# Patient Record
Sex: Female | Born: 1955
Health system: Southern US, Community
[De-identification: ages and names within clinical notes are randomized; demographics above are authoritative.]

## PROBLEM LIST (undated history)

## (undated) DIAGNOSIS — I639 Cerebral infarction, unspecified: Secondary | ICD-10-CM

## (undated) DIAGNOSIS — G8929 Other chronic pain: Secondary | ICD-10-CM

## (undated) DIAGNOSIS — I10 Essential (primary) hypertension: Secondary | ICD-10-CM

## (undated) DIAGNOSIS — J302 Other seasonal allergic rhinitis: Secondary | ICD-10-CM

## (undated) DIAGNOSIS — Z862 Personal history of diseases of the blood and blood-forming organs and certain disorders involving the immune mechanism: Secondary | ICD-10-CM

## (undated) DIAGNOSIS — M549 Dorsalgia, unspecified: Secondary | ICD-10-CM

## (undated) HISTORY — PX: COLONOSCOPY: SHX174

## (undated) HISTORY — DX: Personal history of diseases of the blood and blood-forming organs and certain disorders involving the immune mechanism: Z86.2

## (undated) HISTORY — PX: LYMPH NODE BIOPSY: SHX201

## (undated) HISTORY — PX: FOOT SURGERY: SHX648

---

## 2004-05-22 ENCOUNTER — Emergency Department (HOSPITAL_COMMUNITY): Admission: EM | Admit: 2004-05-22 | Discharge: 2004-05-22 | Payer: Self-pay | Admitting: Emergency Medicine

## 2004-12-16 ENCOUNTER — Ambulatory Visit (HOSPITAL_COMMUNITY): Admission: RE | Admit: 2004-12-16 | Discharge: 2004-12-16 | Payer: Self-pay | Admitting: Family Medicine

## 2005-01-07 ENCOUNTER — Ambulatory Visit: Payer: Self-pay | Admitting: Internal Medicine

## 2005-01-27 ENCOUNTER — Ambulatory Visit: Payer: Self-pay | Admitting: Family Medicine

## 2005-01-29 ENCOUNTER — Ambulatory Visit: Payer: Self-pay | Admitting: Family Medicine

## 2006-12-19 ENCOUNTER — Emergency Department (HOSPITAL_COMMUNITY): Admission: EM | Admit: 2006-12-19 | Discharge: 2006-12-19 | Payer: Self-pay | Admitting: Emergency Medicine

## 2008-01-25 ENCOUNTER — Encounter: Admission: RE | Admit: 2008-01-25 | Discharge: 2008-01-25 | Payer: Self-pay | Admitting: Internal Medicine

## 2008-02-04 ENCOUNTER — Encounter (INDEPENDENT_AMBULATORY_CARE_PROVIDER_SITE_OTHER): Payer: Self-pay | Admitting: Internal Medicine

## 2009-01-15 ENCOUNTER — Ambulatory Visit (HOSPITAL_COMMUNITY): Admission: RE | Admit: 2009-01-15 | Discharge: 2009-01-15 | Payer: Self-pay | Admitting: Internal Medicine

## 2009-12-24 ENCOUNTER — Ambulatory Visit (HOSPITAL_COMMUNITY): Admission: RE | Admit: 2009-12-24 | Discharge: 2009-12-24 | Payer: Self-pay | Admitting: Internal Medicine

## 2010-01-04 ENCOUNTER — Encounter: Admission: RE | Admit: 2010-01-04 | Discharge: 2010-01-04 | Payer: Self-pay | Admitting: Internal Medicine

## 2010-01-22 ENCOUNTER — Emergency Department (HOSPITAL_COMMUNITY): Admission: EM | Admit: 2010-01-22 | Discharge: 2010-01-22 | Payer: Self-pay | Admitting: Family Medicine

## 2010-03-21 ENCOUNTER — Emergency Department (HOSPITAL_COMMUNITY): Admission: EM | Admit: 2010-03-21 | Discharge: 2010-03-21 | Payer: Self-pay | Admitting: Emergency Medicine

## 2010-04-13 ENCOUNTER — Emergency Department (HOSPITAL_COMMUNITY): Admission: EM | Admit: 2010-04-13 | Discharge: 2010-04-13 | Payer: Self-pay | Admitting: Emergency Medicine

## 2010-04-19 ENCOUNTER — Ambulatory Visit (HOSPITAL_COMMUNITY): Admission: RE | Admit: 2010-04-19 | Discharge: 2010-04-19 | Payer: Self-pay | Admitting: Internal Medicine

## 2010-11-03 ENCOUNTER — Encounter: Payer: Self-pay | Admitting: Internal Medicine

## 2011-01-22 ENCOUNTER — Other Ambulatory Visit: Payer: Self-pay | Admitting: Internal Medicine

## 2011-01-22 DIAGNOSIS — Z1231 Encounter for screening mammogram for malignant neoplasm of breast: Secondary | ICD-10-CM

## 2011-02-03 ENCOUNTER — Ambulatory Visit
Admission: RE | Admit: 2011-02-03 | Discharge: 2011-02-03 | Disposition: A | Discharge status: Home / Self Care | Payer: Commercial Managed Care - PPO | Source: Ambulatory Visit | Attending: Internal Medicine | Admitting: Internal Medicine

## 2011-02-03 DIAGNOSIS — Z1231 Encounter for screening mammogram for malignant neoplasm of breast: Secondary | ICD-10-CM

## 2011-05-15 ENCOUNTER — Emergency Department (HOSPITAL_COMMUNITY)
Admission: EM | Admit: 2011-05-15 | Discharge: 2011-05-15 | Disposition: A | Discharge status: Home / Self Care | Payer: 59 | Attending: Emergency Medicine | Admitting: Emergency Medicine

## 2011-05-15 ENCOUNTER — Emergency Department (HOSPITAL_COMMUNITY): Payer: 59

## 2011-05-15 DIAGNOSIS — M545 Low back pain, unspecified: Secondary | ICD-10-CM | POA: Insufficient documentation

## 2011-05-15 DIAGNOSIS — I10 Essential (primary) hypertension: Secondary | ICD-10-CM | POA: Insufficient documentation

## 2011-05-15 LAB — URINALYSIS, ROUTINE W REFLEX MICROSCOPIC
Bilirubin Urine: NEGATIVE
Hgb urine dipstick: NEGATIVE
Nitrite: NEGATIVE
Protein, ur: NEGATIVE mg/dL
Specific Gravity, Urine: 1.017 (ref 1.005–1.030)
Urobilinogen, UA: 0.2 mg/dL (ref 0.0–1.0)

## 2011-05-26 ENCOUNTER — Other Ambulatory Visit (HOSPITAL_COMMUNITY): Payer: Self-pay | Admitting: Internal Medicine

## 2011-05-26 DIAGNOSIS — M545 Low back pain, unspecified: Secondary | ICD-10-CM

## 2011-05-28 ENCOUNTER — Ambulatory Visit (HOSPITAL_COMMUNITY)
Admission: RE | Admit: 2011-05-28 | Discharge: 2011-05-28 | Disposition: A | Discharge status: Home / Self Care | Payer: Commercial Managed Care - PPO | Source: Ambulatory Visit | Attending: Internal Medicine | Admitting: Internal Medicine

## 2011-05-28 DIAGNOSIS — M545 Low back pain, unspecified: Secondary | ICD-10-CM | POA: Insufficient documentation

## 2011-05-28 DIAGNOSIS — M5380 Other specified dorsopathies, site unspecified: Secondary | ICD-10-CM | POA: Insufficient documentation

## 2011-05-28 DIAGNOSIS — M25559 Pain in unspecified hip: Secondary | ICD-10-CM | POA: Insufficient documentation

## 2011-05-30 ENCOUNTER — Other Ambulatory Visit (HOSPITAL_COMMUNITY): Payer: Commercial Managed Care - PPO

## 2011-12-30 ENCOUNTER — Other Ambulatory Visit: Payer: Self-pay | Admitting: Internal Medicine

## 2011-12-30 DIAGNOSIS — Z1231 Encounter for screening mammogram for malignant neoplasm of breast: Secondary | ICD-10-CM

## 2012-02-13 ENCOUNTER — Ambulatory Visit
Admission: RE | Admit: 2012-02-13 | Discharge: 2012-02-13 | Disposition: A | Discharge status: Home / Self Care | Payer: 59 | Source: Ambulatory Visit | Attending: Internal Medicine | Admitting: Internal Medicine

## 2012-02-13 DIAGNOSIS — Z1231 Encounter for screening mammogram for malignant neoplasm of breast: Secondary | ICD-10-CM

## 2012-03-02 ENCOUNTER — Other Ambulatory Visit (HOSPITAL_COMMUNITY): Payer: Self-pay | Admitting: Nurse Practitioner

## 2012-03-02 ENCOUNTER — Ambulatory Visit (HOSPITAL_COMMUNITY)
Admission: RE | Admit: 2012-03-02 | Discharge: 2012-03-02 | Disposition: A | Discharge status: Home / Self Care | Payer: 59 | Source: Ambulatory Visit | Attending: Nurse Practitioner | Admitting: Nurse Practitioner

## 2012-03-02 DIAGNOSIS — I517 Cardiomegaly: Secondary | ICD-10-CM | POA: Insufficient documentation

## 2012-03-02 DIAGNOSIS — R05 Cough: Secondary | ICD-10-CM | POA: Insufficient documentation

## 2012-03-02 DIAGNOSIS — R52 Pain, unspecified: Secondary | ICD-10-CM

## 2012-03-02 DIAGNOSIS — R059 Cough, unspecified: Secondary | ICD-10-CM | POA: Insufficient documentation

## 2012-03-02 DIAGNOSIS — R0989 Other specified symptoms and signs involving the circulatory and respiratory systems: Secondary | ICD-10-CM | POA: Insufficient documentation

## 2012-05-21 ENCOUNTER — Encounter: Payer: Self-pay | Admitting: Gastroenterology

## 2012-05-24 ENCOUNTER — Telehealth: Payer: Self-pay | Admitting: Gastroenterology

## 2012-05-24 NOTE — Telephone Encounter (Signed)
Forward 9 pages from [facility] to Dr. Sheryn Bison for review on 05-24-12 ym

## 2012-06-21 ENCOUNTER — Ambulatory Visit (AMBULATORY_SURGERY_CENTER): Payer: 59 | Admitting: *Deleted

## 2012-06-21 VITALS — Ht 68.0 in | Wt 172.2 lb

## 2012-06-21 DIAGNOSIS — Z1211 Encounter for screening for malignant neoplasm of colon: Secondary | ICD-10-CM

## 2012-06-21 MED ORDER — MOVIPREP 100 G PO SOLR: ORAL | Status: DC

## 2012-07-05 ENCOUNTER — Encounter: Payer: Self-pay | Admitting: Gastroenterology

## 2012-07-05 ENCOUNTER — Ambulatory Visit (AMBULATORY_SURGERY_CENTER): Payer: 59 | Admitting: Gastroenterology

## 2012-07-05 VITALS — BP 175/102 | HR 67 | Temp 97.3°F | Resp 17 | Ht 68.0 in | Wt 172.0 lb

## 2012-07-05 DIAGNOSIS — Z1211 Encounter for screening for malignant neoplasm of colon: Secondary | ICD-10-CM

## 2012-07-05 DIAGNOSIS — D126 Benign neoplasm of colon, unspecified: Secondary | ICD-10-CM

## 2012-07-05 MED ORDER — SODIUM CHLORIDE 0.9 % IV SOLN: 500.0000 mL | INTRAVENOUS | Status: DC

## 2012-07-05 NOTE — Op Note (Signed)
[facility] [address] [place], [address]   COLONOSCOPY PROCEDURE REPORT  PATIENT: Brittany, Crawford  MR#: [id number] BIRTHDATE: 11/12/1955 , 55  yrs. old GENDER: Female ENDOSCOPIST: Mardella Layman, MD, Clementeen Graham REFERRED BY:  Kellie Shropshire, M.D. PROCEDURE DATE:  07/05/2012 PROCEDURE:   Colonoscopy with biopsy ASA CLASS:   Class II INDICATIONS:average risk patient for colon cancer. MEDICATIONS: Propofol (Diprivan) 180 mg IV  DESCRIPTION OF PROCEDURE:   After the risks and benefits and of the procedure were explained, informed consent was obtained.  A digital rectal exam revealed no abnormalities of the rectum.    The LB CF-[id number]  endoscope was introduced through the anus and advanced to the cecum, which was identified by both the appendix and ileocecal valve .  The quality of the prep was excellent, using MoviPrep .  The instrument was then slowly withdrawn as the colon was fully examined.     COLON FINDINGS: A normal appearing cecum, ileocecal valve, and appendiceal orifice were identified.  The ascending, hepatic flexure, transverse, splenic flexure, descending, sigmoid colon and rectum appeared unremarkable.  No polyps or cancers were seen. Multiple diminutive small flat polyps were found in the rectum. Multiple biopsies were performed using a cold snare. Retroflexed views revealed no abnormalities.     The scope was then withdrawn from the patient and the procedure completed.  COMPLICATIONS: There were no complications. ENDOSCOPIC IMPRESSION: 1.   Normal colon 2.   Multiple diminutive small flat polyps were found in the rectum; multiple biopsies were performed using a cold snare .These appear to be hyperplastic polyps,,r/o adenpmas.  RECOMMENDATIONS: 1.  await pathology results 2.  Repeat colonoscopy in 5 years if polyp adenomatous; otherwise 10 years   REPEAT EXAM:  cc:  _______________________________ eSignedMardella Layman,  MD, [facility] 07/05/2012 11:14 AM

## 2012-07-05 NOTE — Progress Notes (Addendum)
PATIENT STATING HER BP IS ALWAYS UP WHEN HER BACK IS HURTING. PATIENT STATING SHE IS GOING TO PCP TOMORROW FOR PAIN MEDICATION AND BP CHECK. PATIENT STATING AT PRESENT NO PAIN. STRIP OF BPS GIVEN TO PATIENT FOR PCP.

## 2012-07-05 NOTE — Patient Instructions (Signed)
YOU HAD AN ENDOSCOPIC PROCEDURE TODAY AT THE [facility]: Refer to the procedure report that was given to you for any specific questions about what was found during the examination.  If the procedure report does not answer your questions, please call your gastroenterologist to clarify.  If you requested that your care partner not be given the details of your procedure findings, then the procedure report has been included in a sealed envelope for you to review at your convenience later.  YOU SHOULD EXPECT: Some feelings of bloating in the abdomen. Passage of more gas than usual.  Walking can help get rid of the air that was put into your GI tract during the procedure and reduce the bloating. If you had a lower endoscopy (such as a colonoscopy or flexible sigmoidoscopy) you may notice spotting of blood in your stool or on the toilet paper. If you underwent a bowel prep for your procedure, then you may not have a normal bowel movement for a few days.  DIET: Your first meal following the procedure should be a light meal and then it is ok to progress to your normal diet.  A half-sandwich or bowl of soup is an example of a good first meal.  Heavy or fried foods are harder to digest and may make you feel nauseous or bloated.  Likewise meals heavy in dairy and vegetables can cause extra gas to form and this can also increase the bloating.  Drink plenty of fluids but you should avoid alcoholic beverages for 24 hours.  ACTIVITY: Your care partner should take you home directly after the procedure.  You should plan to take it easy, moving slowly for the rest of the day.  You can resume normal activity the day after the procedure however you should NOT DRIVE or use heavy machinery for 24 hours (because of the sedation medicines used during the test).    SYMPTOMS TO REPORT IMMEDIATELY: A gastroenterologist can be reached at any hour.  During normal business hours, 8:30 AM to 5:00 PM Monday through Friday,  call [phone].  After hours and on weekends, please call the GI answering service at [phone] who will take a message and have the physician on call contact you.   Following lower endoscopy (colonoscopy or flexible sigmoidoscopy):  Excessive amounts of blood in the stool  Significant tenderness or worsening of abdominal pains  Swelling of the abdomen that is new, acute  Fever of 100F or higher    FOLLOW UP: If any biopsies were taken you will be contacted by phone or by letter within the next 1-3 weeks.  Call your gastroenterologist if you have not heard about the biopsies in 3 weeks.  Our staff will call the home number listed on your records the next business day following your procedure to check on you and address any questions or concerns that you may have at that time regarding the information given to you following your procedure. This is a courtesy call and so if there is no answer at the home number and we have not heard from you through the emergency physician on call, we will assume that you have returned to your regular daily activities without incident.  SIGNATURES/CONFIDENTIALITY: You and/or your care partner have signed paperwork which will be entered into your electronic medical record.  These signatures attest to the fact that that the information above on your After Visit Summary has been reviewed and is understood.  Full responsibility of the confidentiality   of this discharge information lies with you and/or your care-partner.     

## 2012-07-06 ENCOUNTER — Telehealth: Payer: Self-pay | Admitting: *Deleted

## 2012-07-06 NOTE — Telephone Encounter (Signed)
  Follow up Call-  Call back number 07/05/2012  Post procedure Call Back phone  # [phone]  Permission to leave phone message Yes     Patient questions:  Do you have a fever, pain , or abdominal swelling? no Pain Score  0 *  Have you tolerated food without any problems? yes  Have you been able to return to your normal activities? yes  Do you have any questions about your discharge instructions: Diet   no Medications  no Follow up visit  no  Do you have questions or concerns about your Care? no  Actions: * If pain score is 4 or above: No action needed, pain <4.

## 2012-07-13 ENCOUNTER — Other Ambulatory Visit (HOSPITAL_COMMUNITY): Payer: Self-pay | Admitting: Neurosurgery

## 2012-07-13 DIAGNOSIS — M47816 Spondylosis without myelopathy or radiculopathy, lumbar region: Secondary | ICD-10-CM

## 2012-07-13 DIAGNOSIS — M545 Low back pain, unspecified: Secondary | ICD-10-CM

## 2012-07-15 ENCOUNTER — Ambulatory Visit (HOSPITAL_COMMUNITY)
Admission: RE | Admit: 2012-07-15 | Discharge: 2012-07-15 | Disposition: A | Discharge status: Home / Self Care | Payer: 59 | Source: Ambulatory Visit | Attending: Neurosurgery | Admitting: Neurosurgery

## 2012-07-15 DIAGNOSIS — M545 Low back pain, unspecified: Secondary | ICD-10-CM

## 2012-07-15 DIAGNOSIS — M47817 Spondylosis without myelopathy or radiculopathy, lumbosacral region: Secondary | ICD-10-CM | POA: Insufficient documentation

## 2012-07-15 DIAGNOSIS — M47816 Spondylosis without myelopathy or radiculopathy, lumbar region: Secondary | ICD-10-CM

## 2012-07-19 ENCOUNTER — Encounter: Payer: Self-pay | Admitting: Internal Medicine

## 2012-11-01 ENCOUNTER — Emergency Department (HOSPITAL_COMMUNITY)
Admission: EM | Admit: 2012-11-01 | Discharge: 2012-11-01 | Disposition: A | Discharge status: Home / Self Care | Payer: 59 | Source: Home / Self Care | Attending: Emergency Medicine | Admitting: Emergency Medicine

## 2012-11-01 ENCOUNTER — Encounter (HOSPITAL_COMMUNITY): Payer: Self-pay | Admitting: *Deleted

## 2012-11-01 DIAGNOSIS — J029 Acute pharyngitis, unspecified: Secondary | ICD-10-CM

## 2012-11-01 LAB — POCT RAPID STREP A: Streptococcus, Group A Screen (Direct): NEGATIVE

## 2012-11-01 MED ORDER — CETIRIZINE HCL 10 MG PO TABS: 10.0000 mg | ORAL_TABLET | Freq: Every day | ORAL | Status: DC

## 2012-11-01 NOTE — ED Provider Notes (Signed)
History     CSN: [id number]  Arrival date & time 11/01/12  1047   First MD Initiated Contact with Patient 11/01/12 1321      Chief Complaint  Patient presents with  . Sore Throat    (Consider location/radiation/quality/duration/timing/severity/associated sxs/prior treatment) Patient is a 57 y.o. female presenting with pharyngitis. The history is provided by the patient.  Sore Throat This is a new problem. The current episode started more than 1 week ago. The problem occurs constantly. The problem has not changed (waxing and waning) since onset.The symptoms are aggravated by swallowing. Nothing relieves the symptoms.  Pt reports sore throat for greater than one month, intermittently relieved with soups and popiscles.  States she is currently on antibiotics for tooth infection but no relief in symptoms.    Past Medical History  Diagnosis Date  . H/O sarcoidosis     Past Surgical History  Procedure Date  . Foot surgery     left-pins placed  . Lymph node biopsy     Family History  Problem Relation Age of Onset  . Colon cancer Maternal Uncle   . Stroke Mother     History  Substance Use Topics  . Smoking status: Current Every Day Smoker    Types: Cigarettes  . Smokeless tobacco: Never Used     Comment: 1-2 cigarettes daily for years  . Alcohol Use: 1.2 oz/week    2 Cans of beer per week    OB History    Grav Para Term Preterm Abortions TAB SAB Ect Mult Living                  Review of Systems  Constitutional: Positive for fatigue.  HENT: Positive for ear pain, sore throat and neck pain.   Respiratory: Positive for cough.   All other systems reviewed and are negative.    Allergies  Review of patient's allergies indicates no known allergies.  Home Medications   Current Outpatient Rx  Name  Route  Sig  Dispense  Refill  . AMOXICILLIN 500 MG PO CAPS   Oral   Take 500 mg by mouth 3 (three) times daily.         Marland Kitchen HYDROCODONE-ACETAMINOPHEN 10-500 MG PO  TABS   Oral   Take 1 tablet by mouth as needed.         . WOMENS ONE DAILY PO   Oral   Take 1 tablet by mouth daily.         Marland Kitchen VITAMIN D (ERGOCALCIFEROL) 50000 UNITS PO CAPS   Oral   Take 50,000 Units by mouth every 7 (seven) days.         . MELOXICAM 15 MG PO TABS   Oral   Take 15 mg by mouth daily.           BP 195/106  Pulse 60  Temp 98 F (36.7 C) (Oral)  Resp 18  SpO2 98%  LMP 11/01/2011  Physical Exam  Nursing note and vitals reviewed. Constitutional: She is oriented to person, place, and time. Vital signs are normal. She appears well-developed and well-nourished. She is active and cooperative.  HENT:  Head: Normocephalic.  Right Ear: Tympanic membrane and external ear normal.  Left Ear: Tympanic membrane and external ear normal.  Nose: Nose normal. Right sinus exhibits no maxillary sinus tenderness and no frontal sinus tenderness. Left sinus exhibits no maxillary sinus tenderness and no frontal sinus tenderness.  Mouth/Throat: Uvula is midline and mucous membranes are normal. Posterior  oropharyngeal erythema present.  Eyes: Conjunctivae normal are normal. Pupils are equal, round, and reactive to light. No scleral icterus.  Neck: Trachea normal, normal range of motion, full passive range of motion without pain and phonation normal. Neck supple. No spinous process tenderness and no muscular tenderness present. No mass and no thyromegaly present.  Cardiovascular: Normal rate, regular rhythm, normal heart sounds and normal pulses.   Pulmonary/Chest: Effort normal and breath sounds normal.  Lymphadenopathy:       Head (right side): No submental, no submandibular, no tonsillar, no preauricular, no posterior auricular and no occipital adenopathy present.       Head (left side): Tonsillar adenopathy present. No submental, no submandibular, no preauricular, no posterior auricular and no occipital adenopathy present.    She has no cervical adenopathy.       Right: No  supraclavicular adenopathy present.       Left: No supraclavicular adenopathy present.  Neurological: She is alert and oriented to person, place, and time. No cranial nerve deficit or sensory deficit. GCS eye subscore is 4. GCS verbal subscore is 5. GCS motor subscore is 6.  Skin: Skin is warm and dry. No rash noted.  Psychiatric: She has a normal mood and affect. Her speech is normal and behavior is normal. Judgment and thought content normal. Cognition and memory are normal.    ED Course  Procedures (including critical care time)   Labs Reviewed  POCT RAPID STREP A ([facility] CARE ONLY)  POCT INFECTIOUS MONO SCREEN   No results found.   1. Pharyngitis       MDM  Rapid strep and mono screen negative.  Given course of pharyngitis and the fact that the patient is already on antibiotics, I believe discomfort is from allergic response.  Will recommend antihistamine and follow up with PCP for further evaluation.  Pt is hypertensive with no neuro or cardiac complaints, states she was previously on hypertensive medication but she stopped taking medication.  States she has an appointment with her PCP next week.  She plans to restart her blood pressure medication today, discussed reasons to seek immediate evaluation prior to PCP evaluation.          Johnsie Kindred, NP 11/01/12 1410

## 2012-11-01 NOTE — ED Provider Notes (Signed)
Medical screening examination/treatment/procedure(s) were performed by non-physician practitioner and as supervising physician I was immediately available for consultation/collaboration.  Leslee Home, M.D.   Reuben Likes, MD 11/01/12 [phone]

## 2012-11-01 NOTE — ED Notes (Signed)
Pt reports 1 month of sore throat and enlarged cervical nodes without fever that she knows.     She has taken tylenol and ibuprofen without relief.  She started taking amoxicillin 5 days ago for a dental problem.

## 2012-11-29 ENCOUNTER — Other Ambulatory Visit: Payer: Self-pay | Admitting: Obstetrics and Gynecology

## 2012-12-06 ENCOUNTER — Encounter (HOSPITAL_COMMUNITY): Payer: Self-pay | Admitting: Pharmacist

## 2012-12-10 ENCOUNTER — Encounter (HOSPITAL_COMMUNITY): Payer: Self-pay | Admitting: Anesthesiology

## 2012-12-10 ENCOUNTER — Ambulatory Visit (HOSPITAL_COMMUNITY): Payer: 59 | Admitting: Anesthesiology

## 2012-12-10 ENCOUNTER — Encounter (HOSPITAL_COMMUNITY): Payer: Self-pay | Admitting: *Deleted

## 2012-12-10 ENCOUNTER — Encounter (HOSPITAL_COMMUNITY)
Admission: RE | Disposition: A | Discharge status: Home / Self Care | Payer: Self-pay | Source: Ambulatory Visit | Attending: Obstetrics and Gynecology

## 2012-12-10 ENCOUNTER — Ambulatory Visit (HOSPITAL_COMMUNITY)
Admission: RE | Admit: 2012-12-10 | Discharge: 2012-12-10 | Disposition: A | Discharge status: Home / Self Care | Payer: 59 | Source: Ambulatory Visit | Attending: Obstetrics and Gynecology | Admitting: Obstetrics and Gynecology

## 2012-12-10 DIAGNOSIS — N938 Other specified abnormal uterine and vaginal bleeding: Secondary | ICD-10-CM | POA: Insufficient documentation

## 2012-12-10 DIAGNOSIS — N949 Unspecified condition associated with female genital organs and menstrual cycle: Secondary | ICD-10-CM | POA: Insufficient documentation

## 2012-12-10 DIAGNOSIS — N8501 Benign endometrial hyperplasia: Secondary | ICD-10-CM | POA: Insufficient documentation

## 2012-12-10 DIAGNOSIS — N840 Polyp of corpus uteri: Secondary | ICD-10-CM | POA: Insufficient documentation

## 2012-12-10 HISTORY — DX: Essential (primary) hypertension: I10

## 2012-12-10 HISTORY — PX: DILATATION & CURRETTAGE/HYSTEROSCOPY WITH RESECTOCOPE: SHX5572

## 2012-12-10 HISTORY — PX: POLYPECTOMY: SHX5525

## 2012-12-10 LAB — CBC
HCT: 39.7 % (ref 36.0–46.0)
Hemoglobin: 13.2 g/dL (ref 12.0–15.0)
RBC: 4.4 MIL/uL (ref 3.87–5.11)
WBC: 4.4 10*3/uL (ref 4.0–10.5)

## 2012-12-10 LAB — BASIC METABOLIC PANEL
Chloride: 105 mEq/L (ref 96–112)
GFR calc Af Amer: 78 mL/min — ABNORMAL LOW (ref >90)
Potassium: 3.7 mEq/L (ref 3.5–5.1)
Sodium: 139 mEq/L (ref 135–145)

## 2012-12-10 SURGERY — DILATATION & CURETTAGE/HYSTEROSCOPY WITH RESECTOCOPE
Anesthesia: General | Site: Vagina | Wound class: Clean Contaminated

## 2012-12-10 MED ORDER — FENTANYL CITRATE 0.05 MG/ML IJ SOLN: 25.0000 ug | INTRAMUSCULAR | Status: DC | PRN

## 2012-12-10 MED ORDER — CHLOROPROCAINE HCL 1 % IJ SOLN
INTRAMUSCULAR | Status: AC
  Filled 2012-12-10: qty 30

## 2012-12-10 MED ORDER — KETOROLAC TROMETHAMINE 30 MG/ML IJ SOLN
INTRAMUSCULAR | Status: AC
  Filled 2012-12-10: qty 2

## 2012-12-10 MED ORDER — ONDANSETRON HCL 4 MG/2ML IJ SOLN
INTRAMUSCULAR | Status: DC | PRN
  Administered 2012-12-10: 4 mg via INTRAVENOUS

## 2012-12-10 MED ORDER — LIDOCAINE HCL (CARDIAC) 20 MG/ML IV SOLN
INTRAVENOUS | Status: DC | PRN
Start: 2012-12-10 — End: 2012-12-10
  Administered 2012-12-10: 50 mg via INTRAVENOUS

## 2012-12-10 MED ORDER — LACTATED RINGERS IV SOLN
INTRAVENOUS | Status: DC
  Administered 2012-12-10: 125 mL/h via INTRAVENOUS

## 2012-12-10 MED ORDER — ONDANSETRON HCL 4 MG/2ML IJ SOLN: 4.0000 mg | Freq: Once | INTRAMUSCULAR | Status: DC | PRN

## 2012-12-10 MED ORDER — PROPOFOL 10 MG/ML IV EMUL
INTRAVENOUS | Status: AC
  Filled 2012-12-10: qty 20

## 2012-12-10 MED ORDER — MIDAZOLAM HCL 5 MG/5ML IJ SOLN
INTRAMUSCULAR | Status: DC | PRN
  Administered 2012-12-10: 2 mg via INTRAVENOUS

## 2012-12-10 MED ORDER — KETOROLAC TROMETHAMINE 30 MG/ML IJ SOLN: 15.0000 mg | Freq: Once | INTRAMUSCULAR | Status: DC | PRN

## 2012-12-10 MED ORDER — FENTANYL CITRATE 0.05 MG/ML IJ SOLN
INTRAMUSCULAR | Status: DC | PRN
  Administered 2012-12-10: 50 ug via INTRAVENOUS
  Administered 2012-12-10 (×2): 25 ug via INTRAVENOUS

## 2012-12-10 MED ORDER — FENTANYL CITRATE 0.05 MG/ML IJ SOLN
INTRAMUSCULAR | Status: AC
  Filled 2012-12-10: qty 2

## 2012-12-10 MED ORDER — ONDANSETRON HCL 4 MG/2ML IJ SOLN
INTRAMUSCULAR | Status: AC
  Filled 2012-12-10: qty 2

## 2012-12-10 MED ORDER — LIDOCAINE HCL (CARDIAC) 20 MG/ML IV SOLN
INTRAVENOUS | Status: AC
  Filled 2012-12-10: qty 5

## 2012-12-10 MED ORDER — KETOROLAC TROMETHAMINE 30 MG/ML IJ SOLN
INTRAMUSCULAR | Status: DC | PRN
  Administered 2012-12-10: 30 mg via INTRAVENOUS
  Administered 2012-12-10: 30 mg via INTRAMUSCULAR

## 2012-12-10 MED ORDER — DEXAMETHASONE SODIUM PHOSPHATE 10 MG/ML IJ SOLN
INTRAMUSCULAR | Status: DC | PRN
  Administered 2012-12-10: 10 mg via INTRAVENOUS

## 2012-12-10 MED ORDER — MEPERIDINE HCL 25 MG/ML IJ SOLN: 6.2500 mg | INTRAMUSCULAR | Status: DC | PRN

## 2012-12-10 MED ORDER — GLYCINE 1.5 % IR SOLN
Status: DC | PRN
  Administered 2012-12-10: 3000 mL

## 2012-12-10 MED ORDER — CHLOROPROCAINE HCL 1 % IJ SOLN
INTRAMUSCULAR | Status: DC | PRN
  Administered 2012-12-10: 20 mL

## 2012-12-10 MED ORDER — DEXAMETHASONE SODIUM PHOSPHATE 10 MG/ML IJ SOLN
INTRAMUSCULAR | Status: AC
  Filled 2012-12-10: qty 1

## 2012-12-10 MED ORDER — PROPOFOL 10 MG/ML IV BOLUS
INTRAVENOUS | Status: DC | PRN
  Administered 2012-12-10: 200 mg via INTRAVENOUS

## 2012-12-10 MED ORDER — MIDAZOLAM HCL 2 MG/2ML IJ SOLN
INTRAMUSCULAR | Status: AC
  Filled 2012-12-10: qty 2

## 2012-12-10 SURGICAL SUPPLY — 18 items
CANISTER SUCTION 2500CC (MISCELLANEOUS) ×2 IMPLANT
CATH ROBINSON RED A/P 16FR (CATHETERS) ×2 IMPLANT
CLOTH BEACON ORANGE TIMEOUT ST (SAFETY) ×2 IMPLANT
CONTAINER PREFILL 10% NBF 60ML (FORM) ×3 IMPLANT
DRESSING TELFA 8X3 (GAUZE/BANDAGES/DRESSINGS) ×2 IMPLANT
ELECT REM PT RETURN 9FT ADLT (ELECTROSURGICAL) ×2
ELECTRODE REM PT RTRN 9FT ADLT (ELECTROSURGICAL) ×1 IMPLANT
ELECTRODE ROLLER VERSAPOINT (ELECTRODE) IMPLANT
ELECTRODE RT ANGLE VERSAPOINT (CUTTING LOOP) IMPLANT
GLOVE BIO SURGEON STRL SZ 6.5 (GLOVE) ×2 IMPLANT
GLOVE BIOGEL PI IND STRL 7.0 (GLOVE) ×1 IMPLANT
GLOVE BIOGEL PI INDICATOR 7.0 (GLOVE) ×1
GOWN STRL REIN XL XLG (GOWN DISPOSABLE) ×4 IMPLANT
LOOP ANGLED CUTTING 22FR (CUTTING LOOP) ×1 IMPLANT
PACK HYSTEROSCOPY LF (CUSTOM PROCEDURE TRAY) ×2 IMPLANT
PAD OB MATERNITY 4.3X12.25 (PERSONAL CARE ITEMS) ×2 IMPLANT
TOWEL OR 17X24 6PK STRL BLUE (TOWEL DISPOSABLE) ×4 IMPLANT
WATER STERILE IRR 1000ML POUR (IV SOLUTION) ×2 IMPLANT

## 2012-12-10 NOTE — Anesthesia Postprocedure Evaluation (Signed)
  Anesthesia Post Note  Patient: Brittany Crawford  Procedure(s) Performed: Procedure(s) (LRB): DILATATION & CURETTAGE/HYSTEROSCOPY WITH RESECTOCOPE (N/A) POLYPECTOMY (N/A)  Anesthesia type: GA  Patient location: PACU  Post pain: Pain level controlled  Post assessment: Post-op Vital signs reviewed  Last Vitals:  Filed Vitals:   12/10/12 1230  BP: 128/83  Pulse: 63  Temp: 36.9 C  Resp: 16    Post vital signs: Reviewed  Level of consciousness: sedated  Complications: No apparent anesthesia complications

## 2012-12-10 NOTE — Brief Op Note (Signed)
12/10/2012  12:31 PM  PATIENT:  Brittany Crawford  57 y.o. female  PRE-OPERATIVE DIAGNOSIS:  Abnormal perimenopausal bleeding, Complex endometrial hyperplasia without atypia    POST-OPERATIVE DIAGNOSIS:  Abnormal perimenopausal bleeding, Complex endometrial hyperplasia without atypia    PROCEDURE:  DIAGNOSTIC HYSTEROSCOPY, HYSTEROSCOPIC RESECTION OF ENDOMETRIAL POLYP, d&c  SURGEON:  Surgeon(s) and Role:    * Myrtie Leuthold Cathie Beams, MD - Primary  PHYSICIAN ASSISTANT:   ASSISTANTS: none   ANESTHESIA:   general and paracervical block Findings: ENDOM POLYP POST WALL, TUBAL OSTIA SEEN, THIN ENDOMETRIUM EBL:  Total I/O In: 900 [I.V.:900] Out: 300 [Urine:300]  BLOOD ADMINISTERED:none  DRAINS: none   LOCAL MEDICATIONS USED:  OTHER NESICAINE  SPECIMEN:  Source of Specimen:  emc W/ POLYP  DISPOSITION OF SPECIMEN:  PATHOLOGY  COUNTS:  YES  TOURNIQUET:  * No tourniquets in log *  DICTATION: .Other Dictation: Dictation Number [id number]  PLAN OF CARE: Discharge to home after PACU  PATIENT DISPOSITION:  PACU - hemodynamically stable.   Delay start of Pharmacological VTE agent (>24hrs) due to surgical blood loss or risk of bleeding: no

## 2012-12-10 NOTE — Anesthesia Preprocedure Evaluation (Signed)
Anesthesia Evaluation  Patient identified by MRN, date of birth, ID band Patient awake    Reviewed: Allergy & Precautions, H&P , NPO status , Patient's Chart, lab work & pertinent test results  Airway Mallampati: I TM Distance: >3 FB Neck ROM: full    Dental no notable dental hx. (+) Teeth Intact   Pulmonary neg pulmonary ROS,    Pulmonary exam normal       Cardiovascular hypertension, Pt. on medications     Neuro/Psych negative neurological ROS  negative psych ROS   GI/Hepatic negative GI ROS, Neg liver ROS,   Endo/Other  negative endocrine ROS  Renal/GU negative Renal ROS  negative genitourinary   Musculoskeletal negative musculoskeletal ROS (+)   Abdominal Normal abdominal exam  (+)   Peds negative pediatric ROS (+)  Hematology negative hematology ROS (+)   Anesthesia Other Findings   Reproductive/Obstetrics negative OB ROS                           Anesthesia Physical Anesthesia Plan  ASA: II  Anesthesia Plan: General   Post-op Pain Management:    Induction: Intravenous  Airway Management Planned: LMA  Additional Equipment:   Intra-op Plan:   Post-operative Plan:   Informed Consent: I have reviewed the patients History and Physical, chart, labs and discussed the procedure including the risks, benefits and alternatives for the proposed anesthesia with the patient or authorized representative who has indicated his/her understanding and acceptance.     Plan Discussed with: CRNA and Surgeon  Anesthesia Plan Comments:         Anesthesia Quick Evaluation

## 2012-12-10 NOTE — Transfer of Care (Signed)
Immediate Anesthesia Transfer of Care Note  Patient: Brittany Crawford  Procedure(s) Performed: Procedure(s): DILATATION & CURETTAGE/HYSTEROSCOPY WITH RESECTOCOPE (N/A) POLYPECTOMY (N/A)  Patient Location: PACU  Anesthesia Type:General  Level of Consciousness: awake, alert  and oriented  Airway & Oxygen Therapy: Patient Spontanous Breathing and Patient connected to nasal cannula oxygen  Post-op Assessment: Report given to PACU RN  Post vital signs: Reviewed  Complications: No apparent anesthesia complications

## 2012-12-10 NOTE — Preoperative (Signed)
Beta Blockers   Reason not to administer Beta Blockers:Not Applicable 

## 2012-12-11 NOTE — Op Note (Signed)
Brittany Crawford, Brittany Crawford             ACCOUNT NO.:  [id number]  MEDICAL RECORD NO.:  [id number]  LOCATION:  [facility]                          FACILITY:  [facility]  PHYSICIAN:  Maxie Better, M.D.DATE OF BIRTH:  Mar 05, 1956  DATE OF PROCEDURE:  12/10/2012 DATE OF DISCHARGE:  12/10/2012                              OPERATIVE REPORT   PREOPERATIVE DIAGNOSES:  Abnormal perimenopausal bleeding, complex endometrial hyperplasia without atypia.  PROCEDURES:  Diagnostic hysteroscopy, hysteroscopic resection of endometrial polyp, dilation and curettage.  POSTOPERATIVE DIAGNOSES:  Abnormal perimenopausal bleeding, complex endometrial hyperplasia without atypia.  ANESTHESIA:  General, paracervical block.  SURGEON:  Maxie Better, M.D.  ASSISTANT:  None.  PROCEDURE:  Under general anesthesia, the patient was placed in the dorsal lithotomy position.  She was sterilely prepped and draped in usual fashion.  The bladder was catheterized for large amount of urine. Examination under anesthesia revealed a small anteverted uterus.  No adnexal masses could be appreciated.  A bivalve speculum was placed in the vagina.  A 20 mL of 1% Nesacaine was injected paracervically.  The single-tooth tenaculum was placed on the anterior lip of the cervix. The cervix was then serially dilated to #25 [employer] dilator.  A diagnostic hysteroscope was introduced into the uterine cavity.  Both tubal ostia's were seen.  The endometrial wall appeared thinned.  There was a polypoid lesion in the posterior wall.  The hysteroscope was removed.  The cavity was then curetted, but the tissue remained and therefore, the cervix was further dilated up to a #31 [employer] dilator and a resectoscope with a single loop was inserted.  The polyp was removed.  The resectoscope was then removed.  The cavity was then curetted for scant amount of tissue. All aspects of the procedure was felt to be completed at which time, all instruments were then  removed from the vagina.  SPECIMENS:  Endometrial curetting with polyps sent to Pathology.  ESTIMATED BLOOD LOSS:  Less than 15 mL.  COMPLICATIONS:  None.  The patient tolerated the procedure well, was transferred to recovery in stable condition.     Maxie Better, M.D.     /MEDQ  D:  12/10/2012  T:  12/11/2012  Job:  [id number]

## 2012-12-13 ENCOUNTER — Encounter (HOSPITAL_COMMUNITY): Payer: Self-pay | Admitting: Obstetrics and Gynecology

## 2013-05-02 ENCOUNTER — Other Ambulatory Visit: Payer: Self-pay

## 2013-05-02 DIAGNOSIS — Z1231 Encounter for screening mammogram for malignant neoplasm of breast: Secondary | ICD-10-CM

## 2013-05-09 ENCOUNTER — Ambulatory Visit
Admission: RE | Admit: 2013-05-09 | Discharge: 2013-05-09 | Disposition: A | Discharge status: Home / Self Care | Payer: 59 | Source: Ambulatory Visit

## 2013-05-09 DIAGNOSIS — Z1231 Encounter for screening mammogram for malignant neoplasm of breast: Secondary | ICD-10-CM

## 2013-05-21 ENCOUNTER — Emergency Department (HOSPITAL_COMMUNITY)
Admission: EM | Admit: 2013-05-21 | Discharge: 2013-05-21 | Disposition: A | Discharge status: Home / Self Care | Payer: 59 | Attending: Emergency Medicine | Admitting: Emergency Medicine

## 2013-05-21 ENCOUNTER — Encounter (HOSPITAL_COMMUNITY): Payer: Self-pay | Admitting: Emergency Medicine

## 2013-05-21 DIAGNOSIS — G8929 Other chronic pain: Secondary | ICD-10-CM | POA: Insufficient documentation

## 2013-05-21 DIAGNOSIS — I10 Essential (primary) hypertension: Secondary | ICD-10-CM | POA: Insufficient documentation

## 2013-05-21 DIAGNOSIS — Z862 Personal history of diseases of the blood and blood-forming organs and certain disorders involving the immune mechanism: Secondary | ICD-10-CM | POA: Insufficient documentation

## 2013-05-21 DIAGNOSIS — Z79899 Other long term (current) drug therapy: Secondary | ICD-10-CM | POA: Insufficient documentation

## 2013-05-21 DIAGNOSIS — M542 Cervicalgia: Secondary | ICD-10-CM | POA: Insufficient documentation

## 2013-05-21 DIAGNOSIS — F17200 Nicotine dependence, unspecified, uncomplicated: Secondary | ICD-10-CM | POA: Insufficient documentation

## 2013-05-21 DIAGNOSIS — Z791 Long term (current) use of non-steroidal anti-inflammatories (NSAID): Secondary | ICD-10-CM | POA: Insufficient documentation

## 2013-05-21 DIAGNOSIS — M549 Dorsalgia, unspecified: Secondary | ICD-10-CM | POA: Insufficient documentation

## 2013-05-21 DIAGNOSIS — Z8639 Personal history of other endocrine, nutritional and metabolic disease: Secondary | ICD-10-CM | POA: Insufficient documentation

## 2013-05-21 MED ORDER — DIAZEPAM 5 MG PO TABS: 5.0000 mg | ORAL_TABLET | Freq: Once | ORAL | Status: DC

## 2013-05-21 MED ORDER — HYDROMORPHONE HCL PF 1 MG/ML IJ SOLN
1.0000 mg | Freq: Once | INTRAMUSCULAR | Status: AC
  Administered 2013-05-21: 1 mg via INTRAMUSCULAR
  Filled 2013-05-21: qty 1

## 2013-05-21 MED ORDER — DIAZEPAM 2 MG PO TABS
2.0000 mg | ORAL_TABLET | Freq: Once | ORAL | Status: AC
  Administered 2013-05-21: 2 mg via ORAL
  Filled 2013-05-21: qty 1

## 2013-05-21 MED ORDER — DIAZEPAM 5 MG PO TABS: 5.0000 mg | ORAL_TABLET | Freq: Two times a day (BID) | ORAL | Status: DC | PRN

## 2013-05-21 NOTE — ED Provider Notes (Signed)
CSN: [id number]     Arrival date & time 05/21/13  4098 History     First MD Initiated Contact with Patient 05/21/13 0945     Chief Complaint  Patient presents with  . Neck Pain  . Back Pain   (Consider location/radiation/quality/duration/timing/severity/associated sxs/prior Treatment) HPI Comments: Patient is a 57 y/o female with a hx of low back pain x "years" and neck pain x 3 months who presents for worsening of her chronic pain. Patient states that pain has been worsening over the last week. She has tried Lortab and mobic without relief of symptoms. Patient states pain is worse in the AM upon waking and with prolonged periods of rest. Pain improves slightly with movement and stretching. Also states she used to get cortisone shots in her back which relieved her back pain, but she hasn't had them for a while. Patient was seen in orthopedist office yesterday and endorses f/u for further evaluation of symptoms. She denies new/recent falls or trauma to her neck or back as well as fevers, inability to ambulate, extremity weakness, and numbness/tingling.  PCP - Dr. Andi Devon; Orthopedist - Dr. Yevette Edwards  The history is provided by the patient. No language interpreter was used.    Past Medical History  Diagnosis Date  . H/O sarcoidosis   . Hypertension    Past Surgical History  Procedure Laterality Date  . Foot surgery      left-pins placed  . Lymph node biopsy    . Dilatation & currettage/hysteroscopy with resectocope N/A 12/10/2012    Procedure: DILATATION & CURETTAGE/HYSTEROSCOPY WITH RESECTOCOPE;  Surgeon: Serita Kyle, MD;  Location: [facility];  Service: Gynecology;  Laterality: N/A;  . Polypectomy N/A 12/10/2012    Procedure: POLYPECTOMY;  Surgeon: Serita Kyle, MD;  Location: [facility];  Service: Gynecology;  Laterality: N/A;   Family History  Problem Relation Age of Onset  . Colon cancer Maternal Uncle   . Stroke Mother    History  Substance Use Topics  .  Smoking status: Current Every Day Smoker -- 2.00 packs/day for 0 years    Types: Cigarettes  . Smokeless tobacco: Never Used     Comment: 1-2 cigarettes daily for years  . Alcohol Use: 1.2 oz/week    2 Cans of beer per week   OB History   Grav Para Term Preterm Abortions TAB SAB Ect Mult Living                 Review of Systems  Constitutional: Negative for fever.  HENT: Positive for neck pain.   Musculoskeletal: Positive for back pain. Negative for gait problem.  Skin: Negative for pallor and rash.  Neurological: Negative for weakness and numbness.  All other systems reviewed and are negative.   Allergies  Review of patient's allergies indicates no known allergies.  Home Medications   Current Outpatient Rx  Name  Route  Sig  Dispense  Refill  . cetirizine (ZYRTEC) 10 MG tablet   Oral   Take 1 tablet (10 mg total) by mouth daily.   30 tablet   0   . HYDROcodone-acetaminophen (LORTAB) 10-500 MG per tablet   Oral   Take 1 tablet by mouth every 6 (six) hours as needed for pain.          . meloxicam (MOBIC) 15 MG tablet   Oral   Take 15 mg by mouth daily.         . Multiple Vitamins-Minerals (WOMENS ONE DAILY PO)  Oral   Take 1 tablet by mouth daily.         . prednisoLONE acetate (PRED FORTE) 1 % ophthalmic suspension   Left Eye   Place 1 drop into the left eye as needed (flare ups).          . diazepam (VALIUM) 5 MG tablet   Oral   Take 1 tablet (5 mg total) by mouth every 12 (twelve) hours as needed for anxiety.   10 tablet   0    There were no vitals taken for this visit.  Physical Exam  Nursing note and vitals reviewed. Constitutional: She is oriented to person, place, and time. She appears well-developed and well-nourished. No distress.  HENT:  Head: Normocephalic and atraumatic.  Mouth/Throat: Oropharynx is clear and moist. No oropharyngeal exudate.  Eyes: Conjunctivae and EOM are normal. No scleral icterus.  Neck: Normal range of motion.  Neck supple.  Cardiovascular: Normal rate, regular rhythm and intact distal pulses.   Pulmonary/Chest: Effort normal. No respiratory distress.  Musculoskeletal: Normal range of motion.       Cervical back: She exhibits tenderness and spasm. She exhibits normal range of motion, no bony tenderness, no swelling, no edema and no laceration.       Lumbar back: She exhibits tenderness and bony tenderness. She exhibits normal range of motion, no laceration, no pain and no spasm.       Back:  TTP of cervical paraspinal muscles. No TTP of cervical midline. Patient with mild TTP of lumbar spine and paraspinal muscles. No bony deformities or step offs palpated. ROM of back normal.   Lymphadenopathy:    She has no cervical adenopathy.  Neurological: She is alert and oriented to person, place, and time.  No sensory or motor deficits appreciated. DTRs normal and symmetric. Patient ambulatory with normal gait and moves extremities without ataxia.  Skin: Skin is warm and dry. No rash noted. She is not diaphoretic. No erythema. No pallor.  Psychiatric: She has a normal mood and affect. Her behavior is normal.   ED Course   Procedures (including critical care time)  Labs Reviewed - No data to display No results found.  1. Chronic back pain   2. Chronic neck pain    MDM  Patient presents for chronic back and neck pain worsening over the last few weeks. Physical exam findings as above. Patient is ambulatory with normal gait and moves extremities without ataxia. She is neurovascularly intact. No new trauma or falls or injury to back or neck. No red flags or signs concerning for cauda equina. Patient reliable for followup with orthopedics as an outpatient; saw orthopedist yesterday for pain complaints and evaluation of symptoms. Patient treated in ED with IM Dilaudid and Valium with relief. Appropriate for discharge with orthopedic and primary care followup. Prescription for Valium given for neck spasm.  Indication for ED return discussed and patient agreeable to plan.  Antony Madura, PA-C 05/23/13 1459

## 2013-05-21 NOTE — ED Notes (Signed)
Pt c/o left neck pain into upper back that is chronic in nature; pt sts pain meds not helping

## 2013-05-21 NOTE — ED Notes (Signed)
[place] the city bus.

## 2013-05-24 NOTE — ED Provider Notes (Signed)
Medical screening examination/treatment/procedure(s) were performed by non-physician practitioner and as supervising physician I was immediately available for consultation/collaboration.  Flint Melter, MD 05/24/13 [phone]

## 2013-12-12 ENCOUNTER — Emergency Department (INDEPENDENT_AMBULATORY_CARE_PROVIDER_SITE_OTHER)
Admission: EM | Admit: 2013-12-12 | Discharge: 2013-12-12 | Disposition: A | Discharge status: Home / Self Care | Payer: Medicaid Other | Source: Home / Self Care | Attending: Emergency Medicine | Admitting: Emergency Medicine

## 2013-12-12 ENCOUNTER — Encounter (HOSPITAL_COMMUNITY): Payer: Self-pay | Admitting: Emergency Medicine

## 2013-12-12 DIAGNOSIS — I10 Essential (primary) hypertension: Secondary | ICD-10-CM

## 2013-12-12 DIAGNOSIS — J019 Acute sinusitis, unspecified: Secondary | ICD-10-CM

## 2013-12-12 DIAGNOSIS — J209 Acute bronchitis, unspecified: Secondary | ICD-10-CM

## 2013-12-12 MED ORDER — AMOXICILLIN-POT CLAVULANATE 875-125 MG PO TABS: 1.0000 | ORAL_TABLET | Freq: Two times a day (BID) | ORAL | Status: DC

## 2013-12-12 MED ORDER — AMLODIPINE BESYLATE 5 MG PO TABS: 5.0000 mg | ORAL_TABLET | Freq: Every day | ORAL | Status: DC

## 2013-12-12 MED ORDER — FLUTICASONE PROPIONATE 50 MCG/ACT NA SUSP: 2.0000 | Freq: Every day | NASAL | Status: DC

## 2013-12-12 NOTE — ED Provider Notes (Signed)
Chief Complaint   Chief Complaint  Patient presents with  . Sinus Problem    History of Present Illness   Brittany Crawford is a 58 year old female who's had a ten-day history of nasal congestion with brown drainage, sinus pressure, headache, itching ears, itchy, watery eyes, has felt hot and cold, and has had a cough productive of brown sputum. She denies any wheezing, chest pain, or GI symptoms. She has had no sick exposures.  She also mentions today that her blood pressure is very hard to control. She is currently taking telemesartan/HCTZ for her blood pressure. She doesn't think it is working. The last several times she's had a blood pressure check it's been high. She denies any shortness of breath or chest pain.  Review of Systems   Other than as noted above, the patient denies any of the following symptoms: Systemic:  No fevers, chills, sweats, or myalgias. Eye:  No redness or discharge. ENT:  No ear pain, headache, nasal congestion, drainage, sinus pressure, or sore throat. Neck:  No neck pain, stiffness, or swollen glands. Lungs:  No cough, sputum production, hemoptysis, wheezing, chest tightness, shortness of breath or chest pain. GI:  No abdominal pain, nausea, vomiting or diarrhea.  [place]   Past medical history, family history, social history, meds, and allergies were reviewed. She has no medication allergies. Her only other medication is hydrocodone. She has chronic back pain sarcoidosis. She goes to go for pain management and Dr. Heath Gold is her primary care physician.  Physical exam   Vital signs:  BP 181/102  Pulse 90  Temp(Src) 99.8 F (37.7 C) (Oral)  Resp 18  SpO2 98% General:  Alert and oriented.  In no distress.  Skin warm and dry. Eye:  No conjunctival injection or drainage. Lids were normal. ENT:  TMs and canals were normal, without erythema or inflammation.  Nasal mucosa was clear and uncongested, without drainage.  Mucous membranes were moist.  Pharynx  was clear with no exudate or drainage.  There were no oral ulcerations or lesions. Neck:  Supple, no adenopathy, tenderness or mass. Lungs:  No respiratory distress.  Lungs were clear to auscultation, without wheezes, rales or rhonchi.  Breath sounds were clear and equal bilaterally.  Heart:  Regular rhythm, without gallops, murmers or rubs. Skin:  Clear, warm, and dry, without rash or lesions.   Assessment     The primary encounter diagnosis was Acute sinusitis. Diagnoses of Acute bronchitis and Hypertension were also pertinent to this visit.  She will need something it additionally for her blood pressure, and amlodipine 5 mg was added. Suggested she stay away from decongestants.  Plan    1.  Meds:  The following meds were prescribed:   New Prescriptions   AMLODIPINE (NORVASC) 5 MG TABLET    Take 1 tablet (5 mg total) by mouth daily.   AMOXICILLIN-CLAVULANATE (AUGMENTIN) 875-125 MG PER TABLET    Take 1 tablet by mouth 2 (two) times daily.   FLUTICASONE (FLONASE) 50 MCG/ACT NASAL SPRAY    Place 2 sprays into both nostrils daily.    2.  Patient Education/Counseling:  The patient was given appropriate handouts, self care instructions, and instructed in symptomatic relief.  Instructed to get extra fluids, rest, and use a cool mist vaporizer.    3.  Follow up:  The patient was told to follow up here if no better in 3 to 4 days, or sooner if becoming worse in any way, and given some red flag symptoms  such as increasing fever, difficulty breathing, chest pain, or persistent vomiting which would prompt immediate return.  Follow up with Dr. Karlton Lemon for her blood pressure in 2 weeks.      Harden Mo, MD 12/12/13 1011

## 2013-12-12 NOTE — Discharge Instructions (Signed)
Sinusitis Sinusitis is redness, soreness, and swelling (inflammation) of the paranasal sinuses. Paranasal sinuses are air pockets within the bones of your face (beneath the eyes, the middle of the forehead, or above the eyes). In healthy paranasal sinuses, mucus is able to drain out, and air is able to circulate through them by way of your nose. However, when your paranasal sinuses are inflamed, mucus and air can become trapped. This can allow bacteria and other germs to grow and cause infection. Sinusitis can develop quickly and last only a short time (acute) or continue over a long period (chronic). Sinusitis that lasts for more than 12 weeks is considered chronic.  CAUSES  Causes of sinusitis include:  Allergies.  Structural abnormalities, such as displacement of the cartilage that separates your nostrils (deviated septum), which can decrease the air flow through your nose and sinuses and affect sinus drainage.  Functional abnormalities, such as when the small hairs (cilia) that line your sinuses and help remove mucus do not work properly or are not present. SYMPTOMS  Symptoms of acute and chronic sinusitis are the same. The primary symptoms are pain and pressure around the affected sinuses. Other symptoms include:  Upper toothache.  Earache.  Headache.  Bad breath.  Decreased sense of smell and taste.  A cough, which worsens when you are lying flat.  Fatigue.  Fever.  Thick drainage from your nose, which often is green and may contain pus (purulent).  Swelling and warmth over the affected sinuses. DIAGNOSIS  Your caregiver will perform a physical exam. During the exam, your caregiver may:  Look in your nose for signs of abnormal growths in your nostrils (nasal polyps).  Tap over the affected sinus to check for signs of infection.  View the inside of your sinuses (endoscopy) with a special imaging device with a light attached (endoscope), which is inserted into your  sinuses. If your caregiver suspects that you have chronic sinusitis, one or more of the following tests may be recommended:  Allergy tests.  Nasal culture A sample of mucus is taken from your nose and sent to a lab and screened for bacteria.  Nasal cytology A sample of mucus is taken from your nose and examined by your caregiver to determine if your sinusitis is related to an allergy. TREATMENT  Most cases of acute sinusitis are related to a viral infection and will resolve on their own within 10 days. Sometimes medicines are prescribed to help relieve symptoms (pain medicine, decongestants, nasal steroid sprays, or saline sprays).  However, for sinusitis related to a bacterial infection, your caregiver will prescribe antibiotic medicines. These are medicines that will help kill the bacteria causing the infection.  Rarely, sinusitis is caused by a fungal infection. In theses cases, your caregiver will prescribe antifungal medicine. For some cases of chronic sinusitis, surgery is needed. Generally, these are cases in which sinusitis recurs more than 3 times per year, despite other treatments. HOME CARE INSTRUCTIONS   Drink plenty of water. Water helps thin the mucus so your sinuses can drain more easily.  Use a humidifier.  Inhale steam 3 to 4 times a day (for example, sit in the bathroom with the shower running).  Apply a warm, moist washcloth to your face 3 to 4 times a day, or as directed by your caregiver.  Use saline nasal sprays to help moisten and clean your sinuses.  Take over-the-counter or prescription medicines for pain, discomfort, or fever only as directed by your caregiver. [place]  CARE IF:  You have increasing pain or severe headaches.  You have nausea, vomiting, or drowsiness.  You have swelling around your face.  You have vision problems.  You have a stiff neck.  You have difficulty breathing. MAKE SURE YOU:   Understand these  instructions.  Will watch your condition.  Will get help right away if you are not doing well or get worse. Document Released: 09/29/2005 Document Revised: 12/22/2011 Document Reviewed: 10/14/2011 [facility] Patient Information 2014 [place], [place].  Blood pressure over the ideal can put you at higher risk for stroke, heart disease, and kidney failure.  For this reason, it's important to try to get your blood pressure as close as possible to the ideal.  The ideal blood pressure is 120/80.  Blood pressures from 932-355 systolic over 73-22 diastolic are labeled as "prehypertension."  This means you are at higher risk of developing hypertension in the future.  Blood pressures in this range are not treated with medication, but lifestyle changes are recommended to prevent progression to hypertension.  Blood pressures of 025 and above systolic over 90 and above diastolic are classified as hypertension and are treated with medications.  Lifestyle changes which can benefit both prehypertension and hypertension include the following:   Salt and sodium restriction.  Weight loss.  Regular exercise.  Avoidance of tobacco.  Avoidance of excess alcohol.  The "D.A.S.H" diet.   People with hypertension and prehypertension should limit their salt intake to less than 1500 mg daily.  Reading the nutrition information on the label of many prepared foods can give you an idea of how much sodium you're consuming at each meal.  Remember that the most important number on the nutrition information is the serving size.  It may be smaller than you think.  Try to avoid adding extra salt at the table.  You may add small amounts of salt while cooking.  Remember that salt is an acquired taste and you may get used to a using a whole lot less salt than you are using now.  Using less salt lets the food's natural flavors come through.  You might want to consider using salt substitutes, potassium chloride, pepper, or blends of  herbs and spices to enhance the flavor of your food.  Foods that contain the most salt include: processed meats (like ham, bacon, lunch meat, sausage, hot dogs, and breakfast meat), chips, pretzels, salted nuts, soups, salty snacks, canned foods, junk food, fast food, restaurant food, mustard, pickles, pizza, popcorn, soy sauce, and worcestershire sauce--quite a list!  You might ask, "Is there anything I can eat?"  The answer is, "yes."  Fruits and vegetables are usually low in salt.  Fresh is better than frozen which is better than canned.  If you have canned vegetables, you can cut down on the salt content by rinsing them in tap water 3 times before cooking.     Weight loss is the second thing you can do to lower your blood pressure.  Getting to and maintaining ideal weight will often normalize your blood pressure and allow you to avoid medications, entirely, cut way down on your dosage of medications, or allow to wean off your meds.  (Note, this should only be done under the supervision of your primary care doctor.)  Of course, weight loss takes time and you may need to be on medication in the meantime.  You shoot for a body mass index of 20-25.  When you go to the urgent care or to  your primary care doctor, they should calculate your BMI.  If you don't know what it is, ask.  You can calculate your BMI with the following formula:  Weight in pounds x 703/ (height in inches) x (height in inches).  There are many good diets out there: Weight Watchers and the D.A.S.H. Diet are the best, but often, just modifying a few factors can be helpful:  Don't skip meals, don't eat out, and keeping a food diary.  I do not recommend fad diets or diet pills which often raise blood pressure.    Everyone should get regular exercise, but this is particularly important for people with high blood pressure.  Just about any exercise is good.  The only exercise which may be harmful is lifting extreme heavy weights.  I recommend  moderate exercise such as walking for 30 minutes 5 days a week.  Going to the gym for a 50 minute workout 3 times a week is also good.  This amounts to 150 minutes of exercise weekly.   Anyone with high blood pressure should avoid any use of tobacco.  Tobacco use does not elevate blood pressure, but it increases the risk of heart disease and stroke.  If you are interested in quitting, discuss with your doctor how to quit.  If you are not interested in quitting, ask yourself, "What would my life be like in 10 years if I continue to smoke?"  "How will I know when it is time to quit?"  "How would my life be better if I were to quit."   Excess alcohol intake can raise the blood pressure.  The safe alcohol intake is 2 drinks or less per day for men and 1 drink per day or less for women.   There is a very good diet which I recommend that has been designed for people with blood pressure called the D.A.S.H. Diet (dietary approaches to stop hypertension).  It consists of fruits, vegetables, lean meats, low fat dairy, whole grains, nuts and seeds.  It is very low in salt and sodium.  It has also been found to have other beneficial health effects such as lowering cholesterol and helping lose weight.  It has been developed by the [employer] and can be downloaded from the internet without any cost. Just do a Development worker, community on "D.A.S.H. Diet." or go the NIH website (MasterBoxes.it).  There are also cookbooks and diet plans that can be gotten from [place] to help you with this diet.

## 2013-12-12 NOTE — ED Notes (Signed)
C/o  Head congestion.  Sinus pressure and pain.  Cough.   Itchy eyes and ears.  Nasal congestion.   Chills   X 10 days.  No otc meds taken for symptoms.  Denies fever, n/v/d

## 2014-01-25 ENCOUNTER — Ambulatory Visit (HOSPITAL_BASED_OUTPATIENT_CLINIC_OR_DEPARTMENT_OTHER): Payer: Medicaid Other

## 2014-02-28 ENCOUNTER — Ambulatory Visit (HOSPITAL_BASED_OUTPATIENT_CLINIC_OR_DEPARTMENT_OTHER): Payer: Medicaid Other | Attending: Physical Medicine and Rehabilitation

## 2014-03-07 ENCOUNTER — Encounter (HOSPITAL_COMMUNITY): Payer: Self-pay | Admitting: Emergency Medicine

## 2014-03-07 ENCOUNTER — Emergency Department (HOSPITAL_COMMUNITY)
Admission: EM | Admit: 2014-03-07 | Discharge: 2014-03-07 | Disposition: A | Discharge status: Home / Self Care | Payer: Medicaid Other | Attending: Emergency Medicine | Admitting: Emergency Medicine

## 2014-03-07 ENCOUNTER — Emergency Department (HOSPITAL_COMMUNITY): Payer: Medicaid Other

## 2014-03-07 DIAGNOSIS — R05 Cough: Secondary | ICD-10-CM | POA: Insufficient documentation

## 2014-03-07 DIAGNOSIS — Z862 Personal history of diseases of the blood and blood-forming organs and certain disorders involving the immune mechanism: Secondary | ICD-10-CM | POA: Insufficient documentation

## 2014-03-07 DIAGNOSIS — R110 Nausea: Secondary | ICD-10-CM | POA: Insufficient documentation

## 2014-03-07 DIAGNOSIS — I10 Essential (primary) hypertension: Secondary | ICD-10-CM | POA: Insufficient documentation

## 2014-03-07 DIAGNOSIS — Z8639 Personal history of other endocrine, nutritional and metabolic disease: Secondary | ICD-10-CM | POA: Insufficient documentation

## 2014-03-07 DIAGNOSIS — R059 Cough, unspecified: Secondary | ICD-10-CM | POA: Insufficient documentation

## 2014-03-07 DIAGNOSIS — R51 Headache: Secondary | ICD-10-CM | POA: Insufficient documentation

## 2014-03-07 DIAGNOSIS — R52 Pain, unspecified: Secondary | ICD-10-CM

## 2014-03-07 DIAGNOSIS — R350 Frequency of micturition: Secondary | ICD-10-CM | POA: Insufficient documentation

## 2014-03-07 DIAGNOSIS — R0981 Nasal congestion: Secondary | ICD-10-CM

## 2014-03-07 DIAGNOSIS — F17200 Nicotine dependence, unspecified, uncomplicated: Secondary | ICD-10-CM | POA: Insufficient documentation

## 2014-03-07 DIAGNOSIS — R509 Fever, unspecified: Secondary | ICD-10-CM | POA: Insufficient documentation

## 2014-03-07 DIAGNOSIS — J309 Allergic rhinitis, unspecified: Secondary | ICD-10-CM | POA: Insufficient documentation

## 2014-03-07 DIAGNOSIS — R630 Anorexia: Secondary | ICD-10-CM | POA: Insufficient documentation

## 2014-03-07 DIAGNOSIS — R000 Tachycardia, unspecified: Secondary | ICD-10-CM | POA: Insufficient documentation

## 2014-03-07 DIAGNOSIS — J3489 Other specified disorders of nose and nasal sinuses: Secondary | ICD-10-CM | POA: Insufficient documentation

## 2014-03-07 DIAGNOSIS — R42 Dizziness and giddiness: Secondary | ICD-10-CM | POA: Insufficient documentation

## 2014-03-07 HISTORY — DX: Other seasonal allergic rhinitis: J30.2

## 2014-03-07 LAB — RAPID STREP SCREEN (MED CTR MEBANE ONLY): STREPTOCOCCUS, GROUP A SCREEN (DIRECT): NEGATIVE

## 2014-03-07 LAB — COMPREHENSIVE METABOLIC PANEL
ALBUMIN: 3.8 g/dL (ref 3.5–5.2)
ALT: 27 U/L (ref 0–35)
AST: 29 U/L (ref 0–37)
Alkaline Phosphatase: 70 U/L (ref 39–117)
BILIRUBIN TOTAL: 0.5 mg/dL (ref 0.3–1.2)
BUN: 13 mg/dL (ref 6–23)
CHLORIDE: 102 meq/L (ref 96–112)
CO2: 25 mEq/L (ref 19–32)
CREATININE: 1.08 mg/dL (ref 0.50–1.10)
Calcium: 9.9 mg/dL (ref 8.4–10.5)
GFR calc Af Amer: 66 mL/min — ABNORMAL LOW (ref >90)
GFR calc non Af Amer: 57 mL/min — ABNORMAL LOW (ref >90)
Glucose, Bld: 110 mg/dL — ABNORMAL HIGH (ref 70–99)
Potassium: 3.8 mEq/L (ref 3.7–5.3)
SODIUM: 140 meq/L (ref 137–147)
Total Protein: 7.8 g/dL (ref 6.0–8.3)

## 2014-03-07 LAB — CBC WITH DIFFERENTIAL/PLATELET
BASOS ABS: 0 10*3/uL (ref 0.0–0.1)
BASOS PCT: 0 % (ref 0–1)
Eosinophils Absolute: 0.2 10*3/uL (ref 0.0–0.7)
Eosinophils Relative: 2 % (ref 0–5)
HEMATOCRIT: 38.1 % (ref 36.0–46.0)
Hemoglobin: 13.1 g/dL (ref 12.0–15.0)
Lymphocytes Relative: 29 % (ref 12–46)
Lymphs Abs: 2.2 10*3/uL (ref 0.7–4.0)
MCH: 30.6 pg (ref 26.0–34.0)
MCHC: 34.4 g/dL (ref 30.0–36.0)
MCV: 89 fL (ref 78.0–100.0)
MONO ABS: 0.5 10*3/uL (ref 0.1–1.0)
Monocytes Relative: 7 % (ref 3–12)
NEUTROS ABS: 4.6 10*3/uL (ref 1.7–7.7)
NEUTROS PCT: 62 % (ref 43–77)
PLATELETS: 206 10*3/uL (ref 150–400)
RBC: 4.28 MIL/uL (ref 3.87–5.11)
RDW: 14.1 % (ref 11.5–15.5)
WBC: 7.5 10*3/uL (ref 4.0–10.5)

## 2014-03-07 LAB — URINALYSIS, ROUTINE W REFLEX MICROSCOPIC
Bilirubin Urine: NEGATIVE
GLUCOSE, UA: NEGATIVE mg/dL
KETONES UR: NEGATIVE mg/dL
Nitrite: NEGATIVE
PH: 6.5 (ref 5.0–8.0)
Protein, ur: NEGATIVE mg/dL
Specific Gravity, Urine: 1.011 (ref 1.005–1.030)
Urobilinogen, UA: 0.2 mg/dL (ref 0.0–1.0)

## 2014-03-07 LAB — I-STAT CG4 LACTIC ACID, ED: Lactic Acid, Venous: 1.03 mmol/L (ref 0.5–2.2)

## 2014-03-07 LAB — URINE MICROSCOPIC-ADD ON

## 2014-03-07 MED ORDER — SODIUM CHLORIDE 0.9 % IV BOLUS (SEPSIS)
1000.0000 mL | Freq: Once | INTRAVENOUS | Status: AC
  Administered 2014-03-07: 1000 mL via INTRAVENOUS

## 2014-03-07 MED ORDER — DEXTROSE 5 % IV SOLN
1.0000 g | Freq: Once | INTRAVENOUS | Status: AC
  Administered 2014-03-07: 1 g via INTRAVENOUS
  Filled 2014-03-07: qty 10

## 2014-03-07 MED ORDER — ACETAMINOPHEN 325 MG PO TABS
650.0000 mg | ORAL_TABLET | Freq: Once | ORAL | Status: AC
  Administered 2014-03-07: 650 mg via ORAL

## 2014-03-07 NOTE — ED Provider Notes (Signed)
CSN: [id number]     Arrival date & time 03/07/14  0212 History   First MD Initiated Contact with Patient 03/07/14 0244     Chief Complaint  Patient presents with  . Generalized Body Aches     (Consider location/radiation/quality/duration/timing/severity/associated sxs/prior Treatment) HPI Comments: 58 year old female with sarcoidosis not on treatment, high blood pressure, allergies presents with fever and chills for the past 2 days. Patient has had sinus congestion, mild cough and bodyaches gradually worsening. Patient has had mild urinary frequency. No sick contacts current antibiotics or recent travel. No neck stiffness or severe headache. Symptoms intermittent.  The history is provided by the patient.    Past Medical History  Diagnosis Date  . H/O sarcoidosis   . Hypertension   . Seasonal allergies    Past Surgical History  Procedure Laterality Date  . Foot surgery      left-pins placed  . Lymph node biopsy    . Dilatation & currettage/hysteroscopy with resectocope N/A 12/10/2012    Procedure: DILATATION & CURETTAGE/HYSTEROSCOPY WITH RESECTOCOPE;  Surgeon: Marvene Staff, MD;  Location: [facility];  Service: Gynecology;  Laterality: N/A;  . Polypectomy N/A 12/10/2012    Procedure: POLYPECTOMY;  Surgeon: Marvene Staff, MD;  Location: [facility];  Service: Gynecology;  Laterality: N/A;   Family History  Problem Relation Age of Onset  . Colon cancer Maternal Uncle   . Stroke Mother    History  Substance Use Topics  . Smoking status: Current Every Day Smoker -- 2.00 packs/day for 0 years    Types: Cigarettes  . Smokeless tobacco: Never Used     Comment: 1-2 cigarettes daily for years  . Alcohol Use: 1.2 oz/week    2 Cans of beer per week   OB History   Grav Para Term Preterm Abortions TAB SAB Ect Mult Living                 Review of Systems  Constitutional: Positive for fever, chills and appetite change.  HENT: Positive for congestion.   Eyes: Negative for  visual disturbance.  Respiratory: Positive for cough. Negative for shortness of breath.   Cardiovascular: Negative for chest pain.  Gastrointestinal: Positive for nausea. Negative for vomiting and abdominal pain.  Genitourinary: Positive for frequency. Negative for dysuria and flank pain.  Musculoskeletal: Negative for back pain, neck pain and neck stiffness.  Skin: Negative for rash.  Neurological: Positive for light-headedness and headaches.      Allergies  Review of patient's allergies indicates no known allergies.  Home Medications   Prior to Admission medications   Medication Sig Start Date End Date Taking? Authorizing Provider  amLODipine (NORVASC) 5 MG tablet Take 1 tablet (5 mg total) by mouth daily. 12/12/13  Yes Harden Mo, MD  cetirizine (ZYRTEC) 10 MG tablet Take 1 tablet (10 mg total) by mouth daily. 11/01/12  Yes Awilda Metro, NP  fluticasone (FLONASE) 50 MCG/ACT nasal spray Place 2 sprays into both nostrils daily as needed for allergies or rhinitis.   Yes Historical Provider, MD  HYDROcodone-acetaminophen (NORCO) 7.5-325 MG per tablet Take 1 tablet by mouth every 6 (six) hours as needed for moderate pain.   Yes Historical Provider, MD  meloxicam (MOBIC) 15 MG tablet Take 15 mg by mouth daily as needed for pain.    Yes Historical Provider, MD  Multiple Vitamins-Minerals (WOMENS ONE DAILY PO) Take 1 tablet by mouth daily.   Yes Historical Provider, MD  prednisoLONE acetate (PRED FORTE) 1 %  ophthalmic suspension Place 1 drop into the left eye as needed (flare ups).    Yes Historical Provider, MD   BP 163/92  Pulse 94  Temp(Src) 99.3 F (37.4 C) (Oral)  Resp 14  Ht 5\' 8"  (1.727 m)  Wt 185 lb 8 oz (84.142 kg)  BMI 28.21 kg/m2  SpO2 95% Physical Exam  Nursing note and vitals reviewed. Constitutional: She is oriented to person, place, and time. She appears well-developed and well-nourished.  HENT:  Head: Normocephalic and atraumatic.  Congested with mild  maxillary sinus tenderness  Eyes: Conjunctivae are normal. Right eye exhibits no discharge. Left eye exhibits no discharge.  Neck: Normal range of motion. Neck supple. No tracheal deviation present.  Cardiovascular: Regular rhythm.  Tachycardia present.   Pulmonary/Chest: Effort normal and breath sounds normal.  Abdominal: Soft. She exhibits no distension. There is no tenderness. There is no guarding.  Musculoskeletal: She exhibits no edema.  Neurological: She is alert and oriented to person, place, and time.  Skin: Skin is warm. No rash noted.  Psychiatric: She has a normal mood and affect.    ED Course  Procedures (including critical care time) Labs Review Labs Reviewed  COMPREHENSIVE METABOLIC PANEL - Abnormal; Notable for the following:    Glucose, Bld 110 (*)    GFR calc non Af Amer 57 (*)    GFR calc Af Amer 66 (*)    All other components within normal limits  URINALYSIS, ROUTINE W REFLEX MICROSCOPIC - Abnormal; Notable for the following:    Hgb urine dipstick SMALL (*)    Leukocytes, UA TRACE (*)    All other components within normal limits  RAPID STREP SCREEN  CULTURE, GROUP A STREP  CBC WITH DIFFERENTIAL  URINE MICROSCOPIC-ADD ON  I-STAT CG4 LACTIC ACID, ED    Imaging Review Dg Chest 2 View  03/07/2014   CLINICAL DATA:  Body, fevers and chills.  EXAM: CHEST  2 VIEW  COMPARISON:  DG CHEST 2 VIEW dated 03/02/2012  FINDINGS: Cardiac silhouette is upper limits of normal, mediastinal silhouette is unremarkable. Theron Arista densities in the lung bases. The lungs are otherwise clear without pleural effusions or focal consolidations. Trachea projects midline and there is no pneumothorax. Soft tissue planes and included osseous structures are non-suspicious.  IMPRESSION: Minimal bibasilar atelectasis and borderline cardiomegaly.   Electronically Signed   By: Elon Alas   On: 03/07/2014 04:53     EKG Interpretation None      MDM   Final diagnoses:  Fever  Body aches   Sinus congestion   Healthy female presents with fever and multiple symptoms likely viral syndrome. No red flecks in history of present illness or exam. Patient well-appearing smiling in ER. With sepsis criteria Rocephin ordered in edition a chest x-ray on the urinalysis and strep test. Blood work reviewed unremarkable. Urine and strep unremarkable. Chest x-ray reviewed by myself personally in no acute findings.  Rechecking patient feels significantly improved and vitals have improved. Discussed close followup and reasons to return.  Results and differential diagnosis were discussed with the patient/parent/guardian. Close follow up outpatient was discussed, comfortable with the plan.   Filed Vitals:   03/07/14 0222 03/07/14 0339 03/07/14 0431  BP: 181/108 152/92 163/92  Pulse: 129 98 94  Temp: 103.2 F (39.6 C) 100.7 F (38.2 C) 99.3 F (37.4 C)  TempSrc: Oral Oral Oral  Resp: 16 14 14   Height: 5\' 8"  (1.727 m)    Weight: 185 lb 8 oz (84.142 kg)  SpO2: 100% 96% 95%      Mariea Clonts, MD 03/07/14 [phone]

## 2014-03-07 NOTE — ED Notes (Signed)
Pt states achs since Sunday evening and chills as well. Pt states hx of sarcordosis. Pt states wheezing as well.

## 2014-03-07 NOTE — Discharge Instructions (Signed)
If you were given medicines take as directed.  If you are on coumadin or contraceptives realize their levels and effectiveness is altered by many different medicines.  If you have any reaction (rash, tongues swelling, other) to the medicines stop taking and see a physician.   Please follow up as directed and return to the ER or see a physician for new or worsening symptoms.  Thank you. Filed Vitals:   03/07/14 0222 03/07/14 0339 03/07/14 0431  BP: 181/108 152/92 163/92  Pulse: 129 98 94  Temp: 103.2 F (39.6 C) 100.7 F (38.2 C) 99.3 F (37.4 C)  TempSrc: Oral Oral Oral  Resp: 16 14 14   Height: 5\' 8"  (1.727 m)    Weight: 185 lb 8 oz (84.142 kg)    SpO2: 100% 96% 95%

## 2014-03-07 NOTE — ED Notes (Signed)
Patient transported to X-ray 

## 2014-03-08 LAB — CULTURE, GROUP A STREP

## 2014-04-12 ENCOUNTER — Emergency Department (INDEPENDENT_AMBULATORY_CARE_PROVIDER_SITE_OTHER): Payer: Medicaid Other

## 2014-04-12 ENCOUNTER — Emergency Department (HOSPITAL_COMMUNITY)
Admission: EM | Admit: 2014-04-12 | Discharge: 2014-04-12 | Disposition: A | Discharge status: Home / Self Care | Payer: Medicaid Other | Source: Home / Self Care | Attending: Emergency Medicine | Admitting: Emergency Medicine

## 2014-04-12 ENCOUNTER — Encounter (HOSPITAL_COMMUNITY): Payer: Self-pay | Admitting: Emergency Medicine

## 2014-04-12 DIAGNOSIS — R509 Fever, unspecified: Secondary | ICD-10-CM

## 2014-04-12 LAB — CBC
HCT: 38.2 % (ref 36.0–46.0)
Hemoglobin: 12.9 g/dL (ref 12.0–15.0)
MCH: 29.9 pg (ref 26.0–34.0)
MCHC: 33.8 g/dL (ref 30.0–36.0)
MCV: 88.6 fL (ref 78.0–100.0)
PLATELETS: 262 10*3/uL (ref 150–400)
RBC: 4.31 MIL/uL (ref 3.87–5.11)
RDW: 14.1 % (ref 11.5–15.5)
WBC: 9.4 10*3/uL (ref 4.0–10.5)

## 2014-04-12 LAB — POCT URINALYSIS DIP (DEVICE)
Bilirubin Urine: NEGATIVE
Glucose, UA: NEGATIVE mg/dL
Ketones, ur: NEGATIVE mg/dL
NITRITE: NEGATIVE
Protein, ur: NEGATIVE mg/dL
Specific Gravity, Urine: 1.025 (ref 1.005–1.030)
Urobilinogen, UA: 0.2 mg/dL (ref 0.0–1.0)
pH: 6 (ref 5.0–8.0)

## 2014-04-12 MED ORDER — DOXYCYCLINE HYCLATE 100 MG PO CAPS: 100.0000 mg | ORAL_CAPSULE | Freq: Two times a day (BID) | ORAL | Status: DC

## 2014-04-12 NOTE — ED Notes (Signed)
Pt c/o fevers onset 4-5 days Reports it started w/cough x7-8 days Sx also include: HA, ABA, congestion, n/v, urinary freq, abd/back pain Seen at [facility] ER on 03/07/14; given antibiotics Alert w/no signs of acute distress.

## 2014-04-12 NOTE — Discharge Instructions (Signed)
Your chest xray was normal. Your urine studies were only notable for a small amount of blood. I have sent the specimen for culture and you will be notified by our clinic if culture results indicate the need for additional treatment. Your blood counts were normal. I have also sent studies for possible tick borne illnesses and if these results indicate the need for additional treatment, you will be also notified by phone. I would recommend taking doxycycline as prescribed to cover for possible tick borne illness and atypical respiratory infections causing cough and fever and if symptoms do not improve, you will need to follow up with your primary care doctor.  Fever, Adult A fever is a higher than normal body temperature. In an adult, an oral temperature around 98.6 F (37 C) is considered normal. A temperature of 100.4 F (38 C) or higher is generally considered a fever. Mild or moderate fevers generally have no long-term effects and often do not require treatment. Extreme fever (greater than or equal to 106 F or 41.1 C) can cause seizures. The sweating that may occur with repeated or prolonged fever may cause dehydration. Elderly people can develop confusion during a fever. A measured temperature can vary with:  Age.  Time of day.  Method of measurement (mouth, underarm, rectal, or ear). The fever is confirmed by taking a temperature with a thermometer. Temperatures can be taken different ways. Some methods are accurate and some are not.  An oral temperature is used most commonly. Electronic thermometers are fast and accurate.  An ear temperature will only be accurate if the thermometer is positioned as recommended by the manufacturer.  A rectal temperature is accurate and done for those adults who have a condition where an oral temperature cannot be taken.  An underarm (axillary) temperature is not accurate and not recommended. Fever is a symptom, not a disease.  CAUSES   Infections  commonly cause fever.  Some noninfectious causes for fever include:  Some arthritis conditions.  Some thyroid or adrenal gland conditions.  Some immune system conditions.  Some types of cancer.  A medicine reaction.  High doses of certain street drugs such as methamphetamine.  Dehydration.  Exposure to high outside or room temperatures.  Occasionally, the source of a fever cannot be determined. This is sometimes called a "fever of unknown origin" (FUO).  Some situations may lead to a temporary rise in body temperature that may go away on its own. Examples are:  Childbirth.  Surgery.  Intense exercise. HOME CARE INSTRUCTIONS   Take appropriate medicines for fever. Follow dosing instructions carefully. If you use acetaminophen to reduce the fever, be careful to avoid taking other medicines that also contain acetaminophen. Do not take aspirin for a fever if you are younger than age 24. There is an association with Reye's syndrome. Reye's syndrome is a rare but potentially deadly disease.  If an infection is present and antibiotics have been prescribed, take them as directed. Finish them even if you start to feel better.  Rest as needed.  Maintain an adequate fluid intake. To prevent dehydration during an illness with prolonged or recurrent fever, you may need to drink extra fluid.Drink enough fluids to keep your urine clear or pale yellow.  Sponging or bathing with room temperature water may help reduce body temperature. Do not use ice water or alcohol sponge baths.  Dress comfortably, but do not over-bundle. SEEK MEDICAL CARE IF:   You are unable to keep fluids down.  You develop  vomiting or diarrhea.  You are not feeling at least partly better after 3 days.  You develop new symptoms or problems. SEEK IMMEDIATE MEDICAL CARE IF:   You have shortness of breath or trouble breathing.  You develop excessive weakness.  You are dizzy or you faint.  You are extremely  thirsty or you are making little or no urine.  You develop new pain that was not there before (such as in the head, neck, chest, back, or abdomen).  You have persistant vomiting and diarrhea for more than 1 to 2 days.  You develop a stiff neck or your eyes become sensitive to light.  You develop a skin rash.  You have a fever or persistent symptoms for more than 2 to 3 days.  You have a fever and your symptoms suddenly get worse. MAKE SURE YOU:   Understand these instructions.  Will watch your condition.  Will get help right away if you are not doing well or get worse. Document Released: 03/25/2001 Document Revised: 12/22/2011 Document Reviewed: 07/31/2011 [facility] Patient Information 2015 [place], [place]. This information is not intended to replace advice given to you by your health care provider. Make sure you discuss any questions you have with your health care provider.

## 2014-04-12 NOTE — ED Provider Notes (Signed)
CSN: [id number]     Arrival date & time 04/12/14  1410 History   First MD Initiated Contact with Patient 04/12/14 1527     Chief Complaint  Patient presents with  . Fever   (Consider location/radiation/quality/duration/timing/severity/associated sxs/prior Treatment) HPI Comments: Reports having been seen for similar constellation of symptoms in the ER on 03-07-2014. No source of fever identified. Was treated with dose of Rocephin and discharged home with recommended follow up with her PCP. Denies rash, weight loss, night sweats, tick bites, foreign travel.  Reports migratory "sharp pains" throughout her body.   Patient is a 57 y.o. female presenting with fever. The history is provided by the patient.  Fever Severity:  Moderate Onset quality:  Gradual Duration:  1 day Timing:  Constant Progression:  Worsening Chronicity:  New (Mentions similar episode that occurred in May 2015) Associated symptoms: chills, congestion, cough, headaches and nausea   Associated symptoms: no chest pain, no confusion, no diarrhea, no dysuria, no ear pain, no myalgias, no rash, no rhinorrhea, no somnolence, no sore throat and no vomiting   Associated symptoms comment:  +urinary frequency (reports this to be a chronic issue)   Past Medical History  Diagnosis Date  . H/O sarcoidosis   . Hypertension   . Seasonal allergies    Past Surgical History  Procedure Laterality Date  . Foot surgery      left-pins placed  . Lymph node biopsy    . Dilatation & currettage/hysteroscopy with resectocope N/A 12/10/2012    Procedure: DILATATION & CURETTAGE/HYSTEROSCOPY WITH RESECTOCOPE;  Surgeon: Marvene Staff, MD;  Location: [facility];  Service: Gynecology;  Laterality: N/A;  . Polypectomy N/A 12/10/2012    Procedure: POLYPECTOMY;  Surgeon: Marvene Staff, MD;  Location: [facility];  Service: Gynecology;  Laterality: N/A;   Family History  Problem Relation Age of Onset  . Colon cancer Maternal Uncle   . Stroke  Mother    History  Substance Use Topics  . Smoking status: Current Every Day Smoker -- 2.00 packs/day for 0 years    Types: Cigarettes  . Smokeless tobacco: Never Used     Comment: 1-2 cigarettes daily for years  . Alcohol Use: 1.2 oz/week    2 Cans of beer per week   OB History   Grav Para Term Preterm Abortions TAB SAB Ect Mult Living                 Review of Systems  Constitutional: Positive for fever, chills and fatigue.  HENT: Positive for congestion. Negative for ear pain, rhinorrhea and sore throat.   Eyes: Negative.   Respiratory: Positive for cough.   Cardiovascular: Negative for chest pain.  Gastrointestinal: Positive for nausea and abdominal pain. Negative for vomiting, diarrhea and constipation.  Endocrine: Positive for polyuria. Negative for polydipsia and polyphagia.  Genitourinary: Positive for frequency. Negative for dysuria, urgency, hematuria, flank pain, decreased urine volume, vaginal bleeding, vaginal discharge, genital sores, vaginal pain, menstrual problem and pelvic pain.  Musculoskeletal: Negative for myalgias.  Skin: Negative for rash.  Neurological: Positive for headaches. Negative for dizziness, light-headedness and numbness.  Psychiatric/Behavioral: Negative for confusion.    Allergies  Review of patient's allergies indicates no known allergies.  Home Medications   Prior to Admission medications   Medication Sig Start Date End Date Taking? Authorizing Provider  amLODipine (NORVASC) 5 MG tablet Take 1 tablet (5 mg total) by mouth daily. 12/12/13  Yes Harden Mo, MD  cetirizine (ZYRTEC) 10 MG tablet  Take 1 tablet (10 mg total) by mouth daily. 11/01/12   Awilda Metro, NP  doxycycline (VIBRAMYCIN) 100 MG capsule Take 1 capsule (100 mg total) by mouth 2 (two) times daily. 04/12/14   Lahoma Rocker, PA  fluticasone [facility]) 50 MCG/ACT nasal spray Place 2 sprays into both nostrils daily as needed for allergies or rhinitis.    Historical  Provider, MD  HYDROcodone-acetaminophen (NORCO) 7.5-325 MG per tablet Take 1 tablet by mouth every 6 (six) hours as needed for moderate pain.    Historical Provider, MD  meloxicam (MOBIC) 15 MG tablet Take 15 mg by mouth daily as needed for pain.     Historical Provider, MD  Multiple Vitamins-Minerals (WOMENS ONE DAILY PO) Take 1 tablet by mouth daily.    Historical Provider, MD  prednisoLONE acetate (PRED FORTE) 1 % ophthalmic suspension Place 1 drop into the left eye as needed (flare ups).     Historical Provider, MD   BP 154/96  Pulse 110  Temp(Src) 100.9 F (38.3 C) (Oral)  Resp 18  SpO2 97%  LMP 11/13/2012 Physical Exam  Nursing note and vitals reviewed. Constitutional: She is oriented to person, place, and time. She appears well-developed and well-nourished. No distress.  HENT:  Head: Normocephalic and atraumatic.  Right Ear: Hearing, tympanic membrane, external ear and ear canal normal.  Left Ear: Hearing, tympanic membrane, external ear and ear canal normal.  Nose: Nose normal.  Mouth/Throat: Uvula is midline, oropharynx is clear and moist and mucous membranes are normal. No oral lesions.  Eyes: Conjunctivae are normal. Right eye exhibits no discharge. Left eye exhibits no discharge. No scleral icterus.  Neck: Normal range of motion. Neck supple.  Cardiovascular: Normal rate, regular rhythm and normal heart sounds.   Pulmonary/Chest: Effort normal and breath sounds normal. No respiratory distress. She has no wheezes.  Abdominal: Soft. Bowel sounds are normal.  Musculoskeletal: Normal range of motion. She exhibits no edema and no tenderness.  Lymphadenopathy:    She has no cervical adenopathy.  Neurological: She is alert and oriented to person, place, and time. No cranial nerve deficit.  Skin: Skin is warm and dry. No rash noted. No erythema.  Psychiatric: She has a normal mood and affect. Her behavior is normal.    ED Course  Procedures (including critical care time) Labs  Review Labs Reviewed  POCT URINALYSIS DIP (DEVICE) - Abnormal; Notable for the following:    Hgb urine dipstick MODERATE (*)    Leukocytes, UA TRACE (*)    All other components within normal limits  URINE CULTURE  CBC  ROCKY MTN SPOTTED FVR AB, IGM-BLOOD    Imaging Review Dg Chest 2 View  04/12/2014   CLINICAL DATA:  Cough.  Fever.  EXAM: CHEST  2 VIEW  COMPARISON:  03/07/2014  FINDINGS: Cardiac silhouette is normal in size. Aorta is mildly uncoiled and tortuous. No mediastinal or hilar masses or evidence of adenopathy.  Clear lungs.  No pleural effusion.  No pneumothorax.  The bony thorax is intact.  IMPRESSION: No active cardiopulmonary disease.   Electronically Signed   By: Lajean Manes M.D.   On: 04/12/2014 16:31     MDM   1. Fever of unknown origin   CBC normal.  RMSF pending. UA with trace LE and moderate hematuria. Will send for C&S CXR negative for acute illness.  Will treat with doxycycline for atypical respiratory illness and to provide empiric coverage for possible tick borne illness and advise close follow up with  her PCP should symptoms persist.    Annett Gula [place], [place] 04/13/14 1031

## 2014-04-13 LAB — ROCKY MTN SPOTTED FVR AB, IGM-BLOOD: RMSF IgM: 0.41 IV (ref 0.00–0.89)

## 2014-04-14 ENCOUNTER — Telehealth (HOSPITAL_COMMUNITY): Payer: Self-pay | Admitting: Emergency Medicine

## 2014-04-14 LAB — URINE CULTURE: Special Requests: NORMAL

## 2014-04-14 MED ORDER — CEPHALEXIN 500 MG PO CAPS: 500.0000 mg | ORAL_CAPSULE | Freq: Three times a day (TID) | ORAL | Status: DC

## 2014-04-14 NOTE — ED Notes (Signed)
Her urine culture grew out 30,000 Escherichia coli and which was sensitive to all antibiotics. She was given doxycycline for presumed and [facility] spotted fever, however her RMS titer was negative. I will have her stop the doxycycline and switch to cephalexin 500 mg #30, one 3 times a day for 10 days. This will be called into her [facility] pharmacy in [address]  Harden Mo, MD 04/14/14 1520

## 2014-04-14 NOTE — ED Provider Notes (Signed)
Medical screening examination/treatment/procedure(s) were performed by a resident physician or non-physician practitioner and as the supervising physician I was immediately available for consultation/collaboration.  Lynne Leader, MD    Gregor Hams, MD 04/14/14 [phone]

## 2014-04-14 NOTE — ED Notes (Addendum)
I called pt. and left a message to call.  Call 1. Brittany Crawford 04/14/2014 Pt. called back.  Pt. verified x 2 and given results.  Pt. told to stop the Doxycycline and finish all of the Keflex. F/u with PCP if any new or different symptoms or not better after the medication.  Pt. voiced understanding. Brittany Crawford 04/14/2014

## 2014-07-05 ENCOUNTER — Emergency Department (HOSPITAL_COMMUNITY)
Admission: EM | Admit: 2014-07-05 | Discharge: 2014-07-05 | Discharge status: Against Medical Advice | Payer: Medicaid Other | Attending: Emergency Medicine | Admitting: Emergency Medicine

## 2014-07-05 ENCOUNTER — Encounter (HOSPITAL_COMMUNITY): Payer: Self-pay | Admitting: Emergency Medicine

## 2014-07-05 DIAGNOSIS — G8929 Other chronic pain: Secondary | ICD-10-CM | POA: Diagnosis not present

## 2014-07-05 DIAGNOSIS — I10 Essential (primary) hypertension: Secondary | ICD-10-CM | POA: Diagnosis not present

## 2014-07-05 DIAGNOSIS — F17200 Nicotine dependence, unspecified, uncomplicated: Secondary | ICD-10-CM | POA: Diagnosis not present

## 2014-07-05 DIAGNOSIS — R209 Unspecified disturbances of skin sensation: Secondary | ICD-10-CM | POA: Insufficient documentation

## 2014-07-05 NOTE — ED Notes (Signed)
Pt states that she woke up Monday morning left sided facial and extremity  numbness. No neuro deficits. Pt has sensation in face just states that it is tingling. Alert and oriented x4

## 2014-07-05 NOTE — ED Notes (Signed)
Pt stating she can no longer wait.

## 2014-07-06 ENCOUNTER — Inpatient Hospital Stay (HOSPITAL_COMMUNITY): Payer: Medicaid Other

## 2014-07-06 ENCOUNTER — Encounter (HOSPITAL_COMMUNITY): Payer: Self-pay | Admitting: Emergency Medicine

## 2014-07-06 ENCOUNTER — Emergency Department (HOSPITAL_COMMUNITY): Payer: Medicaid Other

## 2014-07-06 ENCOUNTER — Inpatient Hospital Stay (HOSPITAL_COMMUNITY)
Admission: EM | Admit: 2014-07-06 | Discharge: 2014-07-08 | DRG: 066 | Disposition: A | Discharge status: Home / Self Care | Payer: Medicaid Other | Attending: Internal Medicine | Admitting: Internal Medicine

## 2014-07-06 DIAGNOSIS — E785 Hyperlipidemia, unspecified: Secondary | ICD-10-CM | POA: Diagnosis present

## 2014-07-06 DIAGNOSIS — R209 Unspecified disturbances of skin sensation: Secondary | ICD-10-CM | POA: Diagnosis present

## 2014-07-06 DIAGNOSIS — R29898 Other symptoms and signs involving the musculoskeletal system: Secondary | ICD-10-CM | POA: Diagnosis present

## 2014-07-06 DIAGNOSIS — M549 Dorsalgia, unspecified: Secondary | ICD-10-CM | POA: Diagnosis present

## 2014-07-06 DIAGNOSIS — F1210 Cannabis abuse, uncomplicated: Secondary | ICD-10-CM | POA: Diagnosis present

## 2014-07-06 DIAGNOSIS — Z823 Family history of stroke: Secondary | ICD-10-CM | POA: Diagnosis not present

## 2014-07-06 DIAGNOSIS — D869 Sarcoidosis, unspecified: Secondary | ICD-10-CM | POA: Diagnosis present

## 2014-07-06 DIAGNOSIS — G8929 Other chronic pain: Secondary | ICD-10-CM | POA: Diagnosis present

## 2014-07-06 DIAGNOSIS — I10 Essential (primary) hypertension: Secondary | ICD-10-CM | POA: Diagnosis present

## 2014-07-06 DIAGNOSIS — F17200 Nicotine dependence, unspecified, uncomplicated: Secondary | ICD-10-CM | POA: Diagnosis present

## 2014-07-06 DIAGNOSIS — E782 Mixed hyperlipidemia: Secondary | ICD-10-CM | POA: Diagnosis present

## 2014-07-06 DIAGNOSIS — I6350 Cerebral infarction due to unspecified occlusion or stenosis of unspecified cerebral artery: Secondary | ICD-10-CM | POA: Diagnosis present

## 2014-07-06 DIAGNOSIS — I639 Cerebral infarction, unspecified: Secondary | ICD-10-CM | POA: Diagnosis present

## 2014-07-06 DIAGNOSIS — Z7982 Long term (current) use of aspirin: Secondary | ICD-10-CM

## 2014-07-06 DIAGNOSIS — Z720 Tobacco use: Secondary | ICD-10-CM

## 2014-07-06 HISTORY — DX: Other chronic pain: G89.29

## 2014-07-06 HISTORY — DX: Dorsalgia, unspecified: M54.9

## 2014-07-06 LAB — RAPID URINE DRUG SCREEN, HOSP PERFORMED
AMPHETAMINES: NOT DETECTED
Barbiturates: NOT DETECTED
Benzodiazepines: NOT DETECTED
Cocaine: NOT DETECTED
Opiates: POSITIVE — AB
Tetrahydrocannabinol: POSITIVE — AB

## 2014-07-06 LAB — CBC WITH DIFFERENTIAL/PLATELET
Basophils Absolute: 0 10*3/uL (ref 0.0–0.1)
Basophils Relative: 0 % (ref 0–1)
Eosinophils Absolute: 0.1 10*3/uL (ref 0.0–0.7)
Eosinophils Relative: 2 % (ref 0–5)
HCT: 38.6 % (ref 36.0–46.0)
Hemoglobin: 13.2 g/dL (ref 12.0–15.0)
LYMPHS ABS: 2.2 10*3/uL (ref 0.7–4.0)
LYMPHS PCT: 46 % (ref 12–46)
MCH: 29.6 pg (ref 26.0–34.0)
MCHC: 34.2 g/dL (ref 30.0–36.0)
MCV: 86.5 fL (ref 78.0–100.0)
Monocytes Absolute: 0.5 10*3/uL (ref 0.1–1.0)
Monocytes Relative: 11 % (ref 3–12)
NEUTROS PCT: 41 % — AB (ref 43–77)
Neutro Abs: 1.9 10*3/uL (ref 1.7–7.7)
Platelets: 272 10*3/uL (ref 150–400)
RBC: 4.46 MIL/uL (ref 3.87–5.11)
RDW: 14.4 % (ref 11.5–15.5)
WBC: 4.6 10*3/uL (ref 4.0–10.5)

## 2014-07-06 LAB — BASIC METABOLIC PANEL
Anion gap: 14 (ref 5–15)
BUN: 10 mg/dL (ref 6–23)
CO2: 24 meq/L (ref 19–32)
Calcium: 10.1 mg/dL (ref 8.4–10.5)
Chloride: 101 mEq/L (ref 96–112)
Creatinine, Ser: 0.94 mg/dL (ref 0.50–1.10)
GFR calc Af Amer: 78 mL/min — ABNORMAL LOW (ref >90)
GFR calc non Af Amer: 67 mL/min — ABNORMAL LOW (ref >90)
Glucose, Bld: 117 mg/dL — ABNORMAL HIGH (ref 70–99)
POTASSIUM: 3.7 meq/L (ref 3.7–5.3)
SODIUM: 139 meq/L (ref 137–147)

## 2014-07-06 LAB — URINALYSIS, ROUTINE W REFLEX MICROSCOPIC
Bilirubin Urine: NEGATIVE
GLUCOSE, UA: NEGATIVE mg/dL
HGB URINE DIPSTICK: NEGATIVE
Ketones, ur: NEGATIVE mg/dL
Nitrite: NEGATIVE
PH: 5.5 (ref 5.0–8.0)
Protein, ur: NEGATIVE mg/dL
SPECIFIC GRAVITY, URINE: 1.02 (ref 1.005–1.030)
Urobilinogen, UA: 0.2 mg/dL (ref 0.0–1.0)

## 2014-07-06 LAB — URINE MICROSCOPIC-ADD ON

## 2014-07-06 MED ORDER — ASPIRIN 81 MG PO CHEW
324.0000 mg | CHEWABLE_TABLET | Freq: Once | ORAL | Status: AC
  Administered 2014-07-06: 324 mg via ORAL
  Filled 2014-07-06: qty 4

## 2014-07-06 MED ORDER — HYDROCODONE-ACETAMINOPHEN 7.5-325 MG PO TABS
1.0000 | ORAL_TABLET | Freq: Four times a day (QID) | ORAL | Status: DC | PRN
  Administered 2014-07-07 – 2014-07-08 (×3): 1 via ORAL
  Filled 2014-07-06 (×3): qty 1

## 2014-07-06 MED ORDER — ENOXAPARIN SODIUM 40 MG/0.4ML ~~LOC~~ SOLN
40.0000 mg | SUBCUTANEOUS | Status: DC
  Administered 2014-07-06: 40 mg via SUBCUTANEOUS
  Filled 2014-07-06 (×2): qty 0.4

## 2014-07-06 MED ORDER — HYDRALAZINE HCL 20 MG/ML IJ SOLN: 5.0000 mg | Freq: Four times a day (QID) | INTRAMUSCULAR | Status: DC | PRN

## 2014-07-06 MED ORDER — STROKE: EARLY STAGES OF RECOVERY BOOK
Freq: Once | Status: AC
  Administered 2014-07-06: 17:00:00
  Filled 2014-07-06: qty 1

## 2014-07-06 MED ORDER — ASPIRIN 325 MG PO TABS
325.0000 mg | ORAL_TABLET | Freq: Every day | ORAL | Status: DC
  Administered 2014-07-07 – 2014-07-08 (×2): 325 mg via ORAL
  Filled 2014-07-06 (×2): qty 1

## 2014-07-06 MED ORDER — FLUTICASONE PROPIONATE 50 MCG/ACT NA SUSP: 2.0000 | Freq: Every day | NASAL | Status: DC | PRN

## 2014-07-06 MED ORDER — ONDANSETRON HCL 4 MG/2ML IJ SOLN: 4.0000 mg | Freq: Three times a day (TID) | INTRAMUSCULAR | Status: AC | PRN

## 2014-07-06 MED ORDER — LORATADINE 10 MG PO TABS
10.0000 mg | ORAL_TABLET | Freq: Every day | ORAL | Status: DC
  Administered 2014-07-06 – 2014-07-08 (×3): 10 mg via ORAL
  Filled 2014-07-06 (×2): qty 1

## 2014-07-06 NOTE — ED Provider Notes (Signed)
Medical screening examination/treatment/procedure(s) were conducted as a shared visit with non-physician practitioner(s) and myself.  I personally evaluated the patient during the encounter.   EKG Interpretation None      58 year old female presenting with 2 days of left face, left hand, left lower extremity numbness. She has some subjective weakness in her left hand.  On exam, well appearing, nontoxic, not distressed, normal respiratory effort, normal perfusion, sensation grossly intact, strength grossly intact.  CT shows subtle lucencies, age indeterminate. Plan MRI.  MRI showed stroke. Admit.  Clinical Impression: 1. CVA (cerebral vascular accident)   2. Tobacco use   3. Essential hypertension   4. HLD (hyperlipidemia)       Houston Siren III, MD 07/06/14 [phone]

## 2014-07-06 NOTE — Consult Note (Signed)
Referring Physician: Karleen Hampshire    Chief Complaint: Left sided numbness  HPI:                                                                                                                                         Brittany Crawford is an 58 y.o. female with known chronic back pain and right foot weakness which causes some gait instability. Patient also has not been taking her BP medication as directed over the past two months.  Patient states she awoke on Monday morning (4 days ago) and noted she had a funny feeling in her left foot, leg, arm and face.  She felt as it was decreased. She denies any other symptoms such as weakness, blurred or double vision, dysarthria or dysphagia. Currently she continues to have subjective decreased sensation on her left face, tongue, throat, arm and leg.   Date last known well: Date: 07/03/2014 Time last known well: Unable to determine tPA Given: No: out of window  Past Medical History  Diagnosis Date  . H/O sarcoidosis   . Hypertension   . Seasonal allergies   . Chronic back pain     Past Surgical History  Procedure Laterality Date  . Foot surgery      left-pins placed  . Lymph node biopsy    . Dilatation & currettage/hysteroscopy with resectocope N/A 12/10/2012    Procedure: DILATATION & CURETTAGE/HYSTEROSCOPY WITH RESECTOCOPE;  Surgeon: Marvene Staff, MD;  Location: [facility];  Service: Gynecology;  Laterality: N/A;  . Polypectomy N/A 12/10/2012    Procedure: POLYPECTOMY;  Surgeon: Marvene Staff, MD;  Location: [facility];  Service: Gynecology;  Laterality: N/A;    Family History  Problem Relation Age of Onset  . Colon cancer Maternal Uncle   . Stroke Mother    Social History:  reports that she has been smoking Cigarettes.  She has been smoking about 2.00 packs per day for the past 0 years. She has never used smokeless tobacco. She reports that she drinks about 1.2 ounces of alcohol per week. She reports that she does not use illicit  drugs.  Allergies: No Known Allergies  Medications:                                                                                                                           Current Facility-Administered Medications  Medication Dose  Route Frequency Provider Last Rate Last Dose  . ondansetron (ZOFRAN) injection 4 mg  4 mg Intravenous Q8H PRN Monico Blitz, PA-C       Current Outpatient Prescriptions  Medication Sig Dispense Refill  . amLODipine (NORVASC) 5 MG tablet Take 1 tablet (5 mg total) by mouth daily.  30 tablet  2  . cetirizine (ZYRTEC) 10 MG tablet Take 10 mg by mouth daily as needed for allergies.      Marland Kitchen doxycycline (VIBRAMYCIN) 100 MG capsule Take 1 capsule (100 mg total) by mouth 2 (two) times daily.  20 capsule  0  . HYDROcodone-acetaminophen (NORCO) 7.5-325 MG per tablet Take 1 tablet by mouth every 6 (six) hours as needed for moderate pain.      . Hypromellose (ARTIFICIAL TEARS OP) Place 1 drop into the right eye daily as needed (for dry eyes).      . meloxicam (MOBIC) 15 MG tablet Take 15 mg by mouth daily as needed for pain.       . Multiple Vitamins-Minerals (WOMENS ONE DAILY PO) Take 1 tablet by mouth daily.      . prednisoLONE acetate (PRED FORTE) 1 % ophthalmic suspension Place 1 drop into the left eye as needed (flare ups).       Marland Kitchen telmisartan-hydrochlorothiazide (MICARDIS HCT) 40-12.5 MG per tablet Take 1 tablet by mouth daily.      Marland Kitchen triamcinolone cream (KENALOG) 0.1 % Apply 1 application topically at bedtime as needed (for rash).      . fluticasone (FLONASE) 50 MCG/ACT nasal spray Place 2 sprays into both nostrils daily as needed for allergies or rhinitis.         ROS:                                                                                                                                       History obtained from the patient  General ROS: negative for - chills, fatigue, fever, night sweats, weight gain or weight loss Psychological ROS: negative for -  behavioral disorder, hallucinations, memory difficulties, mood swings or suicidal ideation Ophthalmic ROS: negative for - blurry vision, double vision, eye pain or loss of vision ENT ROS: negative for - epistaxis, nasal discharge, oral lesions, sore throat, tinnitus or vertigo Allergy and Immunology ROS: negative for - hives or itchy/watery eyes Hematological and Lymphatic ROS: negative for - bleeding problems, bruising or swollen lymph nodes Endocrine ROS: negative for - galactorrhea, hair pattern changes, polydipsia/polyuria or temperature intolerance Respiratory ROS: negative for - cough, hemoptysis, shortness of breath or wheezing Cardiovascular ROS: negative for - chest pain, dyspnea on exertion, edema or irregular heartbeat Gastrointestinal ROS: negative for - abdominal pain, diarrhea, hematemesis, nausea/vomiting or stool incontinence Genito-Urinary ROS: negative for - dysuria, hematuria, incontinence or urinary frequency/urgency Musculoskeletal ROS: negative for - joint swelling or muscular weakness Neurological ROS: as noted in HPI Dermatological ROS: negative for rash and skin  lesion changes  Neurologic Examination:                                                                                                      Blood pressure 156/112, pulse 65, temperature 98.2 F (36.8 C), temperature source Oral, resp. rate 18, height 5' 7.5" (1.715 m), weight 79.379 kg (175 lb), last menstrual period 11/13/2012, SpO2 98.00%.   General: NAD Mental Status: Alert, oriented, thought content appropriate.  Speech fluent without evidence of aphasia.  Able to follow 3 step commands without difficulty. Cranial Nerves: II: Discs flat bilaterally; Visual fields grossly normal, pupils equal, round, reactive to light and accommodation III,IV, VI: ptosis not present, extra-ocular motions intact bilaterally--at rest has left eye esotropia (baseline) V,VII: smile symmetric, facial light touch sensation  normal bilaterally to formal exam VIII: hearing normal bilaterally IX,X: gag reflex present XI: bilateral shoulder shrug XII: midline tongue extension without atrophy or fasciculations  Motor: Right : Upper extremity   5/5    Left:     Upper extremity   5/5  Lower extremity   5/5     Lower extremity   5/5 Tone and bulk:normal tone throughout; no atrophy noted Sensory: Pinprick and light touch intact throughout, bilaterally--to formal exam Deep Tendon Reflexes:  Right: Upper Extremity   Left: Upper extremity   biceps (C-5 to C-6) 2/4   biceps (C-5 to C-6) 2/4 tricep (C7) 2/4    triceps (C7) 2/4 Brachioradialis (C6) 2/4  Brachioradialis (C6) 2/4  Lower Extremity Lower Extremity  quadriceps (L-2 to L-4) 2/4   quadriceps (L-2 to L-4) 2/4 Achilles (S1) 1/4   Achilles (S1) 1/4  Plantars: Right: downgoing   Left: downgoing Cerebellar: normal finger-to-nose,  normal heel-to-shin test Gait: not tested CV: pulses palpable throughout    Lab Results: Basic Metabolic Panel:  Recent Labs Lab 07/06/14 0849  NA 139  K 3.7  CL 101  CO2 24  GLUCOSE 117*  BUN 10  CREATININE 0.94  CALCIUM 10.1    Liver Function Tests: No results found for this basename: AST, ALT, ALKPHOS, BILITOT, PROT, ALBUMIN,  in the last 168 hours No results found for this basename: LIPASE, AMYLASE,  in the last 168 hours No results found for this basename: AMMONIA,  in the last 168 hours  CBC:  Recent Labs Lab 07/06/14 0849  WBC 4.6  NEUTROABS 1.9  HGB 13.2  HCT 38.6  MCV 86.5  PLT 272    Cardiac Enzymes: No results found for this basename: CKTOTAL, CKMB, CKMBINDEX, TROPONINI,  in the last 168 hours  Lipid Panel: No results found for this basename: CHOL, TRIG, HDL, CHOLHDL, VLDL, LDLCALC,  in the last 168 hours  CBG: No results found for this basename: GLUCAP,  in the last 168 hours  Microbiology: Results for orders placed during the hospital encounter of 04/12/14  URINE CULTURE     Status:  None   Collection Time    04/12/14  4:48 PM      Result Value Ref Range Status   Specimen Description URINE, CLEAN CATCH  Final   Special Requests none Normal   Final   Culture  Setup Time     Final   Value: 04/12/2014 18:43     Performed at [facility]     Final   Value: 30,000 COLONIES/ML     Performed at [facility]   Culture     Final   Value: ESCHERICHIA COLI     Performed at [facility]   Report Status 04/14/2014 FINAL   Final   Organism ID, Bacteria ESCHERICHIA COLI   Final    Coagulation Studies: No results found for this basename: LABPROT, INR,  in the last 72 hours  Imaging: Ct Head Wo Contrast  07/06/2014   CLINICAL DATA:  Left-sided numbness.  EXAM: CT HEAD WITHOUT CONTRAST  TECHNIQUE: Contiguous axial images were obtained from the base of the skull through the vertex without intravenous contrast.  COMPARISON:  05/22/2004.  FINDINGS: No mass. No hydrocephalus. No hemorrhage. Subtle lucency noted of region posteriorly of the right internal capsule. Small recurrent for cannot be excluded, age undetermined. Seven seen in the left frontal periventricular white matter. These changes may be related to chronic white matter ischemia. No acute bony abnormality. Mucosal thickening noted of the sphenoid sinus.  IMPRESSION: 1. Subtle lucencies noted posteriorly in the right internal capsule and anteriorly in the left frontal periventricular white matter. These may represent subtle areas of ischemia, age undetermined.  2.  Mucosal thickening sphenoid sinus.   Electronically Signed   By: Marcello Moores  Register   On: 07/06/2014 09:18   Mr Jodene Nam Head Wo Contrast  07/06/2014   CLINICAL DATA:  58 year old female who awoke with left facial and extremity numbness. Tingling. Initial encounter.  EXAM: MRI HEAD WITHOUT CONTRAST  MRA HEAD WITHOUT CONTRAST  TECHNIQUE: Multiplanar, multiecho pulse sequences of the brain and surrounding structures were obtained without  intravenous contrast. Angiographic images of the head were obtained using MRA technique without contrast.  COMPARISON:  Head CT without contrast 0912 hr the same day, 05/22/2004.  FINDINGS: MRI HEAD FINDINGS  9 mm oval focus of restricted diffusion in the lateral right thalamus bordering the posterior limb of the right internal capsule. Mild associated T2 and FLAIR hyperintensity. No mass effect or associated hemorrhage.  No other areas of restricted diffusion. Major intracranial vascular flow voids are preserved.  Cerebral volume is normal. No midline shift, mass effect, evidence of mass lesion, ventriculomegaly, extra-axial collection or acute intracranial hemorrhage. Cervicomedullary junction and pituitary are within normal limits. Negative visualized cervical spine. Scattered cerebral white matter T2 and FLAIR hyperintense foci, Mild to moderate for age and slightly greater in the left hemisphere, specially long anterior limb of the left external capsule. No cortical encephalomalacia identified. Deep gray matter nuclei other than right thalamus are within normal limits. Brainstem and cerebellum are within normal limits. No chronic hemorrhage identified in the brain.  Visible internal auditory structures appear normal. Mastoids are clear. Left paranasal sinus mucosal thickening, other paranasal sinuses are clear. Visualized scalp soft tissues are within normal limits. Visualized bone marrow signal is within normal limits. 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 Otherwise negative orbits soft tissues.  MRA HEAD FINDINGS  Somewhat diminutive vertebrobasilar system owing 2 bilateral fetal type PCA origins. Codominant distal vertebral arteries are patent. Patent vertebrobasilar junction. No basilar or arteries stenosis. Fetal type PCA origins, both posterior communicating arteries are tortuous slightly greater on the left. SCA origins are patent. Bilateral PCA branches are within  normal limits. No PCA  irregularity.  Antegrade flow in both ICA siphons. No siphon stenosis. Ophthalmic artery origins are not well visualized. The left cavernous ICA is asymmetrically tortuous. Patent carotid termini. Normal MCA and ACA origins. Anterior communicating artery and visualized bilateral ACA branches are within normal limits. Visualized bilateral MCA branches are within normal limits.  IMPRESSION: 1. Small acute lacunar infarct in the lateral right thalamus near the posterior limb of the right internal capsule. 2. Negative intracranial MRA; fetal type PCA origins, left ICA and posterior communicating artery tortuosity.   Electronically Signed   By: Lars Pinks M.D.   On: 07/06/2014 13:34   Mr Brain Wo Contrast  07/06/2014   CLINICAL DATA:  58 year old female who awoke with left facial and extremity numbness. Tingling. Initial encounter.  EXAM: MRI HEAD WITHOUT CONTRAST  MRA HEAD WITHOUT CONTRAST  TECHNIQUE: Multiplanar, multiecho pulse sequences of the brain and surrounding structures were obtained without intravenous contrast. Angiographic images of the head were obtained using MRA technique without contrast.  COMPARISON:  Head CT without contrast 0912 hr the same day, 05/22/2004.  FINDINGS: MRI HEAD FINDINGS  9 mm oval focus of restricted diffusion in the lateral right thalamus bordering the posterior limb of the right internal capsule. Mild associated T2 and FLAIR hyperintensity. No mass effect or associated hemorrhage.  No other areas of restricted diffusion. Major intracranial vascular flow voids are preserved.  Cerebral volume is normal. No midline shift, mass effect, evidence of mass lesion, ventriculomegaly, extra-axial collection or acute intracranial hemorrhage. Cervicomedullary junction and pituitary are within normal limits. Negative visualized cervical spine. Scattered cerebral white matter T2 and FLAIR hyperintense foci, Mild to moderate for age and slightly greater in the left hemisphere, specially long  anterior limb of the left external capsule. No cortical encephalomalacia identified. Deep gray matter nuclei other than right thalamus are within normal limits. Brainstem and cerebellum are within normal limits. No chronic hemorrhage identified in the brain.  Visible internal auditory structures appear normal. Mastoids are clear. Left paranasal sinus mucosal thickening, other paranasal sinuses are clear. Visualized scalp soft tissues are within normal limits. Visualized bone marrow signal is within normal limits. 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 Otherwise negative orbits soft tissues.  MRA HEAD FINDINGS  Somewhat diminutive vertebrobasilar system owing 2 bilateral fetal type PCA origins. Codominant distal vertebral arteries are patent. Patent vertebrobasilar junction. No basilar or arteries stenosis. Fetal type PCA origins, both posterior communicating arteries are tortuous slightly greater on the left. SCA origins are patent. Bilateral PCA branches are within normal limits. No PCA irregularity.  Antegrade flow in both ICA siphons. No siphon stenosis. Ophthalmic artery origins are not well visualized. The left cavernous ICA is asymmetrically tortuous. Patent carotid termini. Normal MCA and ACA origins. Anterior communicating artery and visualized bilateral ACA branches are within normal limits. Visualized bilateral MCA branches are within normal limits.  IMPRESSION: 1. Small acute lacunar infarct in the lateral right thalamus near the posterior limb of the right internal capsule. 2. Negative intracranial MRA; fetal type PCA origins, left ICA and posterior communicating artery tortuosity.   Electronically Signed   By: Lars Pinks M.D.   On: 07/06/2014 13:34    Etta Quill PA-C [facility] [phone]  07/06/2014, 3:10 PM  Patient seen and examined.  Clinical course and management discussed.  Necessary edits performed.  I agree with the above.  Assessment and plan of care  developed and discussed below.     Assessment: 58 y.o. female  with new onset of left sided decreased sensation which started 4 days prior. Symptoms did not resolve which prompted patient to be seen in ED. Neurological examination is non focal but patient continues to have subjective decreased sensation on the left face, arm and leg. Initial CT showed subtle lucencies of the posterior right internal capsule.  MRI of the brain reviewed and shows an acute infarct in the right thalamus.  Patient is outside tPA and intervention window.   Stroke Risk Factors - hypertension  Recommend: 1. HgbA1c, fasting lipid panel 2. PT consult, OT consult, Speech consult 3. Echocardiogram 4. Carotid dopplers 5. Prophylactic therapy-Antiplatelet med: Aspirin - dose 81 mg daily 6. Risk factor modification 7. Telemetry monitoring 8. Frequent neuro checks    Alexis Goodell, MD [facility] [phone]  07/06/2014  4:18 PM

## 2014-07-06 NOTE — ED Provider Notes (Signed)
CSN: [id number]     Arrival date & time 07/06/14  0801 History   First MD Initiated Contact with Patient 07/06/14 0825     Chief Complaint  Patient presents with  . Numbness    L sided     (Consider location/radiation/quality/duration/timing/severity/associated sxs/prior Treatment) HPI  Brittany Crawford is a 58 y.o. female he was advised to come to the ED from pain management (chronic back pain) with past medical history significant for sarcoidosis, hypertension, active daily smoker complaining of left-sided numbness onset 4 days ago. Patient describes the numbness as a sensation of novocaine: States that she feels this on the entire left side of the face upper and lower, left arm and left leg from the foot to the knee. She describes a sensation in the leg as "pinging." Cannot explain what this means. 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 On review of systems patient states she does feel more unsteady on her feet than normal. It also feels that she has a foreign body in the left side of her throat and states that swallowing feels strange.   Past Medical History  Diagnosis Date  . H/O sarcoidosis   . Hypertension   . Seasonal allergies   . Chronic back pain    Past Surgical History  Procedure Laterality Date  . Foot surgery      left-pins placed  . Lymph node biopsy    . Dilatation & currettage/hysteroscopy with resectocope N/A 12/10/2012    Procedure: DILATATION & CURETTAGE/HYSTEROSCOPY WITH RESECTOCOPE;  Surgeon: Marvene Staff, MD;  Location: [facility];  Service: Gynecology;  Laterality: N/A;  . Polypectomy N/A 12/10/2012    Procedure: POLYPECTOMY;  Surgeon: Marvene Staff, MD;  Location: [facility];  Service: Gynecology;  Laterality: N/A;   Family History  Problem Relation Age of Onset  . Colon cancer Maternal Uncle   . Stroke Mother    History   Substance Use Topics  . Smoking status: Current Every Day Smoker -- 2.00 packs/day for 0 years    Types: Cigarettes  . Smokeless tobacco: Never Used     Comment: 1-2 cigarettes daily for years  . Alcohol Use: 1.2 oz/week    2 Cans of beer per week   OB History   Grav Para Term Preterm Abortions TAB SAB Ect Mult Living                 Review of Systems    Allergies  Review of patient's allergies indicates no known allergies.  Home Medications   Prior to Admission medications   Medication Sig Start Date End Date Taking? Authorizing Provider  amLODipine (NORVASC) 5 MG tablet Take 1 tablet (5 mg total) by mouth daily. 12/12/13  Yes Harden Mo, MD  cetirizine (ZYRTEC) 10 MG tablet Take 10 mg by mouth daily as needed for allergies.   Yes Historical Provider, MD  doxycycline (VIBRAMYCIN) 100 MG capsule Take 1 capsule (100 mg total) by mouth 2 (two) times daily. 04/12/14  Yes Audelia Hives Presson, PA  HYDROcodone-acetaminophen (NORCO) 7.5-325 MG per tablet Take 1 tablet by mouth every 6 (six) hours as needed for moderate pain.   Yes Historical Provider, MD  Hypromellose (ARTIFICIAL TEARS OP) Place 1 drop into the right eye daily as needed (for dry eyes).   Yes Historical Provider, MD  meloxicam (MOBIC) 15 MG tablet Take 15  mg by mouth daily as needed for pain.    Yes Historical Provider, MD  Multiple Vitamins-Minerals (WOMENS ONE DAILY PO) Take 1 tablet by mouth daily.   Yes Historical Provider, MD  prednisoLONE acetate (PRED FORTE) 1 % ophthalmic suspension Place 1 drop into the left eye as needed (flare ups).    Yes Historical Provider, MD  telmisartan-hydrochlorothiazide (MICARDIS HCT) 40-12.5 MG per tablet Take 1 tablet by mouth daily.   Yes Historical Provider, MD  triamcinolone cream (KENALOG) 0.1 % Apply 1 application topically at bedtime as needed (for rash).   Yes Historical Provider, MD  fluticasone (FLONASE) 50 MCG/ACT nasal spray Place 2 sprays into both nostrils daily as  needed for allergies or rhinitis.    Historical Provider, MD   BP 156/112  Pulse 65  Temp(Src) 98.2 F (36.8 C) (Oral)  Resp 18  Ht 5' 7.5" (1.715 m)  Wt 175 lb (79.379 kg)  BMI 26.99 kg/m2  SpO2 98%  LMP 11/13/2012 Physical Exam  Nursing note and vitals reviewed. Constitutional: She is oriented to person, place, and time. She appears well-developed and well-nourished. No distress.  HENT:  Head: Normocephalic.  Eyes: Conjunctivae and EOM are normal.  Cardiovascular: Normal rate.   Pulmonary/Chest: Effort normal and breath sounds normal. No stridor.  Musculoskeletal: Normal range of motion. She exhibits no edema and no tenderness.  Neurological: She is alert and oriented to person, place, and time.  II-Visual fields grossly intact. Reports decreased sensation to left 4 head, left maxillary and mandibular areas left upper and lower arm and left lower leg distal to the shin. III/IV/VI-Extraocular movements intact.  Pupils reactive bilaterally. V/VII-Smile symmetric, equal eyebrow raise,  facial sensation intact VIII- Hearing grossly intact IX/X-Normal gag XI-bilateral shoulder shrug XII-midline tongue extension Motor: 5/5 bilaterally with normal tone and bulk Cerebellar: Normal finger-to-nose  and normal heel-to-shin test.   Romberg negative Ambulates with a coordinated gait   Psychiatric: She has a normal mood and affect.    ED Course  Procedures (including critical care time) Labs Review Labs Reviewed  CBC WITH DIFFERENTIAL - Abnormal; Notable for the following:    Neutrophils Relative % 41 (*)    All other components within normal limits  BASIC METABOLIC PANEL - Abnormal; Notable for the following:    Glucose, Bld 117 (*)    GFR calc non Af Amer 67 (*)    GFR calc Af Amer 78 (*)    All other components within normal limits  URINALYSIS, ROUTINE W REFLEX MICROSCOPIC - Abnormal; Notable for the following:    Leukocytes, UA SMALL (*)    All other components within  normal limits  URINE MICROSCOPIC-ADD ON  URINE RAPID DRUG SCREEN (HOSP PERFORMED)    Imaging Review Ct Head Wo Contrast  07/06/2014   CLINICAL DATA:  Left-sided numbness.  EXAM: CT HEAD WITHOUT CONTRAST  TECHNIQUE: Contiguous axial images were obtained from the base of the skull through the vertex without intravenous contrast.  COMPARISON:  05/22/2004.  FINDINGS: No mass. No hydrocephalus. No hemorrhage. Subtle lucency noted of region posteriorly of the right internal capsule. Small recurrent for cannot be excluded, age undetermined. Seven seen in the left frontal periventricular white matter. These changes may be related to chronic white matter ischemia. No acute bony abnormality. Mucosal thickening noted of the sphenoid sinus.  IMPRESSION: 1. Subtle lucencies noted posteriorly in the right internal capsule and anteriorly in the left frontal periventricular white matter. These may represent subtle areas of ischemia, age undetermined.  2.  Mucosal thickening sphenoid sinus.   Electronically Signed   By: Marcello Moores  Register   On: 07/06/2014 09:18   Mr Jodene Nam Head Wo Contrast  07/06/2014   CLINICAL DATA:  58 year old female who awoke with left facial and extremity numbness. Tingling. Initial encounter.  EXAM: MRI HEAD WITHOUT CONTRAST  MRA HEAD WITHOUT CONTRAST  TECHNIQUE: Multiplanar, multiecho pulse sequences of the brain and surrounding structures were obtained without intravenous contrast. Angiographic images of the head were obtained using MRA technique without contrast.  COMPARISON:  Head CT without contrast 0912 hr the same day, 05/22/2004.  FINDINGS: MRI HEAD FINDINGS  9 mm oval focus of restricted diffusion in the lateral right thalamus bordering the posterior limb of the right internal capsule. Mild associated T2 and FLAIR hyperintensity. No mass effect or associated hemorrhage.  No other areas of restricted diffusion. Major intracranial vascular flow voids are preserved.  Cerebral volume is normal. No  midline shift, mass effect, evidence of mass lesion, ventriculomegaly, extra-axial collection or acute intracranial hemorrhage. Cervicomedullary junction and pituitary are within normal limits. Negative visualized cervical spine. Scattered cerebral white matter T2 and FLAIR hyperintense foci, Mild to moderate for age and slightly greater in the left hemisphere, specially long anterior limb of the left external capsule. No cortical encephalomalacia identified. Deep gray matter nuclei other than right thalamus are within normal limits. Brainstem and cerebellum are within normal limits. No chronic hemorrhage identified in the brain.  Visible internal auditory structures appear normal. Mastoids are clear. Left paranasal sinus mucosal thickening, other paranasal sinuses are clear. Visualized scalp soft tissues are within normal limits. Visualized bone marrow signal is within normal limits. 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 Otherwise negative orbits soft tissues.  MRA HEAD FINDINGS  Somewhat diminutive vertebrobasilar system owing 2 bilateral fetal type PCA origins. Codominant distal vertebral arteries are patent. Patent vertebrobasilar junction. No basilar or arteries stenosis. Fetal type PCA origins, both posterior communicating arteries are tortuous slightly greater on the left. SCA origins are patent. Bilateral PCA branches are within normal limits. No PCA irregularity.  Antegrade flow in both ICA siphons. No siphon stenosis. Ophthalmic artery origins are not well visualized. The left cavernous ICA is asymmetrically tortuous. Patent carotid termini. Normal MCA and ACA origins. Anterior communicating artery and visualized bilateral ACA branches are within normal limits. Visualized bilateral MCA branches are within normal limits.  IMPRESSION: 1. Small acute lacunar infarct in the lateral right thalamus near the posterior limb of the right internal capsule. 2. Negative intracranial MRA; fetal type  PCA origins, left ICA and posterior communicating artery tortuosity.   Electronically Signed   By: Lars Pinks M.D.   On: 07/06/2014 13:34   Mr Brain Wo Contrast  07/06/2014   CLINICAL DATA:  58 year old female who awoke with left facial and extremity numbness. Tingling. Initial encounter.  EXAM: MRI HEAD WITHOUT CONTRAST  MRA HEAD WITHOUT CONTRAST  TECHNIQUE: Multiplanar, multiecho pulse sequences of the brain and surrounding structures were obtained without intravenous contrast. Angiographic images of the head were obtained using MRA technique without contrast.  COMPARISON:  Head CT without contrast 0912 hr the same day, 05/22/2004.  FINDINGS: MRI HEAD FINDINGS  9 mm oval focus of restricted diffusion in the lateral right thalamus bordering the posterior limb of the right internal capsule. Mild associated T2 and FLAIR hyperintensity. No mass effect or associated hemorrhage.  No other areas of restricted diffusion. Major intracranial vascular flow voids are preserved.  Cerebral volume is normal. No midline shift,  mass effect, evidence of mass lesion, ventriculomegaly, extra-axial collection or acute intracranial hemorrhage. Cervicomedullary junction and pituitary are within normal limits. Negative visualized cervical spine. Scattered cerebral white matter T2 and FLAIR hyperintense foci, Mild to moderate for age and slightly greater in the left hemisphere, specially long anterior limb of the left external capsule. No cortical encephalomalacia identified. Deep gray matter nuclei other than right thalamus are within normal limits. Brainstem and cerebellum are within normal limits. No chronic hemorrhage identified in the brain.  Visible internal auditory structures appear normal. Mastoids are clear. Left paranasal sinus mucosal thickening, other paranasal sinuses are clear. Visualized scalp soft tissues are within normal limits. Visualized bone marrow signal is within normal limits. Left globe buphthalmos (axial  myopia and/or coloboma). Otherwise negative orbits soft tissues.  MRA HEAD FINDINGS  Somewhat diminutive vertebrobasilar system owing 2 bilateral fetal type PCA origins. Codominant distal vertebral arteries are patent. Patent vertebrobasilar junction. No basilar or arteries stenosis. Fetal type PCA origins, both posterior communicating arteries are tortuous slightly greater on the left. SCA origins are patent. Bilateral PCA branches are within normal limits. No PCA irregularity.  Antegrade flow in both ICA siphons. No siphon stenosis. Ophthalmic artery origins are not well visualized. The left cavernous ICA is asymmetrically tortuous. Patent carotid termini. Normal MCA and ACA origins. Anterior communicating artery and visualized bilateral ACA branches are within normal limits. Visualized bilateral MCA branches are within normal limits.  IMPRESSION: 1. Small acute lacunar infarct in the lateral right thalamus near the posterior limb of the right internal capsule. 2. Negative intracranial MRA; fetal type PCA origins, left ICA and posterior communicating artery tortuosity.   Electronically Signed   By: Lars Pinks M.D.   On: 07/06/2014 13:34     EKG Interpretation None      MDM   Final diagnoses:  CVA (cerebral vascular accident)  Tobacco use  Essential hypertension  HLD (hyperlipidemia)    Filed Vitals:   07/06/14 1330 07/06/14 1400 07/06/14 1430 07/06/14 1435  BP: 147/99 164/95 156/112   Pulse: 63 69 65   Temp:    98.2 F (36.8 C)  TempSrc:      Resp:      Height:      Weight:      SpO2: 99% 98% 98%     Medications  ondansetron (ZOFRAN) injection 4 mg (not administered)  aspirin chewable tablet 324 mg (324 mg Oral Given 07/06/14 1442)    Brittany Crawford is a 58 y.o. female presenting with decreased sensation to left face arm and leg onset 4 days ago. Neuro exam is otherwise nonfocal. CT with subtle lucencies in the right internal capsule. Case discussed with neurologist Dr. Doy Mince  who recommends MRI. MRI confirmed CVA with a small acute lacunar infarcts in the right internal capsule. Discussed findings with Dr. Doy Mince who will consult. Patient will be admitted to try a hospitalist Dr.Akula.    Monico Blitz, PA-C 07/06/14 [phone]

## 2014-07-06 NOTE — ED Notes (Signed)
Pt here for L sided numbness since Monday morning.  She noticed it first when she stepped out of bed - felt as if the whole L side of the body fell asleep (chronic back pain has always increased when pt stands).  Pt states she feels as if she has "rings on and they're too tight". Pt is seen at pain clinic for chronic back pain.  Speech clear.  Equal grips.

## 2014-07-06 NOTE — H&P (Signed)
[facility] History and Physical  Rainelle Sulewski RKY:[id number] DOB: 05/04/1956 DOA: 07/06/2014  Referring physician: EDP PCP: Salena Saner., MD   Chief Complaint: left facial numbness since Monday.   HPI: Brittany Crawford is a 58 y.o. female with h/o hypertension, comes in for left facial numbness, left leg weakness and tingling of the left arm since Monday. She denies any other complaints. Since her symptoms were persistent and she came to ed and was found to have a small acute lacunar infarct in the right thalamus and right IC evident on the MRI brain. She was referred to medical service for admission. Neurology was consulted by EDP.    Review of Systems:  Constitutional:  No weight loss, night sweats, Fevers, chills, fatigue. Left facial numbness.  HEENT:  No headaches, Difficulty swallowing,Tooth/dental problems,Sore throat,  No sneezing, itching, ear ache, nasal congestion, post nasal drip,  Cardio-vascular:  No chest pain, Orthopnea, PND, swelling in lower extremities, anasarca, dizziness, palpitations  GI:  No heartburn, indigestion, abdominal pain, nausea, vomiting, diarrhea, change in bowel habits, loss of appetite  Resp:  No shortness of breath with exertion or at rest. No excess mucus, no productive cough, No non-productive cough, No coughing up of blood.No change in color of mucus.No wheezing.No chest wall deformity  Skin:  no rash or lesions.  GU:  no dysuria, change in color of urine, no urgency or frequency. No flank pain.  Musculoskeletal:  LEFT leg weakness and left arm tingling.  Psych:  No change in mood or affect. No depression or anxiety. No memory loss.   Past Medical History  Diagnosis Date  . H/O sarcoidosis   . Hypertension   . Seasonal allergies   . Chronic back pain    Past Surgical History  Procedure Laterality Date  . Foot surgery      left-pins placed  . Lymph node biopsy    . Dilatation & currettage/hysteroscopy with  resectocope N/A 12/10/2012    Procedure: DILATATION & CURETTAGE/HYSTEROSCOPY WITH RESECTOCOPE;  Surgeon: Marvene Staff, MD;  Location: [facility];  Service: Gynecology;  Laterality: N/A;  . Polypectomy N/A 12/10/2012    Procedure: POLYPECTOMY;  Surgeon: Marvene Staff, MD;  Location: [facility];  Service: Gynecology;  Laterality: N/A;   Social History:  reports that she has been smoking Cigarettes.  She has been smoking about 2.00 packs per day for the past 0 years. She has never used smokeless tobacco. She reports that she drinks about 1.2 ounces of alcohol per week. She reports that she does not use illicit drugs.  No Known Allergies  Family History  Problem Relation Age of Onset  . Colon cancer Maternal Uncle   . Stroke Mother      Prior to Admission medications   Medication Sig Start Date End Date Taking? Authorizing Provider  amLODipine (NORVASC) 5 MG tablet Take 1 tablet (5 mg total) by mouth daily. 12/12/13  Yes Harden Mo, MD  cetirizine (ZYRTEC) 10 MG tablet Take 10 mg by mouth daily as needed for allergies.   Yes Historical Provider, MD  doxycycline (VIBRAMYCIN) 100 MG capsule Take 1 capsule (100 mg total) by mouth 2 (two) times daily. 04/12/14  Yes Audelia Hives Presson, PA  HYDROcodone-acetaminophen (NORCO) 7.5-325 MG per tablet Take 1 tablet by mouth every 6 (six) hours as needed for moderate pain.   Yes Historical Provider, MD  Hypromellose (ARTIFICIAL TEARS OP) Place 1 drop into the right eye daily as needed (for dry eyes).   Yes  Historical Provider, MD  meloxicam (MOBIC) 15 MG tablet Take 15 mg by mouth daily as needed for pain.    Yes Historical Provider, MD  Multiple Vitamins-Minerals (WOMENS ONE DAILY PO) Take 1 tablet by mouth daily.   Yes Historical Provider, MD  prednisoLONE acetate (PRED FORTE) 1 % ophthalmic suspension Place 1 drop into the left eye as needed (flare ups).    Yes Historical Provider, MD  telmisartan-hydrochlorothiazide (MICARDIS HCT) 40-12.5 MG per  tablet Take 1 tablet by mouth daily.   Yes Historical Provider, MD  triamcinolone cream (KENALOG) 0.1 % Apply 1 application topically at bedtime as needed (for rash).   Yes Historical Provider, MD  fluticasone (FLONASE) 50 MCG/ACT nasal spray Place 2 sprays into both nostrils daily as needed for allergies or rhinitis.    Historical Provider, MD   Physical Exam: Filed Vitals:   07/06/14 1400 07/06/14 1430 07/06/14 1435 07/06/14 1630  BP: 164/95 156/112  156/84  Pulse: 69 65  62  Temp:   98.2 F (36.8 C) 99 F (37.2 C)  TempSrc:    Oral  Resp:    20  Height:      Weight:      SpO2: 98% 98%  100%    Wt Readings from Last 3 Encounters:  07/06/14 79.379 kg (175 lb)  07/05/14 79.379 kg (175 lb)  03/07/14 84.142 kg (185 lb 8 oz)    General:  Appears calm and comfortable Eyes: PERRL, normal lids, irises & conjunctiva Neck: no LAD, masses or thyromegaly Cardiovascular: RRR, no m/r/g. No LE edema. Respiratory: CTA bilaterally, no w/r/r. Normal respiratory effort. Abdomen: soft, ntnd Skin: no rash or induration seen on limited exam Musculoskeletal: grossly normal tone BUE/BLE Psychiatric: grossly normal mood and affect, speech fluent and appropriate Neurologic: no facial droop. lazy eye on the left. No motor deficits on exam.           Labs on Admission:  Basic Metabolic Panel:  Recent Labs Lab 07/06/14 0849  NA 139  K 3.7  CL 101  CO2 24  GLUCOSE 117*  BUN 10  CREATININE 0.94  CALCIUM 10.1   Liver Function Tests: No results found for this basename: AST, ALT, ALKPHOS, BILITOT, PROT, ALBUMIN,  in the last 168 hours No results found for this basename: LIPASE, AMYLASE,  in the last 168 hours No results found for this basename: AMMONIA,  in the last 168 hours CBC:  Recent Labs Lab 07/06/14 0849  WBC 4.6  NEUTROABS 1.9  HGB 13.2  HCT 38.6  MCV 86.5  PLT 272   Cardiac Enzymes: No results found for this basename: CKTOTAL, CKMB, CKMBINDEX, TROPONINI,  in the last  168 hours  BNP (last 3 results) No results found for this basename: PROBNP,  in the last 8760 hours CBG: No results found for this basename: GLUCAP,  in the last 168 hours  Radiological Exams on Admission: Ct Head Wo Contrast  07/06/2014   CLINICAL DATA:  Left-sided numbness.  EXAM: CT HEAD WITHOUT CONTRAST  TECHNIQUE: Contiguous axial images were obtained from the base of the skull through the vertex without intravenous contrast.  COMPARISON:  05/22/2004.  FINDINGS: No mass. No hydrocephalus. No hemorrhage. Subtle lucency noted of region posteriorly of the right internal capsule. Small recurrent for cannot be excluded, age undetermined. Seven seen in the left frontal periventricular white matter. These changes may be related to chronic white matter ischemia. No acute bony abnormality. Mucosal thickening noted of the sphenoid sinus.  IMPRESSION: 1.  Subtle lucencies noted posteriorly in the right internal capsule and anteriorly in the left frontal periventricular white matter. These may represent subtle areas of ischemia, age undetermined.  2.  Mucosal thickening sphenoid sinus.   Electronically Signed   By: Marcello Moores  Register   On: 07/06/2014 09:18   Mr Jodene Nam Head Wo Contrast  07/06/2014   CLINICAL DATA:  58 year old female who awoke with left facial and extremity numbness. Tingling. Initial encounter.  EXAM: MRI HEAD WITHOUT CONTRAST  MRA HEAD WITHOUT CONTRAST  TECHNIQUE: Multiplanar, multiecho pulse sequences of the brain and surrounding structures were obtained without intravenous contrast. Angiographic images of the head were obtained using MRA technique without contrast.  COMPARISON:  Head CT without contrast 0912 hr the same day, 05/22/2004.  FINDINGS: MRI HEAD FINDINGS  9 mm oval focus of restricted diffusion in the lateral right thalamus bordering the posterior limb of the right internal capsule. Mild associated T2 and FLAIR hyperintensity. No mass effect or associated hemorrhage.  No other areas of  restricted diffusion. Major intracranial vascular flow voids are preserved.  Cerebral volume is normal. No midline shift, mass effect, evidence of mass lesion, ventriculomegaly, extra-axial collection or acute intracranial hemorrhage. Cervicomedullary junction and pituitary are within normal limits. Negative visualized cervical spine. Scattered cerebral white matter T2 and FLAIR hyperintense foci, Mild to moderate for age and slightly greater in the left hemisphere, specially long anterior limb of the left external capsule. No cortical encephalomalacia identified. Deep gray matter nuclei other than right thalamus are within normal limits. Brainstem and cerebellum are within normal limits. No chronic hemorrhage identified in the brain.  Visible internal auditory structures appear normal. Mastoids are clear. Left paranasal sinus mucosal thickening, other paranasal sinuses are clear. Visualized scalp soft tissues are within normal limits. Visualized bone marrow signal is within normal limits. 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 Otherwise negative orbits soft tissues.  MRA HEAD FINDINGS  Somewhat diminutive vertebrobasilar system owing 2 bilateral fetal type PCA origins. Codominant distal vertebral arteries are patent. Patent vertebrobasilar junction. No basilar or arteries stenosis. Fetal type PCA origins, both posterior communicating arteries are tortuous slightly greater on the left. SCA origins are patent. Bilateral PCA branches are within normal limits. No PCA irregularity.  Antegrade flow in both ICA siphons. No siphon stenosis. Ophthalmic artery origins are not well visualized. The left cavernous ICA is asymmetrically tortuous. Patent carotid termini. Normal MCA and ACA origins. Anterior communicating artery and visualized bilateral ACA branches are within normal limits. Visualized bilateral MCA branches are within normal limits.  IMPRESSION: 1. Small acute lacunar infarct in the lateral right  thalamus near the posterior limb of the right internal capsule. 2. Negative intracranial MRA; fetal type PCA origins, left ICA and posterior communicating artery tortuosity.   Electronically Signed   By: Lars Pinks M.D.   On: 07/06/2014 13:34   Mr Brain Wo Contrast  07/06/2014   CLINICAL DATA:  58 year old female who awoke with left facial and extremity numbness. Tingling. Initial encounter.  EXAM: MRI HEAD WITHOUT CONTRAST  MRA HEAD WITHOUT CONTRAST  TECHNIQUE: Multiplanar, multiecho pulse sequences of the brain and surrounding structures were obtained without intravenous contrast. Angiographic images of the head were obtained using MRA technique without contrast.  COMPARISON:  Head CT without contrast 0912 hr the same day, 05/22/2004.  FINDINGS: MRI HEAD FINDINGS  9 mm oval focus of restricted diffusion in the lateral right thalamus bordering the posterior limb of the right internal capsule. Mild associated T2 and FLAIR hyperintensity.  No mass effect or associated hemorrhage.  No other areas of restricted diffusion. Major intracranial vascular flow voids are preserved.  Cerebral volume is normal. No midline shift, mass effect, evidence of mass lesion, ventriculomegaly, extra-axial collection or acute intracranial hemorrhage. Cervicomedullary junction and pituitary are within normal limits. Negative visualized cervical spine. Scattered cerebral white matter T2 and FLAIR hyperintense foci, Mild to moderate for age and slightly greater in the left hemisphere, specially long anterior limb of the left external capsule. No cortical encephalomalacia identified. Deep gray matter nuclei other than right thalamus are within normal limits. Brainstem and cerebellum are within normal limits. No chronic hemorrhage identified in the brain.  Visible internal auditory structures appear normal. Mastoids are clear. Left paranasal sinus mucosal thickening, other paranasal sinuses are clear. Visualized scalp soft tissues are within  normal limits. Visualized bone marrow signal is within normal limits. 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 Otherwise negative orbits soft tissues.  MRA HEAD FINDINGS  Somewhat diminutive vertebrobasilar system owing 2 bilateral fetal type PCA origins. Codominant distal vertebral arteries are patent. Patent vertebrobasilar junction. No basilar or arteries stenosis. Fetal type PCA origins, both posterior communicating arteries are tortuous slightly greater on the left. SCA origins are patent. Bilateral PCA branches are within normal limits. No PCA irregularity.  Antegrade flow in both ICA siphons. No siphon stenosis. Ophthalmic artery origins are not well visualized. The left cavernous ICA is asymmetrically tortuous. Patent carotid termini. Normal MCA and ACA origins. Anterior communicating artery and visualized bilateral ACA branches are within normal limits. Visualized bilateral MCA branches are within normal limits.  IMPRESSION: 1. Small acute lacunar infarct in the lateral right thalamus near the posterior limb of the right internal capsule. 2. Negative intracranial MRA; fetal type PCA origins, left ICA and posterior communicating artery tortuosity.   Electronically Signed   By: Lars Pinks M.D.   On: 07/06/2014 13:34    EKG: sinus rhythm  Assessment/Plan Active Problems:   CVA (cerebral infarction)   Essential hypertension, benign   Acute CVA; Admitted to telemetry and further stroke workup in progress.  Neurology consulted and recommendations given.  Started on aspirin 325 mg    Hypertension: Permissive hypertension.   DVT prophylaxis.   Code Status: full code.  DVT Prophylaxis:lovenox Family Communication: none at bedside.  Disposition Plan: admit to telemetry.  Time spent: 70min  Alcee Sipos [facility] Pager [phone]

## 2014-07-06 NOTE — ED Notes (Signed)
Attempted to call report

## 2014-07-07 ENCOUNTER — Encounter (HOSPITAL_COMMUNITY): Payer: Self-pay | Admitting: *Deleted

## 2014-07-07 DIAGNOSIS — E785 Hyperlipidemia, unspecified: Secondary | ICD-10-CM

## 2014-07-07 DIAGNOSIS — F1210 Cannabis abuse, uncomplicated: Secondary | ICD-10-CM

## 2014-07-07 DIAGNOSIS — I517 Cardiomegaly: Secondary | ICD-10-CM

## 2014-07-07 DIAGNOSIS — E782 Mixed hyperlipidemia: Secondary | ICD-10-CM | POA: Diagnosis present

## 2014-07-07 LAB — LIPID PANEL
Cholesterol: 192 mg/dL (ref 0–200)
HDL: 62 mg/dL (ref >39)
LDL Cholesterol: 102 mg/dL — ABNORMAL HIGH (ref 0–99)
TRIGLYCERIDES: 142 mg/dL (ref <150)
Total CHOL/HDL Ratio: 3.1 RATIO
VLDL: 28 mg/dL (ref 0–40)

## 2014-07-07 LAB — HEMOGLOBIN A1C
HEMOGLOBIN A1C: 5.9 % — AB (ref <5.7)
Mean Plasma Glucose: 123 mg/dL — ABNORMAL HIGH (ref <117)

## 2014-07-07 MED ORDER — ATORVASTATIN CALCIUM 10 MG PO TABS: 10.0000 mg | ORAL_TABLET | Freq: Every day | ORAL | Status: DC

## 2014-07-07 MED ORDER — PNEUMOCOCCAL VAC POLYVALENT 25 MCG/0.5ML IJ INJ
0.5000 mL | INJECTION | INTRAMUSCULAR | Status: AC
  Administered 2014-07-08: 0.5 mL via INTRAMUSCULAR
  Filled 2014-07-07: qty 0.5

## 2014-07-07 MED ORDER — ATORVASTATIN CALCIUM 10 MG PO TABS
10.0000 mg | ORAL_TABLET | Freq: Every day | ORAL | Status: DC
  Administered 2014-07-07: 10 mg via ORAL
  Filled 2014-07-07: qty 1

## 2014-07-07 MED ORDER — INFLUENZA VAC SPLIT QUAD 0.5 ML IM SUSY
0.5000 mL | PREFILLED_SYRINGE | INTRAMUSCULAR | Status: AC
  Administered 2014-07-08: 0.5 mL via INTRAMUSCULAR
  Filled 2014-07-07: qty 0.5

## 2014-07-07 MED ORDER — ASPIRIN 325 MG PO TABS: 325.0000 mg | ORAL_TABLET | Freq: Every day | ORAL | Status: DC

## 2014-07-07 NOTE — Discharge Instructions (Signed)
Ischemic Stroke °A stroke (cerebrovascular accident) is the sudden death of brain tissue. It is a medical emergency. A stroke can cause permanent loss of brain function. This can cause problems with different parts of your body. A transient ischemic attack (TIA) is different because it does not cause permanent damage. A TIA is a short-lived problem of poor blood flow affecting a part of the brain. A TIA is also a serious problem because having a TIA greatly increases the chances of having a stroke. When symptoms first develop, you cannot know if the problem might be a stroke or a TIA. °CAUSES  °A stroke is caused by a decrease of oxygen supply to an area of your brain. It is usually the result of a small blood clot or collection of cholesterol or fat (plaque) that blocks blood flow in the brain. A stroke can also be caused by blocked or damaged carotid arteries.  °RISK FACTORS °· High blood pressure (hypertension). °· High cholesterol. °· Diabetes mellitus. °· Heart disease. °· The buildup of plaque in the blood vessels (peripheral artery disease or atherosclerosis). °· The buildup of plaque in the blood vessels providing blood and oxygen to the brain (carotid artery stenosis). °· An abnormal heart rhythm (atrial fibrillation). °· Obesity. °· Smoking. °· Taking oral contraceptives (especially in combination with smoking). °· Physical inactivity. °· A diet high in fats, salt (sodium), and calories. °· Alcohol use. °· Use of illegal drugs (especially cocaine and methamphetamine). °· Being African American. °· Being over the age of 55. °· Family history of stroke. °· Previous history of blood clots, stroke, TIA, or heart attack. °· Sickle cell disease. °SYMPTOMS  °These symptoms usually develop suddenly, or may be newly present upon awakening from sleep: °· Sudden weakness or numbness of the face, arm, or leg, especially on one side of the body. °· Sudden trouble walking or difficulty moving arms or legs. °· Sudden  confusion. °· Sudden personality changes. °· Trouble speaking (aphasia) or understanding. °· Difficulty swallowing. °· Sudden trouble seeing in one or both eyes. °· Double vision. °· Dizziness. °· Loss of balance or coordination. °· Sudden severe headache with no known cause. °· Trouble reading or writing. °DIAGNOSIS  °Your health care provider can often determine the presence or absence of a stroke based on your symptoms, history, and physical exam. Computed tomography (CT) of the brain is usually performed to confirm the stroke, determine causes, and determine stroke severity. Other tests may be done to find the cause of the stroke. These tests may include: °· Electrocardiography. °· Continuous heart monitoring. °· Echocardiography. °· Carotid ultrasonography. °· Magnetic resonance imaging (MRI). °· A scan of the brain circulation. °· Blood tests. °PREVENTION  °The risk of a stroke can be decreased by appropriately treating high blood pressure, high cholesterol, diabetes, heart disease, and obesity and by quitting smoking, limiting alcohol, and staying physically active. °TREATMENT  °Time is of the essence. It is important to seek treatment at the first sign of these symptoms because you may receive a medicine to dissolve the clot (thrombolytic) that cannot be given if too much time has passed since your symptoms began. Even if you do not know when your symptoms began, get treatment as soon as possible as there are other treatment options available including oxygen, intravenous (IV) fluids, and medicines to thin the blood (anticoagulants). Treatment of stroke depends on the duration, severity, and cause of your symptoms. Medicines and dietary changes may be used to address diabetes, high blood   pressure, and other risk factors. Physical, speech, and occupational therapists will assess you and work with you to improve any functions impaired by the stroke. Measures will be taken to prevent short-term and long-term  complications, including infection from breathing foreign material into the lungs (aspiration pneumonia), blood clots in the legs, bedsores, and falls. Rarely, surgery may be needed to remove large blood clots or to open up blocked arteries. °HOME CARE INSTRUCTIONS  °· Take medicines only as directed by your health care provider. Follow the directions carefully. Medicines may be used to control risk factors for a stroke. Be sure you understand all your medicine instructions. °· You may be told to take a medicine to thin the blood, such as aspirin or the anticoagulant warfarin. Warfarin needs to be taken exactly as instructed. °¨ Too much and too little warfarin are both dangerous. Too much warfarin increases the risk of bleeding. Too little warfarin continues to allow the risk for blood clots. While taking warfarin, you will need to have regular blood tests to measure your blood clotting time. These blood tests usually include both the PT and INR tests. The PT and INR results allow your health care provider to adjust your dose of warfarin. The dose can change for many reasons. It is critically important that you take warfarin exactly as prescribed, and that you have your PT and INR levels drawn exactly as directed. °¨ Many foods, especially foods high in vitamin K, can interfere with warfarin and affect the PT and INR results. Foods high in vitamin K include spinach, kale, broccoli, cabbage, collard and turnip greens, brussels sprouts, peas, cauliflower, seaweed, and parsley, as well as beef and pork liver, green tea, and soybean oil. You should eat a consistent amount of foods high in vitamin K. Avoid major changes in your diet, or notify your health care provider before changing your diet. Arrange a visit with a dietitian to answer your questions. °¨ Many medicines can interfere with warfarin and affect the PT and INR results. You must tell your health care provider about any and all medicines you take. This  includes all vitamins and supplements. Be especially cautious with aspirin and anti-inflammatory medicines. Do not take or discontinue any prescribed or over-the-counter medicine except on the advice of your health care provider or pharmacist. °¨ Warfarin can have side effects, such as excessive bruising or bleeding. You will need to hold pressure over cuts for longer than usual. Your health care provider or pharmacist will discuss other potential side effects. °¨ Avoid sports or activities that may cause injury or bleeding. °¨ Be mindful when shaving, flossing your teeth, or handling sharp objects. °¨ Alcohol can change the body's ability to handle warfarin. It is best to avoid alcoholic drinks or consume only very small amounts while taking warfarin. Notify your health care provider if you change your alcohol intake. °¨ Notify your dentist or other health care providers before procedures. °· If swallow studies have determined that your swallowing reflex is present, you should eat healthy foods. Including 5 or more servings of fruits and vegetables a day may reduce the risk of stroke. Foods may need to be a certain consistency (soft or pureed), or small bites may need to be taken in order to avoid aspirating or choking. Certain dietary changes may be advised to address high blood pressure, high cholesterol, diabetes, or obesity. °¨ Food choices that are low in sodium, saturated fat, trans fat, and cholesterol are recommended to manage high blood pressure. °¨   Food choies that are high in fiber, and low in saturated fat, trans fat, and cholesterol may control cholesterol levels. °¨ Controlling carbohydrates and sugar intake is recommended to manage diabetes. °¨ Reducing calorie intake and making food choices that are low in sodium, saturated fat, trans fat, and cholesterol are recommended to manage obesity. °· Maintain a healthy weight. °· Stay physically active. It is recommended that you get at least 30 minutes of  activity on all or most days. °· Do not use any tobacco products including cigarettes, chewing tobacco, or electronic cigarettes. °· Limit alcohol use even if you are not taking warfarin. Moderate alcohol use is considered to be: °¨ No more than 2 drinks each day for men. °¨ No more than 1 drink each day for nonpregnant women. °· Home safety. A safe home environment is important to reduce the risk of falls. Your health care provider may arrange for specialists to evaluate your home. Having grab bars in the bedroom and bathroom is often important. Your health care provider may arrange for equipment to be used at home, such as raised toilets and a seat for the shower. °· Physical, occupational, and speech therapy. Ongoing therapy may be needed to maximize your recovery after a stroke. If you have been advised to use a walker or a cane, use it at all times. Be sure to keep your therapy appointments. °· Follow all instructions for follow-up with your health care provider. This is very important. This includes any referrals, physical therapy, rehabilitation, and lab tests. Proper follow-up can prevent another stroke from occurring. °SEEK MEDICAL CARE IF: °· You have personality changes. °· You have difficulty swallowing. °· You are seeing double. °· You have dizziness. °· You have a fever. °· You have skin breakdown. °SEEK IMMEDIATE MEDICAL CARE IF:  °Any of these symptoms may represent a serious problem that is an emergency. Do not wait to see if the symptoms will go away. Get medical help right away. Call your local emergency services (911 in [place] Do not drive yourself to the hospital. °· You have sudden weakness or numbness of the face, arm, or leg, especially on one side of the body. °· You have sudden trouble walking or difficulty moving arms or legs. °· You have sudden confusion. °· You have trouble speaking (aphasia) or understanding. °· You have sudden trouble seeing in one or both eyes. °· You have a loss of  balance or coordination. °· You have a sudden, severe headache with no known cause. °· You have new chest pain or an irregular heartbeat. °· You have a partial or total loss of consciousness. °Document Released: 09/29/2005 Document Revised: 02/13/2014 Document Reviewed: 05/09/2012 °[employer]® Patient Information ©2015 [employer]. This information is not intended to replace advice given to you by your health care provider. Make sure you discuss any questions you have with your health care provider. ° °

## 2014-07-07 NOTE — Progress Notes (Signed)
SLP Cancellation Note  Patient Details Name: Lanitra Battaglini MRN: [id number] DOB: 10/04/1956   Cancelled treatment:       Reason Eval/Treat Not Completed: SLP screened, no needs identified, will sign off   Shamarr Faucett, Katherene Ponto 07/07/2014, 12:58 PM

## 2014-07-07 NOTE — Progress Notes (Signed)
[facility] PROGRESS NOTE  Brittany Crawford OVZ:[id number] DOB: [birth year] DOA: 07/06/2014  PCP: Salena Saner., MD  Brief HPI: 58yo with PMH as below presented with left sided numbness and weakness. She was found to have an acute stroke and was admitted for further work up.  Past medical history:  Past Medical History  Diagnosis Date  . H/O sarcoidosis   . Hypertension   . Seasonal allergies   . Chronic back pain     Consultants: Neurology  Procedures:  2 D ECHO Pending  Carotid Doppler Pending  Antibiotics: None  Subjective: Patient still has some numbness on left side. Denies any other complaints.   Objective: Vital Signs  Filed Vitals:   07/07/14 0200 07/07/14 0400 07/07/14 0620 07/07/14 1055  BP: 157/99 127/82 140/66 134/96  Pulse: 64 61 64 67  Temp:  98.1 F (36.7 C) 98.2 F (36.8 C) 98.3 F (36.8 C)  TempSrc:  Oral Oral Oral  Resp: 18 20 18 20   Height:      Weight:      SpO2: 99% 100% 100% 100%   No intake or output data in the 24 hours ending 07/07/14 1131 Filed Weights   07/06/14 0823  Weight: 79.379 kg (175 lb)    General appearance: alert, cooperative, appears stated age and no distress Resp: clear to auscultation bilaterally Cardio: regular rate and rhythm, S1, S2 normal, no murmur, click, rub or gallop GI: soft, non-tender; bowel sounds normal; no masses,  no organomegaly Neurologic: No obvious weakness on left  Lab Results:  Basic Metabolic Panel:  Recent Labs Lab 07/06/14 0849  NA 139  K 3.7  CL 101  CO2 24  GLUCOSE 117*  BUN 10  CREATININE 0.94  CALCIUM 10.1   CBC:  Recent Labs Lab 07/06/14 0849  WBC 4.6  NEUTROABS 1.9  HGB 13.2  HCT 38.6  MCV 86.5  PLT 272    Studies/Results: Dg Chest 2 View  07/06/2014   CLINICAL DATA:  Stroke.  EXAM: CHEST  2 VIEW  COMPARISON:  Chest radiograph 04/12/2014  FINDINGS: Stable cardiac and mediastinal contours with tortuosity of the thoracic aorta. No consolidative  pulmonary opacities. Pleural effusion pneumothorax. Minimal bibasilar atelectasis and or scarring. Regional skeleton is unremarkable.  IMPRESSION: No acute cardiopulmonary process.   Electronically Signed   By: Lovey Newcomer M.D.   On: 07/06/2014 21:31   Ct Head Wo Contrast  07/06/2014   CLINICAL DATA:  Left-sided numbness.  EXAM: CT HEAD WITHOUT CONTRAST  TECHNIQUE: Contiguous axial images were obtained from the base of the skull through the vertex without intravenous contrast.  COMPARISON:  05/22/2004.  FINDINGS: No mass. No hydrocephalus. No hemorrhage. Subtle lucency noted of region posteriorly of the right internal capsule. Small recurrent for cannot be excluded, age undetermined. Seven seen in the left frontal periventricular white matter. These changes may be related to chronic white matter ischemia. No acute bony abnormality. Mucosal thickening noted of the sphenoid sinus.  IMPRESSION: 1. Subtle lucencies noted posteriorly in the right internal capsule and anteriorly in the left frontal periventricular white matter. These may represent subtle areas of ischemia, age undetermined.  2.  Mucosal thickening sphenoid sinus.   Electronically Signed   By: Marcello Moores  Register   On: 07/06/2014 09:18   Mr Jodene Nam Head Wo Contrast  07/06/2014   CLINICAL DATA:  58 year old female who awoke with left facial and extremity numbness. Tingling. Initial encounter.  EXAM: MRI HEAD WITHOUT CONTRAST  MRA HEAD WITHOUT CONTRAST  TECHNIQUE:  Multiplanar, multiecho pulse sequences of the brain and surrounding structures were obtained without intravenous contrast. Angiographic images of the head were obtained using MRA technique without contrast.  COMPARISON:  Head CT without contrast 0912 hr the same day, 05/22/2004.  FINDINGS: MRI HEAD FINDINGS  9 mm oval focus of restricted diffusion in the lateral right thalamus bordering the posterior limb of the right internal capsule. Mild associated T2 and FLAIR hyperintensity. No mass effect or  associated hemorrhage.  No other areas of restricted diffusion. Major intracranial vascular flow voids are preserved.  Cerebral volume is normal. No midline shift, mass effect, evidence of mass lesion, ventriculomegaly, extra-axial collection or acute intracranial hemorrhage. Cervicomedullary junction and pituitary are within normal limits. Negative visualized cervical spine. Scattered cerebral white matter T2 and FLAIR hyperintense foci, Mild to moderate for age and slightly greater in the left hemisphere, specially long anterior limb of the left external capsule. No cortical encephalomalacia identified. Deep gray matter nuclei other than right thalamus are within normal limits. Brainstem and cerebellum are within normal limits. No chronic hemorrhage identified in the brain.  Visible internal auditory structures appear normal. Mastoids are clear. Left paranasal sinus mucosal thickening, other paranasal sinuses are clear. Visualized scalp soft tissues are within normal limits. Visualized bone marrow signal is within normal limits. 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 Otherwise negative orbits soft tissues.  MRA HEAD FINDINGS  Somewhat diminutive vertebrobasilar system owing 2 bilateral fetal type PCA origins. Codominant distal vertebral arteries are patent. Patent vertebrobasilar junction. No basilar or arteries stenosis. Fetal type PCA origins, both posterior communicating arteries are tortuous slightly greater on the left. SCA origins are patent. Bilateral PCA branches are within normal limits. No PCA irregularity.  Antegrade flow in both ICA siphons. No siphon stenosis. Ophthalmic artery origins are not well visualized. The left cavernous ICA is asymmetrically tortuous. Patent carotid termini. Normal MCA and ACA origins. Anterior communicating artery and visualized bilateral ACA branches are within normal limits. Visualized bilateral MCA branches are within normal limits.  IMPRESSION: 1. Small  acute lacunar infarct in the lateral right thalamus near the posterior limb of the right internal capsule. 2. Negative intracranial MRA; fetal type PCA origins, left ICA and posterior communicating artery tortuosity.   Electronically Signed   By: Lars Pinks M.D.   On: 07/06/2014 13:34   Mr Brain Wo Contrast  07/06/2014   CLINICAL DATA:  58 year old female who awoke with left facial and extremity numbness. Tingling. Initial encounter.  EXAM: MRI HEAD WITHOUT CONTRAST  MRA HEAD WITHOUT CONTRAST  TECHNIQUE: Multiplanar, multiecho pulse sequences of the brain and surrounding structures were obtained without intravenous contrast. Angiographic images of the head were obtained using MRA technique without contrast.  COMPARISON:  Head CT without contrast 0912 hr the same day, 05/22/2004.  FINDINGS: MRI HEAD FINDINGS  9 mm oval focus of restricted diffusion in the lateral right thalamus bordering the posterior limb of the right internal capsule. Mild associated T2 and FLAIR hyperintensity. No mass effect or associated hemorrhage.  No other areas of restricted diffusion. Major intracranial vascular flow voids are preserved.  Cerebral volume is normal. No midline shift, mass effect, evidence of mass lesion, ventriculomegaly, extra-axial collection or acute intracranial hemorrhage. Cervicomedullary junction and pituitary are within normal limits. Negative visualized cervical spine. Scattered cerebral white matter T2 and FLAIR hyperintense foci, Mild to moderate for age and slightly greater in the left hemisphere, specially long anterior limb of the left external capsule. No cortical encephalomalacia identified. Deep gray  matter nuclei other than right thalamus are within normal limits. Brainstem and cerebellum are within normal limits. No chronic hemorrhage identified in the brain.  Visible internal auditory structures appear normal. Mastoids are clear. Left paranasal sinus mucosal thickening, other paranasal sinuses are  clear. Visualized scalp soft tissues are within normal limits. Visualized bone marrow signal is within normal limits. 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 Otherwise negative orbits soft tissues.  MRA HEAD FINDINGS  Somewhat diminutive vertebrobasilar system owing 2 bilateral fetal type PCA origins. Codominant distal vertebral arteries are patent. Patent vertebrobasilar junction. No basilar or arteries stenosis. Fetal type PCA origins, both posterior communicating arteries are tortuous slightly greater on the left. SCA origins are patent. Bilateral PCA branches are within normal limits. No PCA irregularity.  Antegrade flow in both ICA siphons. No siphon stenosis. Ophthalmic artery origins are not well visualized. The left cavernous ICA is asymmetrically tortuous. Patent carotid termini. Normal MCA and ACA origins. Anterior communicating artery and visualized bilateral ACA branches are within normal limits. Visualized bilateral MCA branches are within normal limits.  IMPRESSION: 1. Small acute lacunar infarct in the lateral right thalamus near the posterior limb of the right internal capsule. 2. Negative intracranial MRA; fetal type PCA origins, left ICA and posterior communicating artery tortuosity.   Electronically Signed   By: Lars Pinks M.D.   On: 07/06/2014 13:34    Medications:  Scheduled: . aspirin  325 mg Oral Daily  . atorvastatin  10 mg Oral q1800  . enoxaparin (LOVENOX) injection  40 mg Subcutaneous Q24H  . [START ON 07/08/2014] Influenza vac split quadrivalent PF  0.5 mL Intramuscular Tomorrow-1000  . loratadine  10 mg Oral Daily  . [START ON 07/08/2014] pneumococcal 23 valent vaccine  0.5 mL Intramuscular Tomorrow-1000   Continuous:  FXT:[id number], hydrALAZINE, HYDROcodone-acetaminophen  Assessment/Plan:  Active Problems:   CVA (cerebral infarction)   Essential hypertension, benign   Marijuana abuse   Other and unspecified hyperlipidemia    Acute CVA involving  Right hemisphere Stroke work up in progress. Neurology following and managing. On Aspirin. Statin. PT/OT/SLP. Will need to be counseled to stop illicit drug use.  Essential Hypertension Allow permissive hypertension. Resume home medications in 24 hrs.  Hyperlipidemia LDL not at goal. Started on statin.  Marijuana Use Will need counseling.  DVT Prophylaxis: Enoxaparin    Code Status: Full Code  Family Communication: Discussed with patient and her husband.  Disposition Plan: Await stroke work up to be completed.    LOS: 1 day   [facility] Pager [phone] 07/07/2014, 11:31 AM  If 8PM-8AM, please contact night-coverage at [email or web address], password [facility]

## 2014-07-07 NOTE — ED Provider Notes (Signed)
Medical screening examination/treatment/procedure(s) were conducted as a shared visit with non-physician practitioner(s) and myself.  I personally evaluated the patient during the encounter.   EKG Interpretation None        Houston Siren III, MD 07/07/14 [phone]

## 2014-07-07 NOTE — Progress Notes (Signed)
UR complete.  Manpreet Strey RN, MSN 

## 2014-07-07 NOTE — Progress Notes (Signed)
STROKE TEAM PROGRESS NOTE   HISTORY Brittany Crawford is an 57 y.o. female with known chronic back pain and right foot weakness which causes some gait instability. Patient also has not been taking her BP medication as directed over the past two months. Patient states she awoke on Monday morning (4 days ago, 07/03/2014, time unknown) and noted she had a funny feeling in her left foot, leg, arm and face. She felt as it was decreased. She denies any other symptoms such as weakness, blurred or double vision, dysarthria or dysphagia. Currently she continues to have subjective decreased sensation on her left face, tongue, throat, arm and leg. Patient was not administered TPA secondary to delay in arrival. She was admitted for further evaluation and treatment.   SUBJECTIVE (INTERVAL HISTORY) No family is at the bedside.  Overall she feels her condition is stable. She has no known prior history of strokes or TIAs. She did not seek image and medical help and the symptoms began 4 days ago   OBJECTIVE Temp:  [97.9 F (36.6 C)-99 F (37.2 C)] 98.3 F (36.8 C) (09/25 1055) Pulse Rate:  [60-133] 67 (09/25 1055) Cardiac Rhythm:  [-] Normal sinus rhythm (09/24 1630) Resp:  [18-20] 20 (09/25 1055) BP: (117-164)/(58-112) 134/96 mmHg (09/25 1055) SpO2:  [97 %-100 %] 100 % (09/25 1055)  No results found for this basename: GLUCAP,  in the last 168 hours  Recent Labs Lab 07/06/14 0849  NA 139  K 3.7  CL 101  CO2 24  GLUCOSE 117*  BUN 10  CREATININE 0.94  CALCIUM 10.1   No results found for this basename: AST, ALT, ALKPHOS, BILITOT, PROT, ALBUMIN,  in the last 168 hours  Recent Labs Lab 07/06/14 0849  WBC 4.6  NEUTROABS 1.9  HGB 13.2  HCT 38.6  MCV 86.5  PLT 272   No results found for this basename: CKTOTAL, CKMB, CKMBINDEX, TROPONINI,  in the last 168 hours No results found for this basename: LABPROT, INR,  in the last 72 hours  Recent Labs  07/06/14 0850  COLORURINE YELLOW  LABSPEC  1.020  PHURINE 5.5  GLUCOSEU NEGATIVE  HGBUR NEGATIVE  BILIRUBINUR NEGATIVE  KETONESUR NEGATIVE  PROTEINUR NEGATIVE  UROBILINOGEN 0.2  NITRITE NEGATIVE  LEUKOCYTESUR SMALL*       Component Value Date/Time   CHOL 192 07/07/2014 0800   TRIG 142 07/07/2014 0800   HDL 62 07/07/2014 0800   CHOLHDL 3.1 07/07/2014 0800   VLDL 28 07/07/2014 0800   LDLCALC 102* 07/07/2014 0800   No results found for this basename: HGBA1C      Component Value Date/Time   LABOPIA POSITIVE* 07/06/2014 2112   COCAINSCRNUR NONE DETECTED 07/06/2014 2112   LABBENZ NONE DETECTED 07/06/2014 2112   AMPHETMU NONE DETECTED 07/06/2014 2112   THCU POSITIVE* 07/06/2014 2112   LABBARB NONE DETECTED 07/06/2014 2112    No results found for this basename: ETH,  in the last 168 hours  Dg Chest 2 View  07/06/2014   CLINICAL DATA:  Stroke.  EXAM: CHEST  2 VIEW  COMPARISON:  Chest radiograph 04/12/2014  FINDINGS: Stable cardiac and mediastinal contours with tortuosity of the thoracic aorta. No consolidative pulmonary opacities. Pleural effusion pneumothorax. Minimal bibasilar atelectasis and or scarring. Regional skeleton is unremarkable.  IMPRESSION: No acute cardiopulmonary process.   Electronically Signed   By: Lovey Newcomer M.D.   On: 07/06/2014 21:31   Ct Head Wo Contrast  07/06/2014   CLINICAL DATA:  Left-sided numbness.  EXAM: CT  HEAD WITHOUT CONTRAST  TECHNIQUE: Contiguous axial images were obtained from the base of the skull through the vertex without intravenous contrast.  COMPARISON:  05/22/2004.  FINDINGS: No mass. No hydrocephalus. No hemorrhage. Subtle lucency noted of region posteriorly of the right internal capsule. Small recurrent for cannot be excluded, age undetermined. Seven seen in the left frontal periventricular white matter. These changes may be related to chronic white matter ischemia. No acute bony abnormality. Mucosal thickening noted of the sphenoid sinus.  IMPRESSION: 1. Subtle lucencies noted posteriorly in the  right internal capsule and anteriorly in the left frontal periventricular white matter. These may represent subtle areas of ischemia, age undetermined.  2.  Mucosal thickening sphenoid sinus.   Electronically Signed   By: Marcello Moores  Register   On: 07/06/2014 09:18   Mr Jodene Nam Head Wo Contrast  07/06/2014   CLINICAL DATA:  58 year old female who awoke with left facial and extremity numbness. Tingling. Initial encounter.  EXAM: MRI HEAD WITHOUT CONTRAST  MRA HEAD WITHOUT CONTRAST  TECHNIQUE: Multiplanar, multiecho pulse sequences of the brain and surrounding structures were obtained without intravenous contrast. Angiographic images of the head were obtained using MRA technique without contrast.  COMPARISON:  Head CT without contrast 0912 hr the same day, 05/22/2004.  FINDINGS: MRI HEAD FINDINGS  9 mm oval focus of restricted diffusion in the lateral right thalamus bordering the posterior limb of the right internal capsule. Mild associated T2 and FLAIR hyperintensity. No mass effect or associated hemorrhage.  No other areas of restricted diffusion. Major intracranial vascular flow voids are preserved.  Cerebral volume is normal. No midline shift, mass effect, evidence of mass lesion, ventriculomegaly, extra-axial collection or acute intracranial hemorrhage. Cervicomedullary junction and pituitary are within normal limits. Negative visualized cervical spine. Scattered cerebral white matter T2 and FLAIR hyperintense foci, Mild to moderate for age and slightly greater in the left hemisphere, specially long anterior limb of the left external capsule. No cortical encephalomalacia identified. Deep gray matter nuclei other than right thalamus are within normal limits. Brainstem and cerebellum are within normal limits. No chronic hemorrhage identified in the brain.  Visible internal auditory structures appear normal. Mastoids are clear. Left paranasal sinus mucosal thickening, other paranasal sinuses are clear. Visualized scalp  soft tissues are within normal limits. Visualized bone marrow signal is within normal limits. 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 Otherwise negative orbits soft tissues.  MRA HEAD FINDINGS  Somewhat diminutive vertebrobasilar system owing 2 bilateral fetal type PCA origins. Codominant distal vertebral arteries are patent. Patent vertebrobasilar junction. No basilar or arteries stenosis. Fetal type PCA origins, both posterior communicating arteries are tortuous slightly greater on the left. SCA origins are patent. Bilateral PCA branches are within normal limits. No PCA irregularity.  Antegrade flow in both ICA siphons. No siphon stenosis. Ophthalmic artery origins are not well visualized. The left cavernous ICA is asymmetrically tortuous. Patent carotid termini. Normal MCA and ACA origins. Anterior communicating artery and visualized bilateral ACA branches are within normal limits. Visualized bilateral MCA branches are within normal limits.  IMPRESSION: 1. Small acute lacunar infarct in the lateral right thalamus near the posterior limb of the right internal capsule. 2. Negative intracranial MRA; fetal type PCA origins, left ICA and posterior communicating artery tortuosity.   Electronically Signed   By: Lars Pinks M.D.   On: 07/06/2014 13:34   Mr Brain Wo Contrast  07/06/2014   CLINICAL DATA:  58 year old female who awoke with left facial and extremity numbness. Tingling. Initial encounter.  EXAM: MRI HEAD WITHOUT CONTRAST  MRA HEAD WITHOUT CONTRAST  TECHNIQUE: Multiplanar, multiecho pulse sequences of the brain and surrounding structures were obtained without intravenous contrast. Angiographic images of the head were obtained using MRA technique without contrast.  COMPARISON:  Head CT without contrast 0912 hr the same day, 05/22/2004.  FINDINGS: MRI HEAD FINDINGS  9 mm oval focus of restricted diffusion in the lateral right thalamus bordering the posterior limb of the right internal capsule.  Mild associated T2 and FLAIR hyperintensity. No mass effect or associated hemorrhage.  No other areas of restricted diffusion. Major intracranial vascular flow voids are preserved.  Cerebral volume is normal. No midline shift, mass effect, evidence of mass lesion, ventriculomegaly, extra-axial collection or acute intracranial hemorrhage. Cervicomedullary junction and pituitary are within normal limits. Negative visualized cervical spine. Scattered cerebral white matter T2 and FLAIR hyperintense foci, Mild to moderate for age and slightly greater in the left hemisphere, specially long anterior limb of the left external capsule. No cortical encephalomalacia identified. Deep gray matter nuclei other than right thalamus are within normal limits. Brainstem and cerebellum are within normal limits. No chronic hemorrhage identified in the brain.  Visible internal auditory structures appear normal. Mastoids are clear. Left paranasal sinus mucosal thickening, other paranasal sinuses are clear. Visualized scalp soft tissues are within normal limits. Visualized bone marrow signal is within normal limits. 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 Otherwise negative orbits soft tissues.  MRA HEAD FINDINGS  Somewhat diminutive vertebrobasilar system owing 2 bilateral fetal type PCA origins. Codominant distal vertebral arteries are patent. Patent vertebrobasilar junction. No basilar or arteries stenosis. Fetal type PCA origins, both posterior communicating arteries are tortuous slightly greater on the left. SCA origins are patent. Bilateral PCA branches are within normal limits. No PCA irregularity.  Antegrade flow in both ICA siphons. No siphon stenosis. Ophthalmic artery origins are not well visualized. The left cavernous ICA is asymmetrically tortuous. Patent carotid termini. Normal MCA and ACA origins. Anterior communicating artery and visualized bilateral ACA branches are within normal limits. Visualized bilateral  MCA branches are within normal limits.  IMPRESSION: 1. Small acute lacunar infarct in the lateral right thalamus near the posterior limb of the right internal capsule. 2. Negative intracranial MRA; fetal type PCA origins, left ICA and posterior communicating artery tortuosity.   Electronically Signed   By: Lars Pinks M.D.   On: 07/06/2014 13:34     PHYSICAL EXAM Pleasant middle aged lady not in distress.Awake alert. Afebrile. Head is nontraumatic. Neck is supple without bruit. Hearing is normal. Cardiac exam no murmur or gallop. Lungs are clear to auscultation. Distal pulses are well felt. Neurological Exam :   Awake  Alert oriented x 3. Normal speech and language.eye movements full without nystagmus.fundi were not visualized. Vision acuity and fields appear normal. Hearing is normal. Palatal movements are normal. Face symmetric. Tongue midline. Normal strength, tone, reflexes and coordination. Normal sensation. Gait deferred. ASSESSMENT/PLAN  Brittany Crawford is a 58 y.o. female with history of known chronic back pain and right foot weakness which causes some gait instability presenting with left sided numbness. She did not receive IV t-PA due to delay in arrival. MRI imaging confirms a right lateral thalamic lacunar infarct.   Stroke:   right lateral thalamic lacunar infarct secondary to small vessel disease      aspirin 325 mg orally every day prior to admission, now on aspirin 325 mg orally every day  MRI  Right lateral thalamic infarct  MRA  Negative for acute abnormality  Carotid Doppler  pending   2D Echo  pending   HgbA1c pending    Lovenox 40 mg sq daily for VTE prophylaxis  Cardiac thin liquids.   Bedrest discontinued, ok to be Up with assistance  Resultant subjective left paresthesias  Therapy recommendations:  pending   Ongoing aggressive risk factor management  Risk factor education  Patient counseled to be compliant with her antithrombotic  medications  Disposition:  Anticipate discharge home later today once testing completed  Follow up Dr. Leonie Man, stroke clinic, 2 months  Hypertension   Not taking medications as prescribed prior to admission  Permissive hypertension <220/120 for 24-48 hours and then gradually normalize within 5-7 days  BP goal long term normotensive BP 117-164/95-96 past 24h (07/07/2014 @ 11:18 AM)  Stable  Patient counseled to be compliant with her blood pressure medications  Hyperlipidemia  Home meds:  None   LDL 102, goal < 100 (<70 for diabetics)  Add low dose statin lipitor 10 daily, continue at discharge  Other Stroke Risk Factors Cigarette smoker, advised to stop smoking ETOH use UDS positive for opiate and THC, advised to stop using marijuana   Family hx stroke (mother)  Other Active Problems  sarcoidosis  Hospital day # 1  SHARON BIBY, MSN, RN, ANVP-BC, ANP-BC, [facility] Pager: [phone] 07/07/2014 11:22 AM   I have personally examined this patient, reviewed notes, independently viewed imaging studies, participated in medical decision making and plan of care. I have made any additions or clarifications directly to the above note. Agree with note above.    Antony Contras, MD Medical Director [facility] Pager: [phone] 07/07/2014 4:15 PM   To contact Stroke Continuity provider, please refer to [email or web address] After hours, contact General Neurology

## 2014-07-07 NOTE — Evaluation (Signed)
Physical Therapy Evaluation Patient Details Name: Tanaisha Pittman MRN: [id number] DOB: 1956-08-21 Today's Date: 07/07/2014   History of Present Illness  58 yo female admitted  for HTN and inconsistently taking BP medication. MRI (+) Rt lateral thalamic lunar infarct. PMH: HTN chronic back pain  Clinical Impression  Pt is at an independent level and is completely steady except with unilateral stance activity.  No further PT needs.  Will sign off.    Follow Up Recommendations No PT follow up    Equipment Recommendations  None recommended by PT    Recommendations for Other Services       Precautions / Restrictions Precautions Precautions: None Precaution Comments: Rt visual cut at baseline      Mobility  Bed Mobility Overal bed mobility: Independent                Transfers Overall transfer level: Modified independent                  Ambulation/Gait Ambulation/Gait assistance: Independent Ambulation Distance (Feet): 300 Feet Assistive device: None Gait Pattern/deviations: WFL(Within Functional Limits) Gait velocity: functional, but slower than age appropriate   General Gait Details: Steady and generally fluid of movement  Stairs Stairs: Yes Stairs assistance: Modified independent (Device/Increase time) Stair Management: One rail Right;Alternating pattern;Forwards Number of Stairs: 5 General stair comments: safe with rail  Wheelchair Mobility    Modified Rankin (Stroke Patients Only) Modified Rankin (Stroke Patients Only) Pre-Morbid Rankin Score: No symptoms Modified Rankin: No significant disability     Balance Overall balance assessment: Needs assistance Sitting-balance support: No upper extremity supported Sitting balance-Leahy Scale: Normal     Standing balance support: No upper extremity supported Standing balance-Leahy Scale: Normal                   Standardized Balance Assessment Standardized Balance Assessment : Berg  Balance Test Berg Balance Test Sit to Stand: Able to stand without using hands and stabilize independently Standing Unsupported: Able to stand safely 2 minutes Sitting with Back Unsupported but Feet Supported on Floor or Stool: Able to sit safely and securely 2 minutes Stand to Sit: Sits safely with minimal use of hands Transfers: Able to transfer safely, minor use of hands Standing Unsupported with Eyes Closed: Able to stand 10 seconds safely Standing Ubsupported with Feet Together: Able to place feet together independently and stand 1 minute safely From Standing, Reach Forward with Outstretched Arm: Can reach confidently >25 cm (10") From Standing Position, Pick up Object from Floor: Able to pick up shoe safely and easily From Standing Position, Turn to Look Behind Over each Shoulder: Looks behind from both sides and weight shifts well Turn 360 Degrees: Able to turn 360 degrees safely in 4 seconds or less Standing Unsupported, Alternately Place Feet on Step/Stool: Able to stand independently and safely and complete 8 steps in 20 seconds Standing Unsupported, One Foot in Front: Able to plae foot ahead of the other independently and hold 30 seconds Standing on One Leg: Able to lift leg independently and hold equal to or more than 3 seconds Total Score: 53 Dynamic Gait Index Level Surface: Normal Change in Gait Speed: Normal Gait with Horizontal Head Turns: Normal Gait with Vertical Head Turns: Normal Gait and Pivot Turn: Normal Step Over Obstacle: Mild Impairment Step Around Obstacles: Mild Impairment Steps: Mild Impairment Total Score: 21       Pertinent Vitals/Pain Pain Assessment: No/denies pain    Home Living Family/patient expects to be discharged to::  Private residence Living Arrangements: Spouse/significant other Available Help at Discharge: Available PRN/intermittently Type of Home: House Home Access: Stairs to enter   Technical brewer of Steps: 3 Home Layout:  One level [place]: None      Prior Function Level of Independence: Independent               Hand Dominance   Dominant Hand: Right    Extremity/Trunk Assessment   Upper Extremity Assessment: Defer to OT evaluation       LUE Deficits / Details: decr sensation numbness / tingling   Lower Extremity Assessment: Overall WFL for tasks assessed;LLE deficits/detail   LLE Deficits / Details: mild muscle fatigue in MMT,  Cervical / Trunk Assessment: Other exceptions  Communication   Communication: No difficulties  Cognition Arousal/Alertness: Awake/alert Behavior During Therapy: WFL for tasks assessed/performed Overall Cognitive Status: Within Functional Limits for tasks assessed                      General Comments      Exercises        Assessment/Plan    PT Assessment Patent does not need any further PT services  PT Diagnosis     PT Problem List    PT Treatment Interventions     PT Goals (Current goals can be found in the Care Plan section) Acute Rehab PT Goals PT Goal Formulation: No goals set, d/c therapy    Frequency     Barriers to discharge        Co-evaluation               End of Session   Activity Tolerance: Patient tolerated treatment well Patient left: in chair Nurse Communication: Mobility status         Time: 3716-9678 PT Time Calculation (min): 19 min   Charges:   PT Evaluation $Initial PT Evaluation Tier I: 1 Procedure PT Treatments $Gait Training: 8-22 mins   PT G Codes:          Alecxander Mainwaring, Tessie Fass 07/07/2014, 3:25 PM 07/07/2014  Donnella Sham, PT [phone]  (pager)

## 2014-07-07 NOTE — Evaluation (Signed)
Occupational Therapy Evaluation Patient Details Name: Brittany Crawford MRN: [id number] DOB: 1955/12/20 Today's Date: 07/07/2014    History of Present Illness 58 yo female admitted  for HTN and inconsistently taking BP medication. MRI (+) Rt lateral thalamic lunar infarct. PMH: HTN chronic back pain   Clinical Impression   Patient evaluated by Occupational Therapy with no further acute OT needs identified. All education has been completed and the patient has no further questions. See below for any follow-up Occupational Therapy or equipment needs. OT to sign off. Thank you for referral.   Pt at or near baseline.     Follow Up Recommendations  No OT follow up    Equipment Recommendations  None recommended by OT    Recommendations for Other Services       Precautions / Restrictions Precautions Precautions: None Precaution Comments: Rt visual cut at baseline      Mobility Bed Mobility Overal bed mobility: Independent                Transfers Overall transfer level: Modified independent                    Balance                                 Standardized Balance Assessment Standardized Balance Assessment : Dynamic Gait Index   Dynamic Gait Index Level Surface: Normal Change in Gait Speed: Normal Gait with Horizontal Head Turns: Normal Gait with Vertical Head Turns: Normal Gait and Pivot Turn: Normal Step Over Obstacle: Mild Impairment Step Around Obstacles: Mild Impairment      ADL Overall ADL's : At baseline;Modified independent                                       General ADL Comments: Pt demonstrates bil LE dressing, tub transfer with shower seat and DGI     Vision                     Perception     Praxis      Pertinent Vitals/Pain Pain Assessment: No/denies pain     Hand Dominance Right   Extremity/Trunk Assessment Upper Extremity Assessment Upper Extremity Assessment: LUE  deficits/detail;Overall WFL for tasks assessed LUE Deficits / Details: decr sensation numbness / tingling   Lower Extremity Assessment Lower Extremity Assessment: Defer to PT evaluation   Cervical / Trunk Assessment Cervical / Trunk Assessment: Other exceptions Cervical / Trunk Exceptions: sarcoidosis   Communication Communication Communication: No difficulties   Cognition Arousal/Alertness: Awake/alert Behavior During Therapy: WFL for tasks assessed/performed Overall Cognitive Status: Within Functional Limits for tasks assessed                     General Comments       Exercises       Shoulder Instructions      Home Living Family/patient expects to be discharged to:: Private residence Living Arrangements: Spouse/significant other   Type of Home: House Home Access: Stairs to enter Technical brewer of Steps: 3   Home Layout: One level     Bathroom Shower/Tub: Tub/shower unit Shower/tub characteristics: Architectural technologist: Standard     Home Equipment: None          Prior Functioning/Environment Level of Independence: Independent  OT Diagnosis:     OT Problem List:     OT Treatment/Interventions:      OT Goals(Current goals can be found in the care plan section)    OT Frequency:     Barriers to D/C:            Co-evaluation              End of Session Equipment Utilized During Treatment: Gait belt  Activity Tolerance: Patient tolerated treatment well Patient left: in chair;with call bell/phone within reach   Time: 1145-1158 OT Time Calculation (min): 13 min Charges:  OT General Charges $OT Visit: 1 Procedure OT Evaluation $Initial OT Evaluation Tier I: 1 Procedure OT Treatments $Self Care/Home Management : 8-22 mins G-Codes:    Peri Maris Aug 02, 2014, 12:59 PM Pager: [phone]

## 2014-07-07 NOTE — Progress Notes (Signed)
  Echocardiogram 2D Echocardiogram has been performed.  Brittany Crawford 07/07/2014, 10:09 AM

## 2014-07-08 LAB — CBC
HEMATOCRIT: 38.6 % (ref 36.0–46.0)
Hemoglobin: 13 g/dL (ref 12.0–15.0)
MCH: 30.4 pg (ref 26.0–34.0)
MCHC: 33.7 g/dL (ref 30.0–36.0)
MCV: 90.2 fL (ref 78.0–100.0)
Platelets: 255 10*3/uL (ref 150–400)
RBC: 4.28 MIL/uL (ref 3.87–5.11)
RDW: 14.4 % (ref 11.5–15.5)
WBC: 4.6 10*3/uL (ref 4.0–10.5)

## 2014-07-08 LAB — BASIC METABOLIC PANEL
Anion gap: 15 (ref 5–15)
BUN: 12 mg/dL (ref 6–23)
CHLORIDE: 103 meq/L (ref 96–112)
CO2: 24 meq/L (ref 19–32)
CREATININE: 0.82 mg/dL (ref 0.50–1.10)
Calcium: 9.4 mg/dL (ref 8.4–10.5)
GFR calc non Af Amer: 79 mL/min — ABNORMAL LOW (ref >90)
GLUCOSE: 124 mg/dL — AB (ref 70–99)
Potassium: 3.5 mEq/L — ABNORMAL LOW (ref 3.7–5.3)
Sodium: 142 mEq/L (ref 137–147)

## 2014-07-08 MED ORDER — POTASSIUM CHLORIDE CRYS ER 20 MEQ PO TBCR
40.0000 meq | EXTENDED_RELEASE_TABLET | Freq: Once | ORAL | Status: AC
  Administered 2014-07-08: 40 meq via ORAL
  Filled 2014-07-08: qty 2

## 2014-07-08 MED ORDER — TRIAMCINOLONE ACETONIDE 0.1 % EX CREA: 1.0000 "application " | TOPICAL_CREAM | Freq: Every evening | CUTANEOUS | Status: DC | PRN

## 2014-07-08 NOTE — Progress Notes (Signed)
Patient is discharged from room 4N01 at this time. Alert and in stable condition. IV site d/c'd as well as tele. Instructions read to patient and understanding verbalized. Ambulate out of unit with husband and belongings at side.

## 2014-07-08 NOTE — Progress Notes (Signed)
VASCULAR LAB PRELIMINARY  PRELIMINARY  PRELIMINARY  PRELIMINARY  Carotid Dopplers completed.    Preliminary report:  1-39% ICA stenosis.  Vertebral artery flow is antegrade.   Jadence Kinlaw, RVT 07/08/2014, 11:10 AM

## 2014-07-08 NOTE — Discharge Summary (Signed)
[facility]  Physician Discharge Summary   Patient ID: Brittany Crawford MRN: [id number] DOB/AGE: 1956/02/05 58 y.o.  Admit date: 07/06/2014 Discharge date: 07/08/2014  PCP: Salena Saner., MD  DISCHARGE DIAGNOSES:  Active Problems:   CVA (cerebral infarction)   Essential hypertension, benign   Marijuana abuse   Other and unspecified hyperlipidemia   RECOMMENDATIONS FOR OUTPATIENT FOLLOW UP: 1. Needs close f/u with PCP  DISCHARGE CONDITION: fair  Diet recommendation: Heart Healthy  Filed Weights   07/06/14 0823  Weight: 79.379 kg (175 lb)    INITIAL HISTORY: 57yo with PMH as below presented with left sided numbness and weakness. She was found to have an acute stroke and was admitted for further work up.  Consultations:  Neurology  Procedures: 2 D ECHO  Study Conclusions - Left ventricle: The cavity size was normal. Wall thickness was increased in a pattern of mild LVH. Systolic function was normal. Wall motion was normal; there were no regional wall motion abnormalities. Doppler parameters are consistent with abnormal left ventricular relaxation (grade 1 diastolic dysfunction). - Ascending aorta: The ascending aorta was mildly dilated. - Atrial septum: There was an atrial septal aneurysm. Impressions: - Normal LV function; grade 1 diastolic dysfunction; atrial septal aneurysm.  Carotid Doppler  Preliminary report: 1-39% ICA stenosis. Vertebral artery flow is antegrade.    HOSPITAL COURSE:   Acute CVA involving Right hemisphere  Stroke work up was initiated. Patient was seen by neurology. She underwent MRI as reported below. She underwent 2-D echocardiogram, which showed normal systolic function. Incidental finding of atrial septal aneurysm was noted. This was discussed with Dr. Leonie Man with neurology, and he does not recommend further evaluation. The stroke was not considered to be embolic in any case. At this time the recommendation is for the patient  to continue aspirin. Statin was also initiated for LDL of 102. She was seen by physical therapy and occupational therapy. She did not have any therapy requirements. She was counseled regarding her illicit drug use.   Essential Hypertension  She was allowed permissive hypertension. Home medications can be resumed at this time.   Hyperlipidemia  LDL not at goal. Started on statin.   Marijuana Use  She was counseled here. Will require further counseling as an outpatient.   Overall patient is stable for discharge.   PERTINENT LABS:  The results of significant diagnostics from this hospitalization (including imaging, microbiology, ancillary and laboratory) are listed below for reference.    Labs: Basic Metabolic Panel:  Recent Labs Lab 07/06/14 0849 07/08/14 0540  NA 139 142  K 3.7 3.5*  CL 101 103  CO2 24 24  GLUCOSE 117* 124*  BUN 10 12  CREATININE 0.94 0.82  CALCIUM 10.1 9.4   CBC:  Recent Labs Lab 07/06/14 0849 07/08/14 0540  WBC 4.6 4.6  NEUTROABS 1.9  --   HGB 13.2 13.0  HCT 38.6 38.6  MCV 86.5 90.2  PLT 272 255    IMAGING STUDIES Dg Chest 2 View  07/06/2014   CLINICAL DATA:  Stroke.  EXAM: CHEST  2 VIEW  COMPARISON:  Chest radiograph 04/12/2014  FINDINGS: Stable cardiac and mediastinal contours with tortuosity of the thoracic aorta. No consolidative pulmonary opacities. Pleural effusion pneumothorax. Minimal bibasilar atelectasis and or scarring. Regional skeleton is unremarkable.  IMPRESSION: No acute cardiopulmonary process.   Electronically Signed   By: Lovey Newcomer M.D.   On: 07/06/2014 21:31   Ct Head Wo Contrast  07/06/2014   CLINICAL DATA:  Left-sided numbness.  EXAM: CT HEAD WITHOUT CONTRAST  TECHNIQUE: Contiguous axial images were obtained from the base of the skull through the vertex without intravenous contrast.  COMPARISON:  05/22/2004.  FINDINGS: No mass. No hydrocephalus. No hemorrhage. Subtle lucency noted of region posteriorly of the right  internal capsule. Small recurrent for cannot be excluded, age undetermined. Seven seen in the left frontal periventricular white matter. These changes may be related to chronic white matter ischemia. No acute bony abnormality. Mucosal thickening noted of the sphenoid sinus.  IMPRESSION: 1. Subtle lucencies noted posteriorly in the right internal capsule and anteriorly in the left frontal periventricular white matter. These may represent subtle areas of ischemia, age undetermined.  2.  Mucosal thickening sphenoid sinus.   Electronically Signed   By: Marcello Moores  Register   On: 07/06/2014 09:18   Mr Jodene Nam Head Wo Contrast  07/06/2014   CLINICAL DATA:  58 year old female who awoke with left facial and extremity numbness. Tingling. Initial encounter.  EXAM: MRI HEAD WITHOUT CONTRAST  MRA HEAD WITHOUT CONTRAST  TECHNIQUE: Multiplanar, multiecho pulse sequences of the brain and surrounding structures were obtained without intravenous contrast. Angiographic images of the head were obtained using MRA technique without contrast.  COMPARISON:  Head CT without contrast 0912 hr the same day, 05/22/2004.  FINDINGS: MRI HEAD FINDINGS  9 mm oval focus of restricted diffusion in the lateral right thalamus bordering the posterior limb of the right internal capsule. Mild associated T2 and FLAIR hyperintensity. No mass effect or associated hemorrhage.  No other areas of restricted diffusion. Major intracranial vascular flow voids are preserved.  Cerebral volume is normal. No midline shift, mass effect, evidence of mass lesion, ventriculomegaly, extra-axial collection or acute intracranial hemorrhage. Cervicomedullary junction and pituitary are within normal limits. Negative visualized cervical spine. Scattered cerebral white matter T2 and FLAIR hyperintense foci, Mild to moderate for age and slightly greater in the left hemisphere, specially long anterior limb of the left external capsule. No cortical encephalomalacia identified. Deep  gray matter nuclei other than right thalamus are within normal limits. Brainstem and cerebellum are within normal limits. No chronic hemorrhage identified in the brain.  Visible internal auditory structures appear normal. Mastoids are clear. Left paranasal sinus mucosal thickening, other paranasal sinuses are clear. Visualized scalp soft tissues are within normal limits. Visualized bone marrow signal is within normal limits. 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 Otherwise negative orbits soft tissues.  MRA HEAD FINDINGS  Somewhat diminutive vertebrobasilar system owing 2 bilateral fetal type PCA origins. Codominant distal vertebral arteries are patent. Patent vertebrobasilar junction. No basilar or arteries stenosis. Fetal type PCA origins, both posterior communicating arteries are tortuous slightly greater on the left. SCA origins are patent. Bilateral PCA branches are within normal limits. No PCA irregularity.  Antegrade flow in both ICA siphons. No siphon stenosis. Ophthalmic artery origins are not well visualized. The left cavernous ICA is asymmetrically tortuous. Patent carotid termini. Normal MCA and ACA origins. Anterior communicating artery and visualized bilateral ACA branches are within normal limits. Visualized bilateral MCA branches are within normal limits.  IMPRESSION: 1. Small acute lacunar infarct in the lateral right thalamus near the posterior limb of the right internal capsule. 2. Negative intracranial MRA; fetal type PCA origins, left ICA and posterior communicating artery tortuosity.   Electronically Signed   By: Lars Pinks M.D.   On: 07/06/2014 13:34   Mr Brain Wo Contrast  07/06/2014   CLINICAL DATA:  58 year old female who awoke with left facial and extremity numbness. Tingling.  Initial encounter.  EXAM: MRI HEAD WITHOUT CONTRAST  MRA HEAD WITHOUT CONTRAST  TECHNIQUE: Multiplanar, multiecho pulse sequences of the brain and surrounding structures were obtained without  intravenous contrast. Angiographic images of the head were obtained using MRA technique without contrast.  COMPARISON:  Head CT without contrast 0912 hr the same day, 05/22/2004.  FINDINGS: MRI HEAD FINDINGS  9 mm oval focus of restricted diffusion in the lateral right thalamus bordering the posterior limb of the right internal capsule. Mild associated T2 and FLAIR hyperintensity. No mass effect or associated hemorrhage.  No other areas of restricted diffusion. Major intracranial vascular flow voids are preserved.  Cerebral volume is normal. No midline shift, mass effect, evidence of mass lesion, ventriculomegaly, extra-axial collection or acute intracranial hemorrhage. Cervicomedullary junction and pituitary are within normal limits. Negative visualized cervical spine. Scattered cerebral white matter T2 and FLAIR hyperintense foci, Mild to moderate for age and slightly greater in the left hemisphere, specially long anterior limb of the left external capsule. No cortical encephalomalacia identified. Deep gray matter nuclei other than right thalamus are within normal limits. Brainstem and cerebellum are within normal limits. No chronic hemorrhage identified in the brain.  Visible internal auditory structures appear normal. Mastoids are clear. Left paranasal sinus mucosal thickening, other paranasal sinuses are clear. Visualized scalp soft tissues are within normal limits. Visualized bone marrow signal is within normal limits. 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 Otherwise negative orbits soft tissues.  MRA HEAD FINDINGS  Somewhat diminutive vertebrobasilar system owing 2 bilateral fetal type PCA origins. Codominant distal vertebral arteries are patent. Patent vertebrobasilar junction. No basilar or arteries stenosis. Fetal type PCA origins, both posterior communicating arteries are tortuous slightly greater on the left. SCA origins are patent. Bilateral PCA branches are within normal limits. No PCA  irregularity.  Antegrade flow in both ICA siphons. No siphon stenosis. Ophthalmic artery origins are not well visualized. The left cavernous ICA is asymmetrically tortuous. Patent carotid termini. Normal MCA and ACA origins. Anterior communicating artery and visualized bilateral ACA branches are within normal limits. Visualized bilateral MCA branches are within normal limits.  IMPRESSION: 1. Small acute lacunar infarct in the lateral right thalamus near the posterior limb of the right internal capsule. 2. Negative intracranial MRA; fetal type PCA origins, left ICA and posterior communicating artery tortuosity.   Electronically Signed   By: Lars Pinks M.D.   On: 07/06/2014 13:34    DISCHARGE EXAMINATION: Filed Vitals:   07/07/14 1827 07/07/14 2100 07/08/14 0600 07/08/14 0936  BP: 148/84 146/86 142/80 152/81  Pulse: 74 67 69 68  Temp: 98.1 F (36.7 C) 98.2 F (36.8 C) 98.3 F (36.8 C) 98.4 F (36.9 C)  TempSrc: Oral Oral Oral Oral  Resp: 20 18 18 18   Height:      Weight:      SpO2: 97% 99% 98% 100%   General appearance: alert, cooperative, appears stated age and no distress Head: Normocephalic, without obvious abnormality, atraumatic Resp: clear to auscultation bilaterally Cardio: regular rate and rhythm, S1, S2 normal, no murmur, click, rub or gallop GI: soft, non-tender; bowel sounds normal; no masses,  no organomegaly Extremities: extremities normal, atraumatic, no cyanosis or edema  DISPOSITION: Home  Discharge Instructions   Diet - low sodium heart healthy    Complete by:  As directed      Discharge instructions    Complete by:  As directed   Please follow up with your PCP.     Increase activity slowly  Complete by:  As directed            ALLERGIES: No Known Allergies  Current Discharge Medication List    START taking these medications   Details  aspirin 325 MG tablet Take 1 tablet (325 mg total) by mouth daily. Qty: 30 tablet, Refills: 3    atorvastatin  (LIPITOR) 10 MG tablet Take 1 tablet (10 mg total) by mouth daily at 6 PM. Qty: 30 tablet, Refills: 2      CONTINUE these medications which have CHANGED   Details  triamcinolone cream (KENALOG) 0.1 % Apply 1 application topically at bedtime as needed (for rash). Qty: 30 g, Refills: 0      CONTINUE these medications which have NOT CHANGED   Details  amLODipine (NORVASC) 5 MG tablet Take 1 tablet (5 mg total) by mouth daily. Qty: 30 tablet, Refills: 2    cetirizine (ZYRTEC) 10 MG tablet Take 10 mg by mouth daily as needed for allergies.    HYDROcodone-acetaminophen (NORCO) 7.5-325 MG per tablet Take 1 tablet by mouth every 6 (six) hours as needed for moderate pain.    Hypromellose (ARTIFICIAL TEARS OP) Place 1 drop into the right eye daily as needed (for dry eyes).    meloxicam (MOBIC) 15 MG tablet Take 15 mg by mouth daily as needed for pain.     Multiple Vitamins-Minerals (WOMENS ONE DAILY PO) Take 1 tablet by mouth daily.    prednisoLONE acetate (PRED FORTE) 1 % ophthalmic suspension Place 1 drop into the left eye as needed (flare ups).     telmisartan-hydrochlorothiazide (MICARDIS HCT) 40-12.5 MG per tablet Take 1 tablet by mouth daily.    fluticasone (FLONASE) 50 MCG/ACT nasal spray Place 2 sprays into both nostrils daily as needed for allergies or rhinitis.      STOP taking these medications     doxycycline (VIBRAMYCIN) 100 MG capsule        Follow-up Information   Follow up with SETHI,PRAMOD, MD. Schedule an appointment as soon as possible for a visit in 2 months. (Stroke Clinic)    Specialties:  Neurology, Radiology   Contact information:   [address] [place] [address] [phone]       Follow up with Salena Saner., MD. Schedule an appointment as soon as possible for a visit in 1 week. (post hospitalization follow up)    Specialty:  Internal Medicine   Contact information:   [place]  [id number] [phone]       TOTAL DISCHARGE TIME: 35 mins  [facility] Pager [phone]  07/08/2014, 11:36 AM

## 2014-07-14 ENCOUNTER — Other Ambulatory Visit: Payer: Self-pay

## 2014-07-14 DIAGNOSIS — Z1231 Encounter for screening mammogram for malignant neoplasm of breast: Secondary | ICD-10-CM

## 2014-07-26 ENCOUNTER — Ambulatory Visit
Admission: RE | Admit: 2014-07-26 | Discharge: 2014-07-26 | Disposition: A | Discharge status: Home / Self Care | Payer: Medicaid Other | Source: Ambulatory Visit

## 2014-07-26 DIAGNOSIS — Z1231 Encounter for screening mammogram for malignant neoplasm of breast: Secondary | ICD-10-CM

## 2014-08-03 ENCOUNTER — Other Ambulatory Visit: Payer: Self-pay | Admitting: Family

## 2014-08-03 DIAGNOSIS — R928 Other abnormal and inconclusive findings on diagnostic imaging of breast: Secondary | ICD-10-CM

## 2014-08-22 ENCOUNTER — Other Ambulatory Visit: Payer: Self-pay | Admitting: Family

## 2014-08-22 ENCOUNTER — Ambulatory Visit
Admission: RE | Admit: 2014-08-22 | Discharge: 2014-08-22 | Disposition: A | Discharge status: Home / Self Care | Payer: Medicaid Other | Source: Ambulatory Visit | Attending: Family | Admitting: Family

## 2014-08-22 DIAGNOSIS — R928 Other abnormal and inconclusive findings on diagnostic imaging of breast: Secondary | ICD-10-CM

## 2014-08-24 ENCOUNTER — Encounter: Payer: Self-pay | Admitting: *Deleted

## 2014-08-24 ENCOUNTER — Encounter: Payer: Self-pay | Admitting: Neurology

## 2014-08-24 ENCOUNTER — Ambulatory Visit (INDEPENDENT_AMBULATORY_CARE_PROVIDER_SITE_OTHER): Payer: Medicaid Other | Admitting: Neurology

## 2014-08-24 VITALS — BP 117/87 | HR 80 | Ht 68.5 in | Wt 181.6 lb

## 2014-08-24 DIAGNOSIS — I639 Cerebral infarction, unspecified: Secondary | ICD-10-CM

## 2014-08-24 DIAGNOSIS — I6381 Other cerebral infarction due to occlusion or stenosis of small artery: Secondary | ICD-10-CM

## 2014-08-24 NOTE — Progress Notes (Signed)
[facility] [address] [place]. [place] [address] [phone]       OFFICE FOLLOW-UP NOTE  Brittany Crawford Date of Birth:  10/10/1956 Medical Record Number:  [id number]   HPI: 28 year Caucasian lady who woke on  morning  of, 07/03/2014, time unknown) and noted she had a funny feeling in her left foot, leg, arm and face. She felt as it was decreased. She denies any other symptoms such as weakness, blurred or double vision, dysarthria or dysphagia.  . Patient was not administered TPA secondary to delay in arrival. She was admitted for further evaluation and treatment.MRI scan of the brain which I personally reviewed shows an acute right thalamic/internal capsule infarct and MRA of the brain showed no large vessel stenosis. Transthoracic echo showed normal ejection fraction without cardiac source of embolism. Carotid Dopplers showed no significant extracranial stenosis. Vascular risk factors identified include hypertension, hyperlipidemia, smoking and marijuana use. Patient was started on Plavix for secondary stroke prevention and advise aggressive blood pressure and cholesterol control and consult to quit smoking and marijuana. She states that she has tried quitting smoking but unsuccessfully as her husband still smokes. She has noted improvement in her paresthesias but still has some residual paresthesias involving the left cheek as well as fingertips in the left hand. She is annoyed by this sensation but is learning to live with it. She has noted improvement in her headache and feels her blood pressure is much better controlled. She has no other new complaints    ROS:   14 system review of systems is positive for rash, itching, loss of vision, eye pain, joint pain, headache, weakness, slurred speech, tremor PMH:  Past Medical History  Diagnosis Date  . H/O sarcoidosis   . Hypertension   . Seasonal allergies   . Chronic back pain     Social History:  History    Social History  . Marital Status: Married    Spouse Name: N/A    Number of Children: 2  . Years of Education: 12   Occupational History  . Not on file.   Social History Main Topics  . Smoking status: Current Every Day Smoker -- 2.00 packs/day for 0 years    Types: Cigarettes  . Smokeless tobacco: Never Used     Comment: 1-2 cigarettes daily for years  . Alcohol Use: 1.2 oz/week    2 Cans of beer per week  . Drug Use: No  . Sexual Activity: No   Other Topics Concern  . Not on file   Social History Narrative   Patient is married with 2 children.   Patient is right handed.   Patient has hs education.   Patient drinks 4 cups daily.    Medications:   Current Outpatient Prescriptions on File Prior to Visit  Medication Sig Dispense Refill  . amLODipine (NORVASC) 5 MG tablet Take 1 tablet (5 mg total) by mouth daily. 30 tablet 2  . aspirin 325 MG tablet Take 1 tablet (325 mg total) by mouth daily. 30 tablet 3  . atorvastatin (LIPITOR) 10 MG tablet Take 1 tablet (10 mg total) by mouth daily at 6 PM. 30 tablet 2  . cetirizine (ZYRTEC) 10 MG tablet Take 10 mg by mouth daily as needed for allergies.    . fluticasone (FLONASE) 50 MCG/ACT nasal spray Place 2 sprays into both nostrils daily as needed for allergies or rhinitis.    . Hypromellose (ARTIFICIAL TEARS OP) Place 1 drop into the  right eye daily as needed (for dry eyes).    . meloxicam (MOBIC) 15 MG tablet Take 15 mg by mouth daily as needed for pain.     . Multiple Vitamins-Minerals (WOMENS ONE DAILY PO) Take 1 tablet by mouth daily.    . prednisoLONE acetate (PRED FORTE) 1 % ophthalmic suspension Place 1 drop into the left eye as needed (flare ups).     Marland Kitchen telmisartan-hydrochlorothiazide (MICARDIS HCT) 40-12.5 MG per tablet Take 1 tablet by mouth daily.    Marland Kitchen triamcinolone cream (KENALOG) 0.1 % Apply 1 application topically at bedtime as needed (for rash). 30 g 0   No current facility-administered medications on file prior to  visit.    Allergies:  No Known Allergies  Physical Exam General: frail middle-aged Caucasian lady, seated, in no evident distress Head: head normocephalic and atraumatic.  Neck: supple with no carotid or supraclavicular bruits Cardiovascular: regular rate and rhythm, no murmurs Musculoskeletal: no deformity Skin:  no rash/petichiae Vascular:  Normal pulses all extremities Filed Vitals:   08/24/14 1441  BP: 117/87  Pulse: 80   Neurologic Exam Mental Status: Awake and fully alert. Oriented to place and time. Recent and remote memory intact. Attention span, concentration and fund of knowledge appropriate. Mood and affect appropriate.  Cranial Nerves: Fundoscopic exam reveals sharp disc margins. Pupils equal, briskly reactive to light. Extraocular movements full without nystagmus. Visual fields full to confrontation. Hearing intact. Facial sensation intact. Face, tongue, palate moves normally and symmetrically.  Motor: Normal bulk and tone. Normal strength in all tested extremity muscles. Sensory.: intact to touch ,pinprick .position and vibratory sensation. Mild subjective diminished sensation on the left cheeks and fingertips only Coordination: Rapid alternating movements normal in all extremities. Finger-to-nose and heel-to-shin performed accurately bilaterally. Gait and Station: Arises from chair without difficulty. Stance is normal. Gait demonstrates normal stride length and balance . Able to heel, toe and tandem walk without difficulty.  Reflexes: 1+ and symmetric. Toes downgoing.   NIHSS  1 Modified Rankin  1   ASSESSMENT: 58 year old Caucasian lady with right lateral thalamic/internal capsule lacunar infarct secondary to small vessel disease in September 2015 with vascular risk factors of hypertension, hyperlipidemia, cigarette smoking and marijuana use.She has done well but has mild residual paresthesias    PLAN: I had a long discussion with the patient with regards to her  recent stroke, personally reviewed imaging studies and hospital stroke workup , discussed risk of recurrent stroke and answered questions. I strongly encouraged her to stay on Plavix for stroke prevention and maintenance for control of hypertension with blood pressure goal below 130/90 and lipids with LDL cholesterol goal below 70 mg percent. I also encouraged her to quit smoking cigarettes and marijuana completely. She was advised to return for follow-up in 3 months or call earlier if necessary   Note: This document was prepared with digital dictation and possible smart phrase technology. Any transcriptional errors that result from this process are unintentional

## 2014-08-24 NOTE — Patient Instructions (Addendum)
I had a long discussion with the patient with regards to her recent stroke, personally reviewed imaging studies and hospital stroke workup , discussed risk of recurrent stroke and answered questions. I strongly encouraged her to stay on Plavix for stroke prevention and maintenance for control of hypertension with blood pressure goal below 130/90 and lipids with LDL cholesterol goal below 70 mg percent. I also encouraged her to quit smoking cigarettes and marijuana completely. She was advised to return for follow-up in 3 months or call earlier if necessary Stroke Prevention Some medical conditions and behaviors are associated with an increased chance of having a stroke. You may prevent a stroke by making healthy choices and managing medical conditions. HOW CAN I REDUCE MY RISK OF HAVING A STROKE?   Stay physically active. Get at least 30 minutes of activity on most or all days.  Do not smoke. It may also be helpful to avoid exposure to secondhand smoke.  Limit alcohol use. Moderate alcohol use is considered to be:  No more than 2 drinks per day for men.  No more than 1 drink per day for nonpregnant women.  Eat healthy foods. This involves:  Eating 5 or more servings of fruits and vegetables a day.  Making dietary changes that address high blood pressure (hypertension), high cholesterol, diabetes, or obesity.  Manage your cholesterol levels.  Making food choices that are high in fiber and low in saturated fat, trans fat, and cholesterol may control cholesterol levels.  Take any prescribed medicines to control cholesterol as directed by your health care provider.  Manage your diabetes.  Controlling your carbohydrate and sugar intake is recommended to manage diabetes.  Take any prescribed medicines to control diabetes as directed by your health care provider.  Control your hypertension.  Making food choices that are low in salt (sodium), saturated fat, trans fat, and cholesterol is  recommended to manage hypertension.  Take any prescribed medicines to control hypertension as directed by your health care provider.  Maintain a healthy weight.  Reducing calorie intake and making food choices that are low in sodium, saturated fat, trans fat, and cholesterol are recommended to manage weight.  Stop drug abuse.  Avoid taking birth control pills.  Talk to your health care provider about the risks of taking birth control pills if you are over 36 years old, smoke, get migraines, or have ever had a blood clot.  Get evaluated for sleep disorders (sleep apnea).  Talk to your health care provider about getting a sleep evaluation if you snore a lot or have excessive sleepiness.  Take medicines only as directed by your health care provider.  For some people, aspirin or blood thinners (anticoagulants) are helpful in reducing the risk of forming abnormal blood clots that can lead to stroke. If you have the irregular heart rhythm of atrial fibrillation, you should be on a blood thinner unless there is a good reason you cannot take them.  Understand all your medicine instructions.  Make sure that other conditions (such as anemia or atherosclerosis) are addressed. SEEK IMMEDIATE MEDICAL CARE IF:   You have sudden weakness or numbness of the face, arm, or leg, especially on one side of the body.  Your face or eyelid droops to one side.  You have sudden confusion.  You have trouble speaking (aphasia) or understanding.  You have sudden trouble seeing in one or both eyes.  You have sudden trouble walking.  You have dizziness.  You have a loss of balance or  coordination.  You have a sudden, severe headache with no known cause.  You have new chest pain or an irregular heartbeat. Any of these symptoms may represent a serious problem that is an emergency. Do not wait to see if the symptoms will go away. Get medical help at once. Call your local emergency services (911 in [place]  Do not drive yourself to the hospital. Document Released: 11/06/2004 Document Revised: 02/13/2014 Document Reviewed: 04/01/2013 [facility] Patient Information 2015 [place], [place]. This information is not intended to replace advice given to you by your health care provider. Make sure you discuss any questions you have with your health care provider.

## 2014-10-02 ENCOUNTER — Ambulatory Visit: Payer: Medicaid Other | Admitting: Neurology

## 2014-11-28 ENCOUNTER — Ambulatory Visit: Payer: Medicaid Other | Admitting: Neurology

## 2014-11-28 ENCOUNTER — Encounter: Payer: Self-pay | Admitting: Physical Medicine & Rehabilitation

## 2014-12-04 ENCOUNTER — Encounter: Payer: Self-pay | Admitting: Physical Medicine & Rehabilitation

## 2014-12-04 ENCOUNTER — Encounter: Payer: Medicaid Other | Attending: Physical Medicine & Rehabilitation | Admitting: Physical Medicine & Rehabilitation

## 2014-12-04 ENCOUNTER — Other Ambulatory Visit: Payer: Self-pay | Admitting: Physical Medicine & Rehabilitation

## 2014-12-04 VITALS — BP 163/102 | HR 86 | Resp 14

## 2014-12-04 DIAGNOSIS — M545 Low back pain: Secondary | ICD-10-CM | POA: Diagnosis not present

## 2014-12-04 DIAGNOSIS — I639 Cerebral infarction, unspecified: Secondary | ICD-10-CM

## 2014-12-04 DIAGNOSIS — I69398 Other sequelae of cerebral infarction: Secondary | ICD-10-CM | POA: Insufficient documentation

## 2014-12-04 DIAGNOSIS — M25561 Pain in right knee: Secondary | ICD-10-CM | POA: Insufficient documentation

## 2014-12-04 DIAGNOSIS — F1210 Cannabis abuse, uncomplicated: Secondary | ICD-10-CM

## 2014-12-04 DIAGNOSIS — M5136 Other intervertebral disc degeneration, lumbar region: Secondary | ICD-10-CM | POA: Insufficient documentation

## 2014-12-04 DIAGNOSIS — I6381 Other cerebral infarction due to occlusion or stenosis of small artery: Secondary | ICD-10-CM

## 2014-12-04 DIAGNOSIS — H53462 Homonymous bilateral field defects, left side: Secondary | ICD-10-CM | POA: Diagnosis not present

## 2014-12-04 DIAGNOSIS — M129 Arthropathy, unspecified: Secondary | ICD-10-CM | POA: Insufficient documentation

## 2014-12-04 DIAGNOSIS — M479 Spondylosis, unspecified: Secondary | ICD-10-CM | POA: Insufficient documentation

## 2014-12-04 DIAGNOSIS — M47816 Spondylosis without myelopathy or radiculopathy, lumbar region: Secondary | ICD-10-CM | POA: Insufficient documentation

## 2014-12-04 NOTE — Patient Instructions (Addendum)
PLEASE CALL ME WITH ANY PROBLEMS OR QUESTIONS (#[phone]).     ONCE I HAVE CONFIRMATION THAT YOUR URINE SPECIMEN IS CONSISTENT WITH YOUR HISTORY AND PRESCRIBED MEDICATIONS, I WILL BE WILLING TO PRESCRIBE YOUR PAIN MEDICATION. THE RESULTS OF YOUR URINE TESTING COULD TAKE A WEEK OR MORE TO RETURN, HOWEVER.  IF WE DO NOT CONTACT YOU REGARDING THESE RESULTS WITHIN 10 DAYS, PLEASE CONTACT us.
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
## 2014-12-04 NOTE — Addendum Note (Signed)
Addended by: Caro Hight on: 12/04/2014 12:24 PM   Modules accepted: Orders

## 2014-12-05 LAB — PMP ALCOHOL METABOLITE (ETG): Ethyl Glucuronide (EtG): NEGATIVE ng/mL

## 2014-12-11 ENCOUNTER — Ambulatory Visit (INDEPENDENT_AMBULATORY_CARE_PROVIDER_SITE_OTHER): Payer: Medicaid Other | Admitting: Neurology

## 2014-12-11 ENCOUNTER — Encounter: Payer: Self-pay | Admitting: Neurology

## 2014-12-11 VITALS — BP 137/90 | HR 99 | Ht 68.5 in | Wt 184.4 lb

## 2014-12-11 DIAGNOSIS — R202 Paresthesia of skin: Secondary | ICD-10-CM | POA: Diagnosis not present

## 2014-12-11 LAB — OPIATES/OPIOIDS (LC/MS-MS)
Codeine Urine: NEGATIVE ng/mL (ref <50)
HYDROCODONE: 52 ng/mL (ref <50)
Hydromorphone: NEGATIVE ng/mL — AB (ref <50)
Morphine Urine: NEGATIVE ng/mL (ref <50)
NOROXYCODONE, UR: NEGATIVE ng/mL (ref <50)
Norhydrocodone, Ur: 61 ng/mL (ref <50)
OXYMORPHONE, URINE: NEGATIVE ng/mL (ref <50)
Oxycodone, ur: NEGATIVE ng/mL (ref <50)

## 2014-12-11 NOTE — Progress Notes (Signed)
[facility] [address] [place]. [place] [address] [phone]       OFFICE FOLLOW-UP NOTE  [name] Date of Birth:  04-03-1956 Medical Record Number:  [id number]   HPI: 59 year Caucasian lady who woke on  morning  of, 07/03/2014, time unknown) and noted she had a funny feeling in her left foot, leg, arm and face. She felt as it was decreased. She denies any other symptoms such as weakness, blurred or double vision, dysarthria or dysphagia.  . Patient was not administered TPA secondary to delay in arrival. She was admitted for further evaluation and treatment.MRI scan of the brain which I personally reviewed shows an acute right thalamic/internal capsule infarct and MRA of the brain showed no large vessel stenosis. Transthoracic echo showed normal ejection fraction without cardiac source of embolism. Carotid Dopplers showed no significant extracranial stenosis. Vascular risk factors identified include hypertension, hyperlipidemia, smoking and marijuana use. Patient was started on Plavix for secondary stroke prevention and advise aggressive blood pressure and cholesterol control and consult to quit smoking and marijuana. She states that she has tried quitting smoking but unsuccessfully as her husband still smokes. She has noted improvement in her paresthesias but still has some residual paresthesias involving the left cheek as well as fingertips in the left hand. She is annoyed by this sensation but is learning to live with it. She has noted improvement in her headache and feels her blood pressure is much better controlled. She has no other new complaints Update 12/11/2014 : She returns for follow-up after last visit 3 months ago. She continues to do well without recurrent stroke or TIA symptoms. She still has some intermittent tingling in the left hand as well as some left leg weakness but overall she feels she is walking a lot better. She has difficulty falling asleep.  She also has chronic back pain and she plans to see Dr. Tessa Lerner from rehabilitation soon for this. She is tolerating aspirin well without significant bleeding or bruising. She states her blood pressure and cholesterol are both have been under good control. She has no new neurological symptoms.   ROS:   14 system review of systems is positive for ear pain, eye itching, loss of vision, eye pain, insomnia, sleep talking, weakness and all other systems negative  PMH:  Past Medical History  Diagnosis Date  . H/O sarcoidosis   . Hypertension   . Seasonal allergies   . Chronic back pain     Social History:  History   Social History  . Marital Status: Married    Spouse Name: N/A  . Number of Children: 2  . Years of Education: 12   Occupational History  . Not on file.   Social History Main Topics  . Smoking status: Current Every Day Smoker -- 2.00 packs/day for 0 years    Types: Cigarettes  . Smokeless tobacco: Never Used     Comment: 1-2 cigarettes daily for years  . Alcohol Use: 1.2 oz/week    2 Cans of beer per week  . Drug Use: No  . Sexual Activity: No   Other Topics Concern  . Not on file   Social History Narrative   Patient is married with 2 children.   Patient is right handed.   Patient has hs education.   Patient drinks 4 cups daily.    Medications:   Current Outpatient Prescriptions on File Prior to Visit  Medication Sig Dispense Refill  . amLODipine (NORVASC)  5 MG tablet Take 1 tablet (5 mg total) by mouth daily. 30 tablet 2  . aspirin 325 MG tablet Take 1 tablet (325 mg total) by mouth daily. 30 tablet 3  . atorvastatin (LIPITOR) 10 MG tablet Take 1 tablet (10 mg total) by mouth daily at 6 PM. 30 tablet 2  . cetirizine (ZYRTEC) 10 MG tablet Take 10 mg by mouth daily as needed for allergies.    . cyclobenzaprine (FLEXERIL) 5 MG tablet Take 5 mg by mouth 3 (three) times daily as needed for muscle spasms.    . diazepam (VALIUM) 5 MG tablet Take 5 mg by mouth  every 6 (six) hours as needed for anxiety.    . fluticasone (FLONASE) 50 MCG/ACT nasal spray Place 2 sprays into both nostrils daily as needed for allergies or rhinitis.    Marland Kitchen gabapentin (NEURONTIN) 100 MG capsule Take 100 mg by mouth at bedtime.    Marland Kitchen HYDROcodone-acetaminophen (NORCO/VICODIN) 5-325 MG per tablet   0  . Hypromellose (ARTIFICIAL TEARS OP) Place 1 drop into the right eye daily as needed (for dry eyes).    . meloxicam (MOBIC) 15 MG tablet Take 15 mg by mouth daily as needed for pain.     . Multiple Vitamins-Minerals (WOMENS ONE DAILY PO) Take 1 tablet by mouth daily.    . prednisoLONE acetate (PRED FORTE) 1 % ophthalmic suspension Place 1 drop into the left eye as needed (flare ups).     . traZODone (DESYREL) 50 MG tablet Take 50 mg by mouth at bedtime.    . triamcinolone cream (KENALOG) 0.1 % Apply 1 application topically at bedtime as needed (for rash). 30 g 0   No current facility-administered medications on file prior to visit.    Allergies:  No Known Allergies  Physical Exam General: frail middle-aged Caucasian lady, seated, in no evident distress Head: head normocephalic and atraumatic.  Neck: supple with no carotid or supraclavicular bruits Cardiovascular: regular rate and rhythm, no murmurs Musculoskeletal: no deformity Skin:  no rash/petichiae Vascular:  Normal pulses all extremities Filed Vitals:   12/11/14 1405  BP: 137/90  Pulse: 99   Neurologic Exam Mental Status: Awake and fully alert. Oriented to place and time. Recent and remote memory intact. Attention span, concentration and fund of knowledge appropriate. Mood and affect appropriate.  Cranial Nerves: Fundoscopic exam reveals sharp disc margins. Pupils equal, briskly reactive to light. Extraocular movements full without nystagmus. Visual fields full to confrontation. Hearing intact. Facial sensation intact. Face, tongue, palate moves normally and symmetrically.  Motor: Normal bulk and tone. Normal strength  in all tested extremity muscles. Sensory.: intact to touch ,pinprick .position and vibratory sensation. Mild subjective diminished sensation on the left cheeks and fingertips only Coordination: Rapid alternating movements normal in all extremities. Finger-to-nose and heel-to-shin performed accurately bilaterally. Gait and Station: Arises from chair without difficulty. Stance is normal. Gait demonstrates normal stride length and balance . Able to heel, toe and tandem walk without difficulty.  Reflexes: 1+ and symmetric. Toes downgoing.   NIHSS  1 Modified Rankin  1   ASSESSMENT: 59 year old Caucasian lady with right lateral thalamic/internal capsule lacunar infarct secondary to small vessel disease in September 2015 with vascular risk factors of hypertension, hyperlipidemia, cigarette smoking and marijuana use.She has done well but has mild residual paresthesias    PLAN:  I had a long d/w patient about his recent stroke, risk for recurrent stroke/TIAs, personally independently reviewed imaging studies and stroke evaluation results and answered questions.Continue aspirin 325  mg orally every day  for secondary stroke prevention and maintain strict control of hypertension with blood pressure goal below 130/90, diabetes with hemoglobin A1c goal below 6.5% and lipids with LDL cholesterol goal below 100 mg/dL. I also advised the patient to eat a healthy diet with plenty of whole grains, cereals, fruits and vegetables, exercise regularly and maintain ideal body weight Followup in the future with me in  6 months.   Note: This document was prepared with digital dictation and possible smart phrase technology. Any transcriptional errors that result from this process are unintentional

## 2014-12-11 NOTE — Patient Instructions (Signed)
I had a long d/w patient about his recent stroke, risk for recurrent stroke/TIAs, personally independently reviewed imaging studies and stroke evaluation results and answered questions.Continue aspirin 325 mg orally every day  for secondary stroke prevention and maintain strict control of hypertension with blood pressure goal below 130/90, diabetes with hemoglobin A1c goal below 6.5% and lipids with LDL cholesterol goal below 100 mg/dL. I also advised the patient to eat a healthy diet with plenty of whole grains, cereals, fruits and vegetables, exercise regularly and maintain ideal body weight Followup in the future with me in  6 months.

## 2014-12-12 LAB — PRESCRIPTION MONITORING PROFILE (SOLSTAS)
AMPHETAMINE/METH: NEGATIVE ng/mL
BARBITURATE SCREEN, URINE: NEGATIVE ng/mL
Benzodiazepine Screen, Urine: NEGATIVE ng/mL
Buprenorphine, Urine: NEGATIVE ng/mL
CARISOPRODOL, URINE: NEGATIVE ng/mL
COCAINE METABOLITES: NEGATIVE ng/mL
Cannabinoid Scrn, Ur: NEGATIVE ng/mL
Creatinine, Urine: 28.01 mg/dL (ref >20.0)
Fentanyl, Ur: NEGATIVE ng/mL
MDMA URINE: NEGATIVE ng/mL
Meperidine, Ur: NEGATIVE ng/mL
Methadone Screen, Urine: NEGATIVE ng/mL
Nitrites, Initial: NEGATIVE ug/mL
Oxycodone Screen, Ur: NEGATIVE ng/mL
PH URINE, INITIAL: 5.7 pH (ref 4.5–8.9)
Propoxyphene: NEGATIVE ng/mL
TAPENTADOLUR: NEGATIVE ng/mL
TRAMADOL UR: NEGATIVE ng/mL
Zolpidem, Urine: NEGATIVE ng/mL

## 2014-12-22 ENCOUNTER — Emergency Department (HOSPITAL_COMMUNITY): Payer: Medicaid Other

## 2014-12-22 ENCOUNTER — Encounter (HOSPITAL_COMMUNITY): Payer: Self-pay | Admitting: Emergency Medicine

## 2014-12-22 ENCOUNTER — Emergency Department (HOSPITAL_COMMUNITY)
Admission: EM | Admit: 2014-12-22 | Discharge: 2014-12-22 | Disposition: A | Discharge status: Home / Self Care | Payer: Medicaid Other | Attending: Emergency Medicine | Admitting: Emergency Medicine

## 2014-12-22 DIAGNOSIS — G8929 Other chronic pain: Secondary | ICD-10-CM | POA: Insufficient documentation

## 2014-12-22 DIAGNOSIS — M25569 Pain in unspecified knee: Secondary | ICD-10-CM

## 2014-12-22 DIAGNOSIS — Z8709 Personal history of other diseases of the respiratory system: Secondary | ICD-10-CM | POA: Insufficient documentation

## 2014-12-22 DIAGNOSIS — Z79899 Other long term (current) drug therapy: Secondary | ICD-10-CM | POA: Insufficient documentation

## 2014-12-22 DIAGNOSIS — Z791 Long term (current) use of non-steroidal anti-inflammatories (NSAID): Secondary | ICD-10-CM | POA: Insufficient documentation

## 2014-12-22 DIAGNOSIS — Z7982 Long term (current) use of aspirin: Secondary | ICD-10-CM | POA: Insufficient documentation

## 2014-12-22 DIAGNOSIS — M25561 Pain in right knee: Secondary | ICD-10-CM | POA: Diagnosis present

## 2014-12-22 DIAGNOSIS — Z720 Tobacco use: Secondary | ICD-10-CM | POA: Diagnosis not present

## 2014-12-22 DIAGNOSIS — I10 Essential (primary) hypertension: Secondary | ICD-10-CM | POA: Diagnosis not present

## 2014-12-22 DIAGNOSIS — Z7951 Long term (current) use of inhaled steroids: Secondary | ICD-10-CM | POA: Insufficient documentation

## 2014-12-22 DIAGNOSIS — Z8673 Personal history of transient ischemic attack (TIA), and cerebral infarction without residual deficits: Secondary | ICD-10-CM | POA: Diagnosis not present

## 2014-12-22 HISTORY — DX: Cerebral infarction, unspecified: I63.9

## 2014-12-22 MED ORDER — HYDROCODONE-ACETAMINOPHEN 5-325 MG PO TABS: 1.0000 | ORAL_TABLET | Freq: Four times a day (QID) | ORAL | Status: DC | PRN

## 2014-12-22 MED ORDER — HYDROCODONE-ACETAMINOPHEN 5-325 MG PO TABS
2.0000 | ORAL_TABLET | Freq: Once | ORAL | Status: AC
  Administered 2014-12-22: 2 via ORAL
  Filled 2014-12-22: qty 2

## 2014-12-22 NOTE — ED Provider Notes (Signed)
CSN: [id number]     Arrival date & time 12/22/14  0749 History   First MD Initiated Contact with Patient 12/22/14 0754     Chief Complaint  Patient presents with  . Knee Pain     (Consider location/radiation/quality/duration/timing/severity/associated sxs/prior Treatment) HPI  This a 59 yo with a history of sarcoidosis, hypertension, chronic back pain, CVA who presents with right knee pain. Patient reports a 2 month history of worsening right knee pain. It is worse with ambulation area the patient states that she's having difficulty sleeping at night. She describes the pain as achy and nonradiating. Currently her pain is 7 out of 10. She has tried meloxicam but that does not seem to help. Warmth on the knee does help. She denies any fever or skin changes over the knee. She denies any new injury. Patient also reports recent history of difficulty controlling her blood pressure. She is seeing her primary doctor and is awaiting approval for a new blood pressure medication. She is wondering whether her blood pressure is high because of the pain.  She states that her primary physician recommended getting an x-ray of her knee which she has not done. She denies any headache, shortness of breath, chest pain. She denies any weakness, numbness, or tingling of the lower extremity.  Past Medical History  Diagnosis Date  . H/O sarcoidosis   . Hypertension   . Seasonal allergies   . Chronic back pain   . Stroke    Past Surgical History  Procedure Laterality Date  . Foot surgery      left-pins placed  . Lymph node biopsy    . Dilatation & currettage/hysteroscopy with resectocope N/A 12/10/2012    Procedure: DILATATION & CURETTAGE/HYSTEROSCOPY WITH RESECTOCOPE;  Surgeon: Marvene Staff, MD;  Location: [facility];  Service: Gynecology;  Laterality: N/A;  . Polypectomy N/A 12/10/2012    Procedure: POLYPECTOMY;  Surgeon: Marvene Staff, MD;  Location: [facility];  Service: Gynecology;  Laterality: N/A;    Family History  Problem Relation Age of Onset  . Colon cancer Maternal Uncle   . Stroke Mother    History  Substance Use Topics  . Smoking status: Current Every Day Smoker -- 0.50 packs/day for 0 years    Types: Cigarettes  . Smokeless tobacco: Never Used     Comment: 1-2 cigarettes daily for years  . Alcohol Use: 1.2 oz/week    2 Cans of beer per week   OB History    No data available     Review of Systems  Constitutional: Negative for fever.  Respiratory: Negative for chest tightness and shortness of breath.   Cardiovascular: Negative for chest pain.  Gastrointestinal: Negative for abdominal pain.  Musculoskeletal: Positive for back pain.       Right knee pain  Skin: Negative for color change and wound.  Neurological: Negative for headaches.  Psychiatric/Behavioral: Negative for confusion.  All other systems reviewed and are negative.     Allergies  Review of patient's allergies indicates no known allergies.  Home Medications   Prior to Admission medications   Medication Sig Start Date End Date Taking? Authorizing Provider  amLODipine (NORVASC) 5 MG tablet Take 1 tablet (5 mg total) by mouth daily. 12/12/13   Harden Mo, MD  aspirin 325 MG tablet Take 1 tablet (325 mg total) by mouth daily. 07/07/14   Bonnielee Haff, MD  atorvastatin (LIPITOR) 10 MG tablet Take 1 tablet (10 mg total) by mouth daily at 6 PM.  07/07/14   Bonnielee Haff, MD  cetirizine (ZYRTEC) 10 MG tablet Take 10 mg by mouth daily as needed for allergies.    Historical Provider, MD  cyclobenzaprine (FLEXERIL) 5 MG tablet Take 5 mg by mouth 3 (three) times daily as needed for muscle spasms.    Historical Provider, MD  diazepam (VALIUM) 5 MG tablet Take 5 mg by mouth every 6 (six) hours as needed for anxiety.    Historical Provider, MD  fluticasone (FLONASE) 50 MCG/ACT nasal spray Place 2 sprays into both nostrils daily as needed for allergies or rhinitis.    Historical Provider, MD  gabapentin  (NEURONTIN) 100 MG capsule Take 100 mg by mouth at bedtime.    Historical Provider, MD  HYDROcodone-acetaminophen (NORCO/VICODIN) 5-325 MG per tablet  08/19/14   Historical Provider, MD  HYDROcodone-acetaminophen (NORCO/VICODIN) 5-325 MG per tablet Take 1-2 tablets by mouth every 6 (six) hours as needed for moderate pain. 12/22/14   Merryl Hacker, MD  Hypromellose (ARTIFICIAL TEARS OP) Place 1 drop into the right eye daily as needed (for dry eyes).    Historical Provider, MD  meloxicam (MOBIC) 15 MG tablet Take 15 mg by mouth daily as needed for pain.     Historical Provider, MD  Multiple Vitamins-Minerals (WOMENS ONE DAILY PO) Take 1 tablet by mouth daily.    Historical Provider, MD  prednisoLONE acetate (PRED FORTE) 1 % ophthalmic suspension Place 1 drop into the left eye as needed (flare ups).     Historical Provider, MD  traZODone (DESYREL) 50 MG tablet Take 50 mg by mouth at bedtime.    Historical Provider, MD  triamcinolone cream (KENALOG) 0.1 % Apply 1 application topically at bedtime as needed (for rash). 07/08/14   Bonnielee Haff, MD   BP 156/101 mmHg  Pulse 85  Temp(Src) 98.5 F (36.9 C) (Oral)  Resp 18  SpO2 99%  LMP 11/13/2012 Physical Exam  Constitutional: She is oriented to person, place, and time. She appears well-developed and well-nourished. No distress.  HENT:  Head: Normocephalic and atraumatic.  Cardiovascular: Normal rate, regular rhythm and normal heart sounds.   Pulmonary/Chest: Effort normal. No respiratory distress.  Abdominal: Soft. There is no tenderness.  Musculoskeletal:  Normal range of motion of the right knee, crepitus noted, no significant effusion  Neurological: She is alert and oriented to person, place, and time.  Skin: Skin is warm and dry.  Psychiatric: She has a normal mood and affect.  Nursing note and vitals reviewed.   ED Course  Procedures (including critical care time) Labs Review Labs Reviewed - No data to display  Imaging Review Dg  Knee Complete 4 Views Right  12/22/2014   CLINICAL DATA:  Right knee pain, no known injury, initial encounter  EXAM: RIGHT KNEE - COMPLETE 4+ VIEW  COMPARISON:  None.  FINDINGS: There is no evidence of fracture, dislocation, or joint effusion. There is no evidence of arthropathy or other focal bone abnormality. Soft tissues are unremarkable.  IMPRESSION: No acute abnormality noted.   Electronically Signed   By: Inez Catalina M.D.   On: 12/22/2014 08:33     EKG Interpretation None      MDM   Final diagnoses:  Knee pain  Essential hypertension    Patient presents with knee pain. Reports that it is chronic in nature and worse at night and with movement. No signs or symptoms of infection and low suspicion at this time for septic arthritis. Plain films negative. Suspect chronic ligamentous injury versus arthritis.  Patient reports that she is going back to pain management. Review of the data base shows no recent narcotic prescriptions. Discussed with patient that she would need to see her PCP and/or her pain management doctor for further narcotic prescriptions regarding her pain. Patient stated understanding. Given that she has not had any recent narcotic prescriptions and does not have a appointment with pain management for several weeks, patient will be given a short course of pain medication. She was also encouraged to use ibuprofen and heat when necessary. Regarding patient's hypertension, patient reports that she's followed closely by her primary physician and is working to get a new medication approved by insurance. No further workup warranted at this time; however, patient was given return precautions if she develops chest pain, shortness breath, or headache.  After history, exam, and medical workup I feel the patient has been appropriately medically screened and is safe for discharge home. Pertinent diagnoses were discussed with the patient. Patient was given return precautions.    Merryl Hacker, MD 12/22/14 [phone]

## 2014-12-22 NOTE — ED Notes (Signed)
Patient states she is having knee pain secondary to "calcium deposits in knee".  Patient states she now has "knots all over her R knee for years".   Patient states has residual problems with her balance since stroke 4 months ago, but states her mobility is affected by her R knee pain.   Patient states her blood pressure is also up.

## 2014-12-22 NOTE — Discharge Instructions (Signed)
You were seen today for 2 months of knee pain. X-rays are negative. There are no signs or symptoms of infection or blood clot. You would be given a short course of pain medication; however, you should see her primary doctor or pain management for further pain medications. You also noted to be hypertensive. You should follow-up with her primary doctor regarding further blood pressure medications. See return precautions below.  Knee Pain The knee is the complex joint between your thigh and your lower leg. It is made up of bones, tendons, ligaments, and cartilage. The bones that make up the knee are:  The femur in the thigh.  The tibia and fibula in the lower leg.  The patella or kneecap riding in the groove on the lower femur. CAUSES  Knee pain is a common complaint with many causes. A few of these causes are:  Injury, such as:  A ruptured ligament or tendon injury.  Torn cartilage.  Medical conditions, such as:  Gout  Arthritis  Infections  Overuse, over training, or overdoing a physical activity. Knee pain can be minor or severe. Knee pain can accompany debilitating injury. Minor knee problems often respond well to self-care measures or get well on their own. More serious injuries may need medical intervention or even surgery. SYMPTOMS The knee is complex. Symptoms of knee problems can vary widely. Some of the problems are:  Pain with movement and weight bearing.  Swelling and tenderness.  Buckling of the knee.  Inability to straighten or extend your knee.  Your knee locks and you cannot straighten it.  Warmth and redness with pain and fever.  Deformity or dislocation of the kneecap. DIAGNOSIS  Determining what is wrong may be very straight forward such as when there is an injury. It can also be challenging because of the complexity of the knee. Tests to make a diagnosis may include:  Your caregiver taking a history and doing a physical exam.  Routine X-rays can be  used to rule out other problems. X-rays will not reveal a cartilage tear. Some injuries of the knee can be diagnosed by:  Arthroscopy a surgical technique by which a small video camera is inserted through tiny incisions on the sides of the knee. This procedure is used to examine and repair internal knee joint problems. Tiny instruments can be used during arthroscopy to repair the torn knee cartilage (meniscus).  Arthrography is a radiology technique. A contrast liquid is directly injected into the knee joint. Internal structures of the knee joint then become visible on X-ray film.  An MRI scan is a non X-ray radiology procedure in which magnetic fields and a computer produce two- or three-dimensional images of the inside of the knee. Cartilage tears are often visible using an MRI scanner. MRI scans have largely replaced arthrography in diagnosing cartilage tears of the knee.  Blood work.  Examination of the fluid that helps to lubricate the knee joint (synovial fluid). This is done by taking a sample out using a needle and a syringe. TREATMENT The treatment of knee problems depends on the cause. Some of these treatments are:  Depending on the injury, proper casting, splinting, surgery, or physical therapy care will be needed.  Give yourself adequate recovery time. Do not overuse your joints. If you begin to get sore during workout routines, back off. Slow down or do fewer repetitions.  For repetitive activities such as cycling or running, maintain your strength and nutrition.  Alternate muscle groups. For example, if you  are a weight lifter, work the upper body on one day and the lower body the next.  Either tight or weak muscles do not give the proper support for your knee. Tight or weak muscles do not absorb the stress placed on the knee joint. Keep the muscles surrounding the knee strong.  Take care of mechanical problems.  If you have flat feet, orthotics or special shoes may help. See  your caregiver if you need help.  Arch supports, sometimes with wedges on the inner or outer aspect of the heel, can help. These can shift pressure away from the side of the knee most bothered by osteoarthritis.  A brace called an "unloader" brace also may be used to help ease the pressure on the most arthritic side of the knee.  If your caregiver has prescribed crutches, braces, wraps or ice, use as directed. The acronym for this is PRICE. This means protection, rest, ice, compression, and elevation.  Nonsteroidal anti-inflammatory drugs (NSAIDs), can help relieve pain. But if taken immediately after an injury, they may actually increase swelling. Take NSAIDs with food in your stomach. Stop them if you develop stomach problems. Do not take these if you have a history of ulcers, stomach pain, or bleeding from the bowel. Do not take without your caregiver's approval if you have problems with fluid retention, heart failure, or kidney problems.  For ongoing knee problems, physical therapy may be helpful.  Glucosamine and chondroitin are over-the-counter dietary supplements. Both may help relieve the pain of osteoarthritis in the knee. These medicines are different from the usual anti-inflammatory drugs. Glucosamine may decrease the rate of cartilage destruction.  Injections of a corticosteroid drug into your knee joint may help reduce the symptoms of an arthritis flare-up. They may provide pain relief that lasts a few months. You may have to wait a few months between injections. The injections do have a small increased risk of infection, water retention, and elevated blood sugar levels.  Hyaluronic acid injected into damaged joints may ease pain and provide lubrication. These injections may work by reducing inflammation. A series of shots may give relief for as long as 6 months.  Topical painkillers. Applying certain ointments to your skin may help relieve the pain and stiffness of osteoarthritis. Ask  your pharmacist for suggestions. Many over the-counter products are approved for temporary relief of arthritis pain.  In some countries, doctors often prescribe topical NSAIDs for relief of chronic conditions such as arthritis and tendinitis. A review of treatment with NSAID creams found that they worked as well as oral medications but without the serious side effects. PREVENTION  Maintain a healthy weight. Extra pounds put more strain on your joints.  Get strong, stay limber. Weak muscles are a common cause of knee injuries. Stretching is important. Include flexibility exercises in your workouts.  Be smart about exercise. If you have osteoarthritis, chronic knee pain or recurring injuries, you may need to change the way you exercise. This does not mean you have to stop being active. If your knees ache after jogging or playing basketball, consider switching to swimming, water aerobics, or other low-impact activities, at least for a few days a week. Sometimes limiting high-impact activities will provide relief.  Make sure your shoes fit well. Choose footwear that is right for your sport.  Protect your knees. Use the proper gear for knee-sensitive activities. Use kneepads when playing volleyball or laying carpet. Buckle your seat belt every time you drive. Most shattered kneecaps occur in car  accidents.  Rest when you are tired. SEEK MEDICAL CARE IF:  You have knee pain that is continual and does not seem to be getting better.  SEEK IMMEDIATE MEDICAL CARE IF:  Your knee joint feels hot to the touch and you have a high fever. MAKE SURE YOU:   Understand these instructions.  Will watch your condition.  Will get help right away if you are not doing well or get worse. Document Released: 07/27/2007 Document Revised: 12/22/2011 Document Reviewed: 07/27/2007 [facility] Patient Information 2015 [place], [place]. This information is not intended to replace advice given to you by your health care  provider. Make sure you discuss any questions you have with your health care provider. Hypertension Hypertension, commonly called high blood pressure, is when the force of blood pumping through your arteries is too strong. Your arteries are the blood vessels that carry blood from your heart throughout your body. A blood pressure reading consists of a higher number over a lower number, such as 110/72. The higher number (systolic) is the pressure inside your arteries when your heart pumps. The lower number (diastolic) is the pressure inside your arteries when your heart relaxes. Ideally you want your blood pressure below 120/80. Hypertension forces your heart to work harder to pump blood. Your arteries may become narrow or stiff. Having hypertension puts you at risk for heart disease, stroke, and other problems.  RISK FACTORS Some risk factors for high blood pressure are controllable. Others are not.  Risk factors you cannot control include:   Race. You may be at higher risk if you are African American.  Age. Risk increases with age.  Gender. Men are at higher risk than women before age 80 years. After age 65, women are at higher risk than men. Risk factors you can control include:  Not getting enough exercise or physical activity.  Being overweight.  Getting too much fat, sugar, calories, or salt in your diet.  Drinking too much alcohol. SIGNS AND SYMPTOMS Hypertension does not usually cause signs or symptoms. Extremely high blood pressure (hypertensive crisis) may cause headache, anxiety, shortness of breath, and nosebleed. DIAGNOSIS  To check if you have hypertension, your health care provider will measure your blood pressure while you are seated, with your arm held at the level of your heart. It should be measured at least twice using the same arm. Certain conditions can cause a difference in blood pressure between your right and left arms. A blood pressure reading that is higher than  normal on one occasion does not mean that you need treatment. If one blood pressure reading is high, ask your health care provider about having it checked again. TREATMENT  Treating high blood pressure includes making lifestyle changes and possibly taking medicine. Living a healthy lifestyle can help lower high blood pressure. You may need to change some of your habits. Lifestyle changes may include:  Following the DASH diet. This diet is high in fruits, vegetables, and whole grains. It is low in salt, red meat, and added sugars.  Getting at least 2 hours of brisk physical activity every week.  Losing weight if necessary.  Not smoking.  Limiting alcoholic beverages.  Learning ways to reduce stress. If lifestyle changes are not enough to get your blood pressure under control, your health care provider may prescribe medicine. You may need to take more than one. Work closely with your health care provider to understand the risks and benefits. HOME CARE INSTRUCTIONS  Have your blood pressure rechecked as  directed by your health care provider.   Take medicines only as directed by your health care provider. Follow the directions carefully. Blood pressure medicines must be taken as prescribed. The medicine does not work as well when you skip doses. Skipping doses also puts you at risk for problems.   Do not smoke.   Monitor your blood pressure at home as directed by your health care provider. SEEK MEDICAL CARE IF:   You think you are having a reaction to medicines taken.  You have recurrent headaches or feel dizzy.  You have swelling in your ankles.  You have trouble with your vision. SEEK IMMEDIATE MEDICAL CARE IF:  You develop a severe headache or confusion.  You have unusual weakness, numbness, or feel faint.  You have severe chest or abdominal pain.  You vomit repeatedly.  You have trouble breathing. MAKE SURE YOU:   Understand these instructions.  Will watch your  condition.  Will get help right away if you are not doing well or get worse. Document Released: 09/29/2005 Document Revised: 02/13/2014 Document Reviewed: 07/22/2013 [facility] Patient Information 2015 [place], [place]. This information is not intended to replace advice given to you by your health care provider. Make sure you discuss any questions you have with your health care provider.

## 2014-12-27 NOTE — Progress Notes (Signed)
Urine drug screen for this encounter is consistent for prescribed medication 

## 2015-01-02 ENCOUNTER — Encounter: Payer: Self-pay | Admitting: Registered Nurse

## 2015-01-02 ENCOUNTER — Encounter: Payer: Medicaid Other | Attending: Physical Medicine & Rehabilitation | Admitting: Registered Nurse

## 2015-01-02 VITALS — BP 153/91 | HR 80 | Resp 14

## 2015-01-02 DIAGNOSIS — I69398 Other sequelae of cerebral infarction: Secondary | ICD-10-CM | POA: Diagnosis not present

## 2015-01-02 DIAGNOSIS — M129 Arthropathy, unspecified: Secondary | ICD-10-CM | POA: Insufficient documentation

## 2015-01-02 DIAGNOSIS — M5136 Other intervertebral disc degeneration, lumbar region: Secondary | ICD-10-CM | POA: Diagnosis not present

## 2015-01-02 DIAGNOSIS — Z79899 Other long term (current) drug therapy: Secondary | ICD-10-CM

## 2015-01-02 DIAGNOSIS — Z5181 Encounter for therapeutic drug level monitoring: Secondary | ICD-10-CM

## 2015-01-02 DIAGNOSIS — H53462 Homonymous bilateral field defects, left side: Secondary | ICD-10-CM | POA: Insufficient documentation

## 2015-01-02 DIAGNOSIS — M25561 Pain in right knee: Secondary | ICD-10-CM

## 2015-01-02 DIAGNOSIS — I6381 Other cerebral infarction due to occlusion or stenosis of small artery: Secondary | ICD-10-CM

## 2015-01-02 DIAGNOSIS — M545 Low back pain: Secondary | ICD-10-CM | POA: Insufficient documentation

## 2015-01-02 DIAGNOSIS — M47816 Spondylosis without myelopathy or radiculopathy, lumbar region: Secondary | ICD-10-CM

## 2015-01-02 DIAGNOSIS — M479 Spondylosis, unspecified: Secondary | ICD-10-CM | POA: Diagnosis not present

## 2015-01-02 DIAGNOSIS — M25562 Pain in left knee: Secondary | ICD-10-CM

## 2015-01-02 DIAGNOSIS — I639 Cerebral infarction, unspecified: Secondary | ICD-10-CM | POA: Diagnosis not present

## 2015-01-02 MED ORDER — HYDROCODONE-ACETAMINOPHEN 5-325 MG PO TABS
1.0000 | ORAL_TABLET | Freq: Three times a day (TID) | ORAL | Status: DC | PRN
Start: 2015-01-02 — End: 2015-02-06

## 2015-01-02 NOTE — Patient Instructions (Signed)
Take Gabapentin 100 mg capsule you can take one capsule three times a day or  Take One capsule in the morning and two capsules at bedtime

## 2015-01-02 NOTE — Progress Notes (Signed)
Subjective:    Patient ID: Brittany Crawford, female    DOB: Feb 22, 1956, 59 y.o.   MRN: [id number]  HPI:Brittany Crawford is a 59 year old female who returns for follow up for chronic pain and medication refill. She says her pain is located in her lower back and bilateral knees/ Also left hand with tingling sensation. She rates her pain 8. Her current exercise regime is walking, leg raises and performing stretching exercises.  On 12/22/14 she went to [facility] ED for knee pain she was prescribed hydrocodone 10 tablets.  UDS reviewed consistent we will prescribe hydrocodone. Narcotic Contract reviewed she verbalizes understanding.  Pain Inventory Average Pain 9 Pain Right Now 8 My pain is constant, dull and aching  In the last 24 hours, has pain interfered with the following? General activity 5 Relation with others 5 Enjoyment of life 6 What TIME of day is your pain at its worst? daytime Sleep (in general) Fair  Pain is worse with: walking, standing and some activites Pain improves with: rest, heat/ice and medication Relief from Meds: 4  Mobility walk with assistance how many minutes can you walk? 10-15 ability to climb steps?  yes do you drive?  no  Function disabled: date disabled .  Neuro/Psych No problems in this area  Prior Studies Any changes since last visit?  no  Physicians involved in your care Any changes since last visit?  no   Family History  Problem Relation Age of Onset  . Colon cancer Maternal Uncle   . Stroke Mother    History   Social History  . Marital Status: Married    Spouse Name: N/A  . Number of Children: 2  . Years of Education: 12   Social History Main Topics  . Smoking status: Current Every Day Smoker -- 0.50 packs/day for 0 years    Types: Cigarettes  . Smokeless tobacco: Never Used     Comment: 1-2 cigarettes daily for years  . Alcohol Use: 1.2 oz/week    2 Cans of beer per week  . Drug Use: No  . Sexual Activity: No    Other Topics Concern  . None   Social History Narrative   Patient is married with 2 children.   Patient is right handed.   Patient has hs education.   Patient drinks 4 cups daily.   Past Surgical History  Procedure Laterality Date  . Foot surgery      left-pins placed  . Lymph node biopsy    . Dilatation & currettage/hysteroscopy with resectocope N/A 12/10/2012    Procedure: DILATATION & CURETTAGE/HYSTEROSCOPY WITH RESECTOCOPE;  Surgeon: Marvene Staff, MD;  Location: [facility];  Service: Gynecology;  Laterality: N/A;  . Polypectomy N/A 12/10/2012    Procedure: POLYPECTOMY;  Surgeon: Marvene Staff, MD;  Location: [facility];  Service: Gynecology;  Laterality: N/A;   Past Medical History  Diagnosis Date  . H/O sarcoidosis   . Hypertension   . Seasonal allergies   . Chronic back pain   . Stroke    Pulse 80  Resp 14  SpO2 100%  LMP 11/13/2012  Opioid Risk Score:   Fall Risk Score: Low Fall Risk (0-5 points)`1  Depression screen PHQ 2/9  Depression screen PHQ 2/9 01/02/2015  Decreased Interest 0  Down, Depressed, Hopeless 0  PHQ - 2 Score 0  Altered sleeping 0  Tired, decreased energy 0  Change in appetite 0  Feeling bad or failure about yourself  0  Trouble concentrating 0  Moving slowly or fidgety/restless 1  Suicidal thoughts 0  PHQ-9 Score 1     Review of Systems  Constitutional: Negative.   HENT: Negative.   Eyes: Negative.   Respiratory: Negative.   Cardiovascular: Negative.   Gastrointestinal: Negative.   Endocrine: Negative.   Genitourinary: Negative.   Musculoskeletal: Positive for back pain.       Knee pain and left hand  Skin: Negative.   Allergic/Immunologic: Negative.   Neurological: Negative.   Hematological: Negative.   Psychiatric/Behavioral: Negative.        Objective:   Physical Exam  Constitutional: She is oriented to person, place, and time. She appears well-developed and well-nourished.  HENT:  Head: Normocephalic and  atraumatic.  Neck: Normal range of motion. Neck supple.  Cardiovascular: Normal rate and regular rhythm.   Pulmonary/Chest: Effort normal and breath sounds normal.  Musculoskeletal:  Normal Muscle Bulk and Muscle Testing Reveals: Upper Extremities: Full ROM and Muscle Strength 5/5 Spinal Forward Flexion: 90 Degrees and Extension 20 Degrees Lumbar Paraspinal Tenderness: L-3- L-5 Lower Extremities: Full ROM and Muscle Strength 5/5 Arises from chair with ease Narrow Based gait.  Neurological: She is alert and oriented to person, place, and time.  Skin: Skin is warm and dry.  Psychiatric: She has a normal mood and affect.  Nursing note and vitals reviewed.         Assessment & Plan:  1. Lumbar spondylosis with DDD and facet arthropathy RX: Hydrocodone 5/325 mg one tablet every 8 hours as needed for pain #60. 2. Bilateral knee pain: Continue with heat and exercise 3. Right thalamic/internal capsule lacunar infarct with persistent left hemisensory deficits: Continue to Monitor   30 minutes of face to face patient care time was spent during this visit. All questions were encouraged and answered.  F/U in 1 month

## 2015-01-31 ENCOUNTER — Ambulatory Visit: Payer: Medicaid Other | Admitting: Physical Medicine & Rehabilitation

## 2015-02-06 ENCOUNTER — Encounter: Payer: Self-pay | Admitting: Physical Medicine & Rehabilitation

## 2015-02-06 ENCOUNTER — Encounter: Payer: Medicaid Other | Attending: Physical Medicine & Rehabilitation | Admitting: Physical Medicine & Rehabilitation

## 2015-02-06 VITALS — BP 138/81 | HR 68 | Resp 14

## 2015-02-06 DIAGNOSIS — M5136 Other intervertebral disc degeneration, lumbar region: Secondary | ICD-10-CM | POA: Insufficient documentation

## 2015-02-06 DIAGNOSIS — I69398 Other sequelae of cerebral infarction: Secondary | ICD-10-CM | POA: Diagnosis not present

## 2015-02-06 DIAGNOSIS — M25561 Pain in right knee: Secondary | ICD-10-CM | POA: Diagnosis not present

## 2015-02-06 DIAGNOSIS — H53462 Homonymous bilateral field defects, left side: Secondary | ICD-10-CM | POA: Diagnosis not present

## 2015-02-06 DIAGNOSIS — M129 Arthropathy, unspecified: Secondary | ICD-10-CM | POA: Insufficient documentation

## 2015-02-06 DIAGNOSIS — M47816 Spondylosis without myelopathy or radiculopathy, lumbar region: Secondary | ICD-10-CM | POA: Diagnosis not present

## 2015-02-06 DIAGNOSIS — M479 Spondylosis, unspecified: Secondary | ICD-10-CM | POA: Insufficient documentation

## 2015-02-06 DIAGNOSIS — I6381 Other cerebral infarction due to occlusion or stenosis of small artery: Secondary | ICD-10-CM

## 2015-02-06 DIAGNOSIS — M545 Low back pain: Secondary | ICD-10-CM | POA: Insufficient documentation

## 2015-02-06 DIAGNOSIS — I639 Cerebral infarction, unspecified: Secondary | ICD-10-CM

## 2015-02-06 MED ORDER — GABAPENTIN 100 MG PO CAPS: 100.0000 mg | ORAL_CAPSULE | Freq: Two times a day (BID) | ORAL | Status: DC

## 2015-02-06 MED ORDER — HYDROCODONE-ACETAMINOPHEN 5-325 MG PO TABS
1.0000 | ORAL_TABLET | Freq: Three times a day (TID) | ORAL | Status: DC | PRN
Start: 2015-02-06 — End: 2015-03-08

## 2015-02-06 NOTE — Progress Notes (Signed)
Subjective:    Patient ID: Brittany Crawford, female    DOB: [birth year], 59 y.o.   MRN: [id number]  HPI   Brittany Crawford is here in follow up of her chronic pain. She is having a bad "phase" this week and her pain levels are increased. Her low back is painful as well as her left arm and legs. Her knees ache too at times.   She has worked on posture and maintenance exercises. She uses heat for pain relief, which seems to help. She has tried to do some more walking as well. Her exercise options are limited somewhat due to transportation. She has also worked on the facet exercises we discussed.  The hydrocodone helps to cut her pain. She is using 2 per day on avg. She ran short as her appt was missed last week. She feels that the gabapentin helps with sleep and pain as well but she's not always using the morning dose.      Pain Inventory Average Pain 8 Pain Right Now 8 My pain is constant, dull and aching  In the last 24 hours, has pain interfered with the following? General activity 5 Relation with others 5 Enjoyment of life 6 What TIME of day is your pain at its worst? daytime Sleep (in general) Fair  Pain is worse with: bending, sitting and standing Pain improves with: rest, heat/ice, therapy/exercise and medication Relief from Meds: 5  Mobility ability to climb steps?  yes do you drive?  no Do you have any goals in this area?  yes  Function disabled: date disabled .  Neuro/Psych weakness numbness trouble walking  Prior Studies Any changes since last visit?  no  Physicians involved in your care Any changes since last visit?  no   Family History  Problem Relation Age of Onset  . Colon cancer Maternal Uncle   . Stroke Mother    History   Social History  . Marital Status: Married    Spouse Name: N/A  . Number of Children: 2  . Years of Education: 12   Social History Main Topics  . Smoking status: Current Every Day Smoker -- 0.50 packs/day for 0 years   Types: Cigarettes  . Smokeless tobacco: Never Used     Comment: 1-2 cigarettes daily for years  . Alcohol Use: 1.2 oz/week    2 Cans of beer per week  . Drug Use: No  . Sexual Activity: No   Other Topics Concern  . None   Social History Narrative   Patient is married with 2 children.   Patient is right handed.   Patient has hs education.   Patient drinks 4 cups daily.   Past Surgical History  Procedure Laterality Date  . Foot surgery      left-pins placed  . Lymph node biopsy    . Dilatation & currettage/hysteroscopy with resectocope N/A 12/10/2012    Procedure: DILATATION & CURETTAGE/HYSTEROSCOPY WITH RESECTOCOPE;  Surgeon: Marvene Staff, MD;  Location: [facility];  Service: Gynecology;  Laterality: N/A;  . Polypectomy N/A 12/10/2012    Procedure: POLYPECTOMY;  Surgeon: Marvene Staff, MD;  Location: [facility];  Service: Gynecology;  Laterality: N/A;   Past Medical History  Diagnosis Date  . H/O sarcoidosis   . Hypertension   . Seasonal allergies   . Chronic back pain   . Stroke    BP 138/81 mmHg  Pulse 68  Resp 14  SpO2 99%  LMP 11/13/2012  Opioid Risk Score:  Fall Risk Score: Low Fall Risk (0-5 points)`1  Depression screen PHQ 2/9  Depression screen PHQ 2/9 01/02/2015  Decreased Interest 0  Down, Depressed, Hopeless 0  PHQ - 2 Score 0  Altered sleeping 0  Tired, decreased energy 0  Change in appetite 0  Feeling bad or failure about yourself  0  Trouble concentrating 0  Moving slowly or fidgety/restless 1  Suicidal thoughts 0  PHQ-9 Score 1     Review of Systems  Musculoskeletal: Positive for gait problem.  Neurological: Positive for weakness.  All other systems reviewed and are negative.      Objective:   Physical Exam   General: Alert and oriented x 3, No apparent distress  HEENT: Head is normocephalic, atraumatic, PERRLA, EOMI, sclera anicteric, oral mucosa pink and moist, dentition intact, ext ear canals clear,  Neck: Supple without  JVD or lymphadenopathy  Heart: Reg rate and rhythm. No murmurs rubs or gallops  Chest: CTA bilaterally without wheezes, rales, or rhonchi; no distress  Abdomen: Soft, non-tender, non-distended, bowel sounds positive.  Extremities: No clubbing, cyanosis, or edema. Pulses are 2+  Skin: Clean and intact without signs of breakdown  Neuro: Pt is cognitively appropriate with normal insight, memory, and awareness. Left eye weak with superior, inferior, and lateral tracking. Sees double when she looks to the left. No HH. No obvious facial weakness. Sensory exam is normal on right and 1+ on left face, arm, leg Reflexes are 1+ in all 4's. Fine motor coordination is intact. No tremors. Motor function is grossly 5/5 in all 4s. No resting tone on the left.  Musculoskeletal: Has pain along L4-5 and L5-S1 levels. Severe at L5-S1 with palpation. Had pain with extension and bilateral facet maneuvers.   Tender with side bending, less so with rotation. Able to bend fairly easily and touch toes but still has pain with when coming back to neutral. Pelvis appears fairly balanced, although she may hold the right side up a bit her than the left. She doesn't seem to walk with any gross antalgia on either side. SLR and SST negative. No obvious scoliosis. She does tend to rotate clockwise by a few degrees when she bends at the waist.  Psych: Pt's affect is anxious, she is impulsive--generally cooperative and pleasant   Assessment & Plan:   1. Lumbar spondylosis with DDD and facet arthropathy. Sx appear most prominent at L5-S1 on exam 2. Right knee pain---appears minimal on exam today  3. Right thalamic/internal capsule lacunar infarct with persistent left hemisensory deficits and pain syndrome. 4. ?old BI as youth  5. Hx of marijuana use.    Plan:  1. Continue meloxicam daily as long as ok with neuro  2. Want gabapentin scheduled at 200mg  pm and 100mg  qam 3. Hydrocodone 5/325 one q8 prn for breakthrough pain, increase to  #75  4. Continue facet based exercises were provided along with extensive education. Needs to work on stretching as part of her regular exercise program.  5. Need to see injection records from Dr. Brien Few.  6. Meloxicam prn (not scheduled while she's taking ECASA) 7. Follow up with me in 2 months. See my NP in 1 month. 25 minutes of face to face patient care time were spent during this visit. All questions were encouraged and answered.

## 2015-02-06 NOTE — Patient Instructions (Signed)
NEED TO SEE INJECTION REPORTS   CONTINUE WITH YOUR FACET EXERCISES AND STRETCHES EACH DAY!!!! THIS IS WHAT'S MOST IMPORTANT FOR YOU

## 2015-03-08 ENCOUNTER — Encounter: Payer: Self-pay | Admitting: Registered Nurse

## 2015-03-08 ENCOUNTER — Encounter: Payer: Medicaid Other | Attending: Physical Medicine & Rehabilitation | Admitting: Registered Nurse

## 2015-03-08 VITALS — BP 152/90 | HR 90 | Resp 14

## 2015-03-08 DIAGNOSIS — M129 Arthropathy, unspecified: Secondary | ICD-10-CM | POA: Insufficient documentation

## 2015-03-08 DIAGNOSIS — M25561 Pain in right knee: Secondary | ICD-10-CM

## 2015-03-08 DIAGNOSIS — H53462 Homonymous bilateral field defects, left side: Secondary | ICD-10-CM | POA: Insufficient documentation

## 2015-03-08 DIAGNOSIS — M479 Spondylosis, unspecified: Secondary | ICD-10-CM | POA: Insufficient documentation

## 2015-03-08 DIAGNOSIS — M7061 Trochanteric bursitis, right hip: Secondary | ICD-10-CM | POA: Diagnosis not present

## 2015-03-08 DIAGNOSIS — M47816 Spondylosis without myelopathy or radiculopathy, lumbar region: Secondary | ICD-10-CM | POA: Diagnosis not present

## 2015-03-08 DIAGNOSIS — I639 Cerebral infarction, unspecified: Secondary | ICD-10-CM

## 2015-03-08 DIAGNOSIS — I6381 Other cerebral infarction due to occlusion or stenosis of small artery: Secondary | ICD-10-CM

## 2015-03-08 DIAGNOSIS — Z5181 Encounter for therapeutic drug level monitoring: Secondary | ICD-10-CM

## 2015-03-08 DIAGNOSIS — M5136 Other intervertebral disc degeneration, lumbar region: Secondary | ICD-10-CM | POA: Insufficient documentation

## 2015-03-08 DIAGNOSIS — Z79899 Other long term (current) drug therapy: Secondary | ICD-10-CM

## 2015-03-08 DIAGNOSIS — M545 Low back pain: Secondary | ICD-10-CM | POA: Diagnosis not present

## 2015-03-08 DIAGNOSIS — I69398 Other sequelae of cerebral infarction: Secondary | ICD-10-CM | POA: Diagnosis not present

## 2015-03-08 MED ORDER — HYDROCODONE-ACETAMINOPHEN 5-325 MG PO TABS: 1.0000 | ORAL_TABLET | Freq: Three times a day (TID) | ORAL | Status: DC | PRN

## 2015-03-08 NOTE — Progress Notes (Signed)
Subjective:    Patient ID: Brittany Crawford, female    DOB: Jan 23, 1956, 59 y.o.   MRN: [id number]  HPI: [name] is a 59 year old female who returns for follow up for chronic pain and medication refill. She says her pain is located in her lower back, right hip and right knee.She rates her pain 9. Her current exercise regime is walking, leg raises and performing stretching exercises.   Pain Inventory Average Pain 7 Pain Right Now 9 My pain is constant, sharp, stabbing and aching  In the last 24 hours, has pain interfered with the following? General activity 5 Relation with others 5 Enjoyment of life 5 What TIME of day is your pain at its worst? VARIES - pain wakes her up at night Sleep (in general) Poor  Pain is worse with: walking, bending, standing and some activites Pain improves with: rest and medication Relief from Meds: 2  Mobility walk without assistance how many minutes can you walk? 5 ability to climb steps?  yes do you drive?  yes  Function disabled: date disabled .  Neuro/Psych numbness tingling spasms  Prior Studies Any changes since last visit?  no  Physicians involved in your care Any changes since last visit?  no   Family History  Problem Relation Age of Onset  . Colon cancer Maternal Uncle   . Stroke Mother    History   Social History  . Marital Status: Married    Spouse Name: N/A  . Number of Children: 2  . Years of Education: 12   Social History Main Topics  . Smoking status: Current Every Day Smoker -- 0.50 packs/day for 0 years    Types: Cigarettes  . Smokeless tobacco: Never Used     Comment: 1-2 cigarettes daily for years  . Alcohol Use: 1.2 oz/week    2 Cans of beer per week  . Drug Use: No  . Sexual Activity: No   Other Topics Concern  . None   Social History Narrative   Patient is married with 2 children.   Patient is right handed.   Patient has hs education.   Patient drinks 4 cups daily.   Past  Surgical History  Procedure Laterality Date  . Foot surgery      left-pins placed  . Lymph node biopsy    . Dilatation & currettage/hysteroscopy with resectocope N/A 12/10/2012    Procedure: DILATATION & CURETTAGE/HYSTEROSCOPY WITH RESECTOCOPE;  Surgeon: Marvene Staff, MD;  Location: [facility];  Service: Gynecology;  Laterality: N/A;  . Polypectomy N/A 12/10/2012    Procedure: POLYPECTOMY;  Surgeon: Marvene Staff, MD;  Location: [facility];  Service: Gynecology;  Laterality: N/A;   Past Medical History  Diagnosis Date  . H/O sarcoidosis   . Hypertension   . Seasonal allergies   . Chronic back pain   . Stroke    BP 152/90 mmHg  Pulse 90  Resp 14  SpO2 96%  LMP 11/13/2012  Opioid Risk Score:   Fall Risk Score: Low Fall Risk (0-5 points)`1  Depression screen PHQ 2/9  Depression screen PHQ 2/9 01/02/2015  Decreased Interest 0  Down, Depressed, Hopeless 0  PHQ - 2 Score 0  Altered sleeping 0  Tired, decreased energy 0  Change in appetite 0  Feeling bad or failure about yourself  0  Trouble concentrating 0  Moving slowly or fidgety/restless 1  Suicidal thoughts 0  PHQ-9 Score 1     Review of  Systems  Constitutional: Negative.   HENT: Negative.   Eyes: Negative.   Respiratory: Negative.   Cardiovascular: Negative.   Gastrointestinal: Negative.   Endocrine: Positive for cold intolerance.  Genitourinary: Negative.   Musculoskeletal: Positive for myalgias, back pain and arthralgias.  Skin: Negative.   Allergic/Immunologic: Negative.   Neurological: Positive for numbness.       Tingling, spasms  Hematological: Negative.   Psychiatric/Behavioral: Negative.        Objective:   Physical Exam  Constitutional: She is oriented to person, place, and time. She appears well-developed and well-nourished.  HENT:  Head: Normocephalic and atraumatic.  Neck: Normal range of motion. Neck supple.  Cardiovascular: Normal rate and regular rhythm.   Pulmonary/Chest: Effort  normal and breath sounds normal.  Musculoskeletal:  Normal Muscle Bulk and Muscle Testing Reveals: Upper Extremities: Full ROM and Muscle Strength 5/5 Back without spinal or paraspinal tenderness Right Greater Trochanteric tenderness Lower Extremities: Full ROM and Muscle strength 5/5 Right Lower Extremity Flexion Produces Pain into Patella Arises from chair with ease Narrow Based Gait   Neurological: She is alert and oriented to person, place, and time.  Skin: Skin is warm and dry.  Psychiatric: She has a normal mood and affect.  Nursing note and vitals reviewed.         Assessment & Plan:  1. Lumbar spondylosis with DDD and facet arthropathy RX: Hydrocodone 5/325 mg one tablet every 8 hours as needed for pain #75. 2. Right knee pain: Continue with heat/ice and exercise. Voltaren Gel sample given/ Alternate with Mobic 3. Right thalamic/internal capsule lacunar infarct with persistent left hemisensory deficits: Continue to Monitor  20 minutes of face to face patient care time was spent during this visit. All questions were encouraged and answered.  F/U in 1 month

## 2015-03-27 ENCOUNTER — Telehealth: Payer: Self-pay | Admitting: *Deleted

## 2015-03-27 DIAGNOSIS — M25551 Pain in right hip: Secondary | ICD-10-CM

## 2015-03-27 NOTE — Telephone Encounter (Signed)
Pt called 2 weeks prior to her appt as instructed. Would like you to call her back

## 2015-04-03 NOTE — Telephone Encounter (Signed)
Return the call, Brittany Crawford having increase intensity of right hip pain. X-ray ordered. She verbalizes understanding. Encouraged to continue alternating heat and ice therapy.

## 2015-04-03 NOTE — Telephone Encounter (Signed)
Patient would like for Zella Ball to give her a call

## 2015-04-04 ENCOUNTER — Telehealth: Payer: Self-pay | Admitting: Registered Nurse

## 2015-04-04 ENCOUNTER — Ambulatory Visit (HOSPITAL_COMMUNITY)
Admission: RE | Admit: 2015-04-04 | Discharge: 2015-04-04 | Disposition: A | Discharge status: Home / Self Care | Payer: Medicaid Other | Source: Ambulatory Visit | Attending: Registered Nurse | Admitting: Registered Nurse

## 2015-04-04 DIAGNOSIS — M25551 Pain in right hip: Secondary | ICD-10-CM | POA: Diagnosis present

## 2015-04-04 DIAGNOSIS — M160 Bilateral primary osteoarthritis of hip: Secondary | ICD-10-CM | POA: Diagnosis not present

## 2015-04-04 NOTE — Telephone Encounter (Signed)
Placed order for Xray due to error entry.

## 2015-04-06 ENCOUNTER — Other Ambulatory Visit: Payer: Self-pay | Admitting: Registered Nurse

## 2015-04-06 ENCOUNTER — Encounter: Payer: Self-pay | Admitting: Registered Nurse

## 2015-04-06 ENCOUNTER — Encounter: Payer: Medicaid Other | Attending: Physical Medicine & Rehabilitation | Admitting: Registered Nurse

## 2015-04-06 VITALS — BP 133/85 | HR 81 | Resp 16

## 2015-04-06 DIAGNOSIS — M129 Arthropathy, unspecified: Secondary | ICD-10-CM | POA: Diagnosis not present

## 2015-04-06 DIAGNOSIS — M25551 Pain in right hip: Secondary | ICD-10-CM

## 2015-04-06 DIAGNOSIS — M5136 Other intervertebral disc degeneration, lumbar region: Secondary | ICD-10-CM | POA: Diagnosis not present

## 2015-04-06 DIAGNOSIS — M479 Spondylosis, unspecified: Secondary | ICD-10-CM | POA: Diagnosis not present

## 2015-04-06 DIAGNOSIS — H53462 Homonymous bilateral field defects, left side: Secondary | ICD-10-CM | POA: Diagnosis not present

## 2015-04-06 DIAGNOSIS — M545 Low back pain: Secondary | ICD-10-CM | POA: Diagnosis not present

## 2015-04-06 DIAGNOSIS — M47816 Spondylosis without myelopathy or radiculopathy, lumbar region: Secondary | ICD-10-CM

## 2015-04-06 DIAGNOSIS — Z79899 Other long term (current) drug therapy: Secondary | ICD-10-CM | POA: Diagnosis not present

## 2015-04-06 DIAGNOSIS — I69398 Other sequelae of cerebral infarction: Secondary | ICD-10-CM | POA: Diagnosis not present

## 2015-04-06 DIAGNOSIS — M25561 Pain in right knee: Secondary | ICD-10-CM | POA: Insufficient documentation

## 2015-04-06 DIAGNOSIS — Z5181 Encounter for therapeutic drug level monitoring: Secondary | ICD-10-CM | POA: Diagnosis not present

## 2015-04-06 MED ORDER — HYDROCODONE-ACETAMINOPHEN 7.5-325 MG PO TABS: 1.0000 | ORAL_TABLET | Freq: Three times a day (TID) | ORAL | Status: DC | PRN

## 2015-04-06 NOTE — Progress Notes (Signed)
Subjective:    Patient ID: Brittany Crawford, female    DOB: [birth year], 57 y.o.   MRN: [id number]  HPI: Brittany Crawford is a 59 year old female who returns for follow up for chronic pain and medication refill. She says her pain is located in her lower back, right hip and right knee. She has tingling and numbness in her bilateral hands and left foot.Also states her pain has intensified in her right hip, she had an X-ray.  Will increase her hydrocodone this month and re-evaluate next month. She verbalizes understanding.She rates her pain 8. Her current exercise regime is walking, leg raises and performing stretching exercises.  X-Ray Results:  FINDINGS: No acute fracture or dislocation is identified. There is mild joint space narrowing and marginal osteophytosis involving the right greater than left hips. No lytic or blastic osseous lesion is identified. No soft tissue abnormality is seen. Prominent right-sided facet arthrosis is noted at L4-5.  IMPRESSION: Mild right greater than left hip osteoarthrosis. No acute osseous abnormality identified.  She would like to follow up with her orthopedist, referral placed.   Pain Inventory Average Pain 8 Pain Right Now 8 My pain is sharp, dull and tingling  In the last 24 hours, has pain interfered with the following? General activity 7 Relation with others 8 Enjoyment of life 8 What TIME of day is your pain at its worst? evening and night Sleep (in general) NA  Pain is worse with: walking, bending and some activites Pain improves with: rest, heat/ice, therapy/exercise and medication Relief from Meds: did not answer  Mobility use a cane ability to climb steps?  yes do you drive?  no  Function disabled: date disabled . I need assistance with the following:  household duties  Neuro/Psych weakness numbness  Prior Studies Any changes since last visit?  yes  Physicians involved in your care Any changes since last visit?   no   Family History  Problem Relation Age of Onset  . Colon cancer Maternal Uncle   . Stroke Mother    History   Social History  . Marital Status: Married    Spouse Name: N/A  . Number of Children: 2  . Years of Education: 12   Social History Main Topics  . Smoking status: Current Every Day Smoker -- 0.50 packs/day for 0 years    Types: Cigarettes  . Smokeless tobacco: Never Used     Comment: 1-2 cigarettes daily for years  . Alcohol Use: 1.2 oz/week    2 Cans of beer per week  . Drug Use: No  . Sexual Activity: No   Other Topics Concern  . None   Social History Narrative   Patient is married with 2 children.   Patient is right handed.   Patient has hs education.   Patient drinks 4 cups daily.   Past Surgical History  Procedure Laterality Date  . Foot surgery      left-pins placed  . Lymph node biopsy    . Dilatation & currettage/hysteroscopy with resectocope N/A 12/10/2012    Procedure: DILATATION & CURETTAGE/HYSTEROSCOPY WITH RESECTOCOPE;  Surgeon: Marvene Staff, MD;  Location: [facility];  Service: Gynecology;  Laterality: N/A;  . Polypectomy N/A 12/10/2012    Procedure: POLYPECTOMY;  Surgeon: Marvene Staff, MD;  Location: [facility];  Service: Gynecology;  Laterality: N/A;   Past Medical History  Diagnosis Date  . H/O sarcoidosis   . Hypertension   . Seasonal allergies   .  Chronic back pain   . Stroke    BP 133/85 mmHg  Pulse 81  Resp 16  SpO2 99%  LMP 11/13/2012  Opioid Risk Score:   Fall Risk Score: Low Fall Risk (0-5 points)`1  Depression screen PHQ 2/9  Depression screen PHQ 2/9 01/02/2015  Decreased Interest 0  Down, Depressed, Hopeless 0  PHQ - 2 Score 0  Altered sleeping 0  Tired, decreased energy 0  Change in appetite 0  Feeling bad or failure about yourself  0  Trouble concentrating 0  Moving slowly or fidgety/restless 1  Suicidal thoughts 0  PHQ-9 Score 1    Review of Systems  Neurological: Positive for weakness and  numbness.  All other systems reviewed and are negative.      Objective:   Physical Exam  Constitutional: She is oriented to person, place, and time. She appears well-developed and well-nourished.  HENT:  Head: Normocephalic and atraumatic.  Neck: Normal range of motion. Neck supple.  Cardiovascular: Normal rate and regular rhythm.   Pulmonary/Chest: Effort normal and breath sounds normal.  Musculoskeletal:  Normal Muscle Bulk and Muscle Testing Reveals: Upper Extremities: Full ROM and Muscle Strength 5/5 Back without spinal or paraspinal tenderness Right Greater Trochanteric tenderness Lower Extremities: Full ROM and Muscle Strength 5/5 Arises from Chair with ease Narrow based gait  Neurological: She is alert and oriented to person, place, and time.  Skin: Skin is warm and dry.  Psychiatric: She has a normal mood and affect.  Nursing note and vitals reviewed.         Assessment & Plan:  1. Lumbar spondylosis with DDD and facet arthropathy RX: Hydrocodone 7.5 /325 mg one tablet every 8 hours as needed for pain #75. 2. Right knee pain: Continue with heat/ice and exercise. Voltaren Gel sample given/ Alternate with Mobic 3. Right thalamic/internal capsule lacunar infarct with persistent left hemisensory deficits: Continue to Monitor  20 minutes of face to face patient care time was spent during this visit. All questions were encouraged and answered.  F/U in 1 month

## 2015-04-07 LAB — PMP ALCOHOL METABOLITE (ETG): Ethyl Glucuronide (EtG): NEGATIVE ng/mL

## 2015-04-10 LAB — OPIATES/OPIOIDS (LC/MS-MS)
Codeine Urine: NEGATIVE ng/mL (ref <50)
Hydrocodone: 142 ng/mL (ref <50)
Hydromorphone: 63 ng/mL (ref <50)
Morphine Urine: NEGATIVE ng/mL (ref <50)
Norhydrocodone, Ur: 179 ng/mL (ref <50)
Noroxycodone, Ur: NEGATIVE ng/mL (ref <50)
OXYMORPHONE, URINE: NEGATIVE ng/mL (ref <50)
Oxycodone, ur: NEGATIVE ng/mL (ref <50)

## 2015-04-11 LAB — PRESCRIPTION MONITORING PROFILE (SOLSTAS)
Amphetamine/Meth: NEGATIVE ng/mL
BARBITURATE SCREEN, URINE: NEGATIVE ng/mL
BENZODIAZEPINE SCREEN, URINE: NEGATIVE ng/mL
BUPRENORPHINE, URINE: NEGATIVE ng/mL
CANNABINOID SCRN UR: NEGATIVE ng/mL
Carisoprodol, Urine: NEGATIVE ng/mL
Cocaine Metabolites: NEGATIVE ng/mL
Creatinine, Urine: 29.34 mg/dL (ref >20.0)
FENTANYL URINE: NEGATIVE ng/mL
MDMA URINE: NEGATIVE ng/mL
Meperidine, Ur: NEGATIVE ng/mL
Methadone Screen, Urine: NEGATIVE ng/mL
NITRITES URINE, INITIAL: NEGATIVE ug/mL
Oxycodone Screen, Ur: NEGATIVE ng/mL
PROPOXYPHENE: NEGATIVE ng/mL
TAPENTADOLUR: NEGATIVE ng/mL
Tramadol Scrn, Ur: NEGATIVE ng/mL
Zolpidem, Urine: NEGATIVE ng/mL
pH, Initial: 5.5 pH (ref 4.5–8.9)

## 2015-04-24 NOTE — Progress Notes (Signed)
Urine drug screen for this encounter is consistent for prescribed medication 

## 2015-05-08 ENCOUNTER — Encounter: Payer: Medicaid Other | Attending: Physical Medicine & Rehabilitation | Admitting: Registered Nurse

## 2015-05-08 DIAGNOSIS — I69398 Other sequelae of cerebral infarction: Secondary | ICD-10-CM | POA: Insufficient documentation

## 2015-05-08 DIAGNOSIS — M5136 Other intervertebral disc degeneration, lumbar region: Secondary | ICD-10-CM | POA: Insufficient documentation

## 2015-05-08 DIAGNOSIS — M545 Low back pain: Secondary | ICD-10-CM | POA: Insufficient documentation

## 2015-05-08 DIAGNOSIS — H53462 Homonymous bilateral field defects, left side: Secondary | ICD-10-CM | POA: Insufficient documentation

## 2015-05-08 DIAGNOSIS — M129 Arthropathy, unspecified: Secondary | ICD-10-CM | POA: Insufficient documentation

## 2015-05-08 DIAGNOSIS — M479 Spondylosis, unspecified: Secondary | ICD-10-CM | POA: Insufficient documentation

## 2015-05-08 DIAGNOSIS — M25561 Pain in right knee: Secondary | ICD-10-CM | POA: Insufficient documentation

## 2015-05-15 ENCOUNTER — Encounter: Payer: Self-pay | Admitting: Registered Nurse

## 2015-05-15 ENCOUNTER — Encounter: Payer: Medicaid Other | Attending: Physical Medicine & Rehabilitation | Admitting: Registered Nurse

## 2015-05-15 VITALS — BP 134/97 | HR 75 | Resp 16

## 2015-05-15 DIAGNOSIS — M545 Low back pain: Secondary | ICD-10-CM | POA: Diagnosis present

## 2015-05-15 DIAGNOSIS — M129 Arthropathy, unspecified: Secondary | ICD-10-CM | POA: Diagnosis not present

## 2015-05-15 DIAGNOSIS — M25561 Pain in right knee: Secondary | ICD-10-CM | POA: Diagnosis not present

## 2015-05-15 DIAGNOSIS — M47816 Spondylosis without myelopathy or radiculopathy, lumbar region: Secondary | ICD-10-CM

## 2015-05-15 DIAGNOSIS — M5136 Other intervertebral disc degeneration, lumbar region: Secondary | ICD-10-CM | POA: Insufficient documentation

## 2015-05-15 DIAGNOSIS — I69398 Other sequelae of cerebral infarction: Secondary | ICD-10-CM | POA: Insufficient documentation

## 2015-05-15 DIAGNOSIS — H53462 Homonymous bilateral field defects, left side: Secondary | ICD-10-CM | POA: Diagnosis not present

## 2015-05-15 DIAGNOSIS — M479 Spondylosis, unspecified: Secondary | ICD-10-CM | POA: Insufficient documentation

## 2015-05-15 DIAGNOSIS — Z79899 Other long term (current) drug therapy: Secondary | ICD-10-CM

## 2015-05-15 DIAGNOSIS — Z5181 Encounter for therapeutic drug level monitoring: Secondary | ICD-10-CM

## 2015-05-15 DIAGNOSIS — M25551 Pain in right hip: Secondary | ICD-10-CM

## 2015-05-15 MED ORDER — HYDROCODONE-ACETAMINOPHEN 7.5-325 MG PO TABS: 1.0000 | ORAL_TABLET | Freq: Three times a day (TID) | ORAL | Status: DC | PRN

## 2015-05-15 NOTE — Progress Notes (Signed)
Subjective:    Patient ID: Brittany Crawford, female    DOB: [birth year], 57 y.o.   MRN: [id number]  HPI: Brittany Crawford is a 59 year old female who returns for follow up for chronic pain and medication refill. She says her pain is located in her right hip and right knee. Also states her pain has intensified in her right hip and knee. Her PCP ( Dr. Silvio Pate) was going to prescribed Bryson Dames, Brittany Crawford states she let her know she was under contract in this office. I will speak with Dr. Naaman Plummer she verbalizes understanding. She rates her pain 4.Her current exercise regime is walking short distances and performing stretching exercises.  She's awaiting appointment with Rheumatologist.  Pain Inventory Average Pain 6 Pain Right Now 4 My pain is sharp, burning, dull, stabbing, tingling and aching  In the last 24 hours, has pain interfered with the following? General activity 9 Relation with others 10 Enjoyment of life 10 What TIME of day is your pain at its worst? evening and night Sleep (in general) Fair  Pain is worse with: bending, standing and some activites Pain improves with: rest, heat/ice, therapy/exercise and medication Relief from Meds: 4  Mobility use a cane how many minutes can you walk? 1 block ability to climb steps?  no do you drive?  no  Function disabled: date disabled .  Neuro/Psych weakness numbness trouble walking  Prior Studies Any changes since last visit?  no  Physicians involved in your care Any changes since last visit?  no   Family History  Problem Relation Age of Onset  . Colon cancer Maternal Uncle   . Stroke Mother    History   Social History  . Marital Status: Married    Spouse Name: N/A  . Number of Children: 2  . Years of Education: 12   Social History Main Topics  . Smoking status: Current Every Day Smoker -- 0.50 packs/day for 0 years    Types: Cigarettes  . Smokeless tobacco: Never Used     Comment: 1-2 cigarettes daily  for years  . Alcohol Use: 1.2 oz/week    2 Cans of beer per week  . Drug Use: No  . Sexual Activity: No   Other Topics Concern  . None   Social History Narrative   Patient is married with 2 children.   Patient is right handed.   Patient has hs education.   Patient drinks 4 cups daily.   Past Surgical History  Procedure Laterality Date  . Foot surgery      left-pins placed  . Lymph node biopsy    . Dilatation & currettage/hysteroscopy with resectocope N/A 12/10/2012    Procedure: DILATATION & CURETTAGE/HYSTEROSCOPY WITH RESECTOCOPE;  Surgeon: Marvene Staff, MD;  Location: [facility];  Service: Gynecology;  Laterality: N/A;  . Polypectomy N/A 12/10/2012    Procedure: POLYPECTOMY;  Surgeon: Marvene Staff, MD;  Location: [facility];  Service: Gynecology;  Laterality: N/A;   Past Medical History  Diagnosis Date  . H/O sarcoidosis   . Hypertension   . Seasonal allergies   . Chronic back pain   . Stroke    BP 134/97 mmHg  Pulse 75  Resp 16  SpO2 95%  LMP 11/13/2012  Opioid Risk Score:   Fall Risk Score:  `1  Depression screen PHQ 2/9  Depression screen [facility] 2/9 05/15/2015 01/02/2015  Decreased Interest 0 0  Down, Depressed, Hopeless 0 0  PHQ - 2 Score  0 0  Altered sleeping - 0  Tired, decreased energy - 0  Change in appetite - 0  Feeling bad or failure about yourself  - 0  Trouble concentrating - 0  Moving slowly or fidgety/restless - 1  Suicidal thoughts - 0  PHQ-9 Score - 1     Review of Systems  Respiratory: Positive for shortness of breath.   All other systems reviewed and are negative.      Objective:   Physical Exam        Assessment & Plan:  1. Lumbar spondylosis with DDD and facet arthropathy RX: Hydrocodone 7.5 /325 mg one tablet every 8 hours as needed for pain #75. Second script given to accommodate scheduled appointment. 2. Right knee pain: Continue with heat/ice and exercise. Voltaren Gel sample given/ Alternate with Mobic 3. Right  thalamic/internal capsule lacunar infarct with persistent left hemisensory deficits: Continue to Monitor  20 minutes of face to face patient care time was spent during this visit. All questions were encouraged and answered.  F/U in 1 month

## 2015-05-16 ENCOUNTER — Telehealth: Payer: Self-pay | Admitting: Registered Nurse

## 2015-05-16 NOTE — Telephone Encounter (Signed)
Spoke with Dr. Maud Deed this afternoon regarding Ms. Aldrete right hip and right knee pain. He would like for her to see the rheumatologist and continue with heat/ice therapy prior to starting Long acting narcotic. This was relayed to Ms. Fennelly she verbalizes understanding.

## 2015-06-14 ENCOUNTER — Ambulatory Visit (INDEPENDENT_AMBULATORY_CARE_PROVIDER_SITE_OTHER): Payer: Medicaid Other | Admitting: Neurology

## 2015-06-14 ENCOUNTER — Encounter: Payer: Self-pay | Admitting: Neurology

## 2015-06-14 VITALS — BP 115/76 | HR 88 | Ht 67.0 in | Wt 199.2 lb

## 2015-06-14 DIAGNOSIS — R202 Paresthesia of skin: Secondary | ICD-10-CM | POA: Diagnosis not present

## 2015-06-14 NOTE — Patient Instructions (Signed)
I had a long d/w patient about her remote stroke, risk for recurrent stroke/TIAs, personally independently reviewed imaging studies and stroke evaluation results and answered questions.Continue aspirin 81 mg orally every day  for secondary stroke prevention and maintain strict control of hypertension with blood pressure goal below 130/90, diabetes with hemoglobin A1c goal below 6.5% and lipids with LDL cholesterol goal below 100 mg/dL. I also advised the patient to eat a healthy diet with plenty of whole grains, cereals, fruits and vegetables, exercise regularly and maintain ideal body weight. Continue gabapentin for post stroke paresthesias. Followup in the future with me in  1 year or call earlier if needed

## 2015-06-14 NOTE — Progress Notes (Signed)
[facility] [address] [place]. [place] [address] [phone]       OFFICE FOLLOW-UP NOTE  [name] Date of Birth:  1956-09-18 Medical Record Number:  [id number]   HPI: 59 year Caucasian lady who woke on  morning  of, 07/03/2014, time unknown) and noted she had a funny feeling in her left foot, leg, arm and face. She felt as it was decreased. She denies any other symptoms such as weakness, blurred or double vision, dysarthria or dysphagia.  . Patient was not administered TPA secondary to delay in arrival. She was admitted for further evaluation and treatment.MRI scan of the brain which I personally reviewed shows an acute right thalamic/internal capsule infarct and MRA of the brain showed no large vessel stenosis. Transthoracic echo showed normal ejection fraction without cardiac source of embolism. Carotid Dopplers showed no significant extracranial stenosis. Vascular risk factors identified include hypertension, hyperlipidemia, smoking and marijuana use. Patient was started on Plavix for secondary stroke prevention and advise aggressive blood pressure and cholesterol control and consult to quit smoking and marijuana. She states that she has tried quitting smoking but unsuccessfully as her husband still smokes. She has noted improvement in her paresthesias but still has some residual paresthesias involving the left cheek as well as fingertips in the left hand. She is annoyed by this sensation but is learning to live with it. She has noted improvement in her headache and feels her blood pressure is much better controlled. She has no other new complaints Update 12/11/2014 : She returns for follow-up after last visit 3 months ago. She continues to do well without recurrent stroke or TIA symptoms. She still has some intermittent tingling in the left hand as well as some left leg weakness but overall she feels she is walking a lot better. She has difficulty falling asleep.  She also has chronic back pain and she plans to see Dr. Tessa Lerner from rehabilitation soon for this. She is tolerating aspirin well without significant bleeding or bruising. She states her blood pressure and cholesterol are both have been under good control. She has no new neurological symptoms. Update 06/14/2015 : She returns for follow-up after last visit 6 months ago. She continues to do well from stroke standpoint without recurrent stroke or TIA symptoms. He continues to have post stroke paresthesias on the left and takes gabapentin when necessary which seems to help. He states her blood pressure is well controlled and is on 1/76 today. She remains on aspirin but states that when she has arthritis pain she takes meloxicam and she has been told by the pain clinic doctors not to take aspirin. She had lipid profile checked 3 months ago which was good. She remains on Lipitor which is tolerating well without any side effects. She has no new complaints today.  ROS:   14 system review of systems is positive for  Joint pain, headache, paresthesiasnd all other systems negative  PMH:  Past Medical History  Diagnosis Date  . H/O sarcoidosis   . Hypertension   . Seasonal allergies   . Chronic back pain   . Stroke     Social History:  Social History   Social History  . Marital Status: Married    Spouse Name: N/A  . Number of Children: 2  . Years of Education: 12   Occupational History  . Not on file.   Social History Main Topics  . Smoking status: Current Every Day Smoker -- 0.50 packs/day for 0  years    Types: Cigarettes  . Smokeless tobacco: Never Used     Comment: 1-2 cigarettes daily for years  . Alcohol Use: 0.6 oz/week    1 Glasses of wine per week     Comment: occasionaly   . Drug Use: No  . Sexual Activity: No   Other Topics Concern  . Not on file   Social History Narrative   Patient is married with 2 children.   Patient is right handed.   Patient has hs education.   Patient  drinks 4 cups daily.    Medications:   Current Outpatient Prescriptions on File Prior to Visit  Medication Sig Dispense Refill  . amLODipine (NORVASC) 5 MG tablet Take 1 tablet (5 mg total) by mouth daily. 30 tablet 2  . aspirin 325 MG tablet Take 1 tablet (325 mg total) by mouth daily. 30 tablet 3  . atorvastatin (LIPITOR) 10 MG tablet Take 1 tablet (10 mg total) by mouth daily at 6 PM. 30 tablet 2  . cetirizine (ZYRTEC) 10 MG tablet Take 10 mg by mouth daily as needed for allergies.    . fluticasone (FLONASE) 50 MCG/ACT nasal spray Place 2 sprays into both nostrils daily as needed for allergies or rhinitis.    Marland Kitchen gabapentin (NEURONTIN) 100 MG capsule Take 1-2 capsules (100-200 mg total) by mouth 2 (two) times daily. 90 capsule 3  . HYDROcodone-acetaminophen (NORCO) 7.5-325 MG per tablet Take 1 tablet by mouth every 8 (eight) hours as needed for moderate pain. 75 tablet 0  . Hypromellose (ARTIFICIAL TEARS OP) Place 1 drop into the right eye daily as needed (for dry eyes).    . meloxicam (MOBIC) 15 MG tablet Take 15 mg by mouth daily as needed for pain.     . Multiple Vitamins-Minerals (WOMENS ONE DAILY PO) Take 1 tablet by mouth daily.    . prednisoLONE acetate (PRED FORTE) 1 % ophthalmic suspension Place 1 drop into the left eye as needed (flare ups).     . triamcinolone cream (KENALOG) 0.1 % Apply 1 application topically at bedtime as needed (for rash). 30 g 0  . valsartan-hydrochlorothiazide (DIOVAN-HCT) 160-12.5 MG per tablet Take 1 tablet by mouth daily.  4   No current facility-administered medications on file prior to visit.    Allergies:  No Known Allergies  Physical Exam General: frail middle-aged Caucasian lady, seated, in no evident distress Head: head normocephalic and atraumatic.  Neck: supple with no carotid or supraclavicular bruits Cardiovascular: regular rate and rhythm, no murmurs Musculoskeletal: no deformity Skin:  no rash/petichiae Vascular:  Normal pulses all  extremities Filed Vitals:   06/14/15 1400  BP: 115/76  Pulse: 88   Neurologic Exam Mental Status: Awake and fully alert. Oriented to place and time. Recent and remote memory intact. Attention span, concentration and fund of knowledge appropriate. Mood and affect appropriate.  Cranial Nerves: Fundoscopic exam not done  Pupils equal, briskly reactive to light. Extraocular movements full without nystagmus. Visual fields full to confrontation. Hearing intact. Facial sensation intact. Face, tongue, palate moves normally and symmetrically.  Motor: Normal bulk and tone. Normal strength in all tested extremity muscles. Sensory.: intact to touch ,pinprick .position and vibratory sensation. Mild subjective diminished sensation on the left cheeks and fingertips only Coordination: Rapid alternating movements normal in all extremities. Finger-to-nose and heel-to-shin performed accurately bilaterally. Gait and Station: Arises from chair without difficulty. Stance is normal. Gait demonstrates normal stride length and balance . Able to heel, toe and tandem  walk without difficulty.  Reflexes: 1+ and symmetric. Toes downgoing.   NIHSS  1 Modified Rankin  1   ASSESSMENT: 59 year old Caucasian lady with right lateral thalamic/internal capsule lacunar infarct secondary to small vessel disease in September 2015 with vascular risk factors of hypertension, hyperlipidemia, cigarette smoking and marijuana use.She has done well but has mild residual paresthesias    PLAN:  I had a long d/w patient about her remote stroke, risk for recurrent stroke/TIAs, personally independently reviewed imaging studies and stroke evaluation results and answered questions.Continue aspirin 81 mg orally every day  for secondary stroke prevention and maintain strict control of hypertension with blood pressure goal below 130/90, diabetes with hemoglobin A1c goal below 6.5% and lipids with LDL cholesterol goal below 100 mg/dL. I also advised  the patient to eat a healthy diet with plenty of whole grains, cereals, fruits and vegetables, exercise regularly and maintain ideal body weight. Continue gabapentin for post stroke paresthesias. Followup in the future with me in  1 year or call earlier if needed   Antony Contras, MD Note: This document was prepared with digital dictation and possible smart phrase technology. Any transcriptional errors that result from this process are unintentional

## 2015-06-26 ENCOUNTER — Encounter: Payer: Medicaid Other | Attending: Physical Medicine & Rehabilitation | Admitting: Registered Nurse

## 2015-06-26 ENCOUNTER — Encounter: Payer: Self-pay | Admitting: Registered Nurse

## 2015-06-26 VITALS — BP 113/78 | HR 87

## 2015-06-26 DIAGNOSIS — Z5181 Encounter for therapeutic drug level monitoring: Secondary | ICD-10-CM

## 2015-06-26 DIAGNOSIS — M5136 Other intervertebral disc degeneration, lumbar region: Secondary | ICD-10-CM | POA: Diagnosis not present

## 2015-06-26 DIAGNOSIS — H53462 Homonymous bilateral field defects, left side: Secondary | ICD-10-CM | POA: Insufficient documentation

## 2015-06-26 DIAGNOSIS — M129 Arthropathy, unspecified: Secondary | ICD-10-CM | POA: Insufficient documentation

## 2015-06-26 DIAGNOSIS — M47816 Spondylosis without myelopathy or radiculopathy, lumbar region: Secondary | ICD-10-CM | POA: Diagnosis not present

## 2015-06-26 DIAGNOSIS — M25551 Pain in right hip: Secondary | ICD-10-CM | POA: Diagnosis not present

## 2015-06-26 DIAGNOSIS — M545 Low back pain: Secondary | ICD-10-CM | POA: Insufficient documentation

## 2015-06-26 DIAGNOSIS — I69398 Other sequelae of cerebral infarction: Secondary | ICD-10-CM | POA: Diagnosis not present

## 2015-06-26 DIAGNOSIS — M479 Spondylosis, unspecified: Secondary | ICD-10-CM | POA: Diagnosis not present

## 2015-06-26 DIAGNOSIS — M25561 Pain in right knee: Secondary | ICD-10-CM | POA: Diagnosis not present

## 2015-06-26 DIAGNOSIS — Z79899 Other long term (current) drug therapy: Secondary | ICD-10-CM

## 2015-06-26 MED ORDER — DICLOFENAC SODIUM 1 % TD GEL: 2.0000 g | Freq: Four times a day (QID) | TRANSDERMAL | Status: DC

## 2015-06-26 MED ORDER — HYDROCODONE-ACETAMINOPHEN 7.5-325 MG PO TABS: 1.0000 | ORAL_TABLET | Freq: Three times a day (TID) | ORAL | Status: DC | PRN

## 2015-06-26 NOTE — Progress Notes (Signed)
Subjective:    Patient ID: Brittany Crawford, female    DOB: 02/09/1956, 59 y.o.   MRN: [id number]  HPI: [name] is a 59 year old female who returns for follow up for chronic pain and medication refill. She says her pain is located in her right groin and right knee. Also states she has left hand tingling and numbness at times. She rates her pain 3. Her current exercise regime is walking three times a week around the track and performing stretching exercises.   She seen her Rheumatologist Dr. Lenna Gilford.on 06/25/2015 following up with her Sarcoid and Arthritis.    Pain Inventory Average Pain 5 Pain Right Now 3 My pain is sharp, dull, stabbing, tingling and aching  In the last 24 hours, has pain interfered with the following? General activity 5 Relation with others 10 Enjoyment of life 9 What TIME of day is your pain at its worst? morning,evening Sleep (in general) Good  Pain is worse with: bending, standing and some activites Pain improves with: rest, heat/ice, therapy/exercise and medication Relief from Meds: 8  Mobility walk without assistance walk with assistance use a cane ability to climb steps?  yes do you drive?  no transfers alone Do you have any goals in this area?  yes  Function disabled: date disabled na I need assistance with the following:  household duties Do you have any goals in this area?  yes  Neuro/Psych No problems in this area  Prior Studies x-rays  Physicians involved in your care Any changes since last visit?  yes Rheumatologist na   Family History  Problem Relation Age of Onset  . Colon cancer Maternal Uncle   . Stroke Mother    Social History   Social History  . Marital Status: Married    Spouse Name: N/A  . Number of Children: 2  . Years of Education: 12   Social History Main Topics  . Smoking status: Current Every Day Smoker -- 0.50 packs/day for 0 years    Types: Cigarettes  . Smokeless tobacco: Never Used   Comment: 1-2 cigarettes daily for years  . Alcohol Use: 0.6 oz/week    1 Glasses of wine per week     Comment: occasionaly   . Drug Use: No  . Sexual Activity: No   Other Topics Concern  . Not on file   Social History Narrative   Patient is married with 2 children.   Patient is right handed.   Patient has hs education.   Patient drinks 4 cups daily.   Past Surgical History  Procedure Laterality Date  . Foot surgery      left-pins placed  . Lymph node biopsy    . Dilatation & currettage/hysteroscopy with resectocope N/A 12/10/2012    Procedure: DILATATION & CURETTAGE/HYSTEROSCOPY WITH RESECTOCOPE;  Surgeon: Marvene Staff, MD;  Location: [facility];  Service: Gynecology;  Laterality: N/A;  . Polypectomy N/A 12/10/2012    Procedure: POLYPECTOMY;  Surgeon: Marvene Staff, MD;  Location: [facility];  Service: Gynecology;  Laterality: N/A;   Past Medical History  Diagnosis Date  . H/O sarcoidosis   . Hypertension   . Seasonal allergies   . Chronic back pain   . Stroke    LMP 11/13/2012  Opioid Risk Score:   Fall Risk Score:  `1  Depression screen PHQ 2/9  Depression screen [facility] 2/9 06/26/2015 05/15/2015 01/02/2015  Decreased Interest 0 0 0  Down, Depressed, Hopeless 0 0 0  PHQ - 2 Score 0 0 0  Altered sleeping - - 0  Tired, decreased energy - - 0  Change in appetite - - 0  Feeling bad or failure about yourself  - - 0  Trouble concentrating - - 0  Moving slowly or fidgety/restless - - 1  Suicidal thoughts - - 0  PHQ-9 Score - - 1     Review of Systems  All other systems reviewed and are negative.      Objective:   Physical Exam  Constitutional: She is oriented to person, place, and time. She appears well-developed and well-nourished.  HENT:  Head: Normocephalic and atraumatic.  Neck: Normal range of motion. Neck supple.  Cardiovascular: Normal rate and regular rhythm.   Pulmonary/Chest: Effort normal and breath sounds normal.  Musculoskeletal:  Normal  Muscle Bulk and Muscle Testing Reveals: Upper Extremities: Full ROM and Muscle Strength 5/5 Back without spinal or paraspinal tenderness Lower Extremities: Full ROM and Muscle Strength 5/5 Arises from chair with ease Narrow Based Gait   Neurological: She is alert and oriented to person, place, and time.  Skin: Skin is warm and dry.  Psychiatric: She has a normal mood and affect.  Nursing note and vitals reviewed.         Assessment & Plan:  1. Lumbar spondylosis with DDD and facet arthropathy Refilled: Hydrocodone 7.5 /325 mg one tablet every 8 hours as needed for pain #75.  2. Right knee pain: Continue with heat/ice and exercise. Voltaren Gel sample given/ Alternate with Mobic 3. Right thalamic/internal capsule lacunar infarct with persistent left hemisensory deficits: Continue to Monitor  20 minutes of face to face patient care time was spent during this visit. All questions were encouraged and answered.  F/U in 1 month

## 2015-07-03 ENCOUNTER — Telehealth: Payer: Self-pay | Admitting: Physical Medicine & Rehabilitation

## 2015-07-12 ENCOUNTER — Emergency Department (INDEPENDENT_AMBULATORY_CARE_PROVIDER_SITE_OTHER): Payer: Medicaid Other

## 2015-07-12 ENCOUNTER — Emergency Department (INDEPENDENT_AMBULATORY_CARE_PROVIDER_SITE_OTHER)
Admission: EM | Admit: 2015-07-12 | Discharge: 2015-07-12 | Disposition: A | Discharge status: Home / Self Care | Payer: Medicaid Other | Source: Home / Self Care | Attending: Family Medicine | Admitting: Family Medicine

## 2015-07-12 ENCOUNTER — Encounter (HOSPITAL_COMMUNITY): Payer: Self-pay | Admitting: Emergency Medicine

## 2015-07-12 DIAGNOSIS — M542 Cervicalgia: Secondary | ICD-10-CM

## 2015-07-12 NOTE — Discharge Instructions (Signed)
RICE: Routine Care for Injuries The routine care of many injuries includes Rest, Ice, Compression, and Elevation (RICE). HOME CARE INSTRUCTIONS  Rest is needed to allow your body to heal. Routine activities can usually be resumed when comfortable. Injured tendons and bones can take up to 6 weeks to heal. Tendons are the cord-like structures that attach muscle to bone.  Ice following an injury helps keep the swelling down and reduces pain.  Put ice in a plastic bag.  Place a towel between your skin and the bag.  Leave the ice on for 15-20 minutes, 3-4 times a day, or as directed by your health care provider. Do this while awake, for the first 24 to 48 hours. After that, continue as directed by your caregiver.  Compression helps keep swelling down. It also gives support and helps with discomfort. If an elastic bandage has been applied, it should be removed and reapplied every 3 to 4 hours. It should not be applied tightly, but firmly enough to keep swelling down. Watch fingers or toes for swelling, bluish discoloration, coldness, numbness, or excessive pain. If any of these problems occur, remove the bandage and reapply loosely. Contact your caregiver if these problems continue.  Elevation helps reduce swelling and decreases pain. With extremities, such as the arms, hands, legs, and feet, the injured area should be placed near or above the level of the heart, if possible. SEEK IMMEDIATE MEDICAL CARE IF:  You have persistent pain and swelling.  You develop redness, numbness, or unexpected weakness.  Your symptoms are getting worse rather than improving after several days. These symptoms may indicate that further evaluation or further X-rays are needed. Sometimes, X-rays may not show a small broken bone (fracture) until 1 week or 10 days later. Make a follow-up appointment with your caregiver. Ask when your X-ray results will be ready. Make sure you get your X-ray results. Document Released:  01/11/2001 Document Revised: 10/04/2013 Document Reviewed: 02/28/2011 Exit[employer] Patient Information 2015 [employer]. This information is not intended to replace advice given to you by your health care provider. Make sure you discuss any questions you have with your health care provider.  
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
## 2015-07-16 NOTE — Telephone Encounter (Signed)
I return Ms. Cravey called, she mis-placed her prescription. The [facility] Controlled Substance reporting System checked. Her last Hydrocodone prescription was picked up on 06/16/15. She will check her home again. Will call office on 07/18/2015 if she hasn't located the prescription. At that time new prescription will be printed, she verbalizes understanding.

## 2015-07-16 NOTE — Telephone Encounter (Signed)
Patient called to let us know that she has misplaced her prescription for oxycodone that Zella Ball gave her--wants to know if she can get a new one.  Please call her at [phone].

## 2015-07-17 ENCOUNTER — Telehealth: Payer: Self-pay | Admitting: Physical Medicine & Rehabilitation

## 2015-07-17 MED ORDER — HYDROCODONE-ACETAMINOPHEN 7.5-325 MG PO TABS: 1.0000 | ORAL_TABLET | Freq: Three times a day (TID) | ORAL | Status: DC | PRN

## 2015-07-17 NOTE — Telephone Encounter (Signed)
Spoke to Brittany Crawford. New prescription printed, she verbalizes understanding. [facility] reviewed last prescription filles 06/16/15.

## 2015-07-17 NOTE — Telephone Encounter (Signed)
Patient would like Brittany Crawford to call her--can't find her prescription

## 2015-07-24 ENCOUNTER — Ambulatory Visit: Payer: Medicaid Other | Admitting: Physical Medicine & Rehabilitation

## 2015-08-03 ENCOUNTER — Encounter: Payer: Self-pay | Admitting: Registered Nurse

## 2015-08-03 ENCOUNTER — Other Ambulatory Visit: Payer: Self-pay | Admitting: Registered Nurse

## 2015-08-03 ENCOUNTER — Encounter: Payer: Medicaid Other | Attending: Physical Medicine & Rehabilitation | Admitting: Registered Nurse

## 2015-08-03 VITALS — BP 140/83 | HR 93 | Resp 16

## 2015-08-03 DIAGNOSIS — M47816 Spondylosis without myelopathy or radiculopathy, lumbar region: Secondary | ICD-10-CM

## 2015-08-03 DIAGNOSIS — I69398 Other sequelae of cerebral infarction: Secondary | ICD-10-CM | POA: Diagnosis not present

## 2015-08-03 DIAGNOSIS — M5136 Other intervertebral disc degeneration, lumbar region: Secondary | ICD-10-CM | POA: Insufficient documentation

## 2015-08-03 DIAGNOSIS — M545 Low back pain: Secondary | ICD-10-CM | POA: Diagnosis not present

## 2015-08-03 DIAGNOSIS — M479 Spondylosis, unspecified: Secondary | ICD-10-CM | POA: Diagnosis not present

## 2015-08-03 DIAGNOSIS — G894 Chronic pain syndrome: Secondary | ICD-10-CM

## 2015-08-03 DIAGNOSIS — H53462 Homonymous bilateral field defects, left side: Secondary | ICD-10-CM | POA: Diagnosis not present

## 2015-08-03 DIAGNOSIS — M129 Arthropathy, unspecified: Secondary | ICD-10-CM | POA: Insufficient documentation

## 2015-08-03 DIAGNOSIS — M25551 Pain in right hip: Secondary | ICD-10-CM | POA: Diagnosis not present

## 2015-08-03 DIAGNOSIS — Z5181 Encounter for therapeutic drug level monitoring: Secondary | ICD-10-CM

## 2015-08-03 DIAGNOSIS — M25561 Pain in right knee: Secondary | ICD-10-CM | POA: Diagnosis not present

## 2015-08-03 DIAGNOSIS — Z79899 Other long term (current) drug therapy: Secondary | ICD-10-CM

## 2015-08-03 MED ORDER — HYDROCODONE-ACETAMINOPHEN 7.5-325 MG PO TABS: 1.0000 | ORAL_TABLET | Freq: Three times a day (TID) | ORAL | Status: DC | PRN

## 2015-08-03 NOTE — Progress Notes (Signed)
Subjective:    Patient ID: Brittany Crawford, female    DOB: Apr 11, 1956, 59 y.o.   MRN: [id number]  HPI: Brittany Crawford is a 59 year old female who returns for follow up for chronic pain and medication refill. She says her pain is located in her lower back, right hip and right knee. She rates her pain 8. Her current exercise regime is  using her Nordi Track three times a week and performing stretching exercises.   Pain Inventory Average Pain 10 Pain Right Now 8 My pain is NA  In the last 24 hours, has pain interfered with the following? General activity 7 Relation with others 10 Enjoyment of life 10 What TIME of day is your pain at its worst? Morning, Evening and Night Sleep (in general) NA  Pain is worse with: bending and standing Pain improves with: NA Relief from Meds: 10  Mobility do you drive?  no Do you have any goals in this area?  no  Function Do you have any goals in this area?  no  Neuro/Psych tingling  Prior Studies Any changes since last visit?  no  Physicians involved in your care Any changes since last visit?  yes   Family History  Problem Relation Age of Onset  . Colon cancer Maternal Uncle   . Stroke Mother    Social History   Social History  . Marital Status: Married    Spouse Name: N/A  . Number of Children: 2  . Years of Education: 12   Social History Main Topics  . Smoking status: Current Every Day Smoker -- 0.50 packs/day for 0 years    Types: Cigarettes  . Smokeless tobacco: Never Used     Comment: 1-2 cigarettes daily for years  . Alcohol Use: 0.6 oz/week    1 Glasses of wine per week     Comment: occasionaly   . Drug Use: No  . Sexual Activity: No   Other Topics Concern  . None   Social History Narrative   Patient is married with 2 children.   Patient is right handed.   Patient has hs education.   Patient drinks 4 cups daily.   Past Surgical History  Procedure Laterality Date  . Foot surgery      left-pins  placed  . Lymph node biopsy    . Dilatation & currettage/hysteroscopy with resectocope N/A 12/10/2012    Procedure: DILATATION & CURETTAGE/HYSTEROSCOPY WITH RESECTOCOPE;  Surgeon: Marvene Staff, MD;  Location: [facility];  Service: Gynecology;  Laterality: N/A;  . Polypectomy N/A 12/10/2012    Procedure: POLYPECTOMY;  Surgeon: Marvene Staff, MD;  Location: [facility];  Service: Gynecology;  Laterality: N/A;   Past Medical History  Diagnosis Date  . H/O sarcoidosis   . Hypertension   . Seasonal allergies   . Chronic back pain   . Stroke ([place])    BP 140/83 mmHg  Pulse 93  Resp 16  SpO2 97%  LMP 11/13/2012  Opioid Risk Score:   Fall Risk Score:  `1  Depression screen PHQ 2/9  Depression screen [facility] 2/9 06/26/2015 05/15/2015 01/02/2015  Decreased Interest 0 0 0  Down, Depressed, Hopeless 0 0 0  PHQ - 2 Score 0 0 0  Altered sleeping - - 0  Tired, decreased energy - - 0  Change in appetite - - 0  Feeling bad or failure about yourself  - - 0  Trouble concentrating - - 0  Moving slowly or  fidgety/restless - - 1  Suicidal thoughts - - 0  PHQ-9 Score - - 1      Review of Systems  Constitutional: Positive for unexpected weight change.  Neurological:       Tingling       Objective:   Physical Exam  Constitutional: She is oriented to person, place, and time. She appears well-developed and well-nourished.  HENT:  Head: Normocephalic and atraumatic.  Neck: Normal range of motion. Neck supple.  Cardiovascular: Normal rate and regular rhythm.   Pulmonary/Chest: Effort normal and breath sounds normal.  Musculoskeletal:  Normal Muscle Bulk and Muscle Testing Reveals: Upper Extremities: Full ROM and Muscle Strength 5/5 Back without spinal or paraspinal tenderness Lower Extremities: Full ROM and Muscle Strength 5/5 Arises from chair with ease Narrow Based gait  Neurological: She is alert and oriented to person, place, and time.  Skin: Skin is warm and dry.  Psychiatric:  She has a normal mood and affect.  Nursing note and vitals reviewed.         Assessment & Plan:  1. Lumbar spondylosis with DDD and facet arthropathy Refilled: Hydrocodone 7.5 /325 mg one tablet every 8 hours as needed for pain #75.  2. Right knee pain: Continue with heat/ice, exercise and Voltaren Gel. Alternate with Mobic 3. Right thalamic/internal capsule lacunar infarct with persistent left hemisensory deficits: Continue to Monitor  20 minutes of face to face patient care time was spent during this visit. All questions were encouraged and answered.  F/U in 1 month

## 2015-08-04 LAB — PMP ALCOHOL METABOLITE (ETG): Ethyl Glucuronide (EtG): NEGATIVE ng/mL

## 2015-08-08 LAB — OPIATES/OPIOIDS (LC/MS-MS)
Codeine Urine: NEGATIVE ng/mL (ref <50)
HYDROMORPHONE: 137 ng/mL (ref <50)
Hydrocodone: 1079 ng/mL (ref <50)
MORPHINE: NEGATIVE ng/mL (ref <50)
NORHYDROCODONE, UR: 674 ng/mL (ref <50)
NOROXYCODONE, UR: NEGATIVE ng/mL (ref <50)
OXYCODONE, UR: NEGATIVE ng/mL (ref <50)
Oxymorphone: NEGATIVE ng/mL (ref <50)

## 2015-08-09 ENCOUNTER — Other Ambulatory Visit: Payer: Self-pay

## 2015-08-09 DIAGNOSIS — Z1231 Encounter for screening mammogram for malignant neoplasm of breast: Secondary | ICD-10-CM

## 2015-08-09 LAB — PRESCRIPTION MONITORING PROFILE (SOLSTAS)
AMPHETAMINE/METH: NEGATIVE ng/mL
BARBITURATE SCREEN, URINE: NEGATIVE ng/mL
BENZODIAZEPINE SCREEN, URINE: NEGATIVE ng/mL
Buprenorphine, Urine: NEGATIVE ng/mL
CARISOPRODOL, URINE: NEGATIVE ng/mL
CREATININE, URINE: 56.76 mg/dL (ref >20.0)
Cannabinoid Scrn, Ur: NEGATIVE ng/mL
Cocaine Metabolites: NEGATIVE ng/mL
FENTANYL URINE: NEGATIVE ng/mL
MDMA URINE: NEGATIVE ng/mL
MEPERIDINE UR: NEGATIVE ng/mL
Methadone Screen, Urine: NEGATIVE ng/mL
Nitrites, Initial: NEGATIVE ug/mL
OXYCODONE SCRN UR: NEGATIVE ng/mL
PROPOXYPHENE: NEGATIVE ng/mL
TRAMADOL UR: NEGATIVE ng/mL
Tapentadol, urine: NEGATIVE ng/mL
Zolpidem, Urine: NEGATIVE ng/mL
pH, Initial: 4.9 pH (ref 4.5–8.9)

## 2015-08-17 ENCOUNTER — Encounter: Payer: Self-pay | Admitting: *Deleted

## 2015-08-23 NOTE — Progress Notes (Signed)
Urine drug screen for this encounter is consistent for prescribed medication 

## 2015-09-05 ENCOUNTER — Encounter: Payer: Self-pay | Admitting: Registered Nurse

## 2015-09-05 ENCOUNTER — Encounter: Payer: Medicaid Other | Attending: Physical Medicine & Rehabilitation | Admitting: Registered Nurse

## 2015-09-05 VITALS — BP 138/90 | HR 78 | Resp 14

## 2015-09-05 DIAGNOSIS — M479 Spondylosis, unspecified: Secondary | ICD-10-CM | POA: Diagnosis not present

## 2015-09-05 DIAGNOSIS — H53462 Homonymous bilateral field defects, left side: Secondary | ICD-10-CM | POA: Insufficient documentation

## 2015-09-05 DIAGNOSIS — M47816 Spondylosis without myelopathy or radiculopathy, lumbar region: Secondary | ICD-10-CM

## 2015-09-05 DIAGNOSIS — M25551 Pain in right hip: Secondary | ICD-10-CM

## 2015-09-05 DIAGNOSIS — M129 Arthropathy, unspecified: Secondary | ICD-10-CM | POA: Diagnosis not present

## 2015-09-05 DIAGNOSIS — I69398 Other sequelae of cerebral infarction: Secondary | ICD-10-CM | POA: Insufficient documentation

## 2015-09-05 DIAGNOSIS — M545 Low back pain: Secondary | ICD-10-CM | POA: Diagnosis not present

## 2015-09-05 DIAGNOSIS — M5136 Other intervertebral disc degeneration, lumbar region: Secondary | ICD-10-CM | POA: Insufficient documentation

## 2015-09-05 DIAGNOSIS — Z79899 Other long term (current) drug therapy: Secondary | ICD-10-CM

## 2015-09-05 DIAGNOSIS — M25561 Pain in right knee: Secondary | ICD-10-CM | POA: Insufficient documentation

## 2015-09-05 DIAGNOSIS — G894 Chronic pain syndrome: Secondary | ICD-10-CM

## 2015-09-05 DIAGNOSIS — M25562 Pain in left knee: Secondary | ICD-10-CM

## 2015-09-05 DIAGNOSIS — Z5181 Encounter for therapeutic drug level monitoring: Secondary | ICD-10-CM

## 2015-09-05 MED ORDER — GABAPENTIN 100 MG PO CAPS: ORAL_CAPSULE | ORAL | Status: DC

## 2015-09-05 MED ORDER — HYDROCODONE-ACETAMINOPHEN 7.5-325 MG PO TABS: 1.0000 | ORAL_TABLET | Freq: Three times a day (TID) | ORAL | Status: DC | PRN

## 2015-09-05 NOTE — Progress Notes (Signed)
Subjective:    Patient ID: Brittany Crawford, female    DOB: 04/27/1956, 59 y.o.   MRN: [id number]  HPI: [name] is a 59 year old female who returns for follow up for chronic pain and medication refill. She says her pain is located in her lower back. She rates her pain 5. Her current exercise regime is using her Nordi Track three times a week and performing stretching exercises.   Pain Inventory Average Pain 8 Pain Right Now 5 My pain is dull, tingling and aching  In the last 24 hours, has pain interfered with the following? General activity 7 Relation with others 10 Enjoyment of life 10 What TIME of day is your pain at its worst? morning, evening  Sleep (in general) Good  Pain is worse with: bending and some activites Pain improves with: rest, heat/ice and medication Relief from Meds: 7  Mobility walk without assistance do you drive?  no Do you have any goals in this area?  yes  Function disabled: date disabled .  Neuro/Psych trouble walking  Prior Studies Any changes since last visit?  no  Physicians involved in your care Any changes since last visit?  no   Family History  Problem Relation Age of Onset  . Colon cancer Maternal Uncle   . Stroke Mother    Social History   Social History  . Marital Status: Married    Spouse Name: N/A  . Number of Children: 2  . Years of Education: 12   Social History Main Topics  . Smoking status: Current Every Day Smoker -- 0.50 packs/day for 0 years    Types: Cigarettes  . Smokeless tobacco: Never Used     Comment: 1-2 cigarettes daily for years  . Alcohol Use: 0.6 oz/week    1 Glasses of wine per week     Comment: occasionaly   . Drug Use: No  . Sexual Activity: No   Other Topics Concern  . None   Social History Narrative   Patient is married with 2 children.   Patient is right handed.   Patient has hs education.   Patient drinks 4 cups daily.   Past Surgical History  Procedure Laterality  Date  . Foot surgery      left-pins placed  . Lymph node biopsy    . Dilatation & currettage/hysteroscopy with resectocope N/A 12/10/2012    Procedure: DILATATION & CURETTAGE/HYSTEROSCOPY WITH RESECTOCOPE;  Surgeon: Marvene Staff, MD;  Location: [facility];  Service: Gynecology;  Laterality: N/A;  . Polypectomy N/A 12/10/2012    Procedure: POLYPECTOMY;  Surgeon: Marvene Staff, MD;  Location: [facility];  Service: Gynecology;  Laterality: N/A;   Past Medical History  Diagnosis Date  . H/O sarcoidosis   . Hypertension   . Seasonal allergies   . Chronic back pain   . Stroke ([facility])    BP 138/90 mmHg  Pulse 78  Resp 14  SpO2 97%  LMP 11/13/2012  Opioid Risk Score:   Fall Risk Score:  `1  Depression screen PHQ 2/9  Depression screen [facility] 2/9 06/26/2015 05/15/2015 01/02/2015  Decreased Interest 0 0 0  Down, Depressed, Hopeless 0 0 0  PHQ - 2 Score 0 0 0  Altered sleeping - - 0  Tired, decreased energy - - 0  Change in appetite - - 0  Feeling bad or failure about yourself  - - 0  Trouble concentrating - - 0  Moving slowly or fidgety/restless - -  1  Suicidal thoughts - - 0  PHQ-9 Score - - 1     Review of Systems  All other systems reviewed and are negative.      Objective:   Physical Exam  Constitutional: She is oriented to person, place, and time. She appears well-developed and well-nourished.  HENT:  Head: Normocephalic and atraumatic.  Neck: Normal range of motion. Neck supple.  Cardiovascular: Normal rate and regular rhythm.   Pulmonary/Chest: Effort normal and breath sounds normal.  Musculoskeletal:  Normal Muscle Bulk and Muscle Testing Reveals: Upper Extremities: Full ROM and Muscle Strength 5/5 Lumbar Paraspinal Tenderness: L-4-L-5 Lower Extremities: Full ROM and Muscle Strength 5/5 Bilateral Lower Extremities Flexion Produces Pain into Lumbar Arises from chair with ease Narrow Based Gait  Neurological: She is alert and oriented to person, place, and  time.  Skin: Skin is warm and dry.  Psychiatric: She has a normal mood and affect.  Nursing note and vitals reviewed.         Assessment & Plan:  1. Lumbar spondylosis with DDD and facet arthropathy Refilled: Hydrocodone 7.5 /325 mg one tablet every 8 hours as needed for pain #75.  2. Right knee pain: Continue with heat/ice, exercise and Voltaren Gel. Alternate with Mobic 3. Right thalamic/internal capsule lacunar infarct with persistent left hemisensory deficits: Continue to Monitor  20 minutes of face to face patient care time was spent during this visit. All questions were encouraged and answered.  F/U in 1 month

## 2015-09-13 ENCOUNTER — Ambulatory Visit (INDEPENDENT_AMBULATORY_CARE_PROVIDER_SITE_OTHER): Payer: Medicaid Other | Admitting: Obstetrics & Gynecology

## 2015-09-13 ENCOUNTER — Encounter: Payer: Self-pay | Admitting: Obstetrics & Gynecology

## 2015-09-13 ENCOUNTER — Other Ambulatory Visit (HOSPITAL_COMMUNITY)
Admission: RE | Admit: 2015-09-13 | Discharge: 2015-09-13 | Disposition: A | Discharge status: Home / Self Care | Payer: Medicaid Other | Source: Ambulatory Visit | Attending: Obstetrics & Gynecology | Admitting: Obstetrics & Gynecology

## 2015-09-13 VITALS — BP 142/82 | HR 86 | Ht 67.5 in | Wt 199.6 lb

## 2015-09-13 DIAGNOSIS — R102 Pelvic and perineal pain: Secondary | ICD-10-CM

## 2015-09-13 DIAGNOSIS — Z113 Encounter for screening for infections with a predominantly sexual mode of transmission: Secondary | ICD-10-CM | POA: Diagnosis not present

## 2015-09-13 LAB — WET PREP, GENITAL
TRICH WET PREP: NONE SEEN
Yeast Wet Prep HPF POC: NONE SEEN

## 2015-09-13 NOTE — Progress Notes (Signed)
Patient ID: Brittany Crawford, female   DOB: 16-Jan-1956, 59 y.o.   MRN: [id number]  Chief Complaint  Patient presents with  . Pelvic Pain    feels like menstrual cramps, no bleeding.     HPI Brittany Crawford is a 59 y.o. female.  [id number] Patient's last menstrual period was 11/13/2012. Had hysteroscopy and D&C with benign polyp 11/2012 with Dr Garwin Brothers, referred due to pelvic pain like cramps but no bleeding and dyspareunia.  HPI  Past Medical History  Diagnosis Date  . H/O sarcoidosis   . Hypertension   . Seasonal allergies   . Chronic back pain   . Stroke [facility])     Past Surgical History  Procedure Laterality Date  . Foot surgery      left-pins placed  . Lymph node biopsy    . Dilatation & currettage/hysteroscopy with resectocope N/A 12/10/2012    Procedure: DILATATION & CURETTAGE/HYSTEROSCOPY WITH RESECTOCOPE;  Surgeon: Marvene Staff, MD;  Location: [facility];  Service: Gynecology;  Laterality: N/A;  . Polypectomy N/A 12/10/2012    Procedure: POLYPECTOMY;  Surgeon: Marvene Staff, MD;  Location: [facility];  Service: Gynecology;  Laterality: N/A;    Family History  Problem Relation Age of Onset  . Colon cancer Maternal Uncle   . Stroke Mother     Social History Social History  Substance Use Topics  . Smoking status: Current Every Day Smoker -- 0.50 packs/day for 0 years    Types: Cigarettes  . Smokeless tobacco: Never Used     Comment: 1-2 cigarettes daily for years  . Alcohol Use: 0.6 oz/week    1 Glasses of wine per week     Comment: occasionaly     No Known Allergies  Current Outpatient Prescriptions  Medication Sig Dispense Refill  . amLODipine (NORVASC) 5 MG tablet Take 1 tablet (5 mg total) by mouth daily. 30 tablet 2  . aspirin 325 MG tablet Take 1 tablet (325 mg total) by mouth daily. 30 tablet 3  . atorvastatin (LIPITOR) 10 MG tablet Take 1 tablet (10 mg total) by mouth daily at 6 PM. 30 tablet 2  . cetirizine (ZYRTEC) 10 MG tablet Take 10 mg by  mouth daily as needed for allergies.    Marland Kitchen diclofenac sodium (VOLTAREN) 1 % GEL Apply 2 g topically QID. Use as directed on right  hip and  Right knee 3 Tube 4  . fluticasone (FLONASE) 50 MCG/ACT nasal spray Place 2 sprays into both nostrils daily as needed for allergies or rhinitis.    Marland Kitchen gabapentin (NEURONTIN) 100 MG capsule Take one capsule in the Morning and One Capsule in the Evening and Two Capsules at Bedtime 120 capsule 3  . HYDROcodone-acetaminophen (NORCO) 7.5-325 MG tablet Take 1 tablet by mouth every 8 (eight) hours as needed for moderate pain. 75 tablet 0  . meloxicam (MOBIC) 15 MG tablet Take 15 mg by mouth daily as needed for pain.     . prednisoLONE acetate (PRED FORTE) 1 % ophthalmic suspension Place 1 drop into the left eye as needed (flare ups).     . valsartan-hydrochlorothiazide (DIOVAN-HCT) 160-12.5 MG per tablet Take 1 tablet by mouth daily.  4  . Hypromellose (ARTIFICIAL TEARS OP) Place 1 drop into the right eye daily as needed (for dry eyes).    . Multiple Vitamins-Minerals (WOMENS ONE DAILY PO) Take 1 tablet by mouth daily.    Marland Kitchen triamcinolone cream (KENALOG) 0.1 % Apply 1 application topically at bedtime as needed (  for rash). (Patient not taking: Reported on 09/13/2015) 30 g 0   No current facility-administered medications for this visit.    Review of Systems Review of Systems  Genitourinary: Positive for vaginal discharge, vaginal pain, pelvic pain and dyspareunia. Negative for dysuria, frequency, hematuria and menstrual problem.  Musculoskeletal: Positive for back pain.    Blood pressure 142/82, pulse 86, height 5' 7.5" (1.715 m), weight 199 lb 9.6 oz (90.538 kg), last menstrual period 11/13/2012.  Physical Exam Physical Exam  Constitutional: She is oriented to person, place, and time. She appears well-developed. No distress.  Abdominal: Soft. There is no tenderness.  Genitourinary: Uterus normal. Vaginal discharge found.  No mass or tenderness  Neurological: She  is alert and oriented to person, place, and time.  Skin: Skin is warm and dry.  Psychiatric: She has a normal mood and affect. Her behavior is normal.    Data Reviewed Op notes, pathology, [place]  Assessment    Pelvic pain and dyspareunia     Plan    Records from [facility] Pelvic US RTC 4 weeks        Lukus Binion 09/13/2015, 2:20 PM

## 2015-09-13 NOTE — Patient Instructions (Signed)

## 2015-09-14 ENCOUNTER — Ambulatory Visit: Payer: Medicaid Other

## 2015-09-17 ENCOUNTER — Encounter: Payer: Self-pay | Admitting: *Deleted

## 2015-09-17 LAB — GC/CHLAMYDIA PROBE AMP (~~LOC~~) NOT AT ARMC
CHLAMYDIA, DNA PROBE: NEGATIVE
NEISSERIA GONORRHEA: NEGATIVE

## 2015-09-18 ENCOUNTER — Telehealth: Payer: Self-pay | Admitting: *Deleted

## 2015-09-18 NOTE — Telephone Encounter (Addendum)
Received a voice message from [place] from [facility] center asking for preauthorization for pelvic ultrasound.  Preauthorization obtained [id number]. Called Tillie Rung- referred to [facility] at [phone]. Called Pam and left message that Authorization obtained and # put on her appointment notes. Call if any questions.

## 2015-09-19 ENCOUNTER — Ambulatory Visit (HOSPITAL_COMMUNITY)
Admission: RE | Admit: 2015-09-19 | Discharge: 2015-09-19 | Disposition: A | Discharge status: Home / Self Care | Payer: Medicaid Other | Source: Ambulatory Visit | Attending: Obstetrics & Gynecology | Admitting: Obstetrics & Gynecology

## 2015-09-19 DIAGNOSIS — R102 Pelvic and perineal pain: Secondary | ICD-10-CM

## 2015-10-01 NOTE — Progress Notes (Signed)
Pt notified of US results.  

## 2015-10-03 ENCOUNTER — Encounter: Payer: Medicaid Other | Attending: Physical Medicine & Rehabilitation | Admitting: Physical Medicine & Rehabilitation

## 2015-10-03 ENCOUNTER — Encounter: Payer: Self-pay | Admitting: Physical Medicine & Rehabilitation

## 2015-10-03 VITALS — BP 142/83 | HR 79

## 2015-10-03 DIAGNOSIS — M5136 Other intervertebral disc degeneration, lumbar region: Secondary | ICD-10-CM | POA: Diagnosis not present

## 2015-10-03 DIAGNOSIS — M545 Low back pain: Secondary | ICD-10-CM | POA: Insufficient documentation

## 2015-10-03 DIAGNOSIS — I639 Cerebral infarction, unspecified: Secondary | ICD-10-CM | POA: Diagnosis not present

## 2015-10-03 DIAGNOSIS — M792 Neuralgia and neuritis, unspecified: Secondary | ICD-10-CM | POA: Diagnosis not present

## 2015-10-03 DIAGNOSIS — H53462 Homonymous bilateral field defects, left side: Secondary | ICD-10-CM | POA: Diagnosis not present

## 2015-10-03 DIAGNOSIS — I69398 Other sequelae of cerebral infarction: Secondary | ICD-10-CM | POA: Insufficient documentation

## 2015-10-03 DIAGNOSIS — M25561 Pain in right knee: Secondary | ICD-10-CM | POA: Diagnosis not present

## 2015-10-03 DIAGNOSIS — M479 Spondylosis, unspecified: Secondary | ICD-10-CM | POA: Insufficient documentation

## 2015-10-03 DIAGNOSIS — M47816 Spondylosis without myelopathy or radiculopathy, lumbar region: Secondary | ICD-10-CM

## 2015-10-03 DIAGNOSIS — I6381 Other cerebral infarction due to occlusion or stenosis of small artery: Secondary | ICD-10-CM

## 2015-10-03 DIAGNOSIS — M129 Arthropathy, unspecified: Secondary | ICD-10-CM | POA: Diagnosis not present

## 2015-10-03 MED ORDER — HYDROCODONE-ACETAMINOPHEN 7.5-325 MG PO TABS: 1.0000 | ORAL_TABLET | Freq: Three times a day (TID) | ORAL | Status: DC | PRN

## 2015-10-03 MED ORDER — GABAPENTIN 100 MG PO CAPS: ORAL_CAPSULE | ORAL | Status: DC

## 2015-10-03 NOTE — Progress Notes (Signed)
Subjective:    Patient ID: Brittany Crawford, female    DOB: November 03, 1955, 59 y.o.   MRN: [id number]  HPI   Brittany Crawford is here in follow up of her chronic pain. She states things are about the same but she feels that her pain is more "under control.' she is stiff in the morning and then loosens up until late in the day when she starts having pain and, tingling again. Her meds seem to help pain. She is active with her stretching. She tries to exercise when she can. Her pain is most severe in the low back when she works around the house but she has burning,tingling pain in her legs as well.   Pain Inventory Average Pain 8 Pain Right Now 8 My pain is dull, tingling and aching  In the last 24 hours, has pain interfered with the following? General activity 5 Relation with others 5 Enjoyment of life 5 What TIME of day is your pain at its worst? NA Sleep (in general) Fair  Pain is worse with: bending, standing and some activites Pain improves with: NA Relief from Meds: 2  Mobility Do you have any goals in this area?  yes  Function disabled: date disabled NA  Neuro/Psych trouble walking  Prior Studies Any changes since last visit?  no  Physicians involved in your care Any changes since last visit?  no   Family History  Problem Relation Age of Onset  . Colon cancer Maternal Uncle   . Stroke Mother    Social History   Social History  . Marital Status: Married    Spouse Name: N/A  . Number of Children: 2  . Years of Education: 12   Social History Main Topics  . Smoking status: Current Every Day Smoker -- 0.50 packs/day for 0 years    Types: Cigarettes  . Smokeless tobacco: Never Used     Comment: 1-2 cigarettes daily for years  . Alcohol Use: 0.6 oz/week    1 Glasses of wine per week     Comment: occasionaly   . Drug Use: No  . Sexual Activity: No   Other Topics Concern  . None   Social History Narrative   Patient is married with 2 children.   Patient is right  handed.   Patient has hs education.   Patient drinks 4 cups daily.   Past Surgical History  Procedure Laterality Date  . Foot surgery      left-pins placed  . Lymph node biopsy    . Dilatation & currettage/hysteroscopy with resectocope N/A 12/10/2012    Procedure: DILATATION & CURETTAGE/HYSTEROSCOPY WITH RESECTOCOPE;  Surgeon: Marvene Staff, MD;  Location: [facility];  Service: Gynecology;  Laterality: N/A;  . Polypectomy N/A 12/10/2012    Procedure: POLYPECTOMY;  Surgeon: Marvene Staff, MD;  Location: [facility];  Service: Gynecology;  Laterality: N/A;   Past Medical History  Diagnosis Date  . H/O sarcoidosis   . Hypertension   . Seasonal allergies   . Chronic back pain   . Stroke ([place])    BP 142/83 mmHg  Pulse 79  SpO2 98%  LMP 11/13/2012  Opioid Risk Score:   Fall Risk Score:  `1  Depression screen PHQ 2/9  Depression screen [facility] 2/9 06/26/2015 05/15/2015 01/02/2015  Decreased Interest 0 0 0  Down, Depressed, Hopeless 0 0 0  PHQ - 2 Score 0 0 0  Altered sleeping - - 0  Tired, decreased energy - - 0  Change  in appetite - - 0  Feeling bad or failure about yourself  - - 0  Trouble concentrating - - 0  Moving slowly or fidgety/restless - - 1  Suicidal thoughts - - 0  PHQ-9 Score - - 1      Review of Systems  Musculoskeletal: Positive for gait problem.  All other systems reviewed and are negative.      Objective:   Physical Exam  General: Alert and oriented x 3, No apparent distress  HEENT: Head is normocephalic, atraumatic, PERRLA, EOMI, sclera anicteric, oral mucosa pink and moist, dentition intact, ext ear canals clear,  Neck: Supple without JVD or lymphadenopathy  Heart: Reg rate and rhythm. No murmurs rubs or gallops  Chest: CTA bilaterally without wheezes, rales, or rhonchi; no distress  Abdomen: Soft, non-tender, non-distended, bowel sounds positive.  Extremities: No clubbing, cyanosis, or edema. Pulses are 2+  Skin: Clean and intact without signs  of breakdown  Neuro: Pt is cognitively appropriate with normal insight, memory, and awareness. Left eye weak with superior, inferior, and lateral tracking. Sees double when she looks to the left. No HH. No obvious facial weakness. Sensory exam is normal on right and 1+ on left face, arm, leg Reflexes are 1+ in all 4's. Fine motor coordination is intact. No tremors. Motor function is grossly 5/5 in all 4s. No resting tone on the left.  Musculoskeletal: Has pain along L4-5 and L5-S1 levels. Severe at L5-S1 with palpation. Had pain with extension and bilateral facet maneuvers. Posture improved. Her lumbar flexion better---can almost touch toes.  She doesn't seem to walk with any gross antalgia on either side. SLR and SST negative. No obvious scoliosis.   Psych: Pt's affect is pleasant and appropriate.  Assessment & Plan:   1. Lumbar spondylosis with DDD and facet arthropathy. Sx appear most prominent at L5-S1 on exam  2. Right knee pain---appears minimal on exam today  3. Right thalamic/internal capsule lacunar infarct with persistent left hemisensory deficits and pain syndrome.  4. ?old BI as youth  5. Hx of marijuana use.   Plan:  1. Continue meloxicam daily as long as ok with neuro  2. Increase gabapentin to 200mg  TID in a two week titration 3. Hydrocodone 5/325 one q8 prn for breakthrough pain,   #75  4. Re-inforced facet based exercises were provided along with extensive education. She is doing a much better job with her HEP 5. No injections at this point  6. Meloxicam prn  7. Follow up with me or NP in 2 months.. 25 minutes of face to face patient care time were spent during this visit. All questions were encouraged and answered.

## 2015-10-03 NOTE — Patient Instructions (Addendum)
CONTINUE WITH YOUR EXERCISES. YOU ARE DOING GREAT JOB!!!!   GABAPENTIN: TAKE 2 TABS IN AM 1 TAB IN PM AND 2 TABS AT NIGHT FOR ONE WEEK THEN INCREASE TO TWO TABS 3X DAILY.  PLEASE CALL ME WITH ANY PROBLEMS OR QUESTIONS [id number]). HAVE A HAPPY HOLIDAY SEASON!!!

## 2015-10-15 ENCOUNTER — Encounter: Payer: Self-pay | Admitting: Obstetrics & Gynecology

## 2015-10-15 ENCOUNTER — Ambulatory Visit (INDEPENDENT_AMBULATORY_CARE_PROVIDER_SITE_OTHER): Payer: Medicare Other | Admitting: Obstetrics & Gynecology

## 2015-10-15 VITALS — BP 142/88 | HR 77 | Temp 99.1°F | Ht 67.5 in | Wt 199.6 lb

## 2015-10-15 DIAGNOSIS — R102 Pelvic and perineal pain: Secondary | ICD-10-CM

## 2015-10-15 NOTE — Patient Instructions (Signed)

## 2015-10-15 NOTE — Progress Notes (Signed)
Subjective:     Patient ID: Brittany Crawford, female   DOB: [birth year], 60 y.o.   MRN: [id number]  HPI [id number] Patient's last menstrual period was 11/13/2012. Patient had pelvic US to evaluate pelvic pain, no bleeding.   Review of Systems  Genitourinary: Positive for dysuria, urgency, frequency, pelvic pain and dyspareunia. Negative for vaginal bleeding and vaginal discharge.       Objective:   Physical Exam  Constitutional: She is oriented to person, place, and time. She appears well-developed. No distress.  Neurological: She is alert and oriented to person, place, and time.  Psychiatric: She has a normal mood and affect. Her behavior is normal.  CLINICAL DATA: Cramping.  EXAM: TRANSABDOMINAL AND TRANSVAGINAL ULTRASOUND OF PELVIS  TECHNIQUE: Both transabdominal and transvaginal ultrasound examinations of the pelvis were performed. Transabdominal technique was performed for global imaging of the pelvis including uterus, ovaries, adnexal regions, and pelvic cul-de-sac. It was necessary to proceed with endovaginal exam following the transabdominal exam to visualize the uterus and ovaries.  COMPARISON: Ultrasound 01/15/2009.  FINDINGS: Uterus  Measurements: 7.3 x 3.4 x 4.7 cm. No fibroids or other mass visualized.  Endometrium  Thickness: 3 mm. No focal abnormality visualized.  Right ovary  Not visualized.  Left ovary  Measurements: 2.2 x 1.6 x 1.0 cm. Normal appearance/no adnexal mass.  Other findings  No free fluid.  IMPRESSION: Negative exam. Right ovary not visualized.   Electronically Signed  By: Marcello Moores Register  On: 09/19/2015 15:25      Assessment:     Pelvic pain, negative pelvic US and no bleeding. She does endorse urinary symptoms that are suspicious for possible painful bladder, IC     Plan:     Recommend urology referral for evaluation. In basket message to Dr. Glendale Chard 10 min face to face and coordination  of care Woodroe Mode, MD 10/15/2015

## 2015-11-01 IMAGING — CR DG KNEE COMPLETE 4+V*R*
4 series · 4 of 4 positions shown · non-contrast
Comparison: None.

CLINICAL DATA: Right knee pain, no known injury, initial encounter

EXAM:
RIGHT KNEE - COMPLETE 4+ VIEW

[knee ap]
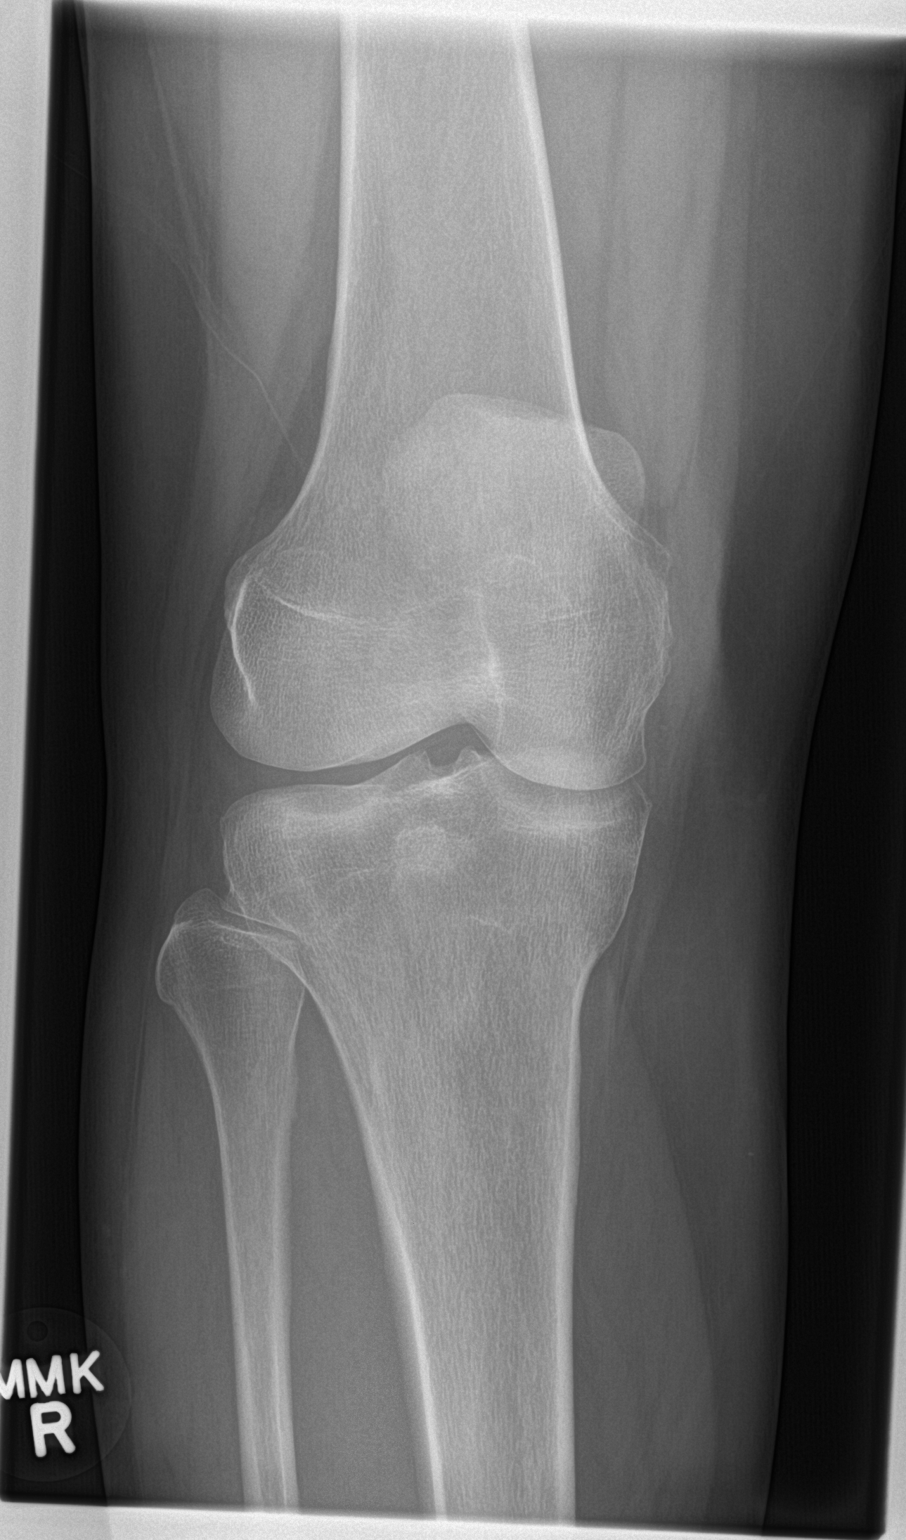

[knee obl (1 of 2)]
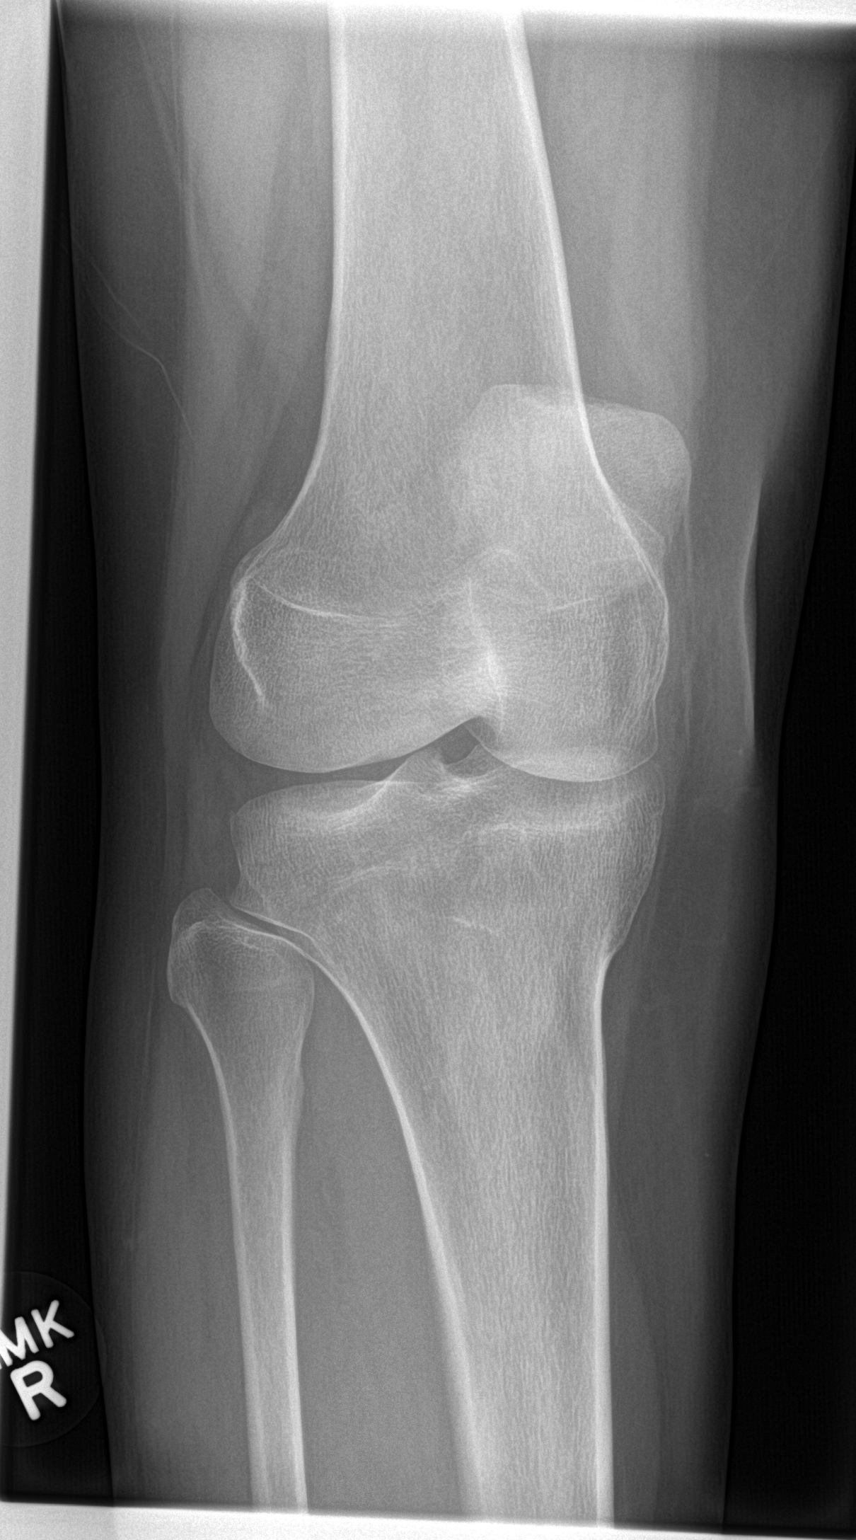

[knee obl (2 of 2)]
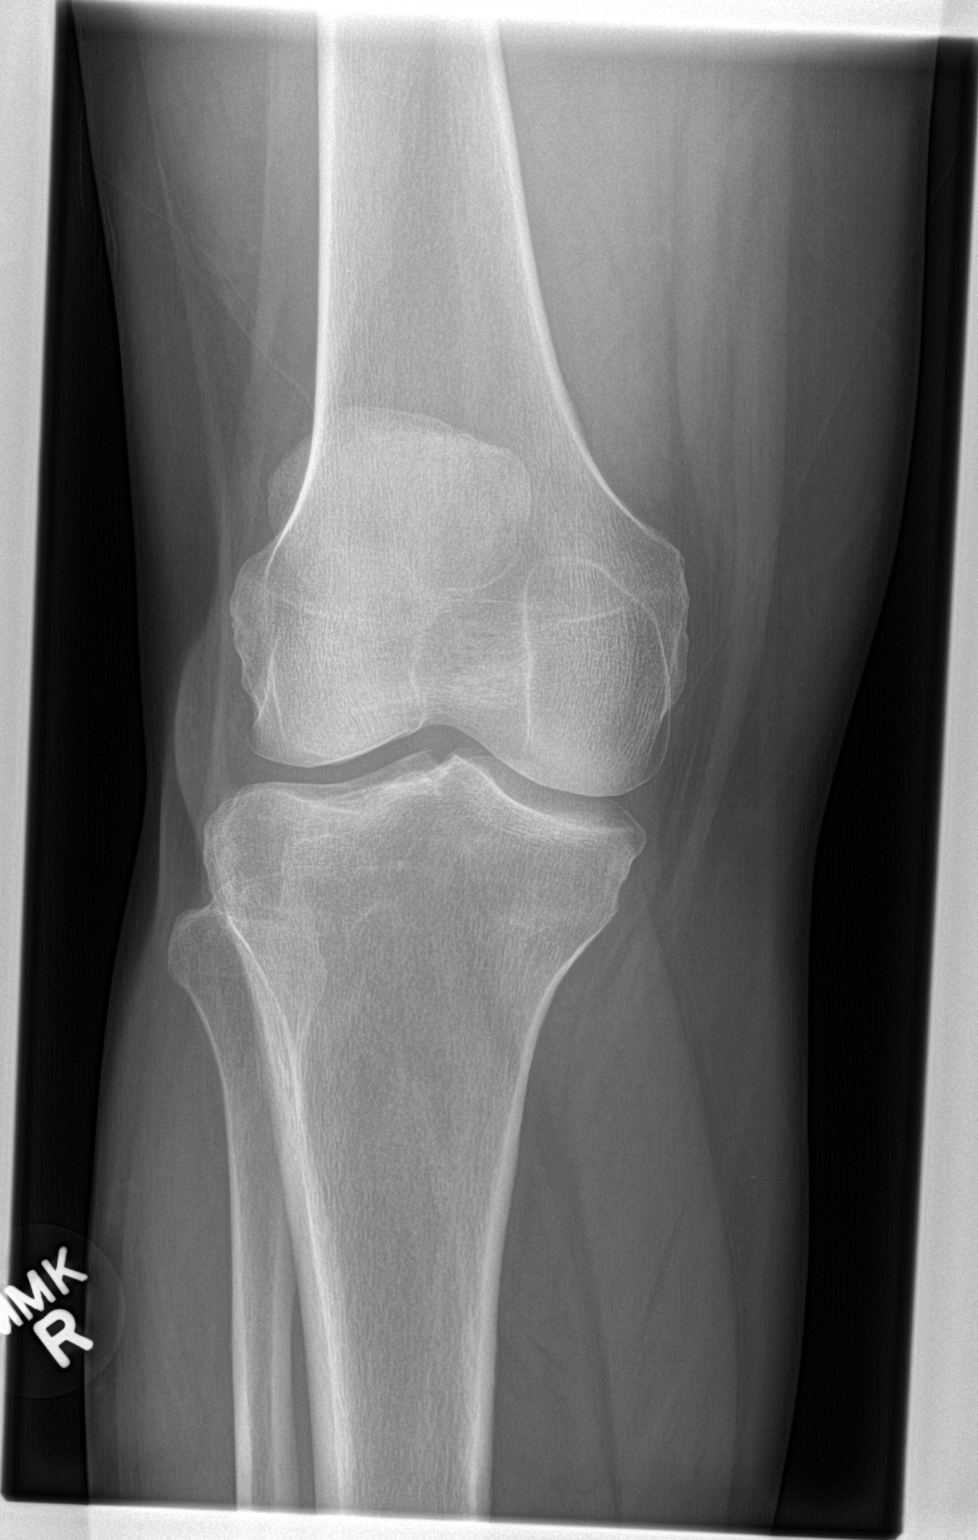

[knee lat]
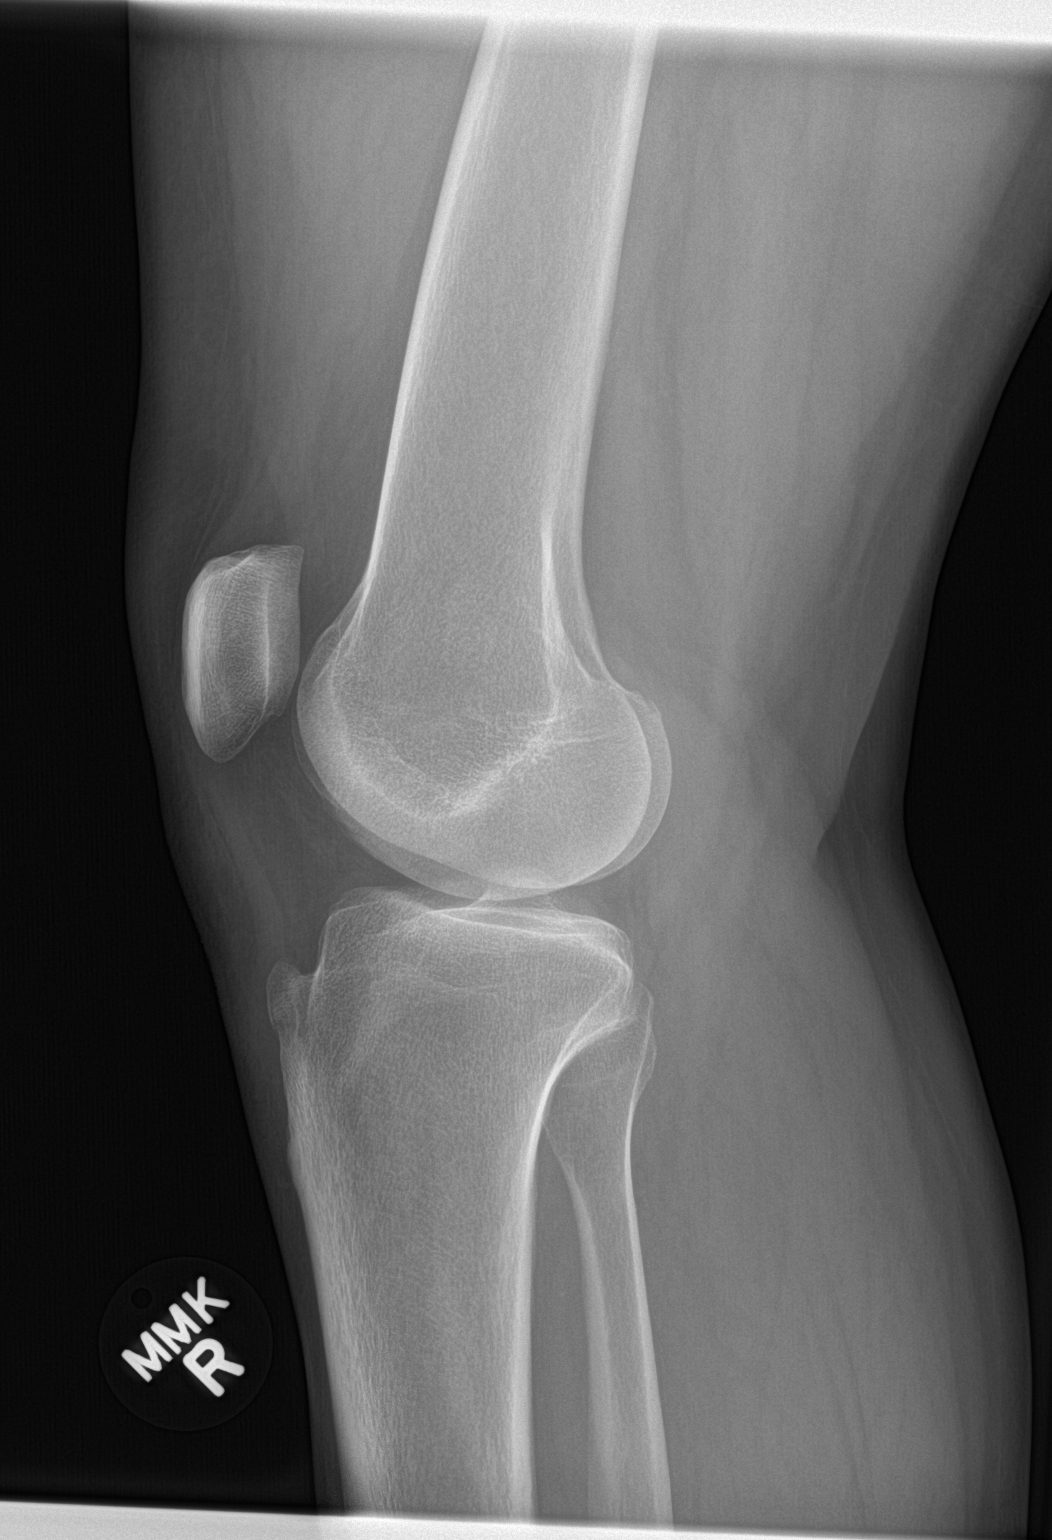

[4 of 4 positions shown; findings below may reference images not displayed]

FINDINGS: There is no evidence of fracture, dislocation, or joint effusion.
There is no evidence of arthropathy or other focal bone abnormality.
Soft tissues are unremarkable.
IMPRESSION: No acute abnormality noted.

## 2015-11-22 ENCOUNTER — Encounter: Payer: Self-pay | Admitting: Registered Nurse

## 2015-11-22 ENCOUNTER — Encounter: Payer: Medicare Other | Attending: Physical Medicine & Rehabilitation | Admitting: Registered Nurse

## 2015-11-22 VITALS — BP 131/83 | HR 100

## 2015-11-22 DIAGNOSIS — Z79899 Other long term (current) drug therapy: Secondary | ICD-10-CM | POA: Diagnosis not present

## 2015-11-22 DIAGNOSIS — Z5181 Encounter for therapeutic drug level monitoring: Secondary | ICD-10-CM

## 2015-11-22 DIAGNOSIS — M47816 Spondylosis without myelopathy or radiculopathy, lumbar region: Secondary | ICD-10-CM

## 2015-11-22 DIAGNOSIS — M479 Spondylosis, unspecified: Secondary | ICD-10-CM | POA: Diagnosis not present

## 2015-11-22 DIAGNOSIS — M545 Low back pain: Secondary | ICD-10-CM | POA: Insufficient documentation

## 2015-11-22 DIAGNOSIS — I69398 Other sequelae of cerebral infarction: Secondary | ICD-10-CM | POA: Insufficient documentation

## 2015-11-22 DIAGNOSIS — G894 Chronic pain syndrome: Secondary | ICD-10-CM | POA: Diagnosis not present

## 2015-11-22 DIAGNOSIS — M25561 Pain in right knee: Secondary | ICD-10-CM | POA: Diagnosis not present

## 2015-11-22 DIAGNOSIS — M129 Arthropathy, unspecified: Secondary | ICD-10-CM | POA: Diagnosis not present

## 2015-11-22 DIAGNOSIS — H53462 Homonymous bilateral field defects, left side: Secondary | ICD-10-CM | POA: Insufficient documentation

## 2015-11-22 DIAGNOSIS — M792 Neuralgia and neuritis, unspecified: Secondary | ICD-10-CM

## 2015-11-22 DIAGNOSIS — M5136 Other intervertebral disc degeneration, lumbar region: Secondary | ICD-10-CM | POA: Diagnosis not present

## 2015-11-22 MED ORDER — HYDROCODONE-ACETAMINOPHEN 7.5-325 MG PO TABS
1.0000 | ORAL_TABLET | Freq: Three times a day (TID) | ORAL | Status: DC | PRN
Start: 2015-11-22 — End: 2015-11-22

## 2015-11-22 MED ORDER — HYDROCODONE-ACETAMINOPHEN 7.5-325 MG PO TABS: 1.0000 | ORAL_TABLET | Freq: Three times a day (TID) | ORAL | Status: DC | PRN

## 2015-11-22 NOTE — Progress Notes (Signed)
Subjective:    Patient ID: Brittany Crawford, female    DOB: 29-Aug-1956, 60 y.o.   MRN: [id number]  HPI: Brittany Crawford is a 60 year old female who returns for follow up for chronic pain and medication refill. She says her pain is located in her lower back. Also states forthe last few days she has been experiencing increase intensity of pain, she denies falling. She rates her pain 10. We will increase her tablets today she verbalizes understanding.  Her current exercise regime is walking  and performing stretching exercises.   Pain Inventory Average Pain 10 Pain Right Now 10 My pain is dull and aching  In the last 24 hours, has pain interfered with the following? General activity 7 Relation with others 6 Enjoyment of life 2 What TIME of day is your pain at its worst? morning evening and night Sleep (in general) NA  Pain is worse with: walking, bending, sitting and standing Pain improves with: rest, heat/ice and medication Relief from Meds: 3  Mobility ability to climb steps?  yes do you drive?  no  Function disabled: date disabled .  Neuro/Psych trouble walking  Prior Studies Any changes since last visit?  no  Physicians involved in your care Any changes since last visit?  no   Family History  Problem Relation Age of Onset  . Colon cancer Maternal Uncle   . Stroke Mother    Social History   Social History  . Marital Status: Married    Spouse Name: N/A  . Number of Children: 2  . Years of Education: 12   Social History Main Topics  . Smoking status: Former Smoker -- 0.50 packs/day for 0 years    Types: Cigarettes    Quit date: 10/15/2015  . Smokeless tobacco: Never Used     Comment: 1-2 cigarettes daily for years  . Alcohol Use: 0.6 oz/week    1 Glasses of wine per week     Comment: occasionaly   . Drug Use: No  . Sexual Activity: Yes   Other Topics Concern  . None   Social History Narrative   Patient is married with 2 children.   Patient  is right handed.   Patient has hs education.   Patient drinks 4 cups daily.   Past Surgical History  Procedure Laterality Date  . Foot surgery      left-pins placed  . Lymph node biopsy    . Dilatation & currettage/hysteroscopy with resectocope N/A 12/10/2012    Procedure: DILATATION & CURETTAGE/HYSTEROSCOPY WITH RESECTOCOPE;  Surgeon: Marvene Staff, MD;  Location: [facility];  Service: Gynecology;  Laterality: N/A;  . Polypectomy N/A 12/10/2012    Procedure: POLYPECTOMY;  Surgeon: Marvene Staff, MD;  Location: [facility];  Service: Gynecology;  Laterality: N/A;   Past Medical History  Diagnosis Date  . H/O sarcoidosis   . Hypertension   . Seasonal allergies   . Chronic back pain   . Stroke ([place])    LMP 11/13/2012  Opioid Risk Score:   Fall Risk Score:  `1  Depression screen PHQ 2/9  Depression screen [facility] 2/9 11/22/2015 06/26/2015 05/15/2015 01/02/2015  Decreased Interest 0 0 0 0  Down, Depressed, Hopeless 0 0 0 0  PHQ - 2 Score 0 0 0 0  Altered sleeping - - - 0  Tired, decreased energy - - - 0  Change in appetite - - - 0  Feeling bad or failure about yourself  - - -  0  Trouble concentrating - - - 0  Moving slowly or fidgety/restless - - - 1  Suicidal thoughts - - - 0  PHQ-9 Score - - - 1     Review of Systems  All other systems reviewed and are negative.      Objective:   Physical Exam  Constitutional: She is oriented to person, place, and time. She appears well-developed and well-nourished.  HENT:  Head: Normocephalic and atraumatic.  Neck: Normal range of motion. Neck supple.  Cardiovascular: Normal rate and regular rhythm.   Pulmonary/Chest: Effort normal and breath sounds normal.  Musculoskeletal:  Normal Muscle Bulk and Muscle Testing Reveals: Upper Extremities: Full ROM and Muscle Strength 5/5 Back without spinal or paraspinal tenderness Lower Extremities: Full ROM and Muscle Strength 5/5 Arises from chair with ease Narrow Based Gait  Neurological:  She is alert and oriented to person, place, and time.  Skin: Skin is warm and dry.  Psychiatric: She has a normal mood and affect.  Nursing note and vitals reviewed.         Assessment & Plan:  1. Lumbar spondylosis with DDD and facet arthropathy Refilled: Increased: Hydrocodone 7.5 /325 mg one tablet every 8 hours as needed for pain #85. Second script given for the following month 2. Right knee pain: No complaints today: Continue with heat/ice, exercise and Voltaren Gel. Alternate with Mobic 3. Right thalamic/internal capsule lacunar infarct with persistent left hemisensory deficits: Continue to Monitor  20 minutes of face to face patient care time was spent during this visit. All questions were encouraged and answered.  F/U in 1 month

## 2015-11-27 LAB — TOXASSURE SELECT,+ANTIDEPR,UR: PDF: 0

## 2015-11-27 NOTE — Progress Notes (Signed)
Urine drug screen for this encounter is consistent for prescribed medication 

## 2015-12-04 ENCOUNTER — Ambulatory Visit: Payer: Medicaid Other | Admitting: Registered Nurse

## 2016-01-21 ENCOUNTER — Encounter: Payer: Self-pay | Admitting: Registered Nurse

## 2016-01-21 ENCOUNTER — Encounter: Payer: Medicare Other | Attending: Physical Medicine & Rehabilitation | Admitting: Registered Nurse

## 2016-01-21 VITALS — BP 146/93 | HR 83

## 2016-01-21 DIAGNOSIS — I6381 Other cerebral infarction due to occlusion or stenosis of small artery: Secondary | ICD-10-CM

## 2016-01-21 DIAGNOSIS — H53462 Homonymous bilateral field defects, left side: Secondary | ICD-10-CM | POA: Diagnosis not present

## 2016-01-21 DIAGNOSIS — M545 Low back pain: Secondary | ICD-10-CM | POA: Diagnosis not present

## 2016-01-21 DIAGNOSIS — I639 Cerebral infarction, unspecified: Secondary | ICD-10-CM

## 2016-01-21 DIAGNOSIS — M25561 Pain in right knee: Secondary | ICD-10-CM | POA: Diagnosis not present

## 2016-01-21 DIAGNOSIS — Z5181 Encounter for therapeutic drug level monitoring: Secondary | ICD-10-CM

## 2016-01-21 DIAGNOSIS — M792 Neuralgia and neuritis, unspecified: Secondary | ICD-10-CM

## 2016-01-21 DIAGNOSIS — Z79899 Other long term (current) drug therapy: Secondary | ICD-10-CM

## 2016-01-21 DIAGNOSIS — M129 Arthropathy, unspecified: Secondary | ICD-10-CM | POA: Insufficient documentation

## 2016-01-21 DIAGNOSIS — M479 Spondylosis, unspecified: Secondary | ICD-10-CM | POA: Diagnosis not present

## 2016-01-21 DIAGNOSIS — I69398 Other sequelae of cerebral infarction: Secondary | ICD-10-CM | POA: Diagnosis not present

## 2016-01-21 DIAGNOSIS — G894 Chronic pain syndrome: Secondary | ICD-10-CM

## 2016-01-21 DIAGNOSIS — M5136 Other intervertebral disc degeneration, lumbar region: Secondary | ICD-10-CM | POA: Diagnosis not present

## 2016-01-21 DIAGNOSIS — M47816 Spondylosis without myelopathy or radiculopathy, lumbar region: Secondary | ICD-10-CM

## 2016-01-21 MED ORDER — HYDROCODONE-ACETAMINOPHEN 7.5-325 MG PO TABS
1.0000 | ORAL_TABLET | Freq: Three times a day (TID) | ORAL | Status: DC | PRN
Start: 2016-01-21 — End: 2016-01-21

## 2016-01-21 MED ORDER — HYDROCODONE-ACETAMINOPHEN 7.5-325 MG PO TABS: 1.0000 | ORAL_TABLET | Freq: Three times a day (TID) | ORAL | Status: DC | PRN

## 2016-01-21 NOTE — Progress Notes (Signed)
Subjective:    Patient ID: Brittany Crawford, female    DOB: Sep 03, 1956, 60 y.o.   MRN: [id number]  HPI: [name] is a 60 year old female who returns for follow up for chronic pain and medication refill. She states her pain is located in her lower back. Also states she has tingling in her left lower extremity and left foot. She rates her pain 3. Her current exercise regime is using her Nordic track,walking and performing stretching exercises.   Pain Inventory Average Pain 5 Pain Right Now 3 My pain is intermittent, dull, tingling and aching  In the last 24 hours, has pain interfered with the following? General activity 5 Relation with others 10 Enjoyment of life 8 What TIME of day is your pain at its worst? varies Sleep (in general) Good  Pain is worse with: no selection Pain improves with: medication Relief from Meds: 5  Mobility walk without assistance ability to climb steps?  yes do you drive?  no Do you have any goals in this area?  yes  Function disabled: date disabled . I need assistance with the following:  household duties and shopping  Neuro/Psych tingling  Prior Studies Any changes since last visit?  no  Physicians involved in your care Any changes since last visit?  no   Family History  Problem Relation Age of Onset  . Colon cancer Maternal Uncle   . Stroke Mother    Social History   Social History  . Marital Status: Married    Spouse Name: N/A  . Number of Children: 2  . Years of Education: 12   Social History Main Topics  . Smoking status: Former Smoker -- 0.50 packs/day for 0 years    Types: Cigarettes    Quit date: 10/15/2015  . Smokeless tobacco: Never Used     Comment: 1-2 cigarettes daily for years  . Alcohol Use: 0.6 oz/week    1 Glasses of wine per week     Comment: occasionaly   . Drug Use: No  . Sexual Activity: Yes   Other Topics Concern  . None   Social History Narrative   Patient is married with 2  children.   Patient is right handed.   Patient has hs education.   Patient drinks 4 cups daily.   Past Surgical History  Procedure Laterality Date  . Foot surgery      left-pins placed  . Lymph node biopsy    . Dilatation & currettage/hysteroscopy with resectocope N/A 12/10/2012    Procedure: DILATATION & CURETTAGE/HYSTEROSCOPY WITH RESECTOCOPE;  Surgeon: Marvene Staff, MD;  Location: [facility];  Service: Gynecology;  Laterality: N/A;  . Polypectomy N/A 12/10/2012    Procedure: POLYPECTOMY;  Surgeon: Marvene Staff, MD;  Location: [facility];  Service: Gynecology;  Laterality: N/A;   Past Medical History  Diagnosis Date  . H/O sarcoidosis   . Hypertension   . Seasonal allergies   . Chronic back pain   . Stroke ([place])    BP 146/93 mmHg  Pulse 83  SpO2 98%  LMP 11/13/2012  Opioid Risk Score:   Fall Risk Score:  `1  Depression screen PHQ 2/9  Depression screen [facility] 2/9 11/22/2015 06/26/2015 05/15/2015 01/02/2015  Decreased Interest 0 0 0 0  Down, Depressed, Hopeless 0 0 0 0  PHQ - 2 Score 0 0 0 0  Altered sleeping - - - 0  Tired, decreased energy - - - 0  Change in appetite - - -  0  Feeling bad or failure about yourself  - - - 0  Trouble concentrating - - - 0  Moving slowly or fidgety/restless - - - 1  Suicidal thoughts - - - 0  PHQ-9 Score - - - 1     Review of Systems  All other systems reviewed and are negative.      Objective:   Physical Exam  Constitutional: She is oriented to person, place, and time. She appears well-developed and well-nourished.  HENT:  Head: Normocephalic and atraumatic.  Neck: Normal range of motion. Neck supple.  Cardiovascular: Normal rate and regular rhythm.   Pulmonary/Chest: Effort normal and breath sounds normal.  Musculoskeletal:  Normal Muscle Bulk and Muscle Testing Reveals:  Upper Extremities: Full ROM and Muscle Strength 5/5 Back without spinal tenderness Lower Extremities: Full ROM and Muscle Strength 5/5 Arises from  chair with ease Narrow Based Gait  Neurological: She is alert and oriented to person, place, and time.  Skin: Skin is warm and dry.  Psychiatric: She has a normal mood and affect.  Nursing note and vitals reviewed.         Assessment & Plan:  1. Lumbar spondylosis with DDD and facet arthropathy Refilled: Hydrocodone 7.5 /325 mg one tablet every 8 hours as needed for pain #85. Second script given for the following month. We will continue the opioid monitoring program, this consists of regular clinic visits, examinations, urine drug screen, pill counts as well as use of [place] Controlled Substance Reporting System. 2. Right knee pain: No complaints today: Continue with heat/ice, exercise and Voltaren Gel. Alternate with Mobic 3. Right thalamic/internal capsule lacunar infarct with persistent left hemisensory deficits: Continue to Monitor  20 minutes of face to face patient care time was spent during this visit. All questions were encouraged and answered.  F/U in 1 month

## 2016-03-04 ENCOUNTER — Other Ambulatory Visit: Payer: Self-pay | Admitting: Physical Medicine & Rehabilitation

## 2016-03-17 ENCOUNTER — Encounter: Payer: Self-pay | Admitting: Physical Medicine & Rehabilitation

## 2016-03-17 ENCOUNTER — Encounter: Payer: Medicare Other | Attending: Physical Medicine & Rehabilitation | Admitting: Physical Medicine & Rehabilitation

## 2016-03-17 VITALS — BP 126/75 | HR 84 | Resp 16

## 2016-03-17 DIAGNOSIS — I69398 Other sequelae of cerebral infarction: Secondary | ICD-10-CM | POA: Insufficient documentation

## 2016-03-17 DIAGNOSIS — M25561 Pain in right knee: Secondary | ICD-10-CM | POA: Insufficient documentation

## 2016-03-17 DIAGNOSIS — M129 Arthropathy, unspecified: Secondary | ICD-10-CM | POA: Diagnosis not present

## 2016-03-17 DIAGNOSIS — M47816 Spondylosis without myelopathy or radiculopathy, lumbar region: Secondary | ICD-10-CM

## 2016-03-17 DIAGNOSIS — M5136 Other intervertebral disc degeneration, lumbar region: Secondary | ICD-10-CM | POA: Insufficient documentation

## 2016-03-17 DIAGNOSIS — I6381 Other cerebral infarction due to occlusion or stenosis of small artery: Secondary | ICD-10-CM

## 2016-03-17 DIAGNOSIS — H53462 Homonymous bilateral field defects, left side: Secondary | ICD-10-CM | POA: Diagnosis not present

## 2016-03-17 DIAGNOSIS — Z79899 Other long term (current) drug therapy: Secondary | ICD-10-CM

## 2016-03-17 DIAGNOSIS — M479 Spondylosis, unspecified: Secondary | ICD-10-CM | POA: Insufficient documentation

## 2016-03-17 DIAGNOSIS — M545 Low back pain: Secondary | ICD-10-CM | POA: Diagnosis not present

## 2016-03-17 DIAGNOSIS — G894 Chronic pain syndrome: Secondary | ICD-10-CM | POA: Diagnosis not present

## 2016-03-17 DIAGNOSIS — Z5181 Encounter for therapeutic drug level monitoring: Secondary | ICD-10-CM

## 2016-03-17 DIAGNOSIS — I639 Cerebral infarction, unspecified: Secondary | ICD-10-CM

## 2016-03-17 MED ORDER — GABAPENTIN 300 MG PO CAPS: 300.0000 mg | ORAL_CAPSULE | Freq: Three times a day (TID) | ORAL | Status: DC

## 2016-03-17 MED ORDER — HYDROCODONE-ACETAMINOPHEN 7.5-325 MG PO TABS: 1.0000 | ORAL_TABLET | Freq: Three times a day (TID) | ORAL | Status: DC | PRN

## 2016-03-17 NOTE — Patient Instructions (Signed)
TAKE THREE OF YOUR 100MG  GABAPENTIN CAPSULES THREE X DAILY UNTIL THEY RUN OUT----THEN START THE 300MG  CAPSULES AND TAKE THEM THREE X DAILY.   PLEASE CALL ME WITH ANY PROBLEMS OR QUESTIONS [id number]).

## 2016-03-17 NOTE — Progress Notes (Signed)
Subjective:    Patient ID: Brittany Crawford, female    DOB: 1955-12-31, 60 y.o.   MRN: [id number]  HPI   [facility] is here in follow up of her chronic pain. She has been doing well for the most part. She likes to stay active. She was a little more sore after working out hard in the gym last week. She does get stiff with the cooler and damp weather.   She finds that the TID gabapentin has helped her a lot. She is also using hydrocodone 2-3 x per day for breakthrough pain    Pain Inventory Average Pain 6 Pain Right Now 8 My pain is dull, tingling and aching  In the last 24 hours, has pain interfered with the following? General activity 4 Relation with others 10 Enjoyment of life 10 What TIME of day is your pain at its worst? evening Sleep (in general) NA  Pain is worse with: walking, bending, sitting, inactivity, standing and some activites Pain improves with: rest, heat/ice, therapy/exercise and medication Relief from Meds: good  Mobility how many minutes can you walk? 10 ability to climb steps?  yes do you drive?  no Do you have any goals in this area?  yes  Function disabled: date disabled NA I need assistance with the following:  meal prep, household duties and shopping  Neuro/Psych weakness tingling trouble walking  Prior Studies Any changes since last visit?  no  Physicians involved in your care Any changes since last visit?  no   Family History  Problem Relation Age of Onset  . Colon cancer Maternal Uncle   . Stroke Mother    Social History   Social History  . Marital Status: Married    Spouse Name: N/A  . Number of Children: 2  . Years of Education: 12   Social History Main Topics  . Smoking status: Former Smoker -- 0.50 packs/day for 0 years    Types: Cigarettes    Quit date: 10/15/2015  . Smokeless tobacco: Never Used     Comment: 1-2 cigarettes daily for years  . Alcohol Use: 0.6 oz/week    1 Glasses of wine per week     Comment:  occasionaly   . Drug Use: No  . Sexual Activity: Yes   Other Topics Concern  . None   Social History Narrative   Patient is married with 2 children.   Patient is right handed.   Patient has hs education.   Patient drinks 4 cups daily.   Past Surgical History  Procedure Laterality Date  . Foot surgery      left-pins placed  . Lymph node biopsy    . Dilatation & currettage/hysteroscopy with resectocope N/A 12/10/2012    Procedure: DILATATION & CURETTAGE/HYSTEROSCOPY WITH RESECTOCOPE;  Surgeon: Marvene Staff, MD;  Location: [facility];  Service: Gynecology;  Laterality: N/A;  . Polypectomy N/A 12/10/2012    Procedure: POLYPECTOMY;  Surgeon: Marvene Staff, MD;  Location: [facility];  Service: Gynecology;  Laterality: N/A;   Past Medical History  Diagnosis Date  . H/O sarcoidosis   . Hypertension   . Seasonal allergies   . Chronic back pain   . Stroke ([place])    BP 126/75 mmHg  Pulse 84  Resp 16  SpO2 97%  LMP 11/13/2012  Opioid Risk Score:   Fall Risk Score:  `1  Depression screen PHQ 2/9  Depression screen [facility] 2/9 11/22/2015 06/26/2015 05/15/2015 01/02/2015  Decreased Interest 0 0 0 0  Down, Depressed, Hopeless 0 0 0 0  PHQ - 2 Score 0 0 0 0  Altered sleeping - - - 0  Tired, decreased energy - - - 0  Change in appetite - - - 0  Feeling bad or failure about yourself  - - - 0  Trouble concentrating - - - 0  Moving slowly or fidgety/restless - - - 1  Suicidal thoughts - - - 0  PHQ-9 Score - - - 1     Review of Systems  Musculoskeletal: Positive for gait problem.  Neurological: Positive for weakness.       Tingling   All other systems reviewed and are negative.      Objective:   Physical Exam  General: Alert and oriented x 3, No apparent distress  HEENT: Head is normocephalic, atraumatic, PERRLA, EOMI, sclera anicteric, oral mucosa pink and moist, dentition intact, ext ear canals clear,  Neck: Supple without JVD or lymphadenopathy  Heart: Reg rate and  rhythm. No murmurs rubs or gallops  Chest: CTA bilaterally without wheezes, rales, or rhonchi; no distress  Abdomen: Soft, non-tender, non-distended, bowel sounds positive.  Extremities: No clubbing, cyanosis, or edema. Pulses are 2+  Skin: Clean and intact without signs of breakdown  Neuro: Pt is cognitively appropriate with normal insight, memory, and awareness. Left eye weak with superior, inferior, and lateral tracking. Sees double when she looks to the left. No HH. No obvious facial weakness. Sensory exam is normal on right and 1+ on left face, arm, leg Reflexes are 1+ in all 4's. Fine motor coordination is intact. No tremors. Motor function is grossly 5/5 in all 4s. No resting tone on the left.  Musculoskeletal: Has pain along L4-5 and L5-S1 levels. Severe at L5-S1 with palpation. Had pain with extension and bilateral facet maneuvers still. Her lumbar flexion is better---can almost touch toes. She doesn't seem to walk with any gross antalgia on either side. SLR and SST negative. No obvious scoliosis.  Psych: Pt's affect is pleasant and appropriate.   Assessment & Plan:   1. Lumbar spondylosis with DDD and facet arthropathy. Sx appear most prominent at L5-S1 on exam  2. Right knee pain---appears minimal on exam today  3. Right thalamic/internal capsule lacunar infarct with persistent left hemisensory deficits and pain syndrome.  4. ?old BI as youth   5. Hx of marijuana use.    Plan:  1. Continue meloxicam daily as long as ok with neuro  2. Continue gabapentin and increase to 300mg  TID  3. Hydrocodone 5/325 one q8 prn for breakthrough pain, #85 --wean down if gabapentin increase is helpful 4. Continue with HEP.   5. No injections at this point  6. Meloxicam prn  7. Follow up with me or NP in 2 months. 25 minutes of face to face patient care time were spent during this visit. All questions were encouraged and answered.

## 2016-03-17 NOTE — Addendum Note (Signed)
Addended by: Kyra Manges on: 03/17/2016 03:54 PM   Modules accepted: Orders

## 2016-03-21 ENCOUNTER — Other Ambulatory Visit: Payer: Self-pay | Admitting: Family

## 2016-03-21 ENCOUNTER — Ambulatory Visit
Admission: RE | Admit: 2016-03-21 | Discharge: 2016-03-21 | Disposition: A | Discharge status: Home / Self Care | Payer: Medicare Other | Source: Ambulatory Visit

## 2016-03-21 DIAGNOSIS — Z1231 Encounter for screening mammogram for malignant neoplasm of breast: Secondary | ICD-10-CM

## 2016-03-24 ENCOUNTER — Ambulatory Visit: Payer: Medicare Other | Admitting: Physical Medicine & Rehabilitation

## 2016-03-26 ENCOUNTER — Other Ambulatory Visit: Payer: Self-pay | Admitting: Family

## 2016-03-26 DIAGNOSIS — R928 Other abnormal and inconclusive findings on diagnostic imaging of breast: Secondary | ICD-10-CM

## 2016-03-27 LAB — TOXASSURE SELECT,+ANTIDEPR,UR: PDF: 0

## 2016-03-31 NOTE — Progress Notes (Signed)
Urine drug screen for this encounter is consistent for prescribed medication 

## 2016-04-02 ENCOUNTER — Other Ambulatory Visit: Payer: Self-pay

## 2016-04-02 ENCOUNTER — Other Ambulatory Visit: Payer: Self-pay | Admitting: Family

## 2016-04-02 ENCOUNTER — Ambulatory Visit
Admission: RE | Admit: 2016-04-02 | Discharge: 2016-04-02 | Disposition: A | Discharge status: Home / Self Care | Payer: Medicare Other | Source: Ambulatory Visit | Attending: Family | Admitting: Family

## 2016-04-02 DIAGNOSIS — R928 Other abnormal and inconclusive findings on diagnostic imaging of breast: Secondary | ICD-10-CM

## 2016-04-02 DIAGNOSIS — N6320 Unspecified lump in the left breast, unspecified quadrant: Secondary | ICD-10-CM

## 2016-04-16 ENCOUNTER — Encounter: Payer: Medicare Other | Admitting: Registered Nurse

## 2016-04-23 ENCOUNTER — Other Ambulatory Visit: Payer: Self-pay | Admitting: Family

## 2016-04-23 DIAGNOSIS — N6320 Unspecified lump in the left breast, unspecified quadrant: Secondary | ICD-10-CM

## 2016-04-23 HISTORY — PX: BREAST BIOPSY: SHX20

## 2016-04-24 ENCOUNTER — Ambulatory Visit
Admission: RE | Admit: 2016-04-24 | Discharge: 2016-04-24 | Disposition: A | Discharge status: Home / Self Care | Payer: Medicare Other | Source: Ambulatory Visit | Attending: Family | Admitting: Family

## 2016-04-24 ENCOUNTER — Other Ambulatory Visit: Payer: Self-pay | Admitting: Family

## 2016-04-24 DIAGNOSIS — N6320 Unspecified lump in the left breast, unspecified quadrant: Secondary | ICD-10-CM

## 2016-04-25 ENCOUNTER — Other Ambulatory Visit: Payer: Self-pay | Admitting: Family

## 2016-04-25 DIAGNOSIS — N6320 Unspecified lump in the left breast, unspecified quadrant: Secondary | ICD-10-CM

## 2016-04-28 ENCOUNTER — Encounter: Payer: Medicare Other | Attending: Physical Medicine & Rehabilitation | Admitting: Registered Nurse

## 2016-04-28 ENCOUNTER — Encounter: Payer: Self-pay | Admitting: Registered Nurse

## 2016-04-28 VITALS — BP 133/74 | HR 84

## 2016-04-28 DIAGNOSIS — I69398 Other sequelae of cerebral infarction: Secondary | ICD-10-CM | POA: Diagnosis not present

## 2016-04-28 DIAGNOSIS — G894 Chronic pain syndrome: Secondary | ICD-10-CM

## 2016-04-28 DIAGNOSIS — M479 Spondylosis, unspecified: Secondary | ICD-10-CM | POA: Insufficient documentation

## 2016-04-28 DIAGNOSIS — M25561 Pain in right knee: Secondary | ICD-10-CM | POA: Diagnosis not present

## 2016-04-28 DIAGNOSIS — M129 Arthropathy, unspecified: Secondary | ICD-10-CM | POA: Diagnosis not present

## 2016-04-28 DIAGNOSIS — Z5181 Encounter for therapeutic drug level monitoring: Secondary | ICD-10-CM | POA: Diagnosis not present

## 2016-04-28 DIAGNOSIS — M545 Low back pain: Secondary | ICD-10-CM | POA: Insufficient documentation

## 2016-04-28 DIAGNOSIS — M792 Neuralgia and neuritis, unspecified: Secondary | ICD-10-CM

## 2016-04-28 DIAGNOSIS — M25562 Pain in left knee: Secondary | ICD-10-CM

## 2016-04-28 DIAGNOSIS — M47816 Spondylosis without myelopathy or radiculopathy, lumbar region: Secondary | ICD-10-CM

## 2016-04-28 DIAGNOSIS — H53462 Homonymous bilateral field defects, left side: Secondary | ICD-10-CM | POA: Diagnosis not present

## 2016-04-28 DIAGNOSIS — M5136 Other intervertebral disc degeneration, lumbar region: Secondary | ICD-10-CM | POA: Insufficient documentation

## 2016-04-28 DIAGNOSIS — I639 Cerebral infarction, unspecified: Secondary | ICD-10-CM | POA: Diagnosis not present

## 2016-04-28 DIAGNOSIS — Z79899 Other long term (current) drug therapy: Secondary | ICD-10-CM

## 2016-04-28 MED ORDER — HYDROCODONE-ACETAMINOPHEN 7.5-325 MG PO TABS: 1.0000 | ORAL_TABLET | Freq: Three times a day (TID) | ORAL | Status: DC | PRN

## 2016-04-28 MED ORDER — DICLOFENAC SODIUM 1 % TD GEL: 2.0000 g | Freq: Four times a day (QID) | TRANSDERMAL | Status: DC

## 2016-04-28 NOTE — Progress Notes (Signed)
Subjective:    Patient ID: Brittany Crawford, female    DOB: [birth year], 58 y.o.   MRN: [id number]  HPI: Brittany Crawford is a 60 year old female who returns for follow up for chronic pain and medication refill. She states her pain is located in her lower back. She rates her pain 4. Her current exercise regime is using her Nordic track,walking and performing stretching exercises.   Pain Inventory Average Pain 4 Pain Right Now 4 My pain is na  In the last 24 hours, has pain interfered with the following? General activity 5 Relation with others 10 Enjoyment of life 10 What TIME of day is your pain at its worst? evening Sleep (in general) NA  Pain is worse with: walking, bending and standing Pain improves with: rest, heat/ice, therapy/exercise and medication Relief from Meds: 10  Mobility ability to climb steps?  no do you drive?  no  Function disabled: date disabled . I need assistance with the following:  household duties and shopping  Neuro/Psych tingling  Prior Studies Any changes since last visit?  no  Physicians involved in your care Any changes since last visit?  no   Family History  Problem Relation Age of Onset  . Colon cancer Maternal Uncle   . Stroke Mother    Social History   Social History  . Marital Status: Married    Spouse Name: N/A  . Number of Children: 2  . Years of Education: 12   Social History Main Topics  . Smoking status: Former Smoker -- 0.50 packs/day for 0 years    Types: Cigarettes    Quit date: 10/15/2015  . Smokeless tobacco: Never Used     Comment: 1-2 cigarettes daily for years  . Alcohol Use: 0.6 oz/week    1 Glasses of wine per week     Comment: occasionaly   . Drug Use: No  . Sexual Activity: Yes   Other Topics Concern  . None   Social History Narrative   Patient is married with 2 children.   Patient is right handed.   Patient has hs education.   Patient drinks 4 cups daily.   Past Surgical History    Procedure Laterality Date  . Foot surgery      left-pins placed  . Lymph node biopsy    . Dilatation & currettage/hysteroscopy with resectocope N/A 12/10/2012    Procedure: DILATATION & CURETTAGE/HYSTEROSCOPY WITH RESECTOCOPE;  Surgeon: Marvene Staff, MD;  Location: [facility];  Service: Gynecology;  Laterality: N/A;  . Polypectomy N/A 12/10/2012    Procedure: POLYPECTOMY;  Surgeon: Marvene Staff, MD;  Location: [facility];  Service: Gynecology;  Laterality: N/A;   Past Medical History  Diagnosis Date  . H/O sarcoidosis   . Hypertension   . Seasonal allergies   . Chronic back pain   . Stroke ([place])    BP 133/74 mmHg  Pulse 84  SpO2 96%  LMP 11/13/2012  Opioid Risk Score:   Fall Risk Score:  `1  Depression screen PHQ 2/9  Depression screen [facility] 2/9 04/28/2016 11/22/2015 06/26/2015 05/15/2015 01/02/2015  Decreased Interest 0 0 0 0 0  Down, Depressed, Hopeless 0 0 0 0 0  PHQ - 2 Score 0 0 0 0 0  Altered sleeping - - - - 0  Tired, decreased energy - - - - 0  Change in appetite - - - - 0  Feeling bad or failure about yourself  - - - -  0  Trouble concentrating - - - - 0  Moving slowly or fidgety/restless - - - - 1  Suicidal thoughts - - - - 0  PHQ-9 Score - - - - 1     Review of Systems  All other systems reviewed and are negative.      Objective:   Physical Exam  Constitutional: She is oriented to person, place, and time. She appears well-developed and well-nourished.  HENT:  Head: Normocephalic and atraumatic.  Neck: Normal range of motion. Neck supple.  Cardiovascular: Normal rate and regular rhythm.   Pulmonary/Chest: Effort normal and breath sounds normal.  Musculoskeletal:  Normal Muscle Bulk and Muscle Testing Reveals: Upper Extremities: Full ROM and Muscle Strength 5/5 Lumbar Paraspinal Tenderness: L-3- L-5 Lower Extremities: Full ROM and Muscle Strength 5/5 Arises from table with ease Narrow Based Gait  Neurological: She is alert and oriented to person,  place, and time.  Skin: Skin is warm and dry.  Psychiatric: She has a normal mood and affect.  Nursing note and vitals reviewed.         Assessment & Plan:  1. Lumbar spondylosis with DDD and facet arthropathy Refilled: Hydrocodone 7.5 /325 mg one tablet every 8 hours as needed for pain #85. Second script given for the following month. We will continue the opioid monitoring program, this consists of regular clinic visits, examinations, urine drug screen, pill counts as well as use of [place] Controlled Substance Reporting System. 2. Bilateral knee pain: RX: Bilateral Knee X-ray: Continue with heat/ice, exercise and Voltaren Gel. Alternate with Mobic 3. Right thalamic/internal capsule lacunar infarct with persistent left hemisensory deficits: Continue to Monitor  20 minutes of face to face patient care time was spent during this visit. All questions were encouraged and answered.  F/U in 1 month

## 2016-04-29 ENCOUNTER — Ambulatory Visit
Admission: RE | Admit: 2016-04-29 | Discharge: 2016-04-29 | Disposition: A | Discharge status: Home / Self Care | Payer: Medicare Other | Source: Ambulatory Visit | Attending: Family | Admitting: Family

## 2016-04-29 ENCOUNTER — Ambulatory Visit (HOSPITAL_COMMUNITY)
Admission: RE | Admit: 2016-04-29 | Discharge: 2016-04-29 | Disposition: A | Discharge status: Home / Self Care | Payer: Medicare Other | Source: Ambulatory Visit | Attending: Registered Nurse | Admitting: Registered Nurse

## 2016-04-29 DIAGNOSIS — M25561 Pain in right knee: Secondary | ICD-10-CM | POA: Insufficient documentation

## 2016-04-29 DIAGNOSIS — M25562 Pain in left knee: Secondary | ICD-10-CM | POA: Diagnosis present

## 2016-04-29 DIAGNOSIS — N6320 Unspecified lump in the left breast, unspecified quadrant: Secondary | ICD-10-CM

## 2016-04-29 HISTORY — PX: BREAST BIOPSY: SHX20

## 2016-04-30 ENCOUNTER — Telehealth: Payer: Self-pay | Admitting: Registered Nurse

## 2016-04-30 ENCOUNTER — Other Ambulatory Visit: Payer: Medicare Other

## 2016-04-30 NOTE — Telephone Encounter (Signed)
Placed a call to Ms. Wale, Reviewed Bilateral Knee X-rays, she verbalizes understanding.

## 2016-06-12 ENCOUNTER — Ambulatory Visit (INDEPENDENT_AMBULATORY_CARE_PROVIDER_SITE_OTHER): Payer: Medicare Other | Admitting: Neurology

## 2016-06-12 ENCOUNTER — Encounter: Payer: Self-pay | Admitting: Neurology

## 2016-06-12 VITALS — BP 113/81 | HR 72 | Ht 68.0 in | Wt 196.6 lb

## 2016-06-12 DIAGNOSIS — I639 Cerebral infarction, unspecified: Secondary | ICD-10-CM

## 2016-06-12 DIAGNOSIS — R202 Paresthesia of skin: Principal | ICD-10-CM

## 2016-06-12 DIAGNOSIS — R200 Anesthesia of skin: Secondary | ICD-10-CM

## 2016-06-12 MED ORDER — TOPIRAMATE 25 MG PO TABS: 25.0000 mg | ORAL_TABLET | Freq: Two times a day (BID) | ORAL | 3 refills | Status: DC
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
## 2016-06-12 NOTE — Patient Instructions (Addendum)
I had a long discussion with the patient with her recurrent right arm numbness and tingling as well as neck pain which may be from cervical radiculopathy. Recommend checking EMG nerve conduction study as well as MRI scan cervical spine. Start Topamax 25 mg twice daily and if tolerated may increase it later. Continue gabapentin 303 times daily. Continue aspirin for stroke prevention and strict control of hypertension with blood pressure goal below 130/90, lipids with LDL cholesterol goal below 70 mg percent and have a healthy diet and maintain good physical activity and ideal body weight. She'll return for follow-up in 3 months or call earlier if necessary  Cervical Radiculopathy Cervical radiculopathy means that a nerve in the neck is pinched or bruised. This can cause pain or loss of feeling (numbness) that runs from your neck to your arm and fingers. HOME CARE Managing Pain  Take over-the-counter and prescription medicines only as told by your doctor.  If directed, put ice on the injured or painful area.  Put ice in a plastic bag.  Place a towel between your skin and the bag.  Leave the ice on for 20 minutes, 2-3 times per day.  If ice does not help, you can try using heat. Take a warm shower or warm bath, or use a heat pack as told by your doctor.  You may try a gentle neck and shoulder massage. Activity  Rest as needed. Follow instructions from your doctor about any activities to avoid.  Do exercises as told by your doctor or physical therapist. General Instructions   If you were given a soft collar, wear it as told by your doctor.  Use a flat pillow when you sleep.  Keep all follow-up visits as told by your doctor. This is important. GET HELP IF:  Your condition does not improve with treatment. GET HELP RIGHT AWAY IF:   Your pain gets worse and is not controlled with medicine.  You lose feeling or feel weak in your hand, arm, face, or leg.  You have a fever.  You have  a stiff neck.  You cannot control when you poop or pee (have incontinence).  You have trouble with walking, balance, or talking.   This information is not intended to replace advice given to you by your health care provider. Make sure you discuss any questions you have with your health care provider.   Document Released: 09/18/2011 Document Revised: 06/20/2015 Document Reviewed: 11/23/2014 Elsevier Interactive Patient Education [employer].

## 2016-06-30 ENCOUNTER — Encounter: Payer: Self-pay | Admitting: Registered Nurse

## 2016-06-30 ENCOUNTER — Encounter: Payer: Medicare Other | Attending: Physical Medicine & Rehabilitation | Admitting: Registered Nurse

## 2016-06-30 ENCOUNTER — Telehealth: Payer: Self-pay | Admitting: Physical Medicine & Rehabilitation

## 2016-06-30 VITALS — BP 113/80 | HR 83

## 2016-06-30 DIAGNOSIS — I69398 Other sequelae of cerebral infarction: Secondary | ICD-10-CM | POA: Insufficient documentation

## 2016-06-30 DIAGNOSIS — M25561 Pain in right knee: Secondary | ICD-10-CM | POA: Diagnosis not present

## 2016-06-30 DIAGNOSIS — M792 Neuralgia and neuritis, unspecified: Secondary | ICD-10-CM

## 2016-06-30 DIAGNOSIS — I6381 Other cerebral infarction due to occlusion or stenosis of small artery: Secondary | ICD-10-CM

## 2016-06-30 DIAGNOSIS — I639 Cerebral infarction, unspecified: Secondary | ICD-10-CM | POA: Diagnosis not present

## 2016-06-30 DIAGNOSIS — M479 Spondylosis, unspecified: Secondary | ICD-10-CM | POA: Insufficient documentation

## 2016-06-30 DIAGNOSIS — M5136 Other intervertebral disc degeneration, lumbar region: Secondary | ICD-10-CM | POA: Insufficient documentation

## 2016-06-30 DIAGNOSIS — Z79899 Other long term (current) drug therapy: Secondary | ICD-10-CM

## 2016-06-30 DIAGNOSIS — Z5181 Encounter for therapeutic drug level monitoring: Secondary | ICD-10-CM | POA: Diagnosis not present

## 2016-06-30 DIAGNOSIS — M545 Low back pain: Secondary | ICD-10-CM | POA: Diagnosis not present

## 2016-06-30 DIAGNOSIS — M47816 Spondylosis without myelopathy or radiculopathy, lumbar region: Secondary | ICD-10-CM

## 2016-06-30 DIAGNOSIS — G894 Chronic pain syndrome: Secondary | ICD-10-CM | POA: Diagnosis not present

## 2016-06-30 DIAGNOSIS — H53462 Homonymous bilateral field defects, left side: Secondary | ICD-10-CM | POA: Diagnosis not present

## 2016-06-30 DIAGNOSIS — M129 Arthropathy, unspecified: Secondary | ICD-10-CM | POA: Insufficient documentation

## 2016-06-30 MED ORDER — HYDROCODONE-ACETAMINOPHEN 7.5-325 MG PO TABS: 1.0000 | ORAL_TABLET | Freq: Three times a day (TID) | ORAL | 0 refills | Status: DC | PRN

## 2016-06-30 NOTE — Telephone Encounter (Signed)
Patient returned Brittany Crawford's phone call. 

## 2016-06-30 NOTE — Telephone Encounter (Signed)
Return Ms. Salvetti call, all questions answered.

## 2016-06-30 NOTE — Progress Notes (Signed)
Subjective:    Patient ID: Brittany Crawford, female    DOB: 08-Nov-1955, 60 y.o.   MRN: [id number]  HPI: [name] is a 60 year old female who returns for follow up for chronic pain and medication refill. She states her pain is located in her lower back. She rates her pain 5. Her current exercise regime is using her Nordic track,walking and performing stretching exercises.   Brittany Crawford states she was experiencing right arm  tingling with numbness pain, she seen Dr. Leonie Man on 06/12/16, he prescribed Topamax 25 mg BID and was told to continue her gabapentin. Brittany Crawford states she weaned herself off the gabapentin due to drowsiness. Instructed to call Dr. Leonie Man to informed him of the above she verbalizes understanding.   Pain Inventory Average Pain 7 Pain Right Now 5 My pain is intermittent and aching  In the last 24 hours, has pain interfered with the following? General activity 8 Relation with others 8 Enjoyment of life 7 What TIME of day is your pain at its worst? evening Sleep (in general) Good  Pain is worse with: bending and standing Pain improves with: heat/ice and medication Relief from Meds: 10  Mobility walk without assistance how many minutes can you walk? 20 ability to climb steps?  yes do you drive?  no  Function disabled: date disabled 2014 I need assistance with the following:  meal prep and household duties  Neuro/Psych No problems in this area  Prior Studies Any changes since last visit?  no  Physicians involved in your care Neurologist right arm numbness   Family History  Problem Relation Age of Onset  . Colon cancer Maternal Uncle   . Stroke Mother    Social History   Social History  . Marital status: Married    Spouse name: N/A  . Number of children: 2  . Years of education: 68   Social History Main Topics  . Smoking status: Current Every Day Smoker    Packs/day: 0.50    Years: 0.00    Types: Cigarettes    Last attempt to  quit: 10/15/2015  . Smokeless tobacco: Never Used     Comment: 4  to 5  cigarettes daily for years  . Alcohol use 0.6 oz/week    1 Glasses of wine per week     Comment: occasionaly   . Drug use: No  . Sexual activity: Yes   Other Topics Concern  . None   Social History Narrative   Patient is married with 2 children.   Patient is right handed.   Patient has hs education.   Patient drinks 4 cups daily.   Past Surgical History:  Procedure Laterality Date  . DILATATION & CURRETTAGE/HYSTEROSCOPY WITH RESECTOCOPE N/A 12/10/2012   Procedure: DILATATION & CURETTAGE/HYSTEROSCOPY WITH RESECTOCOPE;  Surgeon: Marvene Staff, MD;  Location: [facility];  Service: Gynecology;  Laterality: N/A;  . FOOT SURGERY     left-pins placed  . LYMPH NODE BIOPSY    . POLYPECTOMY N/A 12/10/2012   Procedure: POLYPECTOMY;  Surgeon: Marvene Staff, MD;  Location: [facility];  Service: Gynecology;  Laterality: N/A;   Past Medical History:  Diagnosis Date  . Chronic back pain   . H/O sarcoidosis   . Hypertension   . Seasonal allergies   . Stroke ([place])    BP 113/80   Pulse 83   LMP 11/13/2012   SpO2 98%   Opioid Risk Score:   Fall Risk Score:  `  1  Depression screen PHQ 2/9  Depression screen [facility] 2/9 04/28/2016 11/22/2015 06/26/2015 05/15/2015 01/02/2015  Decreased Interest 0 0 0 0 0  Down, Depressed, Hopeless 0 0 0 0 0  PHQ - 2 Score 0 0 0 0 0  Altered sleeping - - - - 0  Tired, decreased energy - - - - 0  Change in appetite - - - - 0  Feeling bad or failure about yourself  - - - - 0  Trouble concentrating - - - - 0  Moving slowly or fidgety/restless - - - - 1  Suicidal thoughts - - - - 0  PHQ-9 Score - - - - 1     Review of Systems  Constitutional: Negative.   HENT: Negative.   Eyes: Negative.   Respiratory: Negative.   Cardiovascular: Negative.   Gastrointestinal: Negative.   Endocrine: Negative.   Genitourinary: Negative.   Musculoskeletal: Negative.   Skin: Negative.     Allergic/Immunologic: Negative.   Neurological: Positive for numbness.  Hematological: Negative.   Psychiatric/Behavioral: Negative.        Objective:   Physical Exam  Constitutional: She is oriented to person, place, and time. She appears well-developed and well-nourished.  HENT:  Head: Normocephalic and atraumatic.  Neck: Normal range of motion. Neck supple.  Cardiovascular: Normal rate and regular rhythm.   Pulmonary/Chest: Effort normal and breath sounds normal.  Musculoskeletal:  Normal Muscle Bulk and Muscle Testing Reveals: Upper Extremities: Full ROM and Muscle Strength 5/5 Lumbar Paraspinal Tenderness: L-3- L-5 Lower Extremities: Full ROM and Muscle Strength 5/5 Arises from Table with Ease  Narrow Based Gait  Neurological: She is alert and oriented to person, place, and time.  Skin: Skin is warm and dry.  Psychiatric: She has a normal mood and affect.  Nursing note and vitals reviewed.         Assessment & Plan:  1. Lumbar spondylosis with DDD and facet arthropathy Refilled: Hydrocodone 7.5 /325 mg one tablet every 8 hours as needed for pain #85. Second script given for the following month. We will continue the opioid monitoring program, this consists of regular clinic visits, examinations, urine drug screen, pill counts as well as use of [place] Controlled Substance Reporting System. 2. Bilateral knee pain: No complaints voiced today: Continue with heat/ice, exercise and Voltaren Gel. Alternate with Mobic 3. Right thalamic/internal capsule lacunar infarct with persistent left hemisensory deficits: Continue to Monitor  20 minutes of face to face patient care time was spent during this visit. All questions were encouraged and answered.  F/U in 1 month

## 2016-07-23 ENCOUNTER — Encounter: Payer: Self-pay | Admitting: Neurology

## 2016-07-23 ENCOUNTER — Ambulatory Visit (INDEPENDENT_AMBULATORY_CARE_PROVIDER_SITE_OTHER): Payer: Self-pay | Admitting: Neurology

## 2016-07-23 ENCOUNTER — Ambulatory Visit (INDEPENDENT_AMBULATORY_CARE_PROVIDER_SITE_OTHER): Payer: Medicare Other | Admitting: Neurology

## 2016-07-23 DIAGNOSIS — R202 Paresthesia of skin: Secondary | ICD-10-CM | POA: Diagnosis not present

## 2016-07-23 DIAGNOSIS — G894 Chronic pain syndrome: Secondary | ICD-10-CM

## 2016-07-23 DIAGNOSIS — R200 Anesthesia of skin: Secondary | ICD-10-CM

## 2016-07-23 NOTE — Progress Notes (Signed)
Please refer to EMG and nerve conduction study procedure note. 

## 2016-07-23 NOTE — Procedures (Signed)
     HISTORY:  Brittany Crawford is a 60 year old patient with a history of some left-sided neck pain, some dysesthesias and weakness involving the right arm. The patient is being evaluated for this issue.  NERVE CONDUCTION STUDIES:  Nerve conduction studies were performed on both upper extremities. The distal motor latencies and motor amplitudes for the median and ulnar nerves were within normal limits. The F wave latencies and nerve conduction velocities for these nerves were also normal. The sensory latencies for the median and ulnar nerves were normal.   EMG STUDIES:  EMG study was performed on the right upper extremity:  The first dorsal interosseous muscle reveals 2 to 4 K units with full recruitment. No fibrillations or positive waves were noted. The abductor pollicis brevis muscle reveals 2 to 4 K units with full recruitment. No fibrillations or positive waves were noted. The extensor indicis proprius muscle reveals 1 to 3 K units with full recruitment. No fibrillations or positive waves were noted. The pronator teres muscle reveals 2 to 3 K units with full recruitment. No fibrillations or positive waves were noted. The biceps muscle reveals 1 to 2 K units with full recruitment. No fibrillations or positive waves were noted. The triceps muscle reveals 2 to 4 K units with full recruitment. No fibrillations or positive waves were noted. The anterior deltoid muscle reveals 2 to 3 K units with full recruitment. No fibrillations or positive waves were noted. The cervical paraspinal muscles were tested at 2 levels. No abnormalities of insertional activity were seen at either level tested. There was fair relaxation.   IMPRESSION:  Nerve conduction studies done on both upper extremities were within normal limits. No evidence of a neuropathy is seen. EMG evaluation of the right upper extremity was unremarkable, without evidence of an overlying cervical radiculopathy.  Jill Alexanders  MD 07/23/2016 4:51 PM  [facility] [address] [place], [place] [address]  Phone [phone] Fax [phone]

## 2016-07-28 ENCOUNTER — Telehealth: Payer: Self-pay

## 2016-07-28 NOTE — Telephone Encounter (Signed)
LFT vm for patient to call back about EMG results.

## 2016-07-28 NOTE — Telephone Encounter (Signed)
Rn call patient that her EMG was normal. Pt verbalized understanding. Pt stated she Dr. Leonie Man wanted her to have a MRI cervical spine. PT ask if she needed the test. Rn stated DR.Leonie Man put the test in under her plan of care because of neck pain. Pt states she is feeling better now. Pt wanted to have the test at [facility] stated all outpatient MRI are schedule at [facility], [place] etc Rn stated she is not in the hospital that's why its schedule at outpatient radiology offices.. Rn stated her test orders was approve and sent to GI. PT stated she does not think she needs it. Rn stated if she wants it to call [facility]. Pt verbalized understanding.

## 2016-07-28 NOTE — Telephone Encounter (Signed)
Pt returned call

## 2016-08-27 ENCOUNTER — Encounter: Payer: Self-pay | Admitting: Physical Medicine & Rehabilitation

## 2016-08-27 ENCOUNTER — Encounter: Payer: Medicare Other | Attending: Physical Medicine & Rehabilitation | Admitting: Physical Medicine & Rehabilitation

## 2016-08-27 VITALS — BP 137/82 | HR 75 | Resp 14

## 2016-08-27 DIAGNOSIS — M25561 Pain in right knee: Secondary | ICD-10-CM | POA: Diagnosis not present

## 2016-08-27 DIAGNOSIS — Z5181 Encounter for therapeutic drug level monitoring: Secondary | ICD-10-CM | POA: Diagnosis not present

## 2016-08-27 DIAGNOSIS — I69398 Other sequelae of cerebral infarction: Secondary | ICD-10-CM | POA: Diagnosis not present

## 2016-08-27 DIAGNOSIS — M1288 Other specific arthropathies, not elsewhere classified, other specified site: Secondary | ICD-10-CM

## 2016-08-27 DIAGNOSIS — M545 Low back pain: Secondary | ICD-10-CM | POA: Insufficient documentation

## 2016-08-27 DIAGNOSIS — H53462 Homonymous bilateral field defects, left side: Secondary | ICD-10-CM | POA: Diagnosis not present

## 2016-08-27 DIAGNOSIS — I639 Cerebral infarction, unspecified: Secondary | ICD-10-CM

## 2016-08-27 DIAGNOSIS — M479 Spondylosis, unspecified: Secondary | ICD-10-CM | POA: Diagnosis not present

## 2016-08-27 DIAGNOSIS — M129 Arthropathy, unspecified: Secondary | ICD-10-CM | POA: Diagnosis not present

## 2016-08-27 DIAGNOSIS — M5136 Other intervertebral disc degeneration, lumbar region: Secondary | ICD-10-CM | POA: Diagnosis not present

## 2016-08-27 DIAGNOSIS — M47816 Spondylosis without myelopathy or radiculopathy, lumbar region: Secondary | ICD-10-CM

## 2016-08-27 MED ORDER — HYDROCODONE-ACETAMINOPHEN 7.5-325 MG PO TABS: 1.0000 | ORAL_TABLET | Freq: Three times a day (TID) | ORAL | 0 refills | Status: DC | PRN

## 2016-08-27 NOTE — Addendum Note (Signed)
Addended by: Elinor Parkinson I on: 08/27/2016 03:24 PM   Modules accepted: Orders

## 2016-08-27 NOTE — Progress Notes (Signed)
Subjective:    Patient ID: Brittany Crawford, female    DOB: Jun 04, 1956, 60 y.o.   MRN: [id number]  HPI   Brittany Crawford is here in follow up of her chronic pain. She has had a "rough few days" with more stiffness---perhaps from the colder weather. She used a hot pack which really helps her pain.   She had a normal NCS/EMG last month which was negative. It was done due to right arm numbness/tingling. She thinks in hindsight that the tingling came from coming off the gabapentin---she stopped it due to the fact that it was "wiping her out." At this point she feels that her arms and legs are back to normal. Sometimes her neck gives her problems.   For pain she's using meloxicam prn  (twice weekly). She is using hydrocodone TID most days. She uses the hydrocodone for her low back.      Pain Inventory Average Pain 8 Pain Right Now n/a My pain is n/a  In the last 24 hours, has pain interfered with the following? General activity 6 Relation with others 6 Enjoyment of life 3 What TIME of day is your pain at its worst? evening Sleep (in general) n/a  Pain is worse with: walking, bending, sitting and standing Pain improves with: rest, heat/ice and medication Relief from Meds: 9  Mobility ability to climb steps?  yes do you drive?  no Do you have any goals in this area?  yes  Function disabled: date disabled n/a  Neuro/Psych No problems in this area  Prior Studies Any changes since last visit?  no  Physicians involved in your care Any changes since last visit?  no   Family History  Problem Relation Age of Onset  . Colon cancer Maternal Uncle   . Stroke Mother    Social History   Social History  . Marital status: Married    Spouse name: N/A  . Number of children: 2  . Years of education: 47   Social History Main Topics  . Smoking status: Current Every Day Smoker    Packs/day: 0.50    Years: 0.00    Types: Cigarettes    Last attempt to quit: 10/15/2015  . Smokeless  tobacco: Never Used     Comment: 4  to 5  cigarettes daily for years  . Alcohol use 0.6 oz/week    1 Glasses of wine per week     Comment: occasionaly   . Drug use: No  . Sexual activity: Yes   Other Topics Concern  . None   Social History Narrative   Patient is married with 2 children.   Patient is right handed.   Patient has hs education.   Patient drinks 4 cups daily.   Past Surgical History:  Procedure Laterality Date  . DILATATION & CURRETTAGE/HYSTEROSCOPY WITH RESECTOCOPE N/A 12/10/2012   Procedure: DILATATION & CURETTAGE/HYSTEROSCOPY WITH RESECTOCOPE;  Surgeon: Marvene Staff, MD;  Location: [facility];  Service: Gynecology;  Laterality: N/A;  . FOOT SURGERY     left-pins placed  . LYMPH NODE BIOPSY    . POLYPECTOMY N/A 12/10/2012   Procedure: POLYPECTOMY;  Surgeon: Marvene Staff, MD;  Location: [facility];  Service: Gynecology;  Laterality: N/A;   Past Medical History:  Diagnosis Date  . Chronic back pain   . H/O sarcoidosis   . Hypertension   . Seasonal allergies   . Stroke (HCC)    BP 137/82   Pulse 75   Resp 14  LMP 11/13/2012   SpO2 95%   Opioid Risk Score:   Fall Risk Score:  `1  Depression screen PHQ 2/9  Depression screen [facility] 2/9 04/28/2016 11/22/2015 06/26/2015 05/15/2015 01/02/2015  Decreased Interest 0 0 0 0 0  Down, Depressed, Hopeless 0 0 0 0 0  PHQ - 2 Score 0 0 0 0 0  Altered sleeping - - - - 0  Tired, decreased energy - - - - 0  Change in appetite - - - - 0  Feeling bad or failure about yourself  - - - - 0  Trouble concentrating - - - - 0  Moving slowly or fidgety/restless - - - - 1  Suicidal thoughts - - - - 0  PHQ-9 Score - - - - 1     Review of Systems  Constitutional: Negative.   HENT: Negative.   Eyes: Negative.   Respiratory: Negative.   Cardiovascular: Negative.   Gastrointestinal: Negative.   Endocrine: Negative.   Genitourinary: Negative.   Musculoskeletal: Negative.   Skin: Negative.   Allergic/Immunologic:  Negative.   Neurological: Negative.   Hematological: Negative.   Psychiatric/Behavioral: Negative.   All other systems reviewed and are negative.      Objective:   Physical Exam  General: Alert and oriented x 3, No apparent distress  HEENT: Head is normocephalic, atraumatic, PERRLA, EOMI, sclera anicteric, oral mucosa pink and moist, dentition intact, ext ear canals clear,  Neck: no jvd Heart: rrr  Chest: cta  Abdomen: Soft, non-tender, non-distended, bowel sounds positive.  Extremities: No clubbing, cyanosis, or edema. Pulses are 2+  Skin: Clean and intact without signs of breakdown  Neuro:  imporoved balance. Cognitively intact No obvious facial weakness. Sensory exam is normal on right and 1+ on left face, arm, leg Reflexes are 1+ in all 4's. Fine motor coordination is intact. No tremors. Motor function is grossly 5/5 in all 4s. No resting tone on the left.  Musculoskeletal: CONTINUED pain along L4-5 and L5-S1 levels worse with extension and bilateral facet maneuvers still. Her lumbar flexion is better---can touch toes. She doesn't seem to walk with any gross antalgia on either side. SLR and SST negative. No obvious scoliosis.  Psych: Pt's affect is pleasant  Assessment & Plan:   1. Lumbar spondylosis with DDD and facet arthropathy. Sx appear most prominent at L5-S1 on exam  2. Right knee pain---appears minimal on exam today  3. Right thalamic/internal capsule lacunar infarct with persistent left hemisensory deficits and pain syndrome.  4. ?old BI as youth   5. Hx of marijuana use.    Plan:  1. Continue meloxicam daily prn 2. Consider lumbar medial branch blocks bilaterally at L4-5 and L5-S1. She would like to think about it.   3. Hydrocodone 5/325 one q8 prn for breakthrough pain---reduce to #75 4. Continue with HEP.   5. Reviewed appropriate stretches again.  6. Meloxicam prn  7. Follow up with  NP in 1 months.15 minutes of face to face patient care time were spent  during this visit. All questions were encouraged and answered.

## 2016-08-27 NOTE — Patient Instructions (Signed)
PLEASE CALL ME WITH ANY PROBLEMS OR QUESTIONS ([phone])  

## 2016-09-02 LAB — TOXASSURE SELECT,+ANTIDEPR,UR

## 2016-09-16 ENCOUNTER — Ambulatory Visit: Payer: Medicare Other | Admitting: Neurology

## 2016-10-01 ENCOUNTER — Ambulatory Visit (HOSPITAL_COMMUNITY)
Admission: EM | Admit: 2016-10-01 | Discharge: 2016-10-01 | Disposition: A | Discharge status: Home / Self Care | Payer: Medicare Other | Attending: Emergency Medicine | Admitting: Emergency Medicine

## 2016-10-01 ENCOUNTER — Encounter (HOSPITAL_COMMUNITY): Payer: Self-pay | Admitting: Emergency Medicine

## 2016-10-01 DIAGNOSIS — J0140 Acute pansinusitis, unspecified: Secondary | ICD-10-CM | POA: Diagnosis not present

## 2016-10-01 MED ORDER — AMOXICILLIN-POT CLAVULANATE 875-125 MG PO TABS: 1.0000 | ORAL_TABLET | Freq: Two times a day (BID) | ORAL | 0 refills | Status: DC

## 2016-10-01 MED ORDER — PREDNISONE 50 MG PO TABS: ORAL_TABLET | ORAL | 0 refills | Status: DC

## 2016-10-01 NOTE — ED Provider Notes (Signed)
[facility]    CSN: [id number] Arrival date & time: 10/01/16  1033     History   Chief Complaint Chief Complaint  Patient presents with  . Recurrent Sinusitis    HPI Brittany Crawford is a 60 y.o. female.   HPI  She is a 60 year old woman here for evaluation of sinus pressure. She reports a two-week history of nasal congestion, runny nose, ear pain, and sinus pressure. Symptoms have fluctuated some, but have worsened over the last several days. She has not developed hot and cold chills at night. She reports a very mild cough. No nausea or vomiting. It has been hard to sleep due to the sinus pressure. She has been using Flonase and over-the-counter allergy medication. Initially, this did seem to help some, but not anymore.  Past Medical History:  Diagnosis Date  . Chronic back pain   . H/O sarcoidosis   . Hypertension   . Seasonal allergies   . Stroke [facility])     Patient Active Problem List   Diagnosis Date Noted  . Neuropathic pain 10/03/2015  . Chronic pain syndrome 08/03/2015  . Right-sided lacunar infarction ([place]) 12/04/2014  . Spondylosis of lumbar region without myelopathy or radiculopathy 12/04/2014  . Lumbar facet arthropathy 12/04/2014  . Marijuana abuse 07/07/2014  . Other and unspecified hyperlipidemia 07/07/2014  . CVA (cerebral infarction) 07/06/2014  . Essential hypertension, benign 07/06/2014    Past Surgical History:  Procedure Laterality Date  . DILATATION & CURRETTAGE/HYSTEROSCOPY WITH RESECTOCOPE N/A 12/10/2012   Procedure: DILATATION & CURETTAGE/HYSTEROSCOPY WITH RESECTOCOPE;  Surgeon: Marvene Staff, MD;  Location: [facility];  Service: Gynecology;  Laterality: N/A;  . FOOT SURGERY     left-pins placed  . LYMPH NODE BIOPSY    . POLYPECTOMY N/A 12/10/2012   Procedure: POLYPECTOMY;  Surgeon: Marvene Staff, MD;  Location: [facility];  Service: Gynecology;  Laterality: N/A;    OB History    Gravida Para Term Preterm AB Living   5 2  2   3 2    SAB TAB Ectopic Multiple Live Births     3             Home Medications    Prior to Admission medications   Medication Sig Start Date End Date Taking? Authorizing Provider  amLODipine (NORVASC) 5 MG tablet Take 1 tablet (5 mg total) by mouth daily. 12/12/13  Yes Harden Mo, MD  aspirin 325 MG tablet Take 1 tablet (325 mg total) by mouth daily. 07/07/14  Yes Bonnielee Haff, MD  cetirizine (ZYRTEC) 10 MG tablet Take 10 mg by mouth daily as needed for allergies.   Yes Historical Provider, MD  diclofenac sodium (VOLTAREN) 1 % GEL Apply 2 g topically QID. Use as directed on right  hip and  Right knee 04/28/16  Yes Bayard Hugger, NP  HYDROcodone-acetaminophen (NORCO) 7.5-325 MG tablet Take 1 tablet by mouth every 8 (eight) hours as needed for moderate pain. 08/27/16  Yes Meredith Staggers, MD  Hypromellose (ARTIFICIAL TEARS OP) Place 1 drop into the right eye daily as needed (for dry eyes).   Yes Historical Provider, MD  Multiple Vitamins-Minerals (WOMENS ONE DAILY PO) Take 1 tablet by mouth daily.   Yes Historical Provider, MD  prednisoLONE acetate (PRED FORTE) 1 % ophthalmic suspension Place 1 drop into the left eye as needed (flare ups).    Yes Historical Provider, MD  valsartan-hydrochlorothiazide (DIOVAN-HCT) 160-12.5 MG per tablet Take 1 tablet by mouth daily. 03/14/15  Yes Historical Provider, MD  amoxicillin-clavulanate (AUGMENTIN) 875-125 MG tablet Take 1 tablet by mouth 2 (two) times daily. 10/01/16   Melony Overly, MD  atorvastatin (LIPITOR) 10 MG tablet Take 1 tablet (10 mg total) by mouth daily at 6 PM. 07/07/14   Bonnielee Haff, MD  meloxicam (MOBIC) 15 MG tablet Take 15 mg by mouth daily as needed for pain.     Historical Provider, MD  predniSONE (DELTASONE) 50 MG tablet Take 1 pill daily for 5 days. 10/01/16   Melony Overly, MD  triamcinolone cream (KENALOG) 0.1 % Apply 1 application topically at bedtime as needed (for rash). 07/08/14   Bonnielee Haff, MD    Family  History Family History  Problem Relation Age of Onset  . Colon cancer Maternal Uncle   . Stroke Mother     Social History Social History  Substance Use Topics  . Smoking status: Current Every Day Smoker    Packs/day: 0.25    Years: 0.00    Types: Cigarettes    Last attempt to quit: 10/15/2015  . Smokeless tobacco: Never Used     Comment: 4  to 5  cigarettes daily for years  . Alcohol use 0.6 oz/week    1 Glasses of wine per week     Comment: occasionaly      Allergies   Patient has no known allergies.   Review of Systems Review of Systems As in history of present illness  Physical Exam Triage Vital Signs ED Triage Vitals [10/01/16 1052]  Enc Vitals Group     BP 121/84     Pulse Rate 81     Resp      Temp 99.4 F (37.4 C)     Temp Source Oral     SpO2 96 %     Weight      Height      Head Circumference      Peak Flow      Pain Score 4     Pain Loc      Pain Edu?      Excl. in [place]?    No data found.   Updated Vital Signs BP 121/84 (BP Location: Right Arm)   Pulse 81   Temp 99.4 F (37.4 C) (Oral)   LMP 11/13/2012   SpO2 96%   Visual Acuity Right Eye Distance:   Left Eye Distance:   Bilateral Distance:    Right Eye Near:   Left Eye Near:    Bilateral Near:     Physical Exam  Constitutional: She is oriented to person, place, and time. She appears well-developed and well-nourished. No distress.  HENT:  Mouth/Throat: No oropharyngeal exudate.  TMs normal bilaterally. Nasal mucosa slightly inflamed. Diffuse sinus tenderness.  Neck: Neck supple.  Cardiovascular: Normal rate.   Pulmonary/Chest: Effort normal.  Lymphadenopathy:    She has no cervical adenopathy.  Neurological: She is alert and oriented to person, place, and time.     UC Treatments / Results  Labs (all labs ordered are listed, but only abnormal results are displayed) Labs Reviewed - No data to display  EKG  EKG Interpretation None       Radiology No results  found.  Procedures Procedures (including critical care time)  Medications Ordered in UC Medications - No data to display   Initial Impression / Assessment and Plan / UC Course  I have reviewed the triage vital signs and the nursing notes.  Pertinent labs & imaging results that  were available during my care of the patient were reviewed by me and considered in my medical decision making (see chart for details).  Clinical Course     Treat with Augmentin and prednisone. Continue Flonase and nasal saline spray. Follow-up as needed.  Final Clinical Impressions(s) / UC Diagnoses   Final diagnoses:  Acute non-recurrent pansinusitis    New Prescriptions New Prescriptions   AMOXICILLIN-CLAVULANATE (AUGMENTIN) 875-125 MG TABLET    Take 1 tablet by mouth 2 (two) times daily.   PREDNISONE (DELTASONE) 50 MG TABLET    Take 1 pill daily for 5 days.     Melony Overly, MD 10/01/16 [phone]

## 2016-10-01 NOTE — Discharge Instructions (Signed)
You have a sinus infection. Take Augmentin twice a day for 10 days. Take prednisone daily for 5 days. Continue the Flonase and nasal saline spray. It is okay to take a little bit of Tylenol or ibuprofen for the next 2 days to help with the sinus headache. Follow-up as needed.

## 2016-10-01 NOTE — ED Triage Notes (Signed)
Pt has been suffering from sinus congestion and pain, bilateral ear pain and jaw pain for about two weeks.  She also reports chills at night.  She has been taking OTC allergy medication and nasal sprays with little relief.

## 2016-10-15 ENCOUNTER — Encounter: Payer: Medicare Other | Attending: Physical Medicine & Rehabilitation | Admitting: Physical Medicine & Rehabilitation

## 2016-10-15 ENCOUNTER — Encounter: Payer: Self-pay | Admitting: Physical Medicine & Rehabilitation

## 2016-10-15 DIAGNOSIS — M1288 Other specific arthropathies, not elsewhere classified, other specified site: Secondary | ICD-10-CM | POA: Diagnosis not present

## 2016-10-15 DIAGNOSIS — H53462 Homonymous bilateral field defects, left side: Secondary | ICD-10-CM | POA: Diagnosis not present

## 2016-10-15 DIAGNOSIS — M479 Spondylosis, unspecified: Secondary | ICD-10-CM | POA: Insufficient documentation

## 2016-10-15 DIAGNOSIS — M129 Arthropathy, unspecified: Secondary | ICD-10-CM | POA: Diagnosis not present

## 2016-10-15 DIAGNOSIS — M47816 Spondylosis without myelopathy or radiculopathy, lumbar region: Secondary | ICD-10-CM

## 2016-10-15 DIAGNOSIS — M5136 Other intervertebral disc degeneration, lumbar region: Secondary | ICD-10-CM | POA: Insufficient documentation

## 2016-10-15 DIAGNOSIS — I69398 Other sequelae of cerebral infarction: Secondary | ICD-10-CM | POA: Diagnosis not present

## 2016-10-15 DIAGNOSIS — M545 Low back pain: Secondary | ICD-10-CM | POA: Diagnosis not present

## 2016-10-15 DIAGNOSIS — M25561 Pain in right knee: Secondary | ICD-10-CM | POA: Diagnosis not present

## 2016-10-15 MED ORDER — HYDROCODONE-ACETAMINOPHEN 7.5-325 MG PO TABS: 1.0000 | ORAL_TABLET | Freq: Three times a day (TID) | ORAL | 0 refills | Status: DC | PRN

## 2016-10-15 NOTE — Patient Instructions (Signed)
PLEASE CALL ME WITH ANY PROBLEMS OR QUESTIONS ([phone])  

## 2016-10-15 NOTE — Progress Notes (Signed)
Subjective:    Patient ID: Brittany Crawford, female    DOB: Feb 20, 1956, 61 y.o.   MRN: [id number]  HPI  Brittany Crawford is here in follow up of her chronic pain. She has had more pain with the cold weather but has been maintaining with her stretching and meds. She brought her records from [facility]. Dr. Brien Few performed an L5-S1 ESI as well as MBB's at L4-5 and L5-S1. She doesn't recall getting any relief from these   Pain Inventory Average Pain 5 Pain Right Now 5 My pain is dull and aching  In the last 24 hours, has pain interfered with the following? General activity 4 Relation with others 7 Enjoyment of life 8 What TIME of day is your pain at its worst? evening Sleep (in general) Fair  Pain is worse with: walking, bending, standing and some activites Pain improves with: rest, heat/ice and medication Relief from Meds: 7  Mobility walk without assistance ability to climb steps?  yes do you drive?  no Do you have any goals in this area?  yes  Function disabled: date disabled . I need assistance with the following:  household duties and shopping  Neuro/Psych trouble walking  Prior Studies Any changes since last visit?  no  Physicians involved in your care Any changes since last visit?  no   Family History  Problem Relation Age of Onset  . Colon cancer Maternal Uncle   . Stroke Mother    Social History   Social History  . Marital status: Married    Spouse name: N/A  . Number of children: 2  . Years of education: 13   Social History Main Topics  . Smoking status: Current Every Day Smoker    Packs/day: 0.25    Years: 0.00    Types: Cigarettes    Last attempt to quit: 10/15/2015  . Smokeless tobacco: Never Used     Comment: 4  to 5  cigarettes daily for years  . Alcohol use 0.6 oz/week    1 Glasses of wine per week     Comment: occasionaly   . Drug use: No  . Sexual activity: Yes   Other Topics Concern  . Not on file   Social History Narrative   Patient  is married with 2 children.   Patient is right handed.   Patient has hs education.   Patient drinks 4 cups daily.   Past Surgical History:  Procedure Laterality Date  . DILATATION & CURRETTAGE/HYSTEROSCOPY WITH RESECTOCOPE N/A 12/10/2012   Procedure: DILATATION & CURETTAGE/HYSTEROSCOPY WITH RESECTOCOPE;  Surgeon: Marvene Staff, MD;  Location: [facility];  Service: Gynecology;  Laterality: N/A;  . FOOT SURGERY     left-pins placed  . LYMPH NODE BIOPSY    . POLYPECTOMY N/A 12/10/2012   Procedure: POLYPECTOMY;  Surgeon: Marvene Staff, MD;  Location: [facility];  Service: Gynecology;  Laterality: N/A;   Past Medical History:  Diagnosis Date  . Chronic back pain   . H/O sarcoidosis   . Hypertension   . Seasonal allergies   . Stroke ([place])    LMP 11/13/2012   Opioid Risk Score:   Fall Risk Score:  `1  Depression screen PHQ 2/9  Depression screen [facility] 2/9 04/28/2016 11/22/2015 06/26/2015 05/15/2015 01/02/2015  Decreased Interest 0 0 0 0 0  Down, Depressed, Hopeless 0 0 0 0 0  PHQ - 2 Score 0 0 0 0 0  Altered sleeping - - - - 0  Tired, decreased  energy - - - - 0  Change in appetite - - - - 0  Feeling bad or failure about yourself  - - - - 0  Trouble concentrating - - - - 0  Moving slowly or fidgety/restless - - - - 1  Suicidal thoughts - - - - 0  PHQ-9 Score - - - - 1   Review of Systems  Constitutional: Negative.   HENT: Negative.   Eyes: Negative.   Respiratory: Negative.   Cardiovascular: Negative.   Gastrointestinal: Negative.   Endocrine: Negative.   Genitourinary: Negative.   Musculoskeletal: Positive for back pain.  Skin: Negative.   Allergic/Immunologic: Negative.   Neurological: Negative.   Hematological: Negative.   Psychiatric/Behavioral: Negative.   All other systems reviewed and are negative.      Objective:   Physical Exam  General: Alert and oriented x 3, No apparent distress  HEENT:Head is normocephalic, atraumatic, PERRLA, EOMI, sclera anicteric,  oral mucosa pink and moist, dentition intact, ext ear canals clear,  Neck:no jvd Heart:RRR Chest:CTA B Abdomen:Soft, non-tender, non-distended, bowel sounds positive.  Extremities:No clubbing, cyanosis, or edema. Pulses are 2+  Skin:Clean and intact without signs of breakdown  Neuro: imporoved balance. Cognitively intact No obvious facial weakness. Sensory exam is normal on right and 1+ on left face, arm, leg Reflexes are 1+ in all 4's. Fine motor coordination is intact. No tremors. Motor function is grossly 5/5 in all 4s. No resting tone on the left.  Musculoskeletal:moderate pain along L4-5 and L5-S1 levels worse with extension. Bilateral facet maneuvers positive. Her lumbar flexion is better---had difficulty touching  toes. She doesn't seem to walk with any gross antalgia on either side. SLR and SST negative. Marland Kitchen  Psych:Pt's affect is pleasant  Assessment & Plan:  1. Lumbar spondylosis with DDD and facet arthropathy. Sx appear most prominent at L5-S1 on exam  2. Right knee pain---appears minimal on exam today  3. Right thalamic/internal capsule lacunar infarct with persistent left hemisensory deficits and pain syndrome.  4. ?old BI as youth  5. Hx of marijuana use.    Plan:  1. Continue meloxicam daily prn 2. Consider lumbar medial branch blocks bilaterally at L4-5 and L5-S1. She is hesitant to pursue injections given responses in the past. .   3. Hydrocodone 5/325 one q8 prn for breakthrough pain---continue at #75 4. Continue with HEP.  5. Reviewed appropriate stretches that she needs to follow through on, especially flexion exercises.  6. Meloxicam prn  7. Follow up with  NP in 2 months.15 minutes of face to face patient care time were spent during this visit. All questions were encouraged and answered.

## 2016-11-14 DIAGNOSIS — H31002 Unspecified chorioretinal scars, left eye: Secondary | ICD-10-CM | POA: Diagnosis not present

## 2016-11-14 DIAGNOSIS — H5010 Unspecified exotropia: Secondary | ICD-10-CM | POA: Diagnosis not present

## 2016-11-14 DIAGNOSIS — H5213 Myopia, bilateral: Secondary | ICD-10-CM | POA: Diagnosis not present

## 2016-12-12 ENCOUNTER — Telehealth: Payer: Self-pay | Admitting: Registered Nurse

## 2016-12-12 ENCOUNTER — Encounter: Payer: Medicare Other | Attending: Physical Medicine & Rehabilitation | Admitting: Registered Nurse

## 2016-12-12 ENCOUNTER — Encounter: Payer: Self-pay | Admitting: Registered Nurse

## 2016-12-12 VITALS — BP 106/78 | HR 69

## 2016-12-12 DIAGNOSIS — M5136 Other intervertebral disc degeneration, lumbar region: Secondary | ICD-10-CM | POA: Diagnosis not present

## 2016-12-12 DIAGNOSIS — Z79899 Other long term (current) drug therapy: Secondary | ICD-10-CM

## 2016-12-12 DIAGNOSIS — M129 Arthropathy, unspecified: Secondary | ICD-10-CM | POA: Insufficient documentation

## 2016-12-12 DIAGNOSIS — M545 Low back pain: Secondary | ICD-10-CM | POA: Insufficient documentation

## 2016-12-12 DIAGNOSIS — M7061 Trochanteric bursitis, right hip: Secondary | ICD-10-CM | POA: Diagnosis not present

## 2016-12-12 DIAGNOSIS — M1288 Other specific arthropathies, not elsewhere classified, other specified site: Secondary | ICD-10-CM | POA: Diagnosis not present

## 2016-12-12 DIAGNOSIS — I69398 Other sequelae of cerebral infarction: Secondary | ICD-10-CM | POA: Diagnosis not present

## 2016-12-12 DIAGNOSIS — M479 Spondylosis, unspecified: Secondary | ICD-10-CM | POA: Insufficient documentation

## 2016-12-12 DIAGNOSIS — M47816 Spondylosis without myelopathy or radiculopathy, lumbar region: Secondary | ICD-10-CM | POA: Diagnosis not present

## 2016-12-12 DIAGNOSIS — H53462 Homonymous bilateral field defects, left side: Secondary | ICD-10-CM | POA: Diagnosis not present

## 2016-12-12 DIAGNOSIS — M7062 Trochanteric bursitis, left hip: Secondary | ICD-10-CM

## 2016-12-12 DIAGNOSIS — Z5181 Encounter for therapeutic drug level monitoring: Secondary | ICD-10-CM | POA: Diagnosis not present

## 2016-12-12 DIAGNOSIS — G894 Chronic pain syndrome: Secondary | ICD-10-CM | POA: Diagnosis not present

## 2016-12-12 DIAGNOSIS — M25561 Pain in right knee: Secondary | ICD-10-CM | POA: Diagnosis not present

## 2016-12-12 MED ORDER — DICLOFENAC SODIUM 1 % TD GEL: 2.0000 g | Freq: Four times a day (QID) | TRANSDERMAL | 4 refills | Status: DC

## 2016-12-12 MED ORDER — HYDROCODONE-ACETAMINOPHEN 7.5-325 MG PO TABS: 1.0000 | ORAL_TABLET | Freq: Three times a day (TID) | ORAL | 0 refills | Status: DC | PRN

## 2016-12-12 NOTE — Telephone Encounter (Signed)
On 12/13/2015 the [facility] [place] was reviewed no conflict was seen on the [place] with multiple prescribers. Brittany Crawford has a signed narcotic contract with our office. If there were any discrepancies this would have been reported to her physician.

## 2016-12-12 NOTE — Progress Notes (Signed)
Subjective:    Patient ID: Brittany Crawford, female    DOB: 1956-06-03, 61 y.o.   MRN: [id number]  HPI: Brittany Crawford is a 61 year old female who returns for follow up appointment for chronic pain and medication refill. She states her pain is located in her lower back, bilateral hips and left foot. She rates her pain 5. Her current exercise regime is using her Nordic track,walking and performing stretching exercises.   Pain Inventory Average Pain 5 Pain Right Now 5 My pain is n/a  In the last 24 hours, has pain interfered with the following? General activity 5 Relation with others 5 Enjoyment of life 7 What TIME of day is your pain at its worst? n/a Sleep (in general) NA  Pain is worse with: walking, bending and standing Pain improves with: rest, heat/ice, therapy/exercise and medication Relief from Meds: 10  Mobility Do you have any goals in this area?  yes  Function disabled: date disabled .  Neuro/Psych No problems in this area  Prior Studies Any changes since last visit?  no  Physicians involved in your care Any changes since last visit?  no   Family History  Problem Relation Age of Onset  . Colon cancer Maternal Uncle   . Stroke Mother    Social History   Social History  . Marital status: Married    Spouse name: N/A  . Number of children: 2  . Years of education: 22   Social History Main Topics  . Smoking status: Current Every Day Smoker    Packs/day: 0.25    Years: 0.00    Types: Cigarettes    Last attempt to quit: 10/15/2015  . Smokeless tobacco: Never Used     Comment: 4  to 5  cigarettes daily for years  . Alcohol use 0.6 oz/week    1 Glasses of wine per week     Comment: occasionaly   . Drug use: No  . Sexual activity: Yes   Other Topics Concern  . None   Social History Narrative   Patient is married with 2 children.   Patient is right handed.   Patient has hs education.   Patient drinks 4 cups daily.   Past Surgical  History:  Procedure Laterality Date  . DILATATION & CURRETTAGE/HYSTEROSCOPY WITH RESECTOCOPE N/A 12/10/2012   Procedure: DILATATION & CURETTAGE/HYSTEROSCOPY WITH RESECTOCOPE;  Surgeon: Marvene Staff, MD;  Location: [facility];  Service: Gynecology;  Laterality: N/A;  . FOOT SURGERY     left-pins placed  . LYMPH NODE BIOPSY    . POLYPECTOMY N/A 12/10/2012   Procedure: POLYPECTOMY;  Surgeon: Marvene Staff, MD;  Location: [facility];  Service: Gynecology;  Laterality: N/A;   Past Medical History:  Diagnosis Date  . Chronic back pain   . H/O sarcoidosis   . Hypertension   . Seasonal allergies   . Stroke ([place])    BP 106/78 (BP Location: Left Arm, Patient Position: Sitting, Cuff Size: Normal)   Pulse 69   LMP 11/13/2012   SpO2 96%   Opioid Risk Score:   Fall Risk Score:  `1  Depression screen PHQ 2/9  Depression screen [facility] 2/9 12/12/2016 04/28/2016 11/22/2015 06/26/2015 05/15/2015 01/02/2015  Decreased Interest 0 0 0 0 0 0  Down, Depressed, Hopeless 0 0 0 0 0 0  PHQ - 2 Score 0 0 0 0 0 0  Altered sleeping - - - - - 0  Tired, decreased energy - - - - -  0  Change in appetite - - - - - 0  Feeling bad or failure about yourself  - - - - - 0  Trouble concentrating - - - - - 0  Moving slowly or fidgety/restless - - - - - 1  Suicidal thoughts - - - - - 0  PHQ-9 Score - - - - - 1    Review of Systems  Constitutional: Negative.   HENT: Negative.   Eyes: Negative.   Respiratory: Negative.   Cardiovascular: Negative.   Gastrointestinal: Negative.   Endocrine: Negative.   Genitourinary: Negative.   Musculoskeletal: Negative.   Skin: Negative.   Neurological: Negative.   Hematological: Negative.   Psychiatric/Behavioral: Negative.   All other systems reviewed and are negative.      Objective:   Physical Exam  Constitutional: She is oriented to person, place, and time. She appears well-developed and well-nourished.  HENT:  Head: Normocephalic and atraumatic.  Neck: Normal range  of motion. Neck supple.  Cardiovascular: Normal rate and regular rhythm.   Pulmonary/Chest: Effort normal and breath sounds normal.  Musculoskeletal:  Normal Muscle Bulk and Muscle Testing Reveals: Upper Extremities: Full ROM and Muscle Strength 5/5 Lumbar Paraspinal Tenderness: L-3-L-5 Lower Extremities: Full ROM and Muscle Strength 5/5 Arises from Table with ease Narrow Based gait  Neurological: She is alert and oriented to person, place, and time.  Skin: Skin is warm and dry.  Psychiatric: She has a normal mood and affect.  Nursing note and vitals reviewed.         Assessment & Plan:  1. Lumbar spondylosis with DDD and facet arthropathy. 12/12/2016 Refilled:Hydrocodone 7.5 /325 mg one tablet every 8 hours as needed for pain #85. Second script given for the following month. We will continue the opioid monitoring program, this consists of regular clinic visits, examinations, urine drug screen, pill counts as well as use of [place] Controlled Substance Reporting System. 2. Bilateral knee pain: No complaints voiced today: Continue with heat/ice, exercise and Voltaren Gel. Alternate with Mobic. 12/12/2016 3. Right thalamic/internal capsule lacunar infarct with persistent left hemisensory deficits: Continue to Monitor. 12/12/2016 4. Bilateral Greater Trochanteric Bursitis: Continue current medication regime.   20 minutes of face to face patient care time was spent during this visit. All questions were encouraged and answered.  F/U in 1 month

## 2017-02-09 DIAGNOSIS — H50132 Monocular exotropia with V pattern, left eye: Secondary | ICD-10-CM | POA: Diagnosis not present

## 2017-02-11 ENCOUNTER — Encounter: Payer: Medicare Other | Attending: Physical Medicine & Rehabilitation | Admitting: Registered Nurse

## 2017-02-11 ENCOUNTER — Encounter: Payer: Self-pay | Admitting: Registered Nurse

## 2017-02-11 VITALS — BP 115/79 | HR 83

## 2017-02-11 DIAGNOSIS — M545 Low back pain: Secondary | ICD-10-CM | POA: Diagnosis not present

## 2017-02-11 DIAGNOSIS — M7061 Trochanteric bursitis, right hip: Secondary | ICD-10-CM | POA: Diagnosis not present

## 2017-02-11 DIAGNOSIS — Z79899 Other long term (current) drug therapy: Secondary | ICD-10-CM | POA: Diagnosis not present

## 2017-02-11 DIAGNOSIS — M25561 Pain in right knee: Secondary | ICD-10-CM | POA: Diagnosis not present

## 2017-02-11 DIAGNOSIS — H53462 Homonymous bilateral field defects, left side: Secondary | ICD-10-CM | POA: Insufficient documentation

## 2017-02-11 DIAGNOSIS — M47816 Spondylosis without myelopathy or radiculopathy, lumbar region: Secondary | ICD-10-CM

## 2017-02-11 DIAGNOSIS — M4696 Unspecified inflammatory spondylopathy, lumbar region: Secondary | ICD-10-CM

## 2017-02-11 DIAGNOSIS — G894 Chronic pain syndrome: Secondary | ICD-10-CM | POA: Diagnosis not present

## 2017-02-11 DIAGNOSIS — M5136 Other intervertebral disc degeneration, lumbar region: Secondary | ICD-10-CM | POA: Insufficient documentation

## 2017-02-11 DIAGNOSIS — M129 Arthropathy, unspecified: Secondary | ICD-10-CM | POA: Insufficient documentation

## 2017-02-11 DIAGNOSIS — I69398 Other sequelae of cerebral infarction: Secondary | ICD-10-CM | POA: Diagnosis not present

## 2017-02-11 DIAGNOSIS — M479 Spondylosis, unspecified: Secondary | ICD-10-CM | POA: Diagnosis not present

## 2017-02-11 DIAGNOSIS — M7062 Trochanteric bursitis, left hip: Secondary | ICD-10-CM | POA: Diagnosis not present

## 2017-02-11 DIAGNOSIS — Z5181 Encounter for therapeutic drug level monitoring: Secondary | ICD-10-CM

## 2017-02-11 MED ORDER — HYDROCODONE-ACETAMINOPHEN 7.5-325 MG PO TABS: 1.0000 | ORAL_TABLET | Freq: Three times a day (TID) | ORAL | 0 refills | Status: DC | PRN

## 2017-02-11 NOTE — Progress Notes (Signed)
Subjective:    Patient ID: Brittany Crawford, female    DOB: 1955-11-20, 61 y.o.   MRN: [id number]  HPI: [name] is a 61 year old female who returns for follow up appointment for chronic pain and medication refill. She states her pain is located in her lower back and bilateral hips. She rates her pain 4.Her current exercise regime is ,walking and performing stretching exercises.  Last UDS was performed 08/27/16, it was consistent. UDS ordered today.   Pain Inventory Average Pain 4 Pain Right Now 4 My pain is na  In the last 24 hours, has pain interfered with the following? General activity 4 Relation with others 10 Enjoyment of life 10 What TIME of day is your pain at its worst? night Sleep (in general) NA  Pain is worse with: bending, standing and some activites Pain improves with: rest, heat/ice and medication Relief from Meds: na  Mobility ability to climb steps?  yes do you drive?  yes  Function disabled: date disabled .  Neuro/Psych No problems in this area  Prior Studies Any changes since last visit?  no  Physicians involved in your care Any changes since last visit?  no   Family History  Problem Relation Age of Onset  . Colon cancer Maternal Uncle   . Stroke Mother    Social History   Social History  . Marital status: Married    Spouse name: N/A  . Number of children: 2  . Years of education: 82   Social History Main Topics  . Smoking status: Current Every Day Smoker    Packs/day: 0.25    Years: 0.00    Types: Cigarettes    Last attempt to quit: 10/15/2015  . Smokeless tobacco: Never Used     Comment: 4  to 5  cigarettes daily for years  . Alcohol use 0.6 oz/week    1 Glasses of wine per week     Comment: occasionaly   . Drug use: No  . Sexual activity: Yes   Other Topics Concern  . None   Social History Narrative   Patient is married with 2 children.   Patient is right handed.   Patient has hs education.   Patient  drinks 4 cups daily.   Past Surgical History:  Procedure Laterality Date  . DILATATION & CURRETTAGE/HYSTEROSCOPY WITH RESECTOCOPE N/A 12/10/2012   Procedure: DILATATION & CURETTAGE/HYSTEROSCOPY WITH RESECTOCOPE;  Surgeon: Marvene Staff, MD;  Location: [facility];  Service: Gynecology;  Laterality: N/A;  . FOOT SURGERY     left-pins placed  . LYMPH NODE BIOPSY    . POLYPECTOMY N/A 12/10/2012   Procedure: POLYPECTOMY;  Surgeon: Marvene Staff, MD;  Location: [facility];  Service: Gynecology;  Laterality: N/A;   Past Medical History:  Diagnosis Date  . Chronic back pain   . H/O sarcoidosis   . Hypertension   . Seasonal allergies   . Stroke ([place])    BP 115/79   Pulse 83   LMP 11/13/2012   SpO2 97%   Opioid Risk Score:   Fall Risk Score:  `1  Depression screen PHQ 2/9  Depression screen [facility] 2/9 02/11/2017 12/12/2016 04/28/2016 11/22/2015 06/26/2015 05/15/2015 01/02/2015  Decreased Interest 0 0 0 0 0 0 0  Down, Depressed, Hopeless 0 0 0 0 0 0 0  PHQ - 2 Score 0 0 0 0 0 0 0  Altered sleeping - - - - - - 0  Tired, decreased energy - - - - - -  0  Change in appetite - - - - - - 0  Feeling bad or failure about yourself  - - - - - - 0  Trouble concentrating - - - - - - 0  Moving slowly or fidgety/restless - - - - - - 1  Suicidal thoughts - - - - - - 0  PHQ-9 Score - - - - - - 1    Review of Systems  Constitutional: Negative.   HENT: Negative.   Eyes: Negative.   Respiratory: Negative.   Cardiovascular: Negative.   Gastrointestinal: Negative.   Endocrine: Negative.   Genitourinary: Negative.   Musculoskeletal: Negative.   Skin: Negative.   Allergic/Immunologic: Negative.   Neurological: Negative.   Hematological: Negative.   Psychiatric/Behavioral: Negative.   All other systems reviewed and are negative.      Objective:   Physical Exam  Constitutional: She is oriented to person, place, and time. She appears well-developed and well-nourished.  HENT:  Head: Normocephalic  and atraumatic.  Neck: Normal range of motion. Neck supple.  Cardiovascular: Normal rate and regular rhythm.   Pulmonary/Chest: Effort normal and breath sounds normal.  Musculoskeletal:  Normal Muscle Bulk and Muscle Testing Reveals: Upper Extremities: Full ROM and Muscle Strength 5/5 Lumbar Paraspinal Tenderness: L-3-L-5 Lower Extremities: Full ROM and Muscle Strength 5/5 Arises from Table with ease Narrow Based Gait  Neurological: She is alert and oriented to person, place, and time.  Skin: Skin is warm and dry.  Psychiatric: She has a normal mood and affect.  Nursing note and vitals reviewed.         Assessment & Plan:  1. Lumbar spondylosis with DDD and facet arthropathy. 02/11/2017 Refilled:Hydrocodone 7.5 /325 mg one tablet every 8 hours as needed for pain #75. Second script given for the following month. We will continue the opioid monitoring program, this consists of regular clinic visits, examinations, urine drug screen, pill counts as well as use of [place] Controlled Substance Reporting System. 2. Bilateral knee pain: No complaints voiced today:Continue with heat/ice, exercise and Voltaren Gel. Alternate with Mobic. 02/11/2017 3. Right thalamic/internal capsule lacunar infarct with persistent left hemisensory deficits: Continue to Monitor. 02/11/2017 4. Bilateral Greater Trochanteric Bursitis: Continue current medication regime. 02/11/2017   20 minutes of face to face patient care time was spent during this visit. All questions were encouraged and answered.  F/U in 1 month

## 2017-02-16 LAB — TOXASSURE SELECT,+ANTIDEPR,UR

## 2017-02-16 LAB — 6-ACETYLMORPHINE,TOXASSURE ADD
6-ACETYLMORPHINE: NEGATIVE
6-acetylmorphine: NOT DETECTED ng/mg creat

## 2017-02-17 ENCOUNTER — Telehealth: Payer: Self-pay | Admitting: *Deleted

## 2017-02-17 NOTE — Telephone Encounter (Signed)
Urine drug screen for this encounter is consistent for prescribed medication.  There is a low level morphine that is most likely nutritional source at that level.

## 2017-02-19 ENCOUNTER — Other Ambulatory Visit: Payer: Self-pay | Admitting: Family

## 2017-02-19 DIAGNOSIS — Z1231 Encounter for screening mammogram for malignant neoplasm of breast: Secondary | ICD-10-CM

## 2017-03-24 ENCOUNTER — Ambulatory Visit
Admission: RE | Admit: 2017-03-24 | Discharge: 2017-03-24 | Disposition: A | Discharge status: Home / Self Care | Payer: Medicare Other | Source: Ambulatory Visit | Attending: Family | Admitting: Family

## 2017-03-24 DIAGNOSIS — Z1231 Encounter for screening mammogram for malignant neoplasm of breast: Secondary | ICD-10-CM | POA: Diagnosis not present

## 2017-03-26 DIAGNOSIS — Z8673 Personal history of transient ischemic attack (TIA), and cerebral infarction without residual deficits: Secondary | ICD-10-CM | POA: Diagnosis not present

## 2017-03-26 DIAGNOSIS — E559 Vitamin D deficiency, unspecified: Secondary | ICD-10-CM | POA: Diagnosis not present

## 2017-03-26 DIAGNOSIS — E782 Mixed hyperlipidemia: Secondary | ICD-10-CM | POA: Diagnosis not present

## 2017-03-26 DIAGNOSIS — Z79899 Other long term (current) drug therapy: Secondary | ICD-10-CM | POA: Diagnosis not present

## 2017-03-26 DIAGNOSIS — I10 Essential (primary) hypertension: Secondary | ICD-10-CM | POA: Diagnosis not present

## 2017-04-08 ENCOUNTER — Telehealth: Payer: Self-pay | Admitting: Registered Nurse

## 2017-04-08 ENCOUNTER — Encounter: Payer: Medicare Other | Attending: Physical Medicine & Rehabilitation | Admitting: Registered Nurse

## 2017-04-08 ENCOUNTER — Encounter: Payer: Self-pay | Admitting: Registered Nurse

## 2017-04-08 VITALS — BP 123/83 | HR 90

## 2017-04-08 DIAGNOSIS — H53462 Homonymous bilateral field defects, left side: Secondary | ICD-10-CM | POA: Diagnosis not present

## 2017-04-08 DIAGNOSIS — Z79899 Other long term (current) drug therapy: Secondary | ICD-10-CM

## 2017-04-08 DIAGNOSIS — Z5181 Encounter for therapeutic drug level monitoring: Secondary | ICD-10-CM | POA: Diagnosis not present

## 2017-04-08 DIAGNOSIS — M25561 Pain in right knee: Secondary | ICD-10-CM | POA: Diagnosis not present

## 2017-04-08 DIAGNOSIS — M4696 Unspecified inflammatory spondylopathy, lumbar region: Secondary | ICD-10-CM

## 2017-04-08 DIAGNOSIS — G894 Chronic pain syndrome: Secondary | ICD-10-CM

## 2017-04-08 DIAGNOSIS — M47816 Spondylosis without myelopathy or radiculopathy, lumbar region: Secondary | ICD-10-CM

## 2017-04-08 DIAGNOSIS — M5136 Other intervertebral disc degeneration, lumbar region: Secondary | ICD-10-CM | POA: Diagnosis not present

## 2017-04-08 DIAGNOSIS — M479 Spondylosis, unspecified: Secondary | ICD-10-CM | POA: Insufficient documentation

## 2017-04-08 DIAGNOSIS — M545 Low back pain: Secondary | ICD-10-CM | POA: Insufficient documentation

## 2017-04-08 DIAGNOSIS — M129 Arthropathy, unspecified: Secondary | ICD-10-CM | POA: Insufficient documentation

## 2017-04-08 DIAGNOSIS — I69398 Other sequelae of cerebral infarction: Secondary | ICD-10-CM | POA: Insufficient documentation

## 2017-04-08 MED ORDER — HYDROCODONE-ACETAMINOPHEN 7.5-325 MG PO TABS: 1.0000 | ORAL_TABLET | Freq: Three times a day (TID) | ORAL | 0 refills | Status: DC | PRN

## 2017-04-08 NOTE — Progress Notes (Signed)
Subjective:    Patient ID: Brittany Crawford, female    DOB: 03/23/1956, 61 y.o.   MRN: [id number]  HPI: Brittany Crawford is a 61year old female who returns for follow up appointmentfor chronic pain and medication refill. She states her pain is located in her lower back. She rates her pain 4.Her current exercise regime is walking QOD and performing stretching exercises.   Also states she hasn't smoked in 28 days, encouraged to continue with smoking cessation.   Last UDS was performed 02/11/2017, it was reviewed.    Pain Inventory Average Pain 5 Pain Right Now 4 My pain is intermittent, dull and aching  In the last 24 hours, has pain interfered with the following? General activity 7 Relation with others 8 Enjoyment of life 8 What TIME of day is your pain at its worst? evening Sleep (in general) Fair  Pain is worse with: bending and standing Pain improves with: rest, heat/ice, therapy/exercise and medication Relief from Meds: 10  Mobility walk without assistance  Function disabled: date disabled .  Neuro/Psych No problems in this area  Prior Studies Any changes since last visit?  no  Physicians involved in your care Any changes since last visit?  no   Family History  Problem Relation Age of Onset  . Colon cancer Maternal Uncle   . Stroke Mother    Social History   Social History  . Marital status: Married    Spouse name: N/A  . Number of children: 2  . Years of education: 71   Social History Main Topics  . Smoking status: Current Every Day Smoker    Packs/day: 0.25    Years: 0.00    Types: Cigarettes    Last attempt to quit: 10/15/2015  . Smokeless tobacco: Never Used     Comment: 4  to 5  cigarettes daily for years  . Alcohol use 0.6 oz/week    1 Glasses of wine per week     Comment: occasionaly   . Drug use: No  . Sexual activity: Yes   Other Topics Concern  . None   Social History Narrative   Patient is married with 2 children.   Patient is right handed.   Patient has hs education.   Patient drinks 4 cups daily.   Past Surgical History:  Procedure Laterality Date  . BREAST BIOPSY Left 04/29/2016  . BREAST BIOPSY Left 04/23/2016  . DILATATION & CURRETTAGE/HYSTEROSCOPY WITH RESECTOCOPE N/A 12/10/2012   Procedure: DILATATION & CURETTAGE/HYSTEROSCOPY WITH RESECTOCOPE;  Surgeon: Marvene Staff, MD;  Location: [facility];  Service: Gynecology;  Laterality: N/A;  . FOOT SURGERY     left-pins placed  . LYMPH NODE BIOPSY    . POLYPECTOMY N/A 12/10/2012   Procedure: POLYPECTOMY;  Surgeon: Marvene Staff, MD;  Location: [facility];  Service: Gynecology;  Laterality: N/A;   Past Medical History:  Diagnosis Date  . Chronic back pain   . H/O sarcoidosis   . Hypertension   . Seasonal allergies   . Stroke ([place])    LMP 11/13/2012   Opioid Risk Score:   Fall Risk Score:  `1  Depression screen PHQ 2/9  Depression screen [facility] 2/9 02/11/2017 12/12/2016 04/28/2016 11/22/2015 06/26/2015 05/15/2015 01/02/2015  Decreased Interest 0 0 0 0 0 0 0  Down, Depressed, Hopeless 0 0 0 0 0 0 0  PHQ - 2 Score 0 0 0 0 0 0 0  Altered sleeping - - - - - - 0  Tired, decreased energy - - - - - - 0  Change in appetite - - - - - - 0  Feeling bad or failure about yourself  - - - - - - 0  Trouble concentrating - - - - - - 0  Moving slowly or fidgety/restless - - - - - - 1  Suicidal thoughts - - - - - - 0  PHQ-9 Score - - - - - - 1     Review of Systems  Constitutional: Negative.   HENT: Negative.   Eyes: Negative.   Respiratory: Negative.   Cardiovascular: Negative.   Gastrointestinal: Negative.   Endocrine: Negative.   Genitourinary: Negative.   Musculoskeletal: Negative.   Skin: Negative.   Allergic/Immunologic: Negative.   Neurological: Negative.   Hematological: Negative.   Psychiatric/Behavioral: Negative.   All other systems reviewed and are negative.      Objective:   Physical Exam  Constitutional: She is oriented to  person, place, and time. She appears well-developed and well-nourished.  HENT:  Head: Normocephalic and atraumatic.  Neck: Normal range of motion. Neck supple.  Cardiovascular: Normal rate and regular rhythm.   Pulmonary/Chest: Effort normal and breath sounds normal.  Musculoskeletal:  Normal Muscle Bulk and Muscle Testing Reveals: Upper Extremities: Full ROM and Muscle Strength 5/5 Lumbar Paraspinal Tenderness: L-3-L-5 Lower Extremities: Full ROM and Muscle Strength 5/5 Arises from the Table with ease Narrow Based Gait  Neurological: She is alert and oriented to person, place, and time.  Skin: Skin is warm and dry.  Psychiatric: She has a normal mood and affect.  Nursing note and vitals reviewed.         Assessment & Plan:  1. Lumbar spondylosis with DDD and facet arthropathy. 04/08/2017 Refilled:Hydrocodone 7.5 /325 mg one tablet every 8 hours as needed for pain #75. We will continue the opioid monitoring program, this consists of regular clinic visits, examinations, urine drug screen, pill counts as well as use of [place] Controlled Substance Reporting System. 2. Bilateral knee pain: No complaints voiced today:Continue with heat/ice, exercise and Voltaren Gel. Alternate with Mobic. 04/08/2017 3. Right thalamic/internal capsule lacunar infarct with persistent left hemisensory deficits: Continue to Monitor. 04/08/2017 4. Bilateral Greater Trochanteric Bursitis: No complaints today.Continue current medication regime. 04/08/2017  20 minutes of face to face patient care time was spent during this visit. All questions were encouraged and answered.   F/U in 1 month

## 2017-04-08 NOTE — Telephone Encounter (Signed)
On 04/08/2017 the [place] was reviewed no conflict was seen on the [place] with multiple prescribers. Ms. Kushnir has a signed narcotic contract with our office. If there were any discrepancies this would have been reported to her physician.

## 2017-04-10 ENCOUNTER — Ambulatory Visit: Payer: Self-pay | Admitting: Ophthalmology

## 2017-04-10 ENCOUNTER — Encounter (HOSPITAL_BASED_OUTPATIENT_CLINIC_OR_DEPARTMENT_OTHER): Payer: Self-pay | Admitting: *Deleted

## 2017-04-12 ENCOUNTER — Ambulatory Visit: Payer: Self-pay | Admitting: Ophthalmology

## 2017-04-12 NOTE — H&P (Signed)
Date of examination:  02-09-17  Indication for surgery: to straighten the eyes and allow some binocularity  Pertinent past medical history:  Past Medical History:  Diagnosis Date  . Chronic back pain   . H/Crawford sarcoidosis   . Hypertension   . Seasonal allergies   . Stroke [facility])     Pertinent ocular history:  Left eye drifts outward for years.  Sarcoid, very poor VA OS ? APD.  Hx TIA  Pertinent family history:  Family History  Problem Relation Age of Onset  . Colon cancer Maternal Uncle   . Stroke Mother     General:  Healthy appearing patient in no distress.    Eyes:    Acuity  cc OD 20/20  OS CF@ 1-2 ft  External: Within normal limits     Pupils:  ? Small L APD  Anterior segment: Mod NS OU  Motility:   LXT 45 with small L hypo, + "V", 2+ RIO OA  Fundus: deferred  Refraction:   OD myopia/astig   Heart: Regular rate and rhythm without murmur     Lungs: Clear to auscultation      Impression:Exotropia, sensory   Sarcoidosis with hx chronic uveitis OS  Plan: Left lateral rectus muscle recession and left medial rectus muscle resection  Brittany Crawford

## 2017-04-16 ENCOUNTER — Encounter (HOSPITAL_BASED_OUTPATIENT_CLINIC_OR_DEPARTMENT_OTHER)
Admission: RE | Admit: 2017-04-16 | Discharge: 2017-04-16 | Disposition: A | Discharge status: Home / Self Care | Payer: Medicare Other | Source: Ambulatory Visit | Attending: Ophthalmology | Admitting: Ophthalmology

## 2017-04-16 ENCOUNTER — Ambulatory Visit: Payer: Self-pay | Admitting: Ophthalmology

## 2017-04-16 ENCOUNTER — Other Ambulatory Visit: Payer: Self-pay

## 2017-04-16 DIAGNOSIS — H5010 Unspecified exotropia: Secondary | ICD-10-CM | POA: Diagnosis not present

## 2017-04-16 DIAGNOSIS — M1990 Unspecified osteoarthritis, unspecified site: Secondary | ICD-10-CM | POA: Diagnosis not present

## 2017-04-16 DIAGNOSIS — Z8673 Personal history of transient ischemic attack (TIA), and cerebral infarction without residual deficits: Secondary | ICD-10-CM | POA: Diagnosis not present

## 2017-04-16 DIAGNOSIS — I10 Essential (primary) hypertension: Secondary | ICD-10-CM | POA: Diagnosis not present

## 2017-04-16 DIAGNOSIS — Z79899 Other long term (current) drug therapy: Secondary | ICD-10-CM | POA: Diagnosis not present

## 2017-04-16 DIAGNOSIS — D869 Sarcoidosis, unspecified: Secondary | ICD-10-CM | POA: Diagnosis not present

## 2017-04-16 DIAGNOSIS — Z87891 Personal history of nicotine dependence: Secondary | ICD-10-CM | POA: Diagnosis not present

## 2017-04-16 DIAGNOSIS — G8929 Other chronic pain: Secondary | ICD-10-CM | POA: Diagnosis not present

## 2017-04-16 LAB — BASIC METABOLIC PANEL
ANION GAP: 10 (ref 5–15)
BUN: 7 mg/dL (ref 6–20)
CHLORIDE: 107 mmol/L (ref 101–111)
CO2: 23 mmol/L (ref 22–32)
CREATININE: 1.1 mg/dL — AB (ref 0.44–1.00)
Calcium: 9.7 mg/dL (ref 8.9–10.3)
GFR calc non Af Amer: 54 mL/min — ABNORMAL LOW (ref >60)
Glucose, Bld: 108 mg/dL — ABNORMAL HIGH (ref 65–99)
Potassium: 3.5 mmol/L (ref 3.5–5.1)
Sodium: 140 mmol/L (ref 135–145)

## 2017-04-16 NOTE — H&P (Signed)
Date of examination:  02-09-17  Indication for surgery: to straighten the eyes and allow some binocularity  Pertinent past medical history:  Past Medical History:  Diagnosis Date  . Chronic back pain   . H/O sarcoidosis   . Hypertension   . Seasonal allergies   . Stroke [facility])     Pertinent ocular history:  Low vision left eye, hx sarcoidosis.  Left eye drifts out for several years  Pertinent family history:  Family History  Problem Relation Age of Onset  . Colon cancer Maternal Uncle   . Stroke Mother     General:  Healthy appearing patient in no distress.    Eyes:    Acuity  cc OD 20/20  OS CF@1 -2  Pupils ? Small L APD  External: Within normal limits     Anterior segment: Within normal limits   X NS OS  Motility:   LXT=45 (K) + "V", 2+ RIO OA  Fundus: deferred  Refraction:   OD -3 SE approx   Heart: Regular rate and rhythm without murmur     Lungs: Clear to auscultation     Abdomen: Soft, nontender, normal bowel sounds     Impression:  Sensory exotropia left eye  Plan: Left lateral rectus muscle recession, left medial rectus muscle resection, no obliques  Brittany Crawford O

## 2017-04-17 ENCOUNTER — Ambulatory Visit (HOSPITAL_BASED_OUTPATIENT_CLINIC_OR_DEPARTMENT_OTHER): Payer: Medicare Other | Admitting: Anesthesiology

## 2017-04-17 ENCOUNTER — Ambulatory Visit (HOSPITAL_BASED_OUTPATIENT_CLINIC_OR_DEPARTMENT_OTHER)
Admission: RE | Admit: 2017-04-17 | Discharge: 2017-04-17 | Disposition: A | Discharge status: Home / Self Care | Payer: Medicare Other | Source: Ambulatory Visit | Attending: Ophthalmology | Admitting: Ophthalmology

## 2017-04-17 ENCOUNTER — Encounter (HOSPITAL_BASED_OUTPATIENT_CLINIC_OR_DEPARTMENT_OTHER): Payer: Self-pay | Admitting: Anesthesiology

## 2017-04-17 ENCOUNTER — Encounter (HOSPITAL_BASED_OUTPATIENT_CLINIC_OR_DEPARTMENT_OTHER)
Admission: RE | Disposition: A | Discharge status: Home / Self Care | Payer: Self-pay | Source: Ambulatory Visit | Attending: Ophthalmology

## 2017-04-17 DIAGNOSIS — I10 Essential (primary) hypertension: Secondary | ICD-10-CM | POA: Diagnosis not present

## 2017-04-17 DIAGNOSIS — Z79899 Other long term (current) drug therapy: Secondary | ICD-10-CM | POA: Insufficient documentation

## 2017-04-17 DIAGNOSIS — Z87891 Personal history of nicotine dependence: Secondary | ICD-10-CM | POA: Diagnosis not present

## 2017-04-17 DIAGNOSIS — H5010 Unspecified exotropia: Secondary | ICD-10-CM | POA: Diagnosis not present

## 2017-04-17 DIAGNOSIS — Z8673 Personal history of transient ischemic attack (TIA), and cerebral infarction without residual deficits: Secondary | ICD-10-CM | POA: Insufficient documentation

## 2017-04-17 DIAGNOSIS — I639 Cerebral infarction, unspecified: Secondary | ICD-10-CM | POA: Diagnosis not present

## 2017-04-17 DIAGNOSIS — M1990 Unspecified osteoarthritis, unspecified site: Secondary | ICD-10-CM | POA: Insufficient documentation

## 2017-04-17 DIAGNOSIS — H50132 Monocular exotropia with V pattern, left eye: Secondary | ICD-10-CM | POA: Diagnosis not present

## 2017-04-17 DIAGNOSIS — G8929 Other chronic pain: Secondary | ICD-10-CM | POA: Insufficient documentation

## 2017-04-17 DIAGNOSIS — H5000 Unspecified esotropia: Secondary | ICD-10-CM | POA: Diagnosis not present

## 2017-04-17 DIAGNOSIS — M4696 Unspecified inflammatory spondylopathy, lumbar region: Secondary | ICD-10-CM | POA: Diagnosis not present

## 2017-04-17 DIAGNOSIS — D869 Sarcoidosis, unspecified: Secondary | ICD-10-CM | POA: Insufficient documentation

## 2017-04-17 HISTORY — PX: STRABISMUS SURGERY: SHX218

## 2017-04-17 SURGERY — REPAIR STRABISMUS
Anesthesia: General | Site: Eye | Laterality: Left

## 2017-04-17 MED ORDER — GLYCOPYRROLATE 0.2 MG/ML IJ SOLN
INTRAMUSCULAR | Status: DC | PRN
  Administered 2017-04-17: 0.2 mg via INTRAVENOUS

## 2017-04-17 MED ORDER — FENTANYL CITRATE (PF) 100 MCG/2ML IJ SOLN
INTRAMUSCULAR | Status: DC | PRN
  Administered 2017-04-17: 25 ug via INTRAVENOUS
  Administered 2017-04-17: 100 ug via INTRAVENOUS

## 2017-04-17 MED ORDER — LACTATED RINGERS IV SOLN
INTRAVENOUS | Status: DC | PRN
  Administered 2017-04-17: 09:00:00 via INTRAVENOUS

## 2017-04-17 MED ORDER — MIDAZOLAM HCL 2 MG/2ML IJ SOLN
INTRAMUSCULAR | Status: AC
  Filled 2017-04-17: qty 2

## 2017-04-17 MED ORDER — FENTANYL CITRATE (PF) 100 MCG/2ML IJ SOLN
INTRAMUSCULAR | Status: AC
  Filled 2017-04-17: qty 2

## 2017-04-17 MED ORDER — DEXAMETHASONE SODIUM PHOSPHATE 10 MG/ML IJ SOLN
INTRAMUSCULAR | Status: AC
  Filled 2017-04-17: qty 1

## 2017-04-17 MED ORDER — PROPOFOL 10 MG/ML IV BOLUS
INTRAVENOUS | Status: DC | PRN
  Administered 2017-04-17: 100 mg via INTRAVENOUS
  Administered 2017-04-17: 200 mg via INTRAVENOUS

## 2017-04-17 MED ORDER — LIDOCAINE 2% (20 MG/ML) 5 ML SYRINGE
INTRAMUSCULAR | Status: DC | PRN
  Administered 2017-04-17: 60 mg via INTRAVENOUS

## 2017-04-17 MED ORDER — ONDANSETRON HCL 4 MG/2ML IJ SOLN
INTRAMUSCULAR | Status: AC
  Filled 2017-04-17: qty 2

## 2017-04-17 MED ORDER — HYDROMORPHONE HCL 1 MG/ML IJ SOLN
INTRAMUSCULAR | Status: AC
  Filled 2017-04-17: qty 0.5

## 2017-04-17 MED ORDER — MIDAZOLAM HCL 2 MG/2ML IJ SOLN: 1.0000 mg | INTRAMUSCULAR | Status: DC | PRN

## 2017-04-17 MED ORDER — OXYCODONE HCL 5 MG PO TABS: 5.0000 mg | ORAL_TABLET | Freq: Once | ORAL | Status: DC | PRN

## 2017-04-17 MED ORDER — TOBRAMYCIN-DEXAMETHASONE 0.3-0.1 % OP OINT
TOPICAL_OINTMENT | OPHTHALMIC | Status: DC | PRN
  Administered 2017-04-17: 1 via OPHTHALMIC

## 2017-04-17 MED ORDER — ONDANSETRON HCL 4 MG/2ML IJ SOLN
INTRAMUSCULAR | Status: DC | PRN
  Administered 2017-04-17: 4 mg via INTRAVENOUS

## 2017-04-17 MED ORDER — HYDROMORPHONE HCL-NACL 0.5-0.9 MG/ML-% IV SOSY
0.2500 mg | PREFILLED_SYRINGE | INTRAVENOUS | Status: DC | PRN
  Administered 2017-04-17: 0.5 mg via INTRAVENOUS

## 2017-04-17 MED ORDER — LIDOCAINE HCL (CARDIAC) 20 MG/ML IV SOLN
INTRAVENOUS | Status: AC
  Filled 2017-04-17: qty 5

## 2017-04-17 MED ORDER — SCOPOLAMINE 1 MG/3DAYS TD PT72: 1.0000 | MEDICATED_PATCH | Freq: Once | TRANSDERMAL | Status: DC | PRN

## 2017-04-17 MED ORDER — OXYCODONE HCL 5 MG/5ML PO SOLN: 5.0000 mg | Freq: Once | ORAL | Status: DC | PRN

## 2017-04-17 MED ORDER — DEXAMETHASONE SODIUM PHOSPHATE 4 MG/ML IJ SOLN
INTRAMUSCULAR | Status: DC | PRN
  Administered 2017-04-17: 10 mg via INTRAVENOUS

## 2017-04-17 MED ORDER — FENTANYL CITRATE (PF) 100 MCG/2ML IJ SOLN: 50.0000 ug | INTRAMUSCULAR | Status: DC | PRN

## 2017-04-17 MED ORDER — LACTATED RINGERS IV SOLN
INTRAVENOUS | Status: DC
  Administered 2017-04-17: 10:00:00 via INTRAVENOUS

## 2017-04-17 MED ORDER — PROMETHAZINE HCL 25 MG/ML IJ SOLN: 6.2500 mg | INTRAMUSCULAR | Status: DC | PRN

## 2017-04-17 MED ORDER — KETOROLAC TROMETHAMINE 30 MG/ML IJ SOLN
INTRAMUSCULAR | Status: AC
  Filled 2017-04-17: qty 1

## 2017-04-17 MED ORDER — MIDAZOLAM HCL 5 MG/5ML IJ SOLN
INTRAMUSCULAR | Status: DC | PRN
  Administered 2017-04-17: 2 mg via INTRAVENOUS

## 2017-04-17 MED ORDER — TOBRAMYCIN-DEXAMETHASONE 0.3-0.1 % OP OINT: 1.0000 "application " | TOPICAL_OINTMENT | Freq: Two times a day (BID) | OPHTHALMIC | 0 refills | Status: DC

## 2017-04-17 MED ORDER — BSS IO SOLN
INTRAOCULAR | Status: DC | PRN
  Administered 2017-04-17: 15 mL via INTRAOCULAR

## 2017-04-17 MED ORDER — MEPERIDINE HCL 25 MG/ML IJ SOLN: 6.2500 mg | INTRAMUSCULAR | Status: DC | PRN

## 2017-04-17 SURGICAL SUPPLY — 30 items
APL SRG 3 HI ABS STRL LF PLS (MISCELLANEOUS) ×1
APPLICATOR COTTON TIP 6IN STRL (MISCELLANEOUS) ×8 IMPLANT
APPLICATOR DR MATTHEWS STRL (MISCELLANEOUS) ×2 IMPLANT
BANDAGE EYE OVAL (MISCELLANEOUS) IMPLANT
CAUTERY EYE LOW TEMP 1300F FIN (OPHTHALMIC RELATED) IMPLANT
COVER BACK TABLE 60X90IN (DRAPES) ×2 IMPLANT
COVER MAYO STAND STRL (DRAPES) ×2 IMPLANT
DRAPE SURG 17X23 STRL (DRAPES) ×4 IMPLANT
DRAPE U-SHAPE 76X120 STRL (DRAPES) IMPLANT
GLOVE BIO SURGEON STRL SZ 6.5 (GLOVE) ×5 IMPLANT
GLOVE BIOGEL M STRL SZ7.5 (GLOVE) ×2 IMPLANT
GOWN STRL REUS W/ TWL LRG LVL3 (GOWN DISPOSABLE) ×1 IMPLANT
GOWN STRL REUS W/TWL LRG LVL3 (GOWN DISPOSABLE) ×2
GOWN STRL REUS W/TWL XL LVL3 (GOWN DISPOSABLE) ×4 IMPLANT
NS IRRIG 1000ML POUR BTL (IV SOLUTION) ×2 IMPLANT
PACK BASIN DAY SURGERY FS (CUSTOM PROCEDURE TRAY) ×2 IMPLANT
SHEET MEDIUM DRAPE 40X70 STRL (DRAPES) IMPLANT
SPEAR EYE SURG WECK-CEL (MISCELLANEOUS) ×4 IMPLANT
STRIP CLOSURE SKIN 1/4X4 (GAUZE/BANDAGES/DRESSINGS) IMPLANT
SUT 6 0 SILK T G140 8DA (SUTURE) IMPLANT
SUT MERSILENE 6-0 18IN S14 8MM (SUTURE)
SUT PLAIN 6 0 TG1408 (SUTURE) ×1 IMPLANT
SUT SILK 4 0 C 3 735G (SUTURE) IMPLANT
SUT VICRYL 6 0 S 28 (SUTURE) ×1 IMPLANT
SUT VICRYL ABS 6-0 S29 18IN (SUTURE) ×1 IMPLANT
SUTURE MERSLN 6-0 18IN S14 8MM (SUTURE) IMPLANT
SYR 10ML LL (SYRINGE) ×2 IMPLANT
SYR TB 1ML LL NO SAFETY (SYRINGE) ×2 IMPLANT
TOWEL OR 17X24 6PK STRL BLUE (TOWEL DISPOSABLE) ×2 IMPLANT
TRAY DSU PREP LF (CUSTOM PROCEDURE TRAY) ×2 IMPLANT

## 2017-04-17 NOTE — Anesthesia Postprocedure Evaluation (Signed)
Anesthesia Post Note  Patient: Brittany Crawford  Procedure(s) Performed: Procedure(s) (LRB): REPAIR STRABISMUS LEFT EYE (Left)     Patient location during evaluation: PACU Anesthesia Type: General Level of consciousness: sedated and patient cooperative Pain management: pain level controlled Vital Signs Assessment: post-procedure vital signs reviewed and stable Respiratory status: spontaneous breathing Cardiovascular status: stable Anesthetic complications: no    Last Vitals:  Vitals:   04/17/17 1245 04/17/17 1320  BP: 130/78 128/78  Pulse: 69 69  Resp: 16   Temp:  36.7 C    Last Pain:  Vitals:   04/17/17 1320  TempSrc:   PainSc: 2                  Nolon Nations

## 2017-04-17 NOTE — Interval H&P Note (Signed)
History and Physical Interval Note:  04/17/2017 10:06 AM  Brittany Crawford  has presented today for surgery, with the diagnosis of EXOTROPIA  The various methods of treatment have been discussed with the patient and family. After consideration of risks, benefits and other options for treatment, the patient has consented to  Procedure(s): REPAIR STRABISMUS LEFT EYE (Left) as a surgical intervention .  The patient's history has been reviewed, patient examined, no change in status, stable for surgery.  I have reviewed the patient's chart and labs.  Questions were answered to the patient's satisfaction.     Derry Skill

## 2017-04-17 NOTE — Discharge Instructions (Signed)
Diet: Clear liquids, advance to soft foods then regular diet as tolerated by this evening. ° °Pain control:  ° 1)  Ibuprofen 600 mg by mouth every 6-8 hours as needed for pain ° 2)  Ice pack/cold compress to operated eye(s) as desired ° °Eye medications:  Tobradex or Zylet eye ointment 1/2 inch in operated eye(s) twice a day if directed to do so by Dr. Young ° °Activity: No swimming for 1 week.  It is OK to let water run over the face and eyes while showering or taking a bath, even during the first week.  No other restriction on exercise or activity. ° ° °Call Dr. Young's office [phone] with any problems or concerns. ° ° ° ° °Post Anesthesia Home Care Instructions ° °Activity: °Get plenty of rest for the remainder of the day. A responsible individual must stay with you for 24 hours following the procedure.  °For the next 24 hours, DO NOT: °-Drive a car °-Operate machinery °-Drink alcoholic beverages °-Take any medication unless instructed by your physician °-Make any legal decisions or sign important papers. ° °Meals: °Start with liquid foods such as gelatin or soup. Progress to regular foods as tolerated. Avoid greasy, spicy, heavy foods. If nausea and/or vomiting occur, drink only clear liquids until the nausea and/or vomiting subsides. Call your physician if vomiting continues. ° °Special Instructions/Symptoms: °Your throat may feel dry or sore from the anesthesia or the breathing tube placed in your throat during surgery. If this causes discomfort, gargle with warm salt water. The discomfort should disappear within 24 hours. ° °If you had a scopolamine patch placed behind your ear for the management of post- operative nausea and/or vomiting: ° °1. The medication in the patch is effective for 72 hours, after which it should be removed.  Wrap patch in a tissue and discard in the trash. Wash hands thoroughly with soap and water. °2. You may remove the patch earlier than 72 hours if you experience unpleasant  side effects which may include dry mouth, dizziness or visual disturbances. °3. Avoid touching the patch. Wash your hands with soap and water after contact with the patch. °   ° °

## 2017-04-17 NOTE — H&P (Signed)
Interval History and Physical Examination:  Brittany Crawford  04/17/2017  Date of Initial H&P: 02-09-17   The patient has been reexamined. The H&P has been reviewed. There is no change in the plan of care.  The patient has no new complaints. The indications for today's procedure remain valid. There are no medical contraindications for proceeding with today's planned surgery and we will proceed as planned.  Everitt Amber OMD

## 2017-04-17 NOTE — H&P (View-Only) (Signed)
Date of examination:  02-09-17  Indication for surgery: to straighten the eyes and allow some binocularity  Pertinent past medical history:  Past Medical History:  Diagnosis Date  . Chronic back pain   . H/O sarcoidosis   . Hypertension   . Seasonal allergies   . Stroke [facility])     Pertinent ocular history:  Low vision left eye, hx sarcoidosis.  Left eye drifts out for several years  Pertinent family history:  Family History  Problem Relation Age of Onset  . Colon cancer Maternal Uncle   . Stroke Mother     General:  Healthy appearing patient in no distress.    Eyes:    Acuity  cc OD 20/20  OS CF@1 -2  Pupils ? Small L APD  External: Within normal limits     Anterior segment: Within normal limits   X NS OS  Motility:   LXT=45 (K) + "V", 2+ RIO OA  Fundus: deferred  Refraction:   OD -3 SE approx   Heart: Regular rate and rhythm without murmur     Lungs: Clear to auscultation     Abdomen: Soft, nontender, normal bowel sounds     Impression:  Sensory exotropia left eye  Plan: Left lateral rectus muscle recession, left medial rectus muscle resection, no obliques  Boyce Keltner O

## 2017-04-17 NOTE — Transfer of Care (Signed)
Immediate Anesthesia Transfer of Care Note  Patient: Brittany Crawford  Procedure(s) Performed: Procedure(s): REPAIR STRABISMUS LEFT EYE (Left)  Patient Location: PACU  Anesthesia Type:General  Level of Consciousness: awake and sedated  Airway & Oxygen Therapy: Patient Spontanous Breathing and Patient connected to face mask oxygen  Post-op Assessment: Report given to RN and Post -op Vital signs reviewed and stable  Post vital signs: Reviewed and stable  Last Vitals:  Vitals:   04/17/17 1126 04/17/17 1128  BP:    Pulse:  80  Resp:  14  Temp: 36.8 C     Last Pain:  Vitals:   04/17/17 0910  TempSrc: Oral  PainSc: 0-No pain         Complications: No apparent anesthesia complications

## 2017-04-17 NOTE — Anesthesia Preprocedure Evaluation (Signed)
Anesthesia Evaluation  Patient identified by MRN, date of birth, ID band Patient awake    Reviewed: Allergy & Precautions, H&P , NPO status , Patient's Chart, lab work & pertinent test results  Airway Mallampati: I  TM Distance: >3 FB Neck ROM: full    Dental no notable dental hx. (+) Teeth Intact   Pulmonary former smoker,    Pulmonary exam normal        Cardiovascular hypertension, Pt. on medications Normal cardiovascular exam Rhythm:Regular Rate:Normal     Neuro/Psych CVA negative psych ROS   GI/Hepatic negative GI ROS, Neg liver ROS,   Endo/Other  negative endocrine ROS  Renal/GU negative Renal ROS     Musculoskeletal  (+) Arthritis ,   Abdominal Normal abdominal exam  (+)   Peds  Hematology negative hematology ROS (+)   Anesthesia Other Findings   Reproductive/Obstetrics negative OB ROS                            Anesthesia Physical  Anesthesia Plan  ASA: III  Anesthesia Plan: General   Post-op Pain Management:    Induction: Intravenous  PONV Risk Score and Plan: 2 and Ondansetron and Dexamethasone  Airway Management Planned: LMA  Additional Equipment:   Intra-op Plan:   Post-operative Plan: Extubation in OR  Informed Consent: I have reviewed the patients History and Physical, chart, labs and discussed the procedure including the risks, benefits and alternatives for the proposed anesthesia with the patient or authorized representative who has indicated his/her understanding and acceptance.   Dental advisory given  Plan Discussed with: CRNA  Anesthesia Plan Comments:        Anesthesia Quick Evaluation

## 2017-04-17 NOTE — Anesthesia Procedure Notes (Signed)
Procedure Name: LMA Insertion Performed by: Lavontay Kirk W Pre-anesthesia Checklist: Patient identified, Emergency Drugs available, Suction available and Patient being monitored Patient Re-evaluated:Patient Re-evaluated prior to inductionOxygen Delivery Method: Circle system utilized Preoxygenation: Pre-oxygenation with 100% oxygen Intubation Type: IV induction Ventilation: Mask ventilation without difficulty LMA: LMA flexible inserted LMA Size: 4.0 Number of attempts: 1 Placement Confirmation: positive ETCO2 Tube secured with: Tape Dental Injury: Teeth and Oropharynx as per pre-operative assessment        

## 2017-04-17 NOTE — Op Note (Signed)
04/17/2017  11:26 AM  PATIENT:  Brittany Crawford    PRE-OPERATIVE DIAGNOSIS:  Exotropia  POST-OPERATIVE DIAGNOSIS:  same  PROCEDURE:  1. Lateral rectus muscle recession 8.5 mm left eye   2.  Medial rectus muscle resection 7.0 mm left eye    SURGEON:  Derry Skill, MD  ANESTHESIA:   General  COMPLICATIONS: none  OPERATIVE PROCEDURE: After routine preoperative evaluation including informed consent, the patient was taken to the operating room where She was identified by me. General anesthesia was induced without difficulty after placement of appropriate monitors. The patient was prepped and draped in standard sterile fashion. A lid speculum was placed in the left eye.   Through an inferotemporal fornix incision, the left lateral rectus muscle was engaged on a series of muscle hooks and cleared of its fascial attachments. The tendon was secured with a double-armed 6-0 Vicryl suture, with a locking bite at each border of the muscle, 1 mm from the insertion. The muscle was disinserted.  It was reattached to sclera at a measured distance of 8.5 mm posterior to the original insertion, using direct scleral passes in crossed swords fashion. The suture ends were tied securely after the position of the muscle had been checked and found to be accurate. Conjunctiva was closed with a single 6-0 plain gut suture.  Through an inferonasal fornix incision through conjunctiva and Tenon's fascia, the left medial rectus muscle was engaged on a series of muscle hooks and carefully cleared of its fascial attachments to at least 15 mm posterior to the insertion. The muscle was spread between 2 self-retaining hooks. A 2 mm bite was taken of the center of the muscle belly at a  Measured distance of 7.0 mm posterior to the insertion, and a knot was tied securely at this location. The needle at each end of the double-armed suture was passed from the center of the muscle belly to the periphery, parallel to and 7.0 mm  posterior to the insertion. A resection clamp was placed on the muscle just anterior to the sutures. The muscle was disinserted. Each pole suture was passed posteriorly to anteriorly through the corresponding end of the muscle stump, then anteriorly to posteriorly near the center of the stump, then posteriorly to anteriorly through the center of the muscle belly, just posterior to the previously placed knot.  The muscle was drawn up to the level of the original insertion, and all slack was removed.  The suture ends were tied securely. The resection clamp was removed.  The portion of the muscle anterior to the sutures was carefully excised. Conjunctiva was closed with a single 6-0 plain gut suture. Tobradex ophthalmic ointment was placed in left eye(s). The patient was awakened without difficulty and taken to the recovery room in stable condition, having suffered no intraoperative or immediate postoperative complications. Marland Kitchen  Derry Skill, MD

## 2017-04-20 ENCOUNTER — Encounter (HOSPITAL_BASED_OUTPATIENT_CLINIC_OR_DEPARTMENT_OTHER): Payer: Self-pay | Admitting: Ophthalmology

## 2017-05-05 ENCOUNTER — Encounter: Payer: Medicare Other | Attending: Physical Medicine & Rehabilitation | Admitting: Registered Nurse

## 2017-05-05 ENCOUNTER — Encounter: Payer: Self-pay | Admitting: Registered Nurse

## 2017-05-05 VITALS — BP 118/75 | HR 79 | Temp 98.6°F

## 2017-05-05 DIAGNOSIS — M7062 Trochanteric bursitis, left hip: Secondary | ICD-10-CM

## 2017-05-05 DIAGNOSIS — Z79899 Other long term (current) drug therapy: Secondary | ICD-10-CM | POA: Diagnosis not present

## 2017-05-05 DIAGNOSIS — H53462 Homonymous bilateral field defects, left side: Secondary | ICD-10-CM | POA: Diagnosis not present

## 2017-05-05 DIAGNOSIS — M545 Low back pain: Secondary | ICD-10-CM | POA: Insufficient documentation

## 2017-05-05 DIAGNOSIS — M129 Arthropathy, unspecified: Secondary | ICD-10-CM | POA: Insufficient documentation

## 2017-05-05 DIAGNOSIS — Z5181 Encounter for therapeutic drug level monitoring: Secondary | ICD-10-CM | POA: Diagnosis not present

## 2017-05-05 DIAGNOSIS — I69398 Other sequelae of cerebral infarction: Secondary | ICD-10-CM | POA: Insufficient documentation

## 2017-05-05 DIAGNOSIS — M5136 Other intervertebral disc degeneration, lumbar region: Secondary | ICD-10-CM | POA: Diagnosis not present

## 2017-05-05 DIAGNOSIS — G894 Chronic pain syndrome: Secondary | ICD-10-CM | POA: Diagnosis not present

## 2017-05-05 DIAGNOSIS — M4696 Unspecified inflammatory spondylopathy, lumbar region: Secondary | ICD-10-CM | POA: Diagnosis not present

## 2017-05-05 DIAGNOSIS — M47816 Spondylosis without myelopathy or radiculopathy, lumbar region: Secondary | ICD-10-CM

## 2017-05-05 DIAGNOSIS — M479 Spondylosis, unspecified: Secondary | ICD-10-CM | POA: Insufficient documentation

## 2017-05-05 DIAGNOSIS — M25561 Pain in right knee: Secondary | ICD-10-CM | POA: Diagnosis not present

## 2017-05-05 DIAGNOSIS — M7061 Trochanteric bursitis, right hip: Secondary | ICD-10-CM | POA: Diagnosis not present

## 2017-05-05 MED ORDER — HYDROCODONE-ACETAMINOPHEN 7.5-325 MG PO TABS: 1.0000 | ORAL_TABLET | Freq: Three times a day (TID) | ORAL | 0 refills | Status: DC | PRN

## 2017-05-05 NOTE — Progress Notes (Signed)
Subjective:    Patient ID: Brittany Crawford, female    DOB: 10/21/1955, 61 y.o.   MRN: [id number]  HPI: [name] is a 61year old female who returns for follow up appointmentfor chronic pain and medication refill. She states her pain is located in her lower back. She rates her pain 5.Her current exercise regime is walking, pool therapy and performing stretching exercises QOD.   Also states she hasn't smoked in 8 weeks encouraged to continue with smoking cessation.   On 04/17/2017 she underwent Repair Strabismus of her Left Eye by Dr. Annamaria Boots.   Last UDS was performed 02/11/2017, it was reviewed.     Pain Inventory Average Pain 4 Pain Right Now 5 My pain is .  In the last 24 hours, has pain interfered with the following? General activity 7 Relation with others 8 Enjoyment of life 10 What TIME of day is your pain at its worst? evening, morning Sleep (in general) .  Pain is worse with: bending and standing Pain improves with: rest, heat/ice, therapy/exercise and medication Relief from Meds: .  Mobility ability to climb steps?  yes do you drive?  no  Function disabled: date disabled .  Neuro/Psych No problems in this area  Prior Studies .  Physicians involved in your care .   Family History  Problem Relation Age of Onset  . Colon cancer Maternal Uncle   . Stroke Mother    Social History   Social History  . Marital status: Married    Spouse name: N/A  . Number of children: 2  . Years of education: 63   Social History Main Topics  . Smoking status: Former Smoker    Packs/day: 0.00    Years: 0.00    Types: Cigarettes    Quit date: 03/13/2017  . Smokeless tobacco: Never Used     Comment: 4  to 5  cigarettes daily for years  . Alcohol use 0.6 oz/week    1 Glasses of wine per week     Comment: occasionaly   . Drug use: No  . Sexual activity: Yes   Other Topics Concern  . None   Social History Narrative   Patient is married with 2  children.   Patient is right handed.   Patient has hs education.   Patient drinks 4 cups daily.   Past Surgical History:  Procedure Laterality Date  . BREAST BIOPSY Left 04/29/2016  . BREAST BIOPSY Left 04/23/2016  . DILATATION & CURRETTAGE/HYSTEROSCOPY WITH RESECTOCOPE N/A 12/10/2012   Procedure: DILATATION & CURETTAGE/HYSTEROSCOPY WITH RESECTOCOPE;  Surgeon: Marvene Staff, MD;  Location: [facility];  Service: Gynecology;  Laterality: N/A;  . FOOT SURGERY     left-pins placed  . LYMPH NODE BIOPSY    . POLYPECTOMY N/A 12/10/2012   Procedure: POLYPECTOMY;  Surgeon: Marvene Staff, MD;  Location: [facility];  Service: Gynecology;  Laterality: N/A;  . STRABISMUS SURGERY Left 04/17/2017   Procedure: REPAIR STRABISMUS LEFT EYE;  Surgeon: Everitt Amber, MD;  Location: [place];  Service: Ophthalmology;  Laterality: Left;   Past Medical History:  Diagnosis Date  . Chronic back pain   . H/O sarcoidosis   . Hypertension   . Seasonal allergies   . Stroke ([place])    BP 118/75   Pulse 79   Temp 98.6 F (37 C)   LMP 11/13/2012   SpO2 95%   Opioid Risk Score:   Fall Risk Score:  `1  Depression  screen PHQ 2/9  Depression screen [facility] 2/9 02/11/2017 12/12/2016 04/28/2016 11/22/2015 06/26/2015 05/15/2015 01/02/2015  Decreased Interest 0 0 0 0 0 0 0  Down, Depressed, Hopeless 0 0 0 0 0 0 0  PHQ - 2 Score 0 0 0 0 0 0 0  Altered sleeping - - - - - - 0  Tired, decreased energy - - - - - - 0  Change in appetite - - - - - - 0  Feeling bad or failure about yourself  - - - - - - 0  Trouble concentrating - - - - - - 0  Moving slowly or fidgety/restless - - - - - - 1  Suicidal thoughts - - - - - - 0  PHQ-9 Score - - - - - - 1     Review of Systems  Constitutional: Negative.   HENT: Negative.   Eyes: Negative.   Respiratory: Negative.   Cardiovascular: Negative.   Gastrointestinal: Negative.   Endocrine: Negative.   Genitourinary: Negative.   Musculoskeletal: Negative.   Skin:  Negative.   Allergic/Immunologic: Negative.   Neurological: Negative.   Hematological: Negative.   Psychiatric/Behavioral: Negative.   All other systems reviewed and are negative.      Objective:   Physical Exam  Constitutional: She is oriented to person, place, and time. She appears well-developed and well-nourished.  HENT:  Head: Normocephalic and atraumatic.  Neck: Normal range of motion. Neck supple.  Cardiovascular: Normal rate and regular rhythm.   Pulmonary/Chest: Effort normal and breath sounds normal. No respiratory distress.  Musculoskeletal:  Normal Muscle Bulk and Muscle Testing Reveals:  Upper Extremities: Full ROM and Muscle Strength 5/5 Back without spinal tenderness  Lower Extremities: Full ROM and Muscle Strength 5/5 Arises from Table with ease using cane for support Narrow Based Gait  Neurological: She is alert and oriented to person, place, and time.  Skin: Skin is warm and dry.  Psychiatric: She has a normal mood and affect.  Nursing note and vitals reviewed.         Assessment & Plan:  1. Lumbar spondylosis with DDD and facet arthropathy. 05/05/2017 Refilled:Hydrocodone 7.5 /325 mg one tablet every 8 hours as needed for pain #75. We will continue the opioid monitoring program, this consists of regular clinic visits, examinations, urine drug screen, pill counts as well as use of [place] Controlled Substance Reporting System. 2. Bilateral knee pain: No complaints voiced today:Continue with heat/ice, exercise and Voltaren Gel. Alternate with Mobic. 05/05/2017 3. Right thalamic/internal capsule lacunar infarct with persistent left hemisensory deficits: Continue to Monitor. 05/05/2017 4. Bilateral Greater Trochanteric Bursitis: No complaints today.Continue current medication regime. 05/05/2017  20 minutes of face to face patient care time was spent during this visit. All questions were encouraged and answered.   F/U in 1 month

## 2017-05-12 LAB — DRUG TOX MONITOR 1 W/CONF, ORAL FLD
Amphetamines: NEGATIVE ng/mL (ref <10)
BARBITURATES: NEGATIVE ng/mL (ref <10)
BENZODIAZEPINES: NEGATIVE ng/mL (ref <0.50)
Buprenorphine: NEGATIVE ng/mL (ref <0.025)
CODEINE: NEGATIVE ng/mL (ref <2.5)
Cocaine: NEGATIVE ng/mL (ref <2.5)
DIHYDROCODEINE: 3.7 ng/mL — AB (ref <2.5)
FENTANYL: NEGATIVE ng/mL (ref <0.10)
HYDROCODONE: 40.8 ng/mL — AB (ref <2.5)
Heroin Metabolite: NEGATIVE ng/mL (ref <1.0)
Hydromorphone: NEGATIVE ng/mL (ref <2.5)
MDMA: NEGATIVE ng/mL (ref <10)
MEPERIDINE: NEGATIVE ng/mL (ref <5.0)
MEPROBAMATE: NEGATIVE ng/mL (ref <2.5)
Marijuana: NEGATIVE ng/mL (ref <2.5)
Methadone: NEGATIVE ng/mL (ref <5.0)
Morphine: NEGATIVE ng/mL (ref <2.5)
Nicotine Metabolite: NEGATIVE ng/mL (ref <5.0)
Norhydrocodone: NEGATIVE ng/mL (ref <2.5)
Noroxycodone: NEGATIVE ng/mL (ref <2.5)
OPIATES: POSITIVE ng/mL — AB (ref <2.5)
OXYMORPHONE: NEGATIVE ng/mL (ref <2.5)
Oxycodone: NEGATIVE ng/mL (ref <2.5)
PHENCYCLIDINE: NEGATIVE ng/mL (ref <10)
PROPOXYPHENE: NEGATIVE ng/mL (ref <5.0)
Tapentadol: NEGATIVE ng/mL (ref <5.0)
Tramadol: NEGATIVE ng/mL (ref <5.0)
Zolpidem: NEGATIVE ng/mL (ref <5.0)

## 2017-05-12 LAB — DRUG TOX ALC METAB W/CON, ORAL FLD: Alcohol Metabolite: NEGATIVE ng/mL (ref <25)

## 2017-05-14 ENCOUNTER — Telehealth: Payer: Self-pay | Admitting: *Deleted

## 2017-05-14 NOTE — Telephone Encounter (Signed)
Oral swab drug screen was consistent for prescribed medications.  ?

## 2017-06-10 ENCOUNTER — Encounter: Payer: Self-pay | Admitting: Physical Medicine & Rehabilitation

## 2017-06-10 ENCOUNTER — Encounter: Payer: Medicare Other | Attending: Physical Medicine & Rehabilitation | Admitting: Physical Medicine & Rehabilitation

## 2017-06-10 DIAGNOSIS — M4696 Unspecified inflammatory spondylopathy, lumbar region: Secondary | ICD-10-CM | POA: Diagnosis not present

## 2017-06-10 DIAGNOSIS — M5136 Other intervertebral disc degeneration, lumbar region: Secondary | ICD-10-CM | POA: Insufficient documentation

## 2017-06-10 DIAGNOSIS — I69398 Other sequelae of cerebral infarction: Secondary | ICD-10-CM | POA: Insufficient documentation

## 2017-06-10 DIAGNOSIS — M545 Low back pain: Secondary | ICD-10-CM | POA: Insufficient documentation

## 2017-06-10 DIAGNOSIS — M129 Arthropathy, unspecified: Secondary | ICD-10-CM | POA: Diagnosis not present

## 2017-06-10 DIAGNOSIS — M25561 Pain in right knee: Secondary | ICD-10-CM | POA: Insufficient documentation

## 2017-06-10 DIAGNOSIS — H53462 Homonymous bilateral field defects, left side: Secondary | ICD-10-CM | POA: Diagnosis not present

## 2017-06-10 DIAGNOSIS — M479 Spondylosis, unspecified: Secondary | ICD-10-CM | POA: Diagnosis not present

## 2017-06-10 DIAGNOSIS — M47816 Spondylosis without myelopathy or radiculopathy, lumbar region: Secondary | ICD-10-CM | POA: Diagnosis not present

## 2017-06-10 MED ORDER — DICLOFENAC SODIUM 75 MG PO TBEC: 75.0000 mg | DELAYED_RELEASE_TABLET | Freq: Two times a day (BID) | ORAL | 3 refills | Status: DC | PRN

## 2017-06-10 MED ORDER — HYDROCODONE-ACETAMINOPHEN 7.5-325 MG PO TABS: 1.0000 | ORAL_TABLET | Freq: Three times a day (TID) | ORAL | 0 refills | Status: DC | PRN

## 2017-06-10 NOTE — Patient Instructions (Signed)
REMEMBER TO USE HEAT, ICE, MASSAGE, STRETCHING TO HELP PAIN. FIND ACTIVITIES TO GET YOUR MIND OFF YOUR PAIN AS WELL   PLEASE FEEL FREE TO CALL OUR OFFICE WITH ANY PROBLEMS OR QUESTIONS ([phone])

## 2017-06-10 NOTE — Progress Notes (Addendum)
Subjective:    Patient ID: Brittany Crawford, female    DOB: 19-Jan-1956, 61 y.o.   MRN: [id number]  HPI   [name] is here in follow up of her chronic pain. She quit smoking almost 3 months ago!!  She's quite proud. Her summer has been busy. She has gotten into swimming and more walking. She is still having regular back pain which bothers her when she's more active. Heat and ice do seem to help. She is taking hydrocodone 2-3 per day for brekathrough pain. She is not using meloxicam regularly  She wants to lose weight but is finding it a bit difficult. She is trying to eat better and live more healthy as a whole!   Pain Inventory Average Pain 7 Pain Right Now 4 My pain is constant  In the last 24 hours, has pain interfered with the following? General activity 7 Relation with others 10 Enjoyment of life 9 What TIME of day is your pain at its worst? evening, night Sleep (in general) Poor  Pain is worse with: bending, standing and some activites Pain improves with: rest, heat/ice and medication Relief from Meds: 10  Mobility walk without assistance walk with assistance use a cane ability to climb steps?  yes do you drive?  no Do you have any goals in this area?  yes  Function disabled: date disabled . Do you have any goals in this area?  yes  Neuro/Psych No problems in this area  Prior Studies Any changes since last visit?  no  Physicians involved in your care Any changes since last visit?  no   Family History  Problem Relation Age of Onset  . Colon cancer Maternal Uncle   . Stroke Mother    Social History   Social History  . Marital status: Married    Spouse name: N/A  . Number of children: 2  . Years of education: 59   Social History Main Topics  . Smoking status: Former Smoker    Packs/day: 0.00    Years: 0.00    Types: Cigarettes    Quit date: 03/13/2017  . Smokeless tobacco: Never Used     Comment: 4  to 5  cigarettes daily for years  . Alcohol  use 0.6 oz/week    1 Glasses of wine per week     Comment: occasionaly   . Drug use: No  . Sexual activity: Yes   Other Topics Concern  . None   Social History Narrative   Patient is married with 2 children.   Patient is right handed.   Patient has hs education.   Patient drinks 4 cups daily.   Past Surgical History:  Procedure Laterality Date  . BREAST BIOPSY Left 04/29/2016  . BREAST BIOPSY Left 04/23/2016  . DILATATION & CURRETTAGE/HYSTEROSCOPY WITH RESECTOCOPE N/A 12/10/2012   Procedure: DILATATION & CURETTAGE/HYSTEROSCOPY WITH RESECTOCOPE;  Surgeon: Marvene Staff, MD;  Location: [facility];  Service: Gynecology;  Laterality: N/A;  . FOOT SURGERY     left-pins placed  . LYMPH NODE BIOPSY    . POLYPECTOMY N/A 12/10/2012   Procedure: POLYPECTOMY;  Surgeon: Marvene Staff, MD;  Location: [facility];  Service: Gynecology;  Laterality: N/A;  . STRABISMUS SURGERY Left 04/17/2017   Procedure: REPAIR STRABISMUS LEFT EYE;  Surgeon: Everitt Amber, MD;  Location: [place];  Service: Ophthalmology;  Laterality: Left;   Past Medical History:  Diagnosis Date  . Chronic back pain   . H/O sarcoidosis   .  Hypertension   . Seasonal allergies   . Stroke ([place])    LMP 11/13/2012   Opioid Risk Score:   Fall Risk Score:  `1  Depression screen PHQ 2/9  Depression screen [facility] 2/9 02/11/2017 12/12/2016 04/28/2016 11/22/2015 06/26/2015 05/15/2015 01/02/2015  Decreased Interest 0 0 0 0 0 0 0  Down, Depressed, Hopeless 0 0 0 0 0 0 0  PHQ - 2 Score 0 0 0 0 0 0 0  Altered sleeping - - - - - - 0  Tired, decreased energy - - - - - - 0  Change in appetite - - - - - - 0  Feeling bad or failure about yourself  - - - - - - 0  Trouble concentrating - - - - - - 0  Moving slowly or fidgety/restless - - - - - - 1  Suicidal thoughts - - - - - - 0  PHQ-9 Score - - - - - - 1    Review of Systems  Constitutional: Negative.   HENT: Negative.   Eyes: Negative.   Respiratory: Negative.     Cardiovascular: Negative.   Gastrointestinal: Negative.   Endocrine: Negative.   Genitourinary: Negative.   Musculoskeletal: Positive for arthralgias and back pain.  Skin: Negative.   Allergic/Immunologic: Negative.   Neurological: Negative.   Hematological: Negative.   Psychiatric/Behavioral: Negative.        Objective:   Physical Exam  General: Alert and oriented x 3, No apparent distress  HEENT:Head is normocephalic, atraumatic, PERRLA, EOMI, sclera anicteric, oral mucosa pink and moist, dentition intact, ext ear canals clear,  Neck:no jvd Heart:RRR Chest: CTA B Abdomen:Soft, non-tender, non-distended, bowel sounds positive.  Extremities:No clubbing, cyanosis, or edema. Pulses are 2+  Skin:Clean and intact without signs of breakdown  Neuro:balance stable.  Cognitively intactNo obvious facial weakness. Sensory exam is normal on right and 1+ on left face, arm, leg Reflexes are 1+ in all 4's. Fine motor coordination is intact. No tremors. Motor function is grossly 5/5 in all 4s. No resting tone on the left.  Musculoskeletal:facet maneuvers provocative. SST, SLR equivocal. Mild TTP lumbar spine Psych:Pt's affect is pleasant  Assessment & Plan:  1. Lumbar spondylosis with DDD and facet arthropathy. Sx appear most prominent at L5-S1 on exam  2. Right knee pain---appears minimal on exam today  3. Right thalamic/internal capsule lacunar infarct with persistent left hemisensory deficits and pain syndrome.  4. ?old BI as youth   5. Hx of marijuana use.    Plan:  1. Will change meloxicam to diclofenac to use for breakthrough pain 2. Consider lumbar medial branch blocks bilaterally at L4-5 and L5-S1. She is hesitant to pursue injections given responses in the past. .  3. Hydrocodone 5/325 one q8 prn for breakthrough pain---continue at #75 4. Continue with HEP. Reviewed use of heat, ice, massage, ?TENS 5. Reviewed  flexion exercises.  6. Follow up with me orNP in  38months.15 minutes of face to face patient care time were spent during this visit. All questions were encouraged and answered.

## 2017-07-13 ENCOUNTER — Emergency Department (HOSPITAL_COMMUNITY)
Admission: EM | Admit: 2017-07-13 | Discharge: 2017-07-13 | Disposition: A | Discharge status: Home / Self Care | Payer: Medicare Other | Attending: Physician Assistant | Admitting: Physician Assistant

## 2017-07-13 ENCOUNTER — Encounter (HOSPITAL_COMMUNITY): Payer: Self-pay | Admitting: Emergency Medicine

## 2017-07-13 ENCOUNTER — Emergency Department (HOSPITAL_COMMUNITY): Payer: Medicare Other

## 2017-07-13 DIAGNOSIS — L600 Ingrowing nail: Secondary | ICD-10-CM

## 2017-07-13 DIAGNOSIS — Z8673 Personal history of transient ischemic attack (TIA), and cerebral infarction without residual deficits: Secondary | ICD-10-CM | POA: Diagnosis not present

## 2017-07-13 DIAGNOSIS — I10 Essential (primary) hypertension: Secondary | ICD-10-CM | POA: Insufficient documentation

## 2017-07-13 DIAGNOSIS — Z79899 Other long term (current) drug therapy: Secondary | ICD-10-CM | POA: Insufficient documentation

## 2017-07-13 DIAGNOSIS — Z87891 Personal history of nicotine dependence: Secondary | ICD-10-CM | POA: Insufficient documentation

## 2017-07-13 DIAGNOSIS — M79671 Pain in right foot: Secondary | ICD-10-CM | POA: Diagnosis not present

## 2017-07-13 DIAGNOSIS — Z7982 Long term (current) use of aspirin: Secondary | ICD-10-CM | POA: Diagnosis not present

## 2017-07-13 NOTE — ED Notes (Signed)
Pt reports increasing pain if anything touches her right great toe. She states that it use to be on the left side of the toe until she cut her toenail down, now it has moved to the right side of the right great toe. Toenail appears to be ingrown.

## 2017-07-13 NOTE — ED Provider Notes (Signed)
[place] DEPT Provider Note   CSN: [id number] Arrival date & time: 07/13/17  9323     History   Chief Complaint Chief Complaint  Patient presents with  . Foot Pain    HPI NONIE LOCHNER is a 61 y.o. female who presents emergency department today for 1 year of right hip pain. The patient states that the area pain is on her right lateral toenail, characterized as dull and is painful with ambulation and when wearing tight shoes. She has tried warm soaks and Tylenol for this area without any relief. No red streaking, erythema, warmth, fever, chills, numbness/tingiling, or drainage. No history of gout.   HPI  Past Medical History:  Diagnosis Date  . Chronic back pain   . H/O sarcoidosis   . Hypertension   . Seasonal allergies   . Stroke [facility])     Patient Active Problem List   Diagnosis Date Noted  . Neuropathic pain 10/03/2015  . Chronic pain syndrome 08/03/2015  . Right-sided lacunar infarction 12/04/2014  . Spondylosis of lumbar region without myelopathy or radiculopathy 12/04/2014  . Lumbar facet arthropathy 12/04/2014  . Marijuana abuse 07/07/2014  . Other and unspecified hyperlipidemia 07/07/2014  . CVA (cerebral infarction) 07/06/2014  . Essential hypertension, benign 07/06/2014    Past Surgical History:  Procedure Laterality Date  . BREAST BIOPSY Left 04/29/2016  . BREAST BIOPSY Left 04/23/2016  . DILATATION & CURRETTAGE/HYSTEROSCOPY WITH RESECTOCOPE N/A 12/10/2012   Procedure: DILATATION & CURETTAGE/HYSTEROSCOPY WITH RESECTOCOPE;  Surgeon: Marvene Staff, MD;  Location: [place] [facility];  Service: Gynecology;  Laterality: N/A;  . FOOT SURGERY     left-pins placed  . LYMPH NODE BIOPSY    . POLYPECTOMY N/A 12/10/2012   Procedure: POLYPECTOMY;  Surgeon: Marvene Staff, MD;  Location: [place] [facility];  Service: Gynecology;  Laterality: N/A;  . STRABISMUS SURGERY Left 04/17/2017   Procedure: REPAIR STRABISMUS LEFT EYE;  Surgeon: Everitt Amber, MD;  Location: [place];  Service: Ophthalmology;  Laterality: Left;    OB History    Gravida Para Term Preterm AB Living   5 2 2   3 2    SAB TAB Ectopic Multiple Live Births     3             Home Medications    Prior to Admission medications   Medication Sig Start Date End Date Taking? Authorizing Provider  amLODipine (NORVASC) 5 MG tablet Take 1 tablet (5 mg total) by mouth daily. 12/12/13  Yes Harden Mo, MD  aspirin 325 MG tablet Take 1 tablet (325 mg total) by mouth daily. 07/07/14  Yes Bonnielee Haff, MD  cetirizine (ZYRTEC) 10 MG tablet Take 10 mg by mouth daily as needed for allergies.   Yes [provider]  diclofenac (VOLTAREN) 75 MG EC tablet Take 1 tablet (75 mg total) by mouth 2 (two) times daily as needed. 06/10/17  Yes Meredith Staggers, MD  diclofenac sodium (VOLTAREN) 1 % GEL Apply 2 g topically QID. Use as directed on right  hip and  Right knee 12/12/16  Yes Bayard Hugger, NP  HYDROcodone-acetaminophen (NORCO) 7.5-325 MG tablet Take 1 tablet by mouth every 8 (eight) hours as needed for moderate pain. 06/10/17  Yes Meredith Staggers, MD  Hypromellose (ARTIFICIAL TEARS OP) Place 1 drop into the right eye daily as needed (for dry eyes).   Yes [provider]  Multiple Vitamin (MULTIVITAMIN WITH MINERALS) TABS tablet Take 1 tablet by mouth daily.  Yes [provider]  Multiple Vitamins-Minerals (WOMENS ONE DAILY PO) Take 1 tablet by mouth daily.   Yes [provider]  Olopatadine HCl 0.2 % SOLN Apply 1 drop to eye daily as needed for allergies. 06/18/17  Yes [provider]  Pitavastatin Calcium (LIVALO) 2 MG TABS Take 2 mg by mouth.   Yes [provider]  prednisoLONE acetate (PRED FORTE) 1 % ophthalmic suspension Place 1 drop into the left eye as needed (flare ups).    Yes [provider]  triamcinolone cream (KENALOG) 0.1 % Apply 1 application topically at bedtime as needed (for rash). 07/08/14  Yes Bonnielee Haff, MD  valsartan-hydrochlorothiazide (DIOVAN-HCT) 160-12.5 MG per tablet Take 1 tablet by mouth daily. 03/14/15  Yes [provider]  tobramycin-dexamethasone (TOBRADEX) ophthalmic ointment Place 1 application into the left eye 2 (two) times daily. Patient not taking: Reported on 07/13/2017 04/17/17   Everitt Amber, MD    Family History Family History  Problem Relation Age of Onset  . Colon cancer Maternal Uncle   . Stroke Mother     Social History Social History  Substance Use Topics  . Smoking status: Former Smoker    Packs/day: 0.00    Years: 0.00    Types: Cigarettes    Quit date: 03/13/2017  . Smokeless tobacco: Never Used     Comment: 4  to 5  cigarettes daily for years  . Alcohol use 0.6 oz/week    1 Glasses of wine per week     Comment: occasionaly      Allergies   Patient has no known allergies.   Review of Systems Review of Systems  Constitutional: Negative for chills and fever.  Musculoskeletal: Positive for arthralgias (right great toe). Negative for joint swelling.  Skin: Negative for wound.  Neurological: Negative for weakness and numbness.     Physical Exam Updated Vital Signs BP 105/74 (BP Location: Left Arm)   Pulse 88   Temp 98.3 F (36.8 C) (Oral)   Resp 16   LMP 11/13/2012   SpO2 98%   Physical Exam  Constitutional: She appears well-developed and well-nourished.  HENT:  Head: Normocephalic and atraumatic.  Right Ear: External ear normal.  Left Ear: External ear normal.  Eyes: Conjunctivae are normal. Right eye exhibits no discharge. Left eye exhibits no discharge. No scleral icterus.  Pulmonary/Chest: Effort normal. No respiratory distress.  Musculoskeletal:  ROM of right great toe normal. Normal sensation.   Neurological: She is alert. She has normal strength. No sensory deficit.  Skin: Skin is warm, dry and intact. Capillary refill takes less than 2 seconds. No pallor.  Right toenail with ingrown toenail on lateral aspect.  Removed portion of medial nail. No surrounding drainage or warmth. Minimal to no redness.   Psychiatric: She has a normal mood and affect.  Nursing note and vitals reviewed.    ED Treatments / Results  Labs (all labs ordered are listed, but only abnormal results are displayed) Labs Reviewed - No data to display  EKG  EKG Interpretation None       Radiology Dg Foot Complete Right  Result Date: 07/13/2017 CLINICAL DATA:  Pain medially for 5 days EXAM: RIGHT FOOT COMPLETE - 3+ VIEW COMPARISON:  None. FINDINGS: Frontal, oblique, and lateral views were obtained. There is no fracture or dislocation. Joint spaces appear normal. No erosive change or bony destruction. No soft tissue air. IMPRESSION: No fracture or dislocation.  No evident arthropathy. Electronically Signed   By: Gwyndolyn Saxon  Jasmine December III M.D.   On: 07/13/2017 09:59    Procedures Procedures (including critical care time)  Medications Ordered in ED Medications - No data to display   Initial Impression / Assessment and Plan / ED Course  I have reviewed the triage vital signs and the nursing notes.  Pertinent labs & imaging results that were available during my care of the patient were reviewed by me and considered in my medical decision making (see chart for details).     Patient with ingrown toenail. No signs of infection on exam. Normal ROM. NVI. Do not suspect gout or septic joint. Will refer patient to podiatry. Advised warm soaks and prevention.   Final Clinical Impressions(s) / ED Diagnoses   Final diagnoses:  Ingrown toenail without infection    New Prescriptions Discharge Medication List as of 07/13/2017 11:24 AM       Jillyn Ledger, PA-C 07/13/17 1723    [place], Fredia Sorrow, MD 07/14/17 2345

## 2017-07-13 NOTE — Discharge Instructions (Signed)
Please read and follow all provided instructions.  Your diagnoses today include:  1. Ingrown toenail without infection   An ingrown toenail occurs when the corner or sides of your toenail grow into the surrounding skin. The big toe is most commonly affected, but it can happen to any of your toes. If your ingrown toenail is not treated, you will be at risk for infection. I would like you to do daily soaks as you are already doing and follow up with podiatry for further treatment.   Tests performed today include: Vital signs. See below for your results today.  Xray - no signs of fracture or dislocation.   Home care instructions:  Follow any educational materials contained in this packet.   Follow-up instructions: Please follow-up with Podiatry for further evaluation of symptoms and treatment   Return instructions:  Please return to the Emergency Department if you do not get better, if you get worse, or new symptoms OR  You have red streaks that start at your foot and go up your leg. You have a fever. You have increased redness, swelling, or pain. You have fluid, blood, or pus coming from your toenail. Please return if you have any other emergent concerns.  Additional Information:  Your vital signs today were: BP 132/76 (BP Location: Left Arm)    Pulse 91    Temp 98.3 F (36.8 C) (Oral)    Resp 20    LMP 11/13/2012    SpO2 97%  If your blood pressure (BP) was elevated above 135/85 this visit, please have this repeated by your doctor within one month.

## 2017-07-13 NOTE — ED Triage Notes (Signed)
Pt to ER for evaluation of right toe pain onset "a while ago." no obvious deformity, no swelling, no hx of gout. Ambulatory without difficulty.

## 2017-07-20 ENCOUNTER — Ambulatory Visit (INDEPENDENT_AMBULATORY_CARE_PROVIDER_SITE_OTHER): Payer: Medicare Other | Admitting: Podiatry

## 2017-07-20 ENCOUNTER — Ambulatory Visit (INDEPENDENT_AMBULATORY_CARE_PROVIDER_SITE_OTHER): Payer: Medicare Other

## 2017-07-20 DIAGNOSIS — L600 Ingrowing nail: Secondary | ICD-10-CM | POA: Diagnosis not present

## 2017-07-20 DIAGNOSIS — M79672 Pain in left foot: Secondary | ICD-10-CM

## 2017-07-20 DIAGNOSIS — M7752 Other enthesopathy of left foot: Secondary | ICD-10-CM

## 2017-07-20 DIAGNOSIS — M779 Enthesopathy, unspecified: Secondary | ICD-10-CM

## 2017-07-20 NOTE — Patient Instructions (Signed)

## 2017-07-21 NOTE — Progress Notes (Signed)
   Subjective: Patient presents today for evaluation of pain in toe to the medial border of the right great toe that has been present for an extended amount of time. Patient is concerned for possible ingrown nail. Patient presents today for further treatment and evaluation.  Past Medical History:  Diagnosis Date  . Chronic back pain   . H/O sarcoidosis   . Hypertension   . Seasonal allergies   . Stroke [facility])     Objective:  General: Well developed, nourished, in no acute distress, alert and oriented x3   Dermatology: Skin is warm, dry and supple bilateral. Medial border right great toe appears to be erythematous with evidence of an ingrowing nail. Pain on palpation noted to the border of the nail fold. The remaining nails appear unremarkable at this time. There are no open sores, lesions.  Vascular: Dorsalis Pedis artery and Posterior Tibial artery pedal pulses palpable. No lower extremity edema noted.   Neruologic: Grossly intact via light touch bilateral.  Musculoskeletal: Muscular strength within normal limits in all groups bilateral. Normal range of motion noted to all pedal and ankle joints.   Radiographic Exam:  Normal osseous mineralization. Joint spaces preserved. No fracture/dislocation/boney destruction.  Assesement: #1 Paronychia with ingrowing nail medial border right great toe #2 Pain in toe #3 Incurvated nail  Plan of Care:  1. Patient evaluated.  2. Discussed treatment alternatives and plan of care. Explained nail avulsion procedure and post procedure course to patient. 3. Patient opted for permanent partial nail avulsion.  4. Prior to procedure, local anesthesia infiltration utilized using 3 ml of a 50:50 mixture of 2% plain lidocaine and 0.5% plain marcaine in a normal hallux block fashion and a betadine prep performed.  5. Partial permanent nail avulsion with chemical matrixectomy performed using [id number] applications of phenol followed by alcohol flush.  6.  Light dressing applied. 7. Return to clinic in 2 weeks.   Edrick Kins, DPM [facility]  Dr. Edrick Kins, [place]                                        [place], [place] [address]                Office [phone]  Fax [phone]

## 2017-07-22 ENCOUNTER — Other Ambulatory Visit: Payer: Self-pay | Admitting: Podiatry

## 2017-07-22 DIAGNOSIS — M779 Enthesopathy, unspecified: Secondary | ICD-10-CM

## 2017-07-29 ENCOUNTER — Encounter: Payer: Self-pay | Admitting: Physical Medicine & Rehabilitation

## 2017-07-29 ENCOUNTER — Encounter: Payer: Medicare Other | Attending: Physical Medicine & Rehabilitation | Admitting: Physical Medicine & Rehabilitation

## 2017-07-29 VITALS — BP 116/80 | HR 84

## 2017-07-29 DIAGNOSIS — M5136 Other intervertebral disc degeneration, lumbar region: Secondary | ICD-10-CM | POA: Diagnosis not present

## 2017-07-29 DIAGNOSIS — M545 Low back pain: Secondary | ICD-10-CM | POA: Diagnosis not present

## 2017-07-29 DIAGNOSIS — M129 Arthropathy, unspecified: Secondary | ICD-10-CM | POA: Insufficient documentation

## 2017-07-29 DIAGNOSIS — H53462 Homonymous bilateral field defects, left side: Secondary | ICD-10-CM | POA: Insufficient documentation

## 2017-07-29 DIAGNOSIS — M792 Neuralgia and neuritis, unspecified: Secondary | ICD-10-CM

## 2017-07-29 DIAGNOSIS — I69398 Other sequelae of cerebral infarction: Secondary | ICD-10-CM | POA: Diagnosis not present

## 2017-07-29 DIAGNOSIS — M25561 Pain in right knee: Secondary | ICD-10-CM | POA: Insufficient documentation

## 2017-07-29 DIAGNOSIS — M47816 Spondylosis without myelopathy or radiculopathy, lumbar region: Secondary | ICD-10-CM | POA: Diagnosis not present

## 2017-07-29 DIAGNOSIS — M479 Spondylosis, unspecified: Secondary | ICD-10-CM | POA: Diagnosis not present

## 2017-07-29 MED ORDER — HYDROCODONE-ACETAMINOPHEN 7.5-325 MG PO TABS: 1.0000 | ORAL_TABLET | Freq: Three times a day (TID) | ORAL | 0 refills | Status: DC | PRN

## 2017-07-29 NOTE — Patient Instructions (Addendum)
YOU CAN MANAGE YOUR PAIN. WORK ON WAYS TO DISTRACT YOURSELF FROM PAIN, AND FIND NEW GOALS AND ASPIRATIONS.   CONTINUE WITH STRETCHING DAILY, ESPECIALLY BEFORE YOU EXERCISE OR GO TO BED. HEAT CAN HELP TOO   PLEASE FEEL FREE TO CALL OUR OFFICE WITH ANY PROBLEMS OR QUESTIONS ([phone])

## 2017-07-29 NOTE — Progress Notes (Signed)
Subjective:    Patient ID: Brittany Crawford, female    DOB: 12/29/1955, 61 y.o.   MRN: [id number]  HPI   Brittany Crawford is here in follow up of her chronic pain. She has had good results with the diclofenac. She is using it once or twice daily. She still struggles with sleep and is restless at nights. She doesn't always stretch and use her heat.   She just got over an ingrown right first toe and is feeling better. She is being followed by podiatry.   She continues to work on regular exercise at home. She tries to walk when she can.   Pain Inventory Average Pain 7 Pain Right Now 3 My pain is intermittent, tingling and aching  In the last 24 hours, has pain interfered with the following? General activity 5 Relation with others 10 Enjoyment of life 8 What TIME of day is your pain at its worst? evening Sleep (in general) Fair  Pain is worse with: bending and standing Pain improves with: rest, heat/ice, therapy/exercise and medication Relief from Meds: 10  Mobility use a cane ability to climb steps?  yes  Function disabled: date disabled .  Neuro/Psych trouble walking  Prior Studies Any changes since last visit?  no  Physicians involved in your care Any changes since last visit?  no   Family History  Problem Relation Age of Onset  . Colon cancer Maternal Uncle   . Stroke Mother    Social History   Social History  . Marital status: Married    Spouse name: N/A  . Number of children: 2  . Years of education: 33   Social History Main Topics  . Smoking status: Former Smoker    Packs/day: 0.00    Years: 0.00    Types: Cigarettes    Quit date: 03/13/2017  . Smokeless tobacco: Never Used     Comment: 4  to 5  cigarettes daily for years  . Alcohol use 0.6 oz/week    1 Glasses of wine per week     Comment: occasionaly   . Drug use: No  . Sexual activity: Yes   Other Topics Concern  . None   Social History Narrative   Patient is married with 2 children.   Patient is right handed.   Patient has hs education.   Patient drinks 4 cups daily.   Past Surgical History:  Procedure Laterality Date  . BREAST BIOPSY Left 04/29/2016  . BREAST BIOPSY Left 04/23/2016  . DILATATION & CURRETTAGE/HYSTEROSCOPY WITH RESECTOCOPE N/A 12/10/2012   Procedure: DILATATION & CURETTAGE/HYSTEROSCOPY WITH RESECTOCOPE;  Surgeon: Marvene Staff, MD;  Location: [place] [facility];  Service: Gynecology;  Laterality: N/A;  . FOOT SURGERY     left-pins placed  . LYMPH NODE BIOPSY    . POLYPECTOMY N/A 12/10/2012   Procedure: POLYPECTOMY;  Surgeon: Marvene Staff, MD;  Location: [facility];  Service: Gynecology;  Laterality: N/A;  . STRABISMUS SURGERY Left 04/17/2017   Procedure: REPAIR STRABISMUS LEFT EYE;  Surgeon: Everitt Amber, MD;  Location: [place];  Service: Ophthalmology;  Laterality: Left;   Past Medical History:  Diagnosis Date  . Chronic back pain   . H/O sarcoidosis   . Hypertension   . Seasonal allergies   . Stroke (HCC)    BP 116/80   Pulse 84   LMP 11/13/2012   SpO2 96%   Opioid Risk Score:   Fall Risk Score:  `1  Depression screen [facility] 2/9  Depression screen [facility] 2/9 02/11/2017 12/12/2016 04/28/2016 11/22/2015 06/26/2015 05/15/2015 01/02/2015  Decreased Interest 0 0 0 0 0 0 0  Down, Depressed, Hopeless 0 0 0 0 0 0 0  PHQ - 2 Score 0 0 0 0 0 0 0  Altered sleeping - - - - - - 0  Tired, decreased energy - - - - - - 0  Change in appetite - - - - - - 0  Feeling bad or failure about yourself  - - - - - - 0  Trouble concentrating - - - - - - 0  Moving slowly or fidgety/restless - - - - - - 1  Suicidal thoughts - - - - - - 0  PHQ-9 Score - - - - - - 1     Review of Systems  Constitutional: Negative.   HENT: Negative.   Eyes: Negative.   Respiratory: Negative.   Cardiovascular: Negative.   Gastrointestinal: Negative.   Endocrine: Negative.   Genitourinary: Negative.   Musculoskeletal: Negative.   Skin: Negative.   Allergic/Immunologic:  Negative.   Neurological: Negative.   Hematological: Negative.   Psychiatric/Behavioral: Negative.   All other systems reviewed and are negative.      Objective:   Physical Exam  General: Alert and oriented x 3, No apparent distress  HEENT:Head is normocephalic, atraumatic, PERRLA, EOMI, sclera anicteric, oral mucosa pink and moist, dentition intact, ext ear canals clear,  Neck:no jvd Heart:RRR Chest: CTA Abdomen:Soft, non-tender, non-distended, bowel sounds positive.  Extremities:No clubbing, cyanosis, or edema. Pulses are 2+  Skin:Clean and intact without signs of breakdown  Neuro:balance in tact.   Cognitively intactNo obvious facial weakness. Sensory exam is normal on right and 1+ on left face, arm, leg--stable.  Reflexes are 1+ in all 4's. Fine motor coordination is intact. No tremors. Motor function is grossly 5/5 in all 4s. No resting tone on the left.  Musculoskeletal:facet testing positive. SST, SLR equivocal. Mild TTP lumbar spine. Can bend and touch her toes. Psych:Pt's affect is pleasant  Assessment & Plan:  1. Lumbar spondylosis with DDD and facet arthropathy. Sx appear most prominent at L5-S1 on exam  2. Right knee pain---appears minimal on exam today  3. Right thalamic/internal capsule lacunar infarct with persistent left hemisensory deficits and pain syndrome.  4. ?old BI as youth   5. Hx of marijuana use.    Plan:  1. Continue diclofenac for breakthrough pain.  2. Consider lumbar medial branch blocks bilaterally at L4-5 and L5-S1, she remains hesitant to pursue 3. Hydrocodone 5/325 one q8 prn for breakthrough pain---continue at#75. We will continue the opioid monitoring program, this consists of regular clinic visits, examinations, routine drug screening, pill counts as well as use of [place] Controlled Substance Reporting System. [place] today.   4. Continue with HEP. Reviewed use of heat, ice, massage, TENS 5. Ongoing core,  flexion exercises needed. .  6. Follow up with  NP in 40months.15 minutes of face to face patient care time were spent during this visit. All questions were encouraged and answered.

## 2017-07-30 ENCOUNTER — Telehealth: Payer: Self-pay | Admitting: Physical Medicine & Rehabilitation

## 2017-07-30 NOTE — Telephone Encounter (Signed)
Referral for TENS UNIT faxed over to [facility] (ATTN: [place])

## 2017-08-03 ENCOUNTER — Ambulatory Visit: Payer: Medicare Other | Admitting: Neurology

## 2017-08-05 ENCOUNTER — Ambulatory Visit (INDEPENDENT_AMBULATORY_CARE_PROVIDER_SITE_OTHER): Payer: Medicare Other | Admitting: Podiatry

## 2017-08-05 ENCOUNTER — Encounter: Payer: Self-pay | Admitting: Podiatry

## 2017-08-05 DIAGNOSIS — L600 Ingrowing nail: Secondary | ICD-10-CM | POA: Diagnosis not present

## 2017-08-08 NOTE — Progress Notes (Signed)
   Subjective: Patient presents today 2 weeks post ingrown nail permanent nail avulsion procedure of the medial border of the right great toe. She denies any pain. She reports minimal drainage and bleeding. Patient states that the toe and nail fold is feeling much better.   Past Medical History:  Diagnosis Date  . Chronic back pain   . H/O sarcoidosis   . Hypertension   . Seasonal allergies   . Stroke [facility])     Objective: Skin is warm, dry and supple. Nail and respective nail fold appears to be healing appropriately. Open wound to the associated nail fold with a granular wound base and moderate amount of fibrotic tissue. Minimal drainage noted. Mild erythema around the periungual region likely due to phenol chemical matricectomy.  Assessment: #1 postop permanent partial nail avulsion medial border of the right great toe #2 open wound periungual nail fold of respective digit.   Plan of care: #1 patient was evaluated  #2 debridement of open wound was performed to the periungual border of the respective toe using a currette. Antibiotic ointment and Band-Aid was applied. #3 patient is to return to clinic on a PRN  basis.   Edrick Kins, DPM [facility]  Dr. Edrick Kins, [place]                                        [place], [place] [address]                Office [phone]  Fax [phone]

## 2017-09-08 ENCOUNTER — Other Ambulatory Visit: Payer: Self-pay

## 2017-09-08 NOTE — Telephone Encounter (Signed)
Patient called today, states has appointment on 09/28/2017, was last seen on 07/29/2017.   Was only given 1 month supply of pain medication, hydrocodone-Apap 7.5-325mg  with no second prescription, is worried that she will run out of pain medication prior to appointment.   checked PMP and last refill of medication was 08-04-17.   Please advise if we can give her a second prescription for pain medication or have her appointment moved sooner.

## 2017-09-09 ENCOUNTER — Telehealth: Payer: Self-pay | Admitting: *Deleted

## 2017-09-09 ENCOUNTER — Telehealth: Payer: Self-pay | Admitting: Registered Nurse

## 2017-09-09 DIAGNOSIS — M47816 Spondylosis without myelopathy or radiculopathy, lumbar region: Secondary | ICD-10-CM

## 2017-09-09 MED ORDER — HYDROCODONE-ACETAMINOPHEN 7.5-325 MG PO TABS: 1.0000 | ORAL_TABLET | Freq: Three times a day (TID) | ORAL | 0 refills | Status: DC | PRN

## 2017-09-09 NOTE — Telephone Encounter (Signed)
rx written

## 2017-09-09 NOTE — Telephone Encounter (Signed)
B[facility] sent you a phone call yesterday stating, "Patient called today, states has appointment on 09/28/2017, was last seen on 07/29/2017.   Was only given 1 month supply of pain medication, hydrocodone-Apap 7.5-325mg  with no second prescription, is worried that she will run out of pain medication prior to appointment.   checked PMP and last refill of medication was 08-04-17.   Please advise if we can give her a second prescription for pain medication or have her appointment moved sooner." Patient called again today. Last few clinic notes do not indicate second script or the fact that the script given is a 2 month supply.  Please advise

## 2017-09-09 NOTE — Telephone Encounter (Signed)
PMP-Aware Site Reviewed: Hydrocodone picked up on 08/04/2017. Prescription printed, left message for Brittany Crawford.

## 2017-09-09 NOTE — Telephone Encounter (Signed)
Notified patient that script is ready, she will pick up tomorrow

## 2017-09-24 DIAGNOSIS — E559 Vitamin D deficiency, unspecified: Secondary | ICD-10-CM | POA: Diagnosis not present

## 2017-09-24 DIAGNOSIS — Z0000 Encounter for general adult medical examination without abnormal findings: Secondary | ICD-10-CM | POA: Diagnosis not present

## 2017-09-24 DIAGNOSIS — G894 Chronic pain syndrome: Secondary | ICD-10-CM | POA: Diagnosis not present

## 2017-09-24 DIAGNOSIS — I10 Essential (primary) hypertension: Secondary | ICD-10-CM | POA: Diagnosis not present

## 2017-09-24 DIAGNOSIS — Z79899 Other long term (current) drug therapy: Secondary | ICD-10-CM | POA: Diagnosis not present

## 2017-09-24 DIAGNOSIS — Z23 Encounter for immunization: Secondary | ICD-10-CM | POA: Diagnosis not present

## 2017-09-24 DIAGNOSIS — R635 Abnormal weight gain: Secondary | ICD-10-CM | POA: Diagnosis not present

## 2017-09-24 DIAGNOSIS — R1032 Left lower quadrant pain: Secondary | ICD-10-CM | POA: Diagnosis not present

## 2017-09-28 ENCOUNTER — Telehealth: Payer: Self-pay | Admitting: Registered Nurse

## 2017-09-28 ENCOUNTER — Encounter: Payer: Self-pay | Admitting: Registered Nurse

## 2017-09-28 ENCOUNTER — Encounter: Payer: Medicare Other | Attending: Physical Medicine & Rehabilitation | Admitting: Registered Nurse

## 2017-09-28 ENCOUNTER — Other Ambulatory Visit: Payer: Self-pay

## 2017-09-28 VITALS — BP 115/72 | HR 85

## 2017-09-28 DIAGNOSIS — M25562 Pain in left knee: Secondary | ICD-10-CM | POA: Diagnosis not present

## 2017-09-28 DIAGNOSIS — Z79899 Other long term (current) drug therapy: Secondary | ICD-10-CM

## 2017-09-28 DIAGNOSIS — M25561 Pain in right knee: Secondary | ICD-10-CM | POA: Insufficient documentation

## 2017-09-28 DIAGNOSIS — I69398 Other sequelae of cerebral infarction: Secondary | ICD-10-CM | POA: Insufficient documentation

## 2017-09-28 DIAGNOSIS — M47816 Spondylosis without myelopathy or radiculopathy, lumbar region: Secondary | ICD-10-CM

## 2017-09-28 DIAGNOSIS — H53462 Homonymous bilateral field defects, left side: Secondary | ICD-10-CM | POA: Insufficient documentation

## 2017-09-28 DIAGNOSIS — M129 Arthropathy, unspecified: Secondary | ICD-10-CM | POA: Insufficient documentation

## 2017-09-28 DIAGNOSIS — M5136 Other intervertebral disc degeneration, lumbar region: Secondary | ICD-10-CM | POA: Diagnosis not present

## 2017-09-28 DIAGNOSIS — G894 Chronic pain syndrome: Secondary | ICD-10-CM | POA: Diagnosis not present

## 2017-09-28 DIAGNOSIS — M479 Spondylosis, unspecified: Secondary | ICD-10-CM | POA: Insufficient documentation

## 2017-09-28 DIAGNOSIS — M545 Low back pain: Secondary | ICD-10-CM | POA: Diagnosis not present

## 2017-09-28 DIAGNOSIS — M7062 Trochanteric bursitis, left hip: Secondary | ICD-10-CM

## 2017-09-28 DIAGNOSIS — G8929 Other chronic pain: Secondary | ICD-10-CM

## 2017-09-28 DIAGNOSIS — M7061 Trochanteric bursitis, right hip: Secondary | ICD-10-CM | POA: Diagnosis not present

## 2017-09-28 DIAGNOSIS — Z5181 Encounter for therapeutic drug level monitoring: Secondary | ICD-10-CM

## 2017-09-28 MED ORDER — HYDROCODONE-ACETAMINOPHEN 7.5-325 MG PO TABS: 1.0000 | ORAL_TABLET | Freq: Three times a day (TID) | ORAL | 0 refills | Status: DC | PRN

## 2017-09-28 NOTE — Telephone Encounter (Signed)
On 09/28/2017 the [place] was reviewed no conflict was seen on the [place] with multiple prescribers. Brittany Crawford  has a signed narcotic contract with our office. If there were any discrepancies this would have been reported to her physician.

## 2017-09-28 NOTE — Progress Notes (Signed)
Subjective:    Patient ID: Brittany Crawford, female    DOB: 1956/01/13, 61 y.o.   MRN: [id number]  HPI: [name] is a 61year old female who returns for follow up appointmentfor chronic pain and medication refill. She states her pain is located in her lower back and bilateral hips.. She rates her pain 3.Her current exercise regime is walking and performing stretching exercises QOD.   [name] Morphine equivalent is 22.50 MME  On 04/17/2017 she underwent Repair Strabismus of her Left Eye by Dr. Annamaria Boots.   Oral Swab was performed 05/05/2017, it was consistent..     Pain Inventory Average Pain 6 Pain Right Now 3 My pain is .  In the last 24 hours, has pain interfered with the following? General activity 4 Relation with others 4 Enjoyment of life 4 What TIME of day is your pain at its worst? evening, morning Sleep (in general) Poor  Pain is worse with: bending, standing and some activites Pain improves with: rest and heat/ice Relief from Meds: 8  Mobility use a cane ability to climb steps?  yes do you drive?  no  Function disabled: date disabled . I need assistance with the following:  . Do you have any goals in this area?  yes  Neuro/Psych No problems in this area  Prior Studies Any changes since last visit?  no .  Physicians involved in your care Any changes since last visit?  no .   Family History  Problem Relation Age of Onset  . Colon cancer Maternal Uncle   . Stroke Mother    Social History   Socioeconomic History  . Marital status: Married    Spouse name: None  . Number of children: 2  . Years of education: 73  . Highest education level: None  Social Needs  . Financial resource strain: None  . Food insecurity - worry: None  . Food insecurity - inability: None  . Transportation needs - medical: None  . Transportation needs - non-medical: None  Occupational History  . None  Tobacco Use  . Smoking status: Former Smoker   Packs/day: 0.00    Years: 0.00    Pack years: 0.00    Types: Cigarettes    Last attempt to quit: 03/13/2017    Years since quitting: 0.5  . Smokeless tobacco: Never Used  . Tobacco comment: 4  to 5  cigarettes daily for years  Substance and Sexual Activity  . Alcohol use: Yes    Alcohol/week: 0.6 oz    Types: 1 Glasses of wine per week    Comment: occasionaly   . Drug use: No  . Sexual activity: Yes  Other Topics Concern  . None  Social History Narrative   Patient is married with 2 children.   Patient is right handed.   Patient has hs education.   Patient drinks 4 cups daily.   Past Surgical History:  Procedure Laterality Date  . BREAST BIOPSY Left 04/29/2016  . BREAST BIOPSY Left 04/23/2016  . DILATATION & CURRETTAGE/HYSTEROSCOPY WITH RESECTOCOPE N/A 12/10/2012   Procedure: DILATATION & CURETTAGE/HYSTEROSCOPY WITH RESECTOCOPE;  Surgeon: Marvene Staff, MD;  Location: [facility];  Service: Gynecology;  Laterality: N/A;  . FOOT SURGERY     left-pins placed  . LYMPH NODE BIOPSY    . POLYPECTOMY N/A 12/10/2012   Procedure: POLYPECTOMY;  Surgeon: Marvene Staff, MD;  Location: [facility];  Service: Gynecology;  Laterality: N/A;  . STRABISMUS SURGERY Left 04/17/2017  Procedure: REPAIR STRABISMUS LEFT EYE;  Surgeon: Everitt Amber, MD;  Location: [facility];  Service: Ophthalmology;  Laterality: Left;   Past Medical History:  Diagnosis Date  . Chronic back pain   . H/O sarcoidosis   . Hypertension   . Seasonal allergies   . Stroke ([place])    LMP 11/13/2012   Opioid Risk Score:   Fall Risk Score:  `1  Depression screen PHQ 2/9  Depression screen [facility] 2/9 09/28/2017 02/11/2017 12/12/2016 04/28/2016 11/22/2015 06/26/2015 05/15/2015  Decreased Interest 0 0 0 0 0 0 0  Down, Depressed, Hopeless 0 0 0 0 0 0 0  PHQ - 2 Score 0 0 0 0 0 0 0  Altered sleeping - - - - - - -  Tired, decreased energy - - - - - - -  Change in appetite - - - - - - -  Feeling bad or failure about  yourself  - - - - - - -  Trouble concentrating - - - - - - -  Moving slowly or fidgety/restless - - - - - - -  Suicidal thoughts - - - - - - -  PHQ-9 Score - - - - - - -     Review of Systems  Constitutional: Negative.   HENT: Negative.   Eyes: Negative.   Respiratory: Negative.   Cardiovascular: Negative.   Gastrointestinal: Negative.   Endocrine: Negative.   Genitourinary: Negative.   Musculoskeletal: Negative.   Skin: Negative.   Allergic/Immunologic: Negative.   Neurological: Negative.   Hematological: Negative.   Psychiatric/Behavioral: Negative.   All other systems reviewed and are negative.      Objective:   Physical Exam  Constitutional: She is oriented to person, place, and time. She appears well-developed and well-nourished.  HENT:  Head: Normocephalic and atraumatic.  Neck: Normal range of motion. Neck supple.  Cardiovascular: Normal rate and regular rhythm.  Pulmonary/Chest: Effort normal and breath sounds normal. No respiratory distress.  Musculoskeletal:  Normal Muscle Bulk and Muscle Testing Reveals:  Upper Extremities: Full ROM and Muscle Strength 5/5 Lumbar Paraspinal Tenderness: L-4-L-5 Lower Extremities: Full ROM and Muscle Strength 5/5 Arises from Table with ease using cane for support Narrow Based Gait  Neurological: She is alert and oriented to person, place, and time.  Skin: Skin is warm and dry.  Psychiatric: She has a normal mood and affect.  Nursing note and vitals reviewed.         Assessment & Plan:  1. Lumbar spondylosis with DDD and facet arthropathy. 09/28/2017 Refilled:Hydrocodone 7.5 /325 mg one tablet every 8 hours as needed for pain #75. We will continue the opioid monitoring program, this consists of regular clinic visits, examinations, urine drug screen, pill counts as well as use of [place] Controlled Substance Reporting System. 2. Bilateral knee pain: No complaints voiced today:Continue with heat/ice, exercise and  Diclofenac. 09/28/2017 3. Right thalamic/internal capsule lacunar infarct with persistent left hemisensory deficits: Continue to Monitor. 09/28/2017 4. Bilateral Greater Trochanteric Bursitis: Continue current medication regime. 09/28/2017  20 minutes of face to face patient care time was spent during this visit. All questions were encouraged and answered.   F/U in 1 month

## 2017-11-30 ENCOUNTER — Encounter: Payer: Self-pay | Admitting: Registered Nurse

## 2017-11-30 ENCOUNTER — Encounter: Payer: Medicare Other | Attending: Physical Medicine & Rehabilitation | Admitting: Registered Nurse

## 2017-11-30 ENCOUNTER — Other Ambulatory Visit: Payer: Self-pay

## 2017-11-30 VITALS — BP 127/76 | HR 90

## 2017-11-30 DIAGNOSIS — Z5181 Encounter for therapeutic drug level monitoring: Secondary | ICD-10-CM

## 2017-11-30 DIAGNOSIS — M25561 Pain in right knee: Secondary | ICD-10-CM | POA: Diagnosis not present

## 2017-11-30 DIAGNOSIS — M129 Arthropathy, unspecified: Secondary | ICD-10-CM | POA: Diagnosis not present

## 2017-11-30 DIAGNOSIS — G894 Chronic pain syndrome: Secondary | ICD-10-CM

## 2017-11-30 DIAGNOSIS — M5136 Other intervertebral disc degeneration, lumbar region: Secondary | ICD-10-CM | POA: Insufficient documentation

## 2017-11-30 DIAGNOSIS — I69398 Other sequelae of cerebral infarction: Secondary | ICD-10-CM | POA: Diagnosis not present

## 2017-11-30 DIAGNOSIS — Z79891 Long term (current) use of opiate analgesic: Secondary | ICD-10-CM

## 2017-11-30 DIAGNOSIS — H53462 Homonymous bilateral field defects, left side: Secondary | ICD-10-CM | POA: Insufficient documentation

## 2017-11-30 DIAGNOSIS — M479 Spondylosis, unspecified: Secondary | ICD-10-CM | POA: Insufficient documentation

## 2017-11-30 DIAGNOSIS — M545 Low back pain: Secondary | ICD-10-CM | POA: Insufficient documentation

## 2017-11-30 DIAGNOSIS — Z79899 Other long term (current) drug therapy: Secondary | ICD-10-CM

## 2017-11-30 DIAGNOSIS — M47816 Spondylosis without myelopathy or radiculopathy, lumbar region: Secondary | ICD-10-CM

## 2017-11-30 MED ORDER — HYDROCODONE-ACETAMINOPHEN 7.5-325 MG PO TABS: 1.0000 | ORAL_TABLET | Freq: Three times a day (TID) | ORAL | 0 refills | Status: DC | PRN

## 2017-11-30 NOTE — Progress Notes (Signed)
Subjective:    Patient ID: Brittany Crawford, female    DOB: 1956-04-16, 62 y.o.   MRN: [id number]  HPI: Brittany Crawford is a 62year old female who returns for follow up appointmentfor chronic pain and medication refill. She states her pain is located in her lower back. She rates her pain 5. Her current exercise regime is walking, performing stretching exercises and riding her stationary bicycle for 3 minutes twice a week, encouraged to increase her stamina as tolerated and continue HEP as tolerated.  Ms. Schroyer Morphine equivalent is 18.00 MME  On 04/17/2017 she underwent Repair Strabismus of her Left Eye by Dr. Annamaria Boots.   Oral Swab was performed 05/05/2017, it was consistent..  UDS ordered today.   Pain Inventory Average Pain 4 Pain Right Now 5 My pain is tingling  In the last 24 hours, has pain interfered with the following? General activity 4 Relation with others 4 Enjoyment of life 4 What TIME of day is your pain at its worst? evening, morning Sleep (in general) Poor  Pain is worse with: walking, bending, standing and some activites Pain improves with: rest, heat/ice, therapy/exercise and medication Relief from Meds: 8  Mobility use a cane ability to climb steps?  yes do you drive?  no  Function disabled: date disabled . Do you have any goals in this area?  yes  Neuro/Psych No problems in this area  Prior Studies Any changes since last visit?  no .  Physicians involved in your care Any changes since last visit?  no .   Family History  Problem Relation Age of Onset  . Colon cancer Maternal Uncle   . Stroke Mother    Social History   Socioeconomic History  . Marital status: Married    Spouse name: Not on file  . Number of children: 2  . Years of education: 69  . Highest education level: Not on file  Social Needs  . Financial resource strain: Not on file  . Food insecurity - worry: Not on file  . Food insecurity - inability: Not on file    . Transportation needs - medical: Not on file  . Transportation needs - non-medical: Not on file  Occupational History  . Not on file  Tobacco Use  . Smoking status: Former Smoker    Packs/day: 0.00    Years: 0.00    Pack years: 0.00    Types: Cigarettes    Last attempt to quit: 03/13/2017    Years since quitting: 0.7  . Smokeless tobacco: Never Used  . Tobacco comment: 4  to 5  cigarettes daily for years  Substance and Sexual Activity  . Alcohol use: Yes    Alcohol/week: 0.6 oz    Types: 1 Glasses of wine per week    Comment: occasionaly   . Drug use: No  . Sexual activity: Yes  Other Topics Concern  . Not on file  Social History Narrative   Patient is married with 2 children.   Patient is right handed.   Patient has hs education.   Patient drinks 4 cups daily.   Past Surgical History:  Procedure Laterality Date  . BREAST BIOPSY Left 04/29/2016  . BREAST BIOPSY Left 04/23/2016  . DILATATION & CURRETTAGE/HYSTEROSCOPY WITH RESECTOCOPE N/A 12/10/2012   Procedure: DILATATION & CURETTAGE/HYSTEROSCOPY WITH RESECTOCOPE;  Surgeon: Marvene Staff, MD;  Location: [facility];  Service: Gynecology;  Laterality: N/A;  . FOOT SURGERY     left-pins placed  .  LYMPH NODE BIOPSY    . POLYPECTOMY N/A 12/10/2012   Procedure: POLYPECTOMY;  Surgeon: Marvene Staff, MD;  Location: [facility];  Service: Gynecology;  Laterality: N/A;  . STRABISMUS SURGERY Left 04/17/2017   Procedure: REPAIR STRABISMUS LEFT EYE;  Surgeon: Everitt Amber, MD;  Location: [facility];  Service: Ophthalmology;  Laterality: Left;   Past Medical History:  Diagnosis Date  . Chronic back pain   . H/O sarcoidosis   . Hypertension   . Seasonal allergies   . Stroke ([place])    BP 127/76   Pulse 90   LMP 11/13/2012   SpO2 93%   Opioid Risk Score:  5 Fall Risk Score:  `1  Depression screen PHQ 2/9  Depression screen [facility] 2/9 11/30/2017 09/28/2017 02/11/2017 12/12/2016 04/28/2016 11/22/2015 06/26/2015   Decreased Interest 0 0 0 0 0 0 0  Down, Depressed, Hopeless 0 0 0 0 0 0 0  PHQ - 2 Score 0 0 0 0 0 0 0  Altered sleeping - - - - - - -  Tired, decreased energy - - - - - - -  Change in appetite - - - - - - -  Feeling bad or failure about yourself  - - - - - - -  Trouble concentrating - - - - - - -  Moving slowly or fidgety/restless - - - - - - -  Suicidal thoughts - - - - - - -  PHQ-9 Score - - - - - - -     Review of Systems  Constitutional: Negative.   HENT: Negative.   Eyes: Negative.   Respiratory: Negative.   Cardiovascular: Negative.   Gastrointestinal: Negative.   Endocrine: Negative.   Genitourinary: Negative.   Musculoskeletal: Negative.   Skin: Negative.   Allergic/Immunologic: Negative.   Neurological:       Tingling  Hematological: Negative.   Psychiatric/Behavioral: Negative.   All other systems reviewed and are negative.      Objective:   Physical Exam  Constitutional: She is oriented to person, place, and time. She appears well-developed and well-nourished.  HENT:  Head: Normocephalic and atraumatic.  Neck: Normal range of motion. Neck supple.  Cardiovascular: Normal rate and regular rhythm.  Pulmonary/Chest: Effort normal and breath sounds normal. No respiratory distress.  Musculoskeletal:  Normal Muscle Bulk and Muscle Testing Reveals:  Upper Extremities: Full ROM and Muscle Strength 5/5 Lumbar Paraspinal Tenderness: L-3-L-5 Lower Extremities: Full ROM and Muscle Strength 5/5 Arises from Table with ease using cane for support Narrow Based Gait  Neurological: She is alert and oriented to person, place, and time.  Skin: Skin is warm and dry.  Psychiatric: She has a normal mood and affect.  Nursing note and vitals reviewed.         Assessment & Plan:  1. Lumbar spondylosis with DDD and facet arthropathy. 11/30/2017 Refilled:Hydrocodone 7.5 /325 mg one tablet every 8 hours as needed for pain #75. Second script given for the following  month. We will continue the opioid monitoring program, this consists of regular clinic visits, examinations, urine drug screen, pill counts as well as use of [place] Controlled Substance Reporting System. 2. Bilateral knee pain: No complaints voiced today:Continue with heat/ice, exercise and Diclofenac. 11/30/2017 3. Right thalamic/internal capsule lacunar infarct with persistent left hemisensory deficits: Continue to Monitor. 11/30/2017 4. Bilateral Greater Trochanteric Bursitis: No complaints Today. Continue current medication regime. 11/30/2017  20 minutes of face to face patient care time was spent during  this visit. All questions were encouraged and answered.   F/U in 1 month

## 2017-12-05 LAB — TOXASSURE SELECT,+ANTIDEPR,UR

## 2017-12-07 ENCOUNTER — Telehealth: Payer: Self-pay | Admitting: *Deleted

## 2017-12-07 NOTE — Telephone Encounter (Signed)
Urine drug screen for this encounter is consistent for prescribed medication 

## 2018-02-01 ENCOUNTER — Ambulatory Visit: Payer: Medicare Other | Admitting: Physical Medicine & Rehabilitation

## 2018-02-02 ENCOUNTER — Encounter: Payer: Self-pay | Admitting: Physical Medicine & Rehabilitation

## 2018-02-02 ENCOUNTER — Encounter: Payer: Medicare Other | Attending: Physical Medicine & Rehabilitation | Admitting: Physical Medicine & Rehabilitation

## 2018-02-02 ENCOUNTER — Other Ambulatory Visit: Payer: Self-pay

## 2018-02-02 VITALS — BP 105/60 | HR 82 | Ht 67.5 in | Wt 204.6 lb

## 2018-02-02 DIAGNOSIS — M479 Spondylosis, unspecified: Secondary | ICD-10-CM | POA: Diagnosis not present

## 2018-02-02 DIAGNOSIS — M5136 Other intervertebral disc degeneration, lumbar region: Secondary | ICD-10-CM | POA: Diagnosis not present

## 2018-02-02 DIAGNOSIS — G894 Chronic pain syndrome: Secondary | ICD-10-CM | POA: Diagnosis not present

## 2018-02-02 DIAGNOSIS — M545 Low back pain: Secondary | ICD-10-CM | POA: Diagnosis not present

## 2018-02-02 DIAGNOSIS — M47816 Spondylosis without myelopathy or radiculopathy, lumbar region: Secondary | ICD-10-CM | POA: Diagnosis not present

## 2018-02-02 DIAGNOSIS — M129 Arthropathy, unspecified: Secondary | ICD-10-CM | POA: Insufficient documentation

## 2018-02-02 DIAGNOSIS — I69398 Other sequelae of cerebral infarction: Secondary | ICD-10-CM | POA: Insufficient documentation

## 2018-02-02 DIAGNOSIS — M25561 Pain in right knee: Secondary | ICD-10-CM | POA: Insufficient documentation

## 2018-02-02 DIAGNOSIS — H53462 Homonymous bilateral field defects, left side: Secondary | ICD-10-CM | POA: Diagnosis not present

## 2018-02-02 MED ORDER — HYDROCODONE-ACETAMINOPHEN 7.5-325 MG PO TABS: 1.0000 | ORAL_TABLET | Freq: Three times a day (TID) | ORAL | 0 refills | Status: DC | PRN

## 2018-02-02 MED ORDER — CYCLOBENZAPRINE HCL 5 MG PO TABS
5.0000 mg | ORAL_TABLET | Freq: Three times a day (TID) | ORAL | 0 refills | Status: DC | PRN
Start: 2018-02-02 — End: 2018-09-01

## 2018-02-02 NOTE — Progress Notes (Signed)
Subjective:    Patient ID: Brittany Crawford, female    DOB: 11-30-1955, 62 y.o.   MRN: [id number]  HPI   [name] is here in follow up of her chronic pain. She states over the last month she's had pain in the back of her neck which radiates to her head. It seems to be associated with sleeping on her stomach at night. It tends to come on the longer she is in that position.  Other than that she has been managing with her low back and knees.  She admits to not exercising as much as she needs to be.  For pain she is taking hydrocodone 2-3 times a day.  She also uses diclofenac as needed for breakthrough pain.  She does apply heat and ice at times.  Pain Inventory Average Pain 7 Pain Right Now 5 My pain is dull, tingling and aching  In the last 24 hours, has pain interfered with the following? General activity 7 Relation with others 8 Enjoyment of life 10 What TIME of day is your pain at its worst? evening Sleep (in general) n/a  Pain is worse with: n/a Pain improves with: n/a Relief from Meds: 9  Mobility use a cane ability to climb steps?  yes use a wheelchair transfers alone  Function disabled: date disabled n/a Do you have any goals in this area?  yes  Neuro/Psych No problems in this area  Prior Studies Any changes since last visit?  no  Physicians involved in your care Any changes since last visit?  no   Family History  Problem Relation Age of Onset  . Colon cancer Maternal Uncle   . Stroke Mother    Social History   Socioeconomic History  . Marital status: Married    Spouse name: Not on file  . Number of children: 2  . Years of education: 70  . Highest education level: Not on file  Occupational History  . Not on file  Social Needs  . Financial resource strain: Not on file  . Food insecurity:    Worry: Not on file    Inability: Not on file  . Transportation needs:    Medical: Not on file    Non-medical: Not on file  Tobacco Use  .  Smoking status: Former Smoker    Packs/day: 0.00    Years: 0.00    Pack years: 0.00    Types: Cigarettes    Last attempt to quit: 03/13/2017    Years since quitting: 0.8  . Smokeless tobacco: Never Used  . Tobacco comment: 4  to 5  cigarettes daily for years  Substance and Sexual Activity  . Alcohol use: Yes    Alcohol/week: 0.6 oz    Types: 1 Glasses of wine per week    Comment: occasionaly   . Drug use: No  . Sexual activity: Yes  Lifestyle  . Physical activity:    Days per week: Not on file    Minutes per session: Not on file  . Stress: Not on file  Relationships  . Social connections:    Talks on phone: Not on file    Gets together: Not on file    Attends religious service: Not on file    Active member of club or organization: Not on file    Attends meetings of clubs or organizations: Not on file    Relationship status: Not on file  Other Topics Concern  . Not on file  Social History  Narrative   Patient is married with 2 children.   Patient is right handed.   Patient has hs education.   Patient drinks 4 cups daily.   Past Surgical History:  Procedure Laterality Date  . BREAST BIOPSY Left 04/29/2016  . BREAST BIOPSY Left 04/23/2016  . DILATATION & CURRETTAGE/HYSTEROSCOPY WITH RESECTOCOPE N/A 12/10/2012   Procedure: DILATATION & CURETTAGE/HYSTEROSCOPY WITH RESECTOCOPE;  Surgeon: Marvene Staff, MD;  Location: [facility];  Service: Gynecology;  Laterality: N/A;  . FOOT SURGERY     left-pins placed  . LYMPH NODE BIOPSY    . POLYPECTOMY N/A 12/10/2012   Procedure: POLYPECTOMY;  Surgeon: Marvene Staff, MD;  Location: [facility];  Service: Gynecology;  Laterality: N/A;  . STRABISMUS SURGERY Left 04/17/2017   Procedure: REPAIR STRABISMUS LEFT EYE;  Surgeon: Everitt Amber, MD;  Location: [facility];  Service: Ophthalmology;  Laterality: Left;   Past Medical History:  Diagnosis Date  . Chronic back pain   . H/O sarcoidosis   . Hypertension   . Seasonal  allergies   . Stroke ([place])    BP 105/60   Pulse 82   Ht 5' 7.5" (1.715 m)   Wt 204 lb 9.6 oz (92.8 kg)   LMP 11/13/2012   SpO2 93%   BMI 31.57 kg/m   Opioid Risk Score:   Fall Risk Score:  `1  Depression screen PHQ 2/9  Depression screen [facility] 2/9 02/02/2018 11/30/2017 09/28/2017 02/11/2017 12/12/2016 04/28/2016 11/22/2015  Decreased Interest 0 0 0 0 0 0 0  Down, Depressed, Hopeless 0 0 0 0 0 0 0  PHQ - 2 Score 0 0 0 0 0 0 0  Altered sleeping 0 - - - - - -  Tired, decreased energy 0 - - - - - -  Change in appetite 0 - - - - - -  Feeling bad or failure about yourself  0 - - - - - -  Trouble concentrating 0 - - - - - -  Moving slowly or fidgety/restless 0 - - - - - -  Suicidal thoughts 0 - - - - - -  PHQ-9 Score 0 - - - - - -    Review of Systems  Constitutional: Negative.   HENT: Negative.   Eyes: Negative.   Respiratory: Negative.   Cardiovascular: Negative.   Gastrointestinal: Negative.   Endocrine: Negative.   Genitourinary: Negative.   Musculoskeletal: Negative.   Skin: Negative.   Allergic/Immunologic: Negative.   Neurological: Negative.   Hematological: Negative.   Psychiatric/Behavioral: Negative.   All other systems reviewed and are negative.      Objective:   Physical Exam  General: No acute distress HEENT: EOMI, oral membranes moist Cards: reg rate  Chest: normal effort Abdomen: Soft, NT, ND Skin: dry, intact Extremities: no edema Skin:Clean and intact without signs of breakdown  Neuro:balance in tact.  Cognitively intactNo obvious facial weakness. Sensory exam is normal on right and 1+ on left face, arm, leg--stable.  Reflexes are 1+ in all 4's. Fine motor coordination is intact. No tremors. Motor function is grossly 5/5in all 4s--stable Musculoskeletal:cervical facets tender as are lumbar facets with testing. Posture head forward. Low back TTP, muscles somewat taut Psych:Pt's affect is pleasant  Assessment & Plan:  1. Lumbar spondylosis with  DDD and facet arthropathy. Sx appear most prominent at L5-S1 on exam  2. Right knee pain---appears minimal on exam today  3. Right thalamic/internal capsule lacunar infarct with persistent left hemisensory deficits and  pain syndrome.  4. ?old BI as youth   5. Hx of marijuana use.    Plan:  1. Continue diclofenac for breakthrough pain.  2. Consider lumbar medial branch blocks bilaterally at L4-5 and L5-S1 potentially for low back. 3. Hydrocodone 5/325 one q8 prn for breakthrough pain---continue at#75. We will continue the controlled substance monitoring program, this consists of regular clinic visits, examinations, routine drug screening, pill counts as well as use of [place] Controlled Substance Reporting System. [place] was [place] today.   -second rf for next month 4. Continue with HEP. Provided cervical exercises today.  5. trial of flexeril 5mg  q8 prn for spasms, use at night mostly  6. Follow up with  NP in 64months.15 minutes of face to face patient care time were spent during this visit. All questions were encouraged and answered.

## 2018-02-02 NOTE — Patient Instructions (Signed)
Cervical Strain and Sprain Rehab Ask your health care provider which exercises are safe for you. Do exercises exactly as told by your health care provider and adjust them as directed. It is normal to feel mild stretching, pulling, tightness, or discomfort as you do these exercises, but you should stop right away if you feel sudden pain or your pain gets worse.Do not begin these exercises until told by your health care provider. Stretching and range of motion exercises These exercises warm up your muscles and joints and improve the movement and flexibility of your neck. These exercises also help to relieve pain, numbness, and tingling. Exercise A: Cervical side bend  1. Using good posture, sit on a stable chair or stand up. 2. Without moving your shoulders, slowly tilt your left / right ear to your shoulder until you feel a stretch in your neck muscles. You should be looking straight ahead. 3. Hold for __________ seconds. 4. Repeat with the other side of your neck. Repeat __________ times. Complete this exercise __________ times a day. Exercise B: Cervical rotation  1. Using good posture, sit on a stable chair or stand up. 2. Slowly turn your head to the side as if you are looking over your left / right shoulder. ? Keep your eyes level with the ground. ? Stop when you feel a stretch along the side and the back of your neck. 3. Hold for __________ seconds. 4. Repeat this by turning to your other side. Repeat __________ times. Complete this exercise __________ times a day. Exercise C: Thoracic extension and pectoral stretch 1. Roll a towel or a small blanket so it is about 4 inches (10 cm) in diameter. 2. Lie down on your back on a firm surface. 3. Put the towel lengthwise, under your spine in the middle of your back. It should not be not under your shoulder blades. The towel should line up with your spine from your middle back to your lower back. 4. Put your hands behind your head and let your  elbows fall out to your sides. 5. Hold for __________ seconds. Repeat __________ times. Complete this exercise __________ times a day. Strengthening exercises These exercises build strength and endurance in your neck. Endurance is the ability to use your muscles for a long time, even after your muscles get tired. Exercise D: Upper cervical flexion, isometric 1. Lie on your back with a thin pillow behind your head and a small rolled-up towel under your neck. 2. Gently tuck your chin toward your chest and nod your head down to look toward your feet. Do not lift your head off the pillow. 3. Hold for __________ seconds. 4. Release the tension slowly. Relax your neck muscles completely before you repeat this exercise. Repeat __________ times. Complete this exercise __________ times a day. Exercise E: Cervical extension, isometric  1. Stand about 6 inches (15 cm) away from a wall, with your back facing the wall. 2. Place a soft object, about 6-8 inches (15-20 cm) in diameter, between the back of your head and the wall. A soft object could be a small pillow, a ball, or a folded towel. 3. Gently tilt your head back and press into the soft object. Keep your jaw and forehead relaxed. 4. Hold for __________ seconds. 5. Release the tension slowly. Relax your neck muscles completely before you repeat this exercise. Repeat __________ times. Complete this exercise __________ times a day. Posture and body mechanics  Body mechanics refers to the movements and positions of   your body while you do your daily activities. Posture is part of body mechanics. Good posture and healthy body mechanics can help to relieve stress in your body's tissues and joints. Good posture means that your spine is in its natural S-curve position (your spine is neutral), your shoulders are pulled back slightly, and your head is not tipped forward. The following are general guidelines for applying improved posture and body mechanics to  your everyday activities. Standing  When standing, keep your spine neutral and keep your feet about hip-width apart. Keep a slight bend in your knees. Your ears, shoulders, and hips should line up.  When you do a task in which you stand in one place for a long time, place one foot up on a stable object that is 2-4 inches (5-10 cm) high, such as a footstool. This helps keep your spine neutral. Sitting   When sitting, keep your spine neutral and your keep feet flat on the floor. Use a footrest, if necessary, and keep your thighs parallel to the floor. Avoid rounding your shoulders, and avoid tilting your head forward.  When working at a desk or a computer, keep your desk at a height where your hands are slightly lower than your elbows. Slide your chair under your desk so you are close enough to maintain good posture.  When working at a computer, place your monitor at a height where you are looking straight ahead and you do not have to tilt your head forward or downward to look at the screen. Resting When lying down and resting, avoid positions that are most painful for you. Try to support your neck in a neutral position. You can use a contour pillow or a small rolled-up towel. Your pillow should support your neck but not push on it. This information is not intended to replace advice given to you by your health care provider. Make sure you discuss any questions you have with your health care provider. Document Released: 09/29/2005 Document Revised: 06/05/2016 Document Reviewed: 09/05/2015 Elsevier Interactive Patient Education  2018 [employer]vier Inc[employer]  

## 2018-03-03 ENCOUNTER — Other Ambulatory Visit: Payer: Self-pay | Admitting: Family

## 2018-03-03 DIAGNOSIS — Z1231 Encounter for screening mammogram for malignant neoplasm of breast: Secondary | ICD-10-CM

## 2018-03-29 ENCOUNTER — Ambulatory Visit
Admission: RE | Admit: 2018-03-29 | Discharge: 2018-03-29 | Disposition: A | Discharge status: Home / Self Care | Payer: Medicare Other | Source: Ambulatory Visit | Attending: Family | Admitting: Family

## 2018-03-29 DIAGNOSIS — Z1231 Encounter for screening mammogram for malignant neoplasm of breast: Secondary | ICD-10-CM

## 2018-04-05 ENCOUNTER — Encounter: Payer: Medicare Other | Attending: Physical Medicine & Rehabilitation | Admitting: Registered Nurse

## 2018-04-05 ENCOUNTER — Encounter: Payer: Self-pay | Admitting: Registered Nurse

## 2018-04-05 VITALS — BP 101/71 | HR 76 | Resp 14 | Ht 68.0 in | Wt 204.0 lb

## 2018-04-05 DIAGNOSIS — Z79899 Other long term (current) drug therapy: Secondary | ICD-10-CM

## 2018-04-05 DIAGNOSIS — M479 Spondylosis, unspecified: Secondary | ICD-10-CM | POA: Diagnosis not present

## 2018-04-05 DIAGNOSIS — Z5181 Encounter for therapeutic drug level monitoring: Secondary | ICD-10-CM | POA: Diagnosis not present

## 2018-04-05 DIAGNOSIS — M5136 Other intervertebral disc degeneration, lumbar region: Secondary | ICD-10-CM | POA: Diagnosis not present

## 2018-04-05 DIAGNOSIS — M47816 Spondylosis without myelopathy or radiculopathy, lumbar region: Secondary | ICD-10-CM

## 2018-04-05 DIAGNOSIS — G894 Chronic pain syndrome: Secondary | ICD-10-CM | POA: Diagnosis not present

## 2018-04-05 DIAGNOSIS — M545 Low back pain: Secondary | ICD-10-CM | POA: Insufficient documentation

## 2018-04-05 DIAGNOSIS — I69398 Other sequelae of cerebral infarction: Secondary | ICD-10-CM | POA: Insufficient documentation

## 2018-04-05 DIAGNOSIS — H53462 Homonymous bilateral field defects, left side: Secondary | ICD-10-CM | POA: Insufficient documentation

## 2018-04-05 DIAGNOSIS — I6381 Other cerebral infarction due to occlusion or stenosis of small artery: Secondary | ICD-10-CM | POA: Diagnosis not present

## 2018-04-05 DIAGNOSIS — M25561 Pain in right knee: Secondary | ICD-10-CM | POA: Diagnosis not present

## 2018-04-05 DIAGNOSIS — M129 Arthropathy, unspecified: Secondary | ICD-10-CM | POA: Insufficient documentation

## 2018-04-05 MED ORDER — HYDROCODONE-ACETAMINOPHEN 7.5-325 MG PO TABS: 1.0000 | ORAL_TABLET | Freq: Three times a day (TID) | ORAL | 0 refills | Status: DC | PRN

## 2018-04-05 MED ORDER — DICLOFENAC SODIUM 1 % TD GEL: 2.0000 g | Freq: Four times a day (QID) | TRANSDERMAL | 3 refills | Status: DC

## 2018-04-05 NOTE — Progress Notes (Signed)
Subjective:    Patient ID: Brittany Crawford, female    DOB: 07/07/1956, 62 y.o.   MRN: [id number]  HPI: [name] is a 62 year old female who returns for follow up appointment for chronic pain and medication refill. She states her pain is in her lower back and sacral pain. She rates her pain 5. Her current exercise regime is walking, riding the stationary bicycle for 3-5 minutes 2-3 times a week and using [employer] Track daily.   Brittany Crawford Morphine Equivalent is 22.50 MME. Last UDS was performed on 11/30/2017, it was consistent.    Pain Inventory Average Pain 5 Pain Right Now 5 My pain is na  In the last 24 hours, has pain interfered with the following? General activity na Relation with others na Enjoyment of life na What TIME of day is your pain at its worst? morning, evening Sleep (in general) Fair  Pain is worse with: bending and standing Pain improves with: rest, heat/ice, therapy/exercise and medication Relief from Meds: 8  Mobility walk with assistance use a cane ability to climb steps?  no do you drive?  no Do you have any goals in this area?  yes  Function disabled: date disabled . Do you have any goals in this area?  yes  Neuro/Psych No problems in this area  Prior Studies Any changes since last visit?  no  Physicians involved in your care Any changes since last visit?  no   Family History  Problem Relation Age of Onset  . Colon cancer Maternal Uncle   . Stroke Mother    Social History   Socioeconomic History  . Marital status: Married    Spouse name: Not on file  . Number of children: 2  . Years of education: 47  . Highest education level: Not on file  Occupational History  . Not on file  Social Needs  . Financial resource strain: Not on file  . Food insecurity:    Worry: Not on file    Inability: Not on file  . Transportation needs:    Medical: Not on file    Non-medical: Not on file  Tobacco Use  . Smoking status: Former  Smoker    Packs/day: 0.00    Years: 0.00    Pack years: 0.00    Types: Cigarettes    Last attempt to quit: 03/13/2017    Years since quitting: 1.0  . Smokeless tobacco: Never Used  . Tobacco comment: 4  to 5  cigarettes daily for years  Substance and Sexual Activity  . Alcohol use: Yes    Alcohol/week: 0.6 oz    Types: 1 Glasses of wine per week    Comment: occasionaly   . Drug use: No  . Sexual activity: Yes  Lifestyle  . Physical activity:    Days per week: Not on file    Minutes per session: Not on file  . Stress: Not on file  Relationships  . Social connections:    Talks on phone: Not on file    Gets together: Not on file    Attends religious service: Not on file    Active member of club or organization: Not on file    Attends meetings of clubs or organizations: Not on file    Relationship status: Not on file  Other Topics Concern  . Not on file  Social History Narrative   Patient is married with 2 children.   Patient is right handed.  Patient has hs education.   Patient drinks 4 cups daily.   Past Surgical History:  Procedure Laterality Date  . BREAST BIOPSY Left 04/29/2016  . BREAST BIOPSY Left 04/23/2016  . DILATATION & CURRETTAGE/HYSTEROSCOPY WITH RESECTOCOPE N/A 12/10/2012   Procedure: DILATATION & CURETTAGE/HYSTEROSCOPY WITH RESECTOCOPE;  Surgeon: Marvene Staff, MD;  Location: [facility];  Service: Gynecology;  Laterality: N/A;  . FOOT SURGERY     left-pins placed  . LYMPH NODE BIOPSY    . POLYPECTOMY N/A 12/10/2012   Procedure: POLYPECTOMY;  Surgeon: Marvene Staff, MD;  Location: [facility];  Service: Gynecology;  Laterality: N/A;  . STRABISMUS SURGERY Left 04/17/2017   Procedure: REPAIR STRABISMUS LEFT EYE;  Surgeon: Everitt Amber, MD;  Location: [facility];  Service: Ophthalmology;  Laterality: Left;   Past Medical History:  Diagnosis Date  . Chronic back pain   . H/O sarcoidosis   . Hypertension   . Seasonal allergies   . Stroke  ([place])    BP 101/71 (BP Location: Right Arm, Patient Position: Sitting, Cuff Size: Large)   Pulse 76   Resp 14   Ht 5\' 8"  (1.727 m)   Wt 204 lb (92.5 kg)   LMP 11/13/2012   SpO2 95%   BMI 31.02 kg/m   Opioid Risk Score:   Fall Risk Score:  `1  Depression screen PHQ 2/9  Depression screen [facility] 2/9 02/02/2018 11/30/2017 09/28/2017 02/11/2017 12/12/2016 04/28/2016 11/22/2015  Decreased Interest 0 0 0 0 0 0 0  Down, Depressed, Hopeless 0 0 0 0 0 0 0  PHQ - 2 Score 0 0 0 0 0 0 0  Altered sleeping 0 - - - - - -  Tired, decreased energy 0 - - - - - -  Change in appetite 0 - - - - - -  Feeling bad or failure about yourself  0 - - - - - -  Trouble concentrating 0 - - - - - -  Moving slowly or fidgety/restless 0 - - - - - -  Suicidal thoughts 0 - - - - - -  PHQ-9 Score 0 - - - - - -    Review of Systems  Constitutional: Negative.   HENT: Negative.   Eyes: Negative.   Respiratory: Negative.   Cardiovascular: Negative.   Gastrointestinal: Negative.   Endocrine: Negative.   Genitourinary: Negative.   Musculoskeletal: Positive for back pain.  Skin: Negative.   Allergic/Immunologic: Negative.   Neurological: Negative.   Hematological: Negative.   Psychiatric/Behavioral: Negative.   All other systems reviewed and are negative.      Objective:   Physical Exam  Constitutional: She is oriented to person, place, and time. She appears well-developed and well-nourished.  HENT:  Head: Normocephalic and atraumatic.  Neck: Normal range of motion. Neck supple.  Cardiovascular: Normal rate and regular rhythm.  Pulmonary/Chest: Effort normal and breath sounds normal.  Musculoskeletal:  Normal Muscle Bulk and Muscle Testing Reveals: Upper Extremities: Full ROM and Muscle Strength 5/5 Lumbar Paraspinal Tenderness: L-4-L-5 Lower Extremities: Full ROM and Muscle Strength 5/5 Arises from Table with Ease Narrow Based Gait  Neurological: She is alert and oriented to person, place, and time.    Skin: Skin is warm and dry.  Nursing note and vitals reviewed.         Assessment & Plan:  1. Lumbar spondylosis with DDD and facet arthropathy. 04/05/2018 Refilled:Hydrocodone 7.5 /325 mg one tablet every 8 hours as needed for pain #75. Second script  e-scribe for the following month. We will continue the opioid monitoring program, this consists of regular clinic visits, examinations, urine drug screen, pill counts as well as use of [place] Controlled Substance Reporting System. 2. Bilateral knee pain: No complaints voiced today:Continue with heat/ice, exercise and Diclofenac. 04/05/2018 3. Right thalamic/internal capsule lacunar infarct with persistent left hemisensory deficits: Continue to Monitor. 04/05/2018 4. Bilateral Greater Trochanteric Bursitis: No complaints Today. Continue current medication regime. 04/05/2018  20 minutes of face to face patient care time was spent during this visit. All questions were encouraged and answered.   F/U in 1 month

## 2018-04-06 DIAGNOSIS — Z79899 Other long term (current) drug therapy: Secondary | ICD-10-CM | POA: Diagnosis not present

## 2018-04-06 DIAGNOSIS — E559 Vitamin D deficiency, unspecified: Secondary | ICD-10-CM | POA: Diagnosis not present

## 2018-04-06 DIAGNOSIS — E669 Obesity, unspecified: Secondary | ICD-10-CM | POA: Diagnosis not present

## 2018-04-06 DIAGNOSIS — I10 Essential (primary) hypertension: Secondary | ICD-10-CM | POA: Diagnosis not present

## 2018-04-06 DIAGNOSIS — R635 Abnormal weight gain: Secondary | ICD-10-CM | POA: Diagnosis not present

## 2018-04-06 DIAGNOSIS — R7309 Other abnormal glucose: Secondary | ICD-10-CM | POA: Diagnosis not present

## 2018-04-06 DIAGNOSIS — Z13220 Encounter for screening for lipoid disorders: Secondary | ICD-10-CM | POA: Diagnosis not present

## 2018-05-17 ENCOUNTER — Ambulatory Visit (INDEPENDENT_AMBULATORY_CARE_PROVIDER_SITE_OTHER): Payer: Medicare Other | Admitting: Podiatry

## 2018-05-17 ENCOUNTER — Encounter: Payer: Self-pay | Admitting: Podiatry

## 2018-05-17 DIAGNOSIS — L600 Ingrowing nail: Secondary | ICD-10-CM

## 2018-05-17 NOTE — Patient Instructions (Signed)

## 2018-05-19 NOTE — Progress Notes (Signed)
   Subjective: Patient presents today for evaluation of intermittent pain to the lateral border of the right hallux that began 6-8 months ago. Patient is concerned for possible ingrown nail. Applying pressure and wearing shoes increases the pain. She has not done anything for treatment. Patient presents today for further treatment and evaluation.  Past Medical History:  Diagnosis Date  . Chronic back pain   . H/O sarcoidosis   . Hypertension   . Seasonal allergies   . Stroke [facility])     Objective:  General: Well developed, nourished, in no acute distress, alert and oriented x3   Dermatology: Skin is warm, dry and supple bilateral. Lateral border of the right hallux appears to be erythematous with evidence of an ingrowing nail. Pain on palpation noted to the border of the nail fold. The remaining nails appear unremarkable at this time. There are no open sores, lesions.  Vascular: Dorsalis Pedis artery and Posterior Tibial artery pedal pulses palpable. No lower extremity edema noted.   Neruologic: Grossly intact via light touch bilateral.  Musculoskeletal: Muscular strength within normal limits in all groups bilateral. Normal range of motion noted to all pedal and ankle joints.   Assesement: #1 Paronychia with ingrowing nail lateral border right hallux  #2 Pain in toe #3 Incurvated nail  Plan of Care:  1. Patient evaluated.  2. Discussed treatment alternatives and plan of care. Explained nail avulsion procedure and post procedure course to patient. 3. Patient opted for permanent partial nail avulsion.  4. Prior to procedure, local anesthesia infiltration utilized using 3 ml of a 50:50 mixture of 2% plain lidocaine and 0.5% plain marcaine in a normal hallux block fashion and a betadine prep performed.  5. Partial permanent nail avulsion with chemical matrixectomy performed using [id number] applications of phenol followed by alcohol flush.  6. Light dressing applied. 7. Return to clinic in  2 weeks.   Edrick Kins, DPM [facility]  Dr. Edrick Kins, [address]                                        [place], [place] [address]                Office [phone]  Fax [phone]

## 2018-05-24 DIAGNOSIS — H50132 Monocular exotropia with V pattern, left eye: Secondary | ICD-10-CM | POA: Diagnosis not present

## 2018-05-26 DIAGNOSIS — E785 Hyperlipidemia, unspecified: Secondary | ICD-10-CM | POA: Diagnosis not present

## 2018-05-26 DIAGNOSIS — M549 Dorsalgia, unspecified: Secondary | ICD-10-CM | POA: Diagnosis not present

## 2018-05-26 DIAGNOSIS — I10 Essential (primary) hypertension: Secondary | ICD-10-CM | POA: Diagnosis not present

## 2018-05-26 DIAGNOSIS — H35062 Retinal vasculitis, left eye: Secondary | ICD-10-CM | POA: Diagnosis not present

## 2018-05-26 DIAGNOSIS — D860 Sarcoidosis of lung: Secondary | ICD-10-CM | POA: Diagnosis not present

## 2018-05-31 ENCOUNTER — Ambulatory Visit (INDEPENDENT_AMBULATORY_CARE_PROVIDER_SITE_OTHER): Payer: Medicare Other | Admitting: Podiatry

## 2018-05-31 DIAGNOSIS — L600 Ingrowing nail: Secondary | ICD-10-CM | POA: Diagnosis not present

## 2018-06-02 NOTE — Progress Notes (Signed)
   Subjective: Patient presents today 2 weeks post ingrown nail permanent nail avulsion procedure of the lateral border of the right hallux. Patient states that the toe and nail fold is feeling much better. Patient is here for further evaluation and treatment.   Past Medical History:  Diagnosis Date  . Chronic back pain   . H/O sarcoidosis   . Hypertension   . Seasonal allergies   . Stroke [facility])     Objective: Skin is warm, dry and supple. Nail and respective nail fold appears to be healing appropriately. Open wound to the associated nail fold with a granular wound base and moderate amount of fibrotic tissue. Minimal drainage noted. Mild erythema around the periungual region likely due to phenol chemical matricectomy.  Assessment: #1 postop permanent partial nail avulsion lateral border right hallux  #2 open wound periungual nail fold of respective digit.   Plan of care: #1 patient was evaluated  #2 debridement of open wound was performed to the periungual border of the respective toe using a currette. Antibiotic ointment and Band-Aid was applied. #3 patient is to return to clinic on a PRN basis.   Edrick Kins, DPM [facility]  Dr. Edrick Kins, [address]                                        [place], [place] [address]                Office [phone]  Fax [phone]

## 2018-06-08 ENCOUNTER — Encounter: Payer: Medicare Other | Attending: Physical Medicine & Rehabilitation | Admitting: Registered Nurse

## 2018-06-08 ENCOUNTER — Encounter: Payer: Self-pay | Admitting: Registered Nurse

## 2018-06-08 VITALS — BP 119/79 | HR 75 | Resp 14 | Ht 68.0 in | Wt 200.0 lb

## 2018-06-08 DIAGNOSIS — M7062 Trochanteric bursitis, left hip: Secondary | ICD-10-CM

## 2018-06-08 DIAGNOSIS — H53462 Homonymous bilateral field defects, left side: Secondary | ICD-10-CM | POA: Diagnosis not present

## 2018-06-08 DIAGNOSIS — M129 Arthropathy, unspecified: Secondary | ICD-10-CM | POA: Insufficient documentation

## 2018-06-08 DIAGNOSIS — M479 Spondylosis, unspecified: Secondary | ICD-10-CM | POA: Diagnosis not present

## 2018-06-08 DIAGNOSIS — M5136 Other intervertebral disc degeneration, lumbar region: Secondary | ICD-10-CM | POA: Diagnosis not present

## 2018-06-08 DIAGNOSIS — M7061 Trochanteric bursitis, right hip: Secondary | ICD-10-CM

## 2018-06-08 DIAGNOSIS — I6381 Other cerebral infarction due to occlusion or stenosis of small artery: Secondary | ICD-10-CM | POA: Diagnosis not present

## 2018-06-08 DIAGNOSIS — M25561 Pain in right knee: Secondary | ICD-10-CM | POA: Diagnosis not present

## 2018-06-08 DIAGNOSIS — Z5181 Encounter for therapeutic drug level monitoring: Secondary | ICD-10-CM

## 2018-06-08 DIAGNOSIS — G894 Chronic pain syndrome: Secondary | ICD-10-CM | POA: Diagnosis not present

## 2018-06-08 DIAGNOSIS — M47816 Spondylosis without myelopathy or radiculopathy, lumbar region: Secondary | ICD-10-CM | POA: Diagnosis not present

## 2018-06-08 DIAGNOSIS — I69398 Other sequelae of cerebral infarction: Secondary | ICD-10-CM | POA: Insufficient documentation

## 2018-06-08 DIAGNOSIS — Z79891 Long term (current) use of opiate analgesic: Secondary | ICD-10-CM

## 2018-06-08 DIAGNOSIS — M62830 Muscle spasm of back: Secondary | ICD-10-CM

## 2018-06-08 DIAGNOSIS — M545 Low back pain: Secondary | ICD-10-CM | POA: Diagnosis not present

## 2018-06-08 MED ORDER — HYDROCODONE-ACETAMINOPHEN 7.5-325 MG PO TABS: 1.0000 | ORAL_TABLET | Freq: Three times a day (TID) | ORAL | 0 refills | Status: DC | PRN

## 2018-06-08 MED ORDER — METHOCARBAMOL 500 MG PO TABS: 500.0000 mg | ORAL_TABLET | Freq: Two times a day (BID) | ORAL | 2 refills | Status: DC | PRN

## 2018-06-08 NOTE — Progress Notes (Signed)
Subjective:    Patient ID: Brittany Crawford, female    DOB: 02-Aug-1956, 62 y.o.   MRN: [id number]  HPI: [name] is a 62 year old female who returns for follow up appointment for chronic pain and medication refill. She states her pain is located in her mid-lower back. Also reports her pain has increased in intensity over the last month and her hydrocodone last about 6 hours. We will increase her tablets this month and re-evaluate on her next visit, she verbalizes understanding. Also instructed to call office with any questions or concerns, she verbalizes understanding. Also having muscle spasms in her back she's prescribe flexeril due to day time drowsiness she's only able to take at bedtime. We will prescribe Robaxin  Twice a day morning and afternoon dose. Unable to prescribe Tizanidine due to day time drowsiness as well. She rates her pain 3. Her current exercise regime is walking QOD.   Brittany Crawford Morphine Equivalent is 22.50 MME. Last UDS was Performed on 11/30/2017,it was consistent.    Pain Inventory Average Pain 8 Pain Right Now 3 My pain is sharp, dull and aching  In the last 24 hours, has pain interfered with the following? General activity 8 Relation with others 10 Enjoyment of life 10 What TIME of day is your pain at its worst? morning, evening  Sleep (in general) NA  Pain is worse with: bending and standing Pain improves with: rest, heat/ice, therapy/exercise and medication Relief from Meds: 10  Mobility walk with assistance use a cane ability to climb steps?  yes do you drive?  no Do you have any goals in this area?  yes  Function disabled: date disabled . Do you have any goals in this area?  yes  Neuro/Psych No problems in this area  Prior Studies Any changes since last visit?  no  Physicians involved in your care Any changes since last visit?  no   Family History  Problem Relation Age of Onset  . Colon cancer Maternal Uncle   .  Stroke Mother    Social History   Socioeconomic History  . Marital status: Married    Spouse name: Not on file  . Number of children: 2  . Years of education: 74  . Highest education level: Not on file  Occupational History  . Not on file  Social Needs  . Financial resource strain: Not on file  . Food insecurity:    Worry: Not on file    Inability: Not on file  . Transportation needs:    Medical: Not on file    Non-medical: Not on file  Tobacco Use  . Smoking status: Former Smoker    Packs/day: 0.00    Years: 0.00    Pack years: 0.00    Types: Cigarettes    Last attempt to quit: 03/13/2017    Years since quitting: 1.2  . Smokeless tobacco: Never Used  . Tobacco comment: 4  to 5  cigarettes daily for years  Substance and Sexual Activity  . Alcohol use: Yes    Alcohol/week: 1.0 standard drinks    Types: 1 Glasses of wine per week    Comment: occasionaly   . Drug use: No  . Sexual activity: Yes  Lifestyle  . Physical activity:    Days per week: Not on file    Minutes per session: Not on file  . Stress: Not on file  Relationships  . Social connections:    Talks on phone:  Not on file    Gets together: Not on file    Attends religious service: Not on file    Active member of club or organization: Not on file    Attends meetings of clubs or organizations: Not on file    Relationship status: Not on file  Other Topics Concern  . Not on file  Social History Narrative   Patient is married with 2 children.   Patient is right handed.   Patient has hs education.   Patient drinks 4 cups daily.   Past Surgical History:  Procedure Laterality Date  . BREAST BIOPSY Left 04/29/2016  . BREAST BIOPSY Left 04/23/2016  . DILATATION & CURRETTAGE/HYSTEROSCOPY WITH RESECTOCOPE N/A 12/10/2012   Procedure: DILATATION & CURETTAGE/HYSTEROSCOPY WITH RESECTOCOPE;  Surgeon: Marvene Staff, MD;  Location: [facility];  Service: Gynecology;  Laterality: N/A;  . FOOT SURGERY     left-pins  placed  . LYMPH NODE BIOPSY    . POLYPECTOMY N/A 12/10/2012   Procedure: POLYPECTOMY;  Surgeon: Marvene Staff, MD;  Location: [facility];  Service: Gynecology;  Laterality: N/A;  . STRABISMUS SURGERY Left 04/17/2017   Procedure: REPAIR STRABISMUS LEFT EYE;  Surgeon: Everitt Amber, MD;  Location: [facility];  Service: Ophthalmology;  Laterality: Left;   Past Medical History:  Diagnosis Date  . Chronic back pain   . H/O sarcoidosis   . Hypertension   . Seasonal allergies   . Stroke ([place])    BP 119/79 (BP Location: Right Arm, Patient Position: Sitting, Cuff Size: Normal)   Pulse 75   Resp 14   Ht 5\' 8"  (1.727 m)   Wt 200 lb (90.7 kg)   LMP 11/13/2012   SpO2 95%   BMI 30.41 kg/m   Opioid Risk Score:   Fall Risk Score:  `1  Depression screen PHQ 2/9  Depression screen [facility] 2/9 02/02/2018 11/30/2017 09/28/2017 02/11/2017 12/12/2016 04/28/2016 11/22/2015  Decreased Interest 0 0 0 0 0 0 0  Down, Depressed, Hopeless 0 0 0 0 0 0 0  PHQ - 2 Score 0 0 0 0 0 0 0  Altered sleeping 0 - - - - - -  Tired, decreased energy 0 - - - - - -  Change in appetite 0 - - - - - -  Feeling bad or failure about yourself  0 - - - - - -  Trouble concentrating 0 - - - - - -  Moving slowly or fidgety/restless 0 - - - - - -  Suicidal thoughts 0 - - - - - -  PHQ-9 Score 0 - - - - - -    Review of Systems  Constitutional: Negative.   HENT: Negative.   Eyes: Negative.   Respiratory: Negative.   Cardiovascular: Negative.   Gastrointestinal: Negative.   Endocrine: Negative.   Genitourinary: Negative.   Musculoskeletal: Negative.   Skin: Negative.   Allergic/Immunologic: Negative.   Neurological: Negative.   Hematological: Negative.   Psychiatric/Behavioral: Negative.   All other systems reviewed and are negative.      Objective:   Physical Exam  Constitutional: She is oriented to person, place, and time. She appears well-developed and well-nourished.  HENT:  Head: Normocephalic and  atraumatic.  Neck: Normal range of motion. Neck supple.  Cardiovascular: Normal rate and regular rhythm.  Pulmonary/Chest: Effort normal and breath sounds normal.  Musculoskeletal:  Normal Muscle Bulk and Muscle Testing Reveals: Upper Extremities: Full ROM and Muscle Strength 5/5 Thoracic Paraspinal Tenderness: T-7-T-9  Lumbar Paraspinal Tenderness: L-3-L-5 Lower Extremities: Full ROM and Muscle Strength 5/5 Arises from Table with ease Narrow Based gait  Neurological: She is alert and oriented to person, place, and time.  Skin: Skin is warm and dry.  Psychiatric: She has a normal mood and affect. Her behavior is normal.  Nursing note and vitals reviewed.         Assessment & Plan:  1. Lumbar spondylosis with DDD and facet arthropathy.06/08/2018 Refilled:Increased: Hydrocodone 7.5 /325 mg one tablet every 8 hours as needed for pain #85.Second script e-scribe for the following month. We will continue the opioid monitoring program, this consists of regular clinic visits, examinations, urine drug screen, pill counts as well as use of [place] Controlled Substance Reporting System. 2. Bilateral knee pain: No complaints voiced today:Continue with heat/ice, exercise and Diclofenac.06/08/2018 3. Right thalamic/internal capsule lacunar infarct with persistent left hemisensory deficits: Continue to Monitor.06/08/2018 4. Bilateral Greater Trochanteric Bursitis:No complaints Today.Continue current medication regime.04/05/2018 5. Muscle Spasm: RX: Robaxin in the morning and afternoon due to daytime drowsiness. Continue Flexeril as HS.   20 minutes of face to face patient care time was spent during this visit. All questions were encouraged and answered.   F/U in 1 month

## 2018-06-11 LAB — DRUG TOX MONITOR 1 W/CONF, ORAL FLD
Amphetamines: NEGATIVE ng/mL (ref <10)
BARBITURATES: NEGATIVE ng/mL (ref <10)
Benzodiazepines: NEGATIVE ng/mL (ref <0.50)
Buprenorphine: NEGATIVE ng/mL (ref <0.10)
Cocaine: NEGATIVE ng/mL (ref <5.0)
Codeine: NEGATIVE ng/mL (ref <2.5)
Dihydrocodeine: 2.8 ng/mL — ABNORMAL HIGH (ref <2.5)
FENTANYL: NEGATIVE ng/mL (ref <0.10)
HEROIN METABOLITE: NEGATIVE ng/mL (ref <1.0)
HYDROCODONE: 42 ng/mL — AB (ref <2.5)
Hydromorphone: NEGATIVE ng/mL (ref <2.5)
MARIJUANA: NEGATIVE ng/mL (ref <2.5)
MDMA: NEGATIVE ng/mL (ref <10)
MEPROBAMATE: NEGATIVE ng/mL (ref <2.5)
METHADONE: NEGATIVE ng/mL (ref <5.0)
Morphine: NEGATIVE ng/mL (ref <2.5)
NICOTINE METABOLITE: NEGATIVE ng/mL (ref <5.0)
NOROXYCODONE: NEGATIVE ng/mL (ref <2.5)
Norhydrocodone: NEGATIVE ng/mL (ref <2.5)
OXYMORPHONE: NEGATIVE ng/mL (ref <2.5)
Opiates: POSITIVE ng/mL — AB (ref <2.5)
Oxycodone: NEGATIVE ng/mL (ref <2.5)
PHENCYCLIDINE: NEGATIVE ng/mL (ref <10)
Tapentadol: NEGATIVE ng/mL (ref <5.0)
Tramadol: NEGATIVE ng/mL (ref <5.0)
ZOLPIDEM: NEGATIVE ng/mL (ref <5.0)

## 2018-06-11 LAB — DRUG TOX ALC METAB W/CON, ORAL FLD: Alcohol Metabolite: NEGATIVE ng/mL (ref <25)

## 2018-06-15 ENCOUNTER — Telehealth: Payer: Self-pay | Admitting: *Deleted

## 2018-06-15 NOTE — Telephone Encounter (Signed)
Oral swab drug screen was consistent for prescribed medications.  ?

## 2018-08-04 ENCOUNTER — Encounter: Payer: Medicare Other | Attending: Physical Medicine & Rehabilitation | Admitting: Physical Medicine & Rehabilitation

## 2018-08-04 ENCOUNTER — Other Ambulatory Visit: Payer: Self-pay

## 2018-08-04 ENCOUNTER — Encounter: Payer: Self-pay | Admitting: Physical Medicine & Rehabilitation

## 2018-08-04 VITALS — BP 134/80 | HR 71 | Ht 67.5 in | Wt 203.0 lb

## 2018-08-04 DIAGNOSIS — M129 Arthropathy, unspecified: Secondary | ICD-10-CM | POA: Diagnosis not present

## 2018-08-04 DIAGNOSIS — M25561 Pain in right knee: Secondary | ICD-10-CM | POA: Insufficient documentation

## 2018-08-04 DIAGNOSIS — M47816 Spondylosis without myelopathy or radiculopathy, lumbar region: Secondary | ICD-10-CM

## 2018-08-04 DIAGNOSIS — H53462 Homonymous bilateral field defects, left side: Secondary | ICD-10-CM | POA: Diagnosis not present

## 2018-08-04 DIAGNOSIS — I6381 Other cerebral infarction due to occlusion or stenosis of small artery: Secondary | ICD-10-CM

## 2018-08-04 DIAGNOSIS — M62830 Muscle spasm of back: Secondary | ICD-10-CM

## 2018-08-04 DIAGNOSIS — M5136 Other intervertebral disc degeneration, lumbar region: Secondary | ICD-10-CM | POA: Insufficient documentation

## 2018-08-04 DIAGNOSIS — I69398 Other sequelae of cerebral infarction: Secondary | ICD-10-CM | POA: Insufficient documentation

## 2018-08-04 DIAGNOSIS — M545 Low back pain: Secondary | ICD-10-CM | POA: Diagnosis not present

## 2018-08-04 DIAGNOSIS — M479 Spondylosis, unspecified: Secondary | ICD-10-CM | POA: Insufficient documentation

## 2018-08-04 MED ORDER — HYDROCODONE-ACETAMINOPHEN 7.5-325 MG PO TABS: 1.0000 | ORAL_TABLET | Freq: Three times a day (TID) | ORAL | 0 refills | Status: DC | PRN

## 2018-08-04 NOTE — Patient Instructions (Signed)
PLEASE FEEL FREE TO CALL OUR OFFICE WITH ANY PROBLEMS OR QUESTIONS ([phone])      

## 2018-08-04 NOTE — Progress Notes (Signed)
Subjective:    Patient ID: Brittany Crawford, female    DOB: 20-Jan-1956, 62 y.o.   MRN: [id number]  HPI   Brittany Crawford is here in follow-up of her chronic pain.  I last saw her in April.  Continues to deal with  chronic low back pain. She states that things are stable. She has been using her stationary bike and building up strength. She wants to get back on her bike.  She continues on hydrocodone 7.5 one every 8 hours as needed for breakthrough pain.  This seems to help with her daily activities.  She uses Flexeril for spasms and Voltaren gel for local joint pain typically around her knees and feet.  Pain Inventory Average Pain 6 Pain Right Now 5 My pain is burning, stabbing and aching  In the last 24 hours, has pain interfered with the following? General activity 7 Relation with others 8 Enjoyment of life 9 What TIME of day is your pain at its worst? evening night Sleep (in general) Good  Pain is worse with: sitting and standing Pain improves with: rest, heat/ice, therapy/exercise and medication Relief from Meds: 8  Mobility use a cane ability to climb steps?  yes do you drive?  no  Function disabled: date disabled n/a  Neuro/Psych No problems in this area  Prior Studies Any changes since last visit?  no  Physicians involved in your care Any changes since last visit?  no   Family History  Problem Relation Age of Onset  . Colon cancer Maternal Uncle   . Stroke Mother    Social History   Socioeconomic History  . Marital status: Married    Spouse name: Not on file  . Number of children: 2  . Years of education: 74  . Highest education level: Not on file  Occupational History  . Not on file  Social Needs  . Financial resource strain: Not on file  . Food insecurity:    Worry: Not on file    Inability: Not on file  . Transportation needs:    Medical: Not on file    Non-medical: Not on file  Tobacco Use  . Smoking status: Former Smoker    Packs/day:  0.00    Years: 0.00    Pack years: 0.00    Types: Cigarettes    Last attempt to quit: 03/13/2017    Years since quitting: 1.3  . Smokeless tobacco: Never Used  . Tobacco comment: 4  to 5  cigarettes daily for years  Substance and Sexual Activity  . Alcohol use: Yes    Alcohol/week: 1.0 standard drinks    Types: 1 Glasses of wine per week    Comment: occasionaly   . Drug use: No  . Sexual activity: Yes  Lifestyle  . Physical activity:    Days per week: Not on file    Minutes per session: Not on file  . Stress: Not on file  Relationships  . Social connections:    Talks on phone: Not on file    Gets together: Not on file    Attends religious service: Not on file    Active member of club or organization: Not on file    Attends meetings of clubs or organizations: Not on file    Relationship status: Not on file  Other Topics Concern  . Not on file  Social History Narrative   Patient is married with 2 children.   Patient is right handed.   Patient has  hs education.   Patient drinks 4 cups daily.   Past Surgical History:  Procedure Laterality Date  . BREAST BIOPSY Left 04/29/2016  . BREAST BIOPSY Left 04/23/2016  . DILATATION & CURRETTAGE/HYSTEROSCOPY WITH RESECTOCOPE N/A 12/10/2012   Procedure: DILATATION & CURETTAGE/HYSTEROSCOPY WITH RESECTOCOPE;  Surgeon: Marvene Staff, MD;  Location: [facility];  Service: Gynecology;  Laterality: N/A;  . FOOT SURGERY     left-pins placed  . LYMPH NODE BIOPSY    . POLYPECTOMY N/A 12/10/2012   Procedure: POLYPECTOMY;  Surgeon: Marvene Staff, MD;  Location: [facility];  Service: Gynecology;  Laterality: N/A;  . STRABISMUS SURGERY Left 04/17/2017   Procedure: REPAIR STRABISMUS LEFT EYE;  Surgeon: Everitt Amber, MD;  Location: [facility];  Service: Ophthalmology;  Laterality: Left;   Past Medical History:  Diagnosis Date  . Chronic back pain   . H/O sarcoidosis   . Hypertension   . Seasonal allergies   . Stroke ([place])     BP 134/80   Pulse 71   Ht 5' 7.5" (1.715 m)   Wt 203 lb (92.1 kg)   LMP 11/13/2012   SpO2 94%   BMI 31.33 kg/m    Opioid Risk Score:   Fall Risk Score:  `1  Depression screen PHQ 2/9  Depression screen [facility] 2/9 08/04/2018 02/02/2018 11/30/2017 09/28/2017 02/11/2017 12/12/2016 04/28/2016  Decreased Interest 0 0 0 0 0 0 0  Down, Depressed, Hopeless 0 0 0 0 0 0 0  PHQ - 2 Score 0 0 0 0 0 0 0  Altered sleeping - 0 - - - - -  Tired, decreased energy - 0 - - - - -  Change in appetite - 0 - - - - -  Feeling bad or failure about yourself  - 0 - - - - -  Trouble concentrating - 0 - - - - -  Moving slowly or fidgety/restless - 0 - - - - -  Suicidal thoughts - 0 - - - - -  PHQ-9 Score - 0 - - - - -    Review of Systems  Constitutional: Negative.   HENT: Negative.   Eyes: Negative.   Respiratory: Negative.   Cardiovascular: Negative.   Gastrointestinal: Negative.   Endocrine: Negative.   Genitourinary: Negative.   Musculoskeletal: Negative.   Skin: Negative.   Allergic/Immunologic: Negative.   Neurological: Negative.   Hematological: Negative.   Psychiatric/Behavioral: Negative.   All other systems reviewed and are negative.      Objective:   Physical Exam General: No acute distress. Weight stable HEENT: EOMI, oral membranes moist Cards: reg rate  Chest: normal effort Abdomen: Soft, NT, ND Skin: dry, intact Extremities: no edema  Neuro:balance in tact.Cognitively intactNo obvious facial weakness. Sensory exam is normal on right and 1+ on left face, arm, leg--stable.Reflexes are 1+ in all 4's. Fine motor coordination is intact. No tremors. Motor function is grossly 5/5throughout Musculoskeletal:excellent lumbar flexion. Can almost touch toes. More difficulties with coming to extended position.  Psych:pleasant  Assessment & Plan:  1. Lumbar spondylosis with DDD and facet arthropathy. Sx appear most prominent at L5-S1 on exam  2. Right knee pain---resolve 3.  Right thalamic/internal capsule lacunar infarct with persistent left hemisensory deficits and pain syndrome.  4. ?old BI as youth   5. Hx of marijuana use.    Plan:  1.Continue diclofenac for breakthrough pain.this is effective.  No refill needed today. 2. Consider lumbar medial branch blocks bilaterally at L4-5 and L5-S1  potentially for low back. 3. Hydrocodone 5/325 one q8prn for breakthrough pain---continue at#75. We will continue the controlled substance monitoring program, this consists of regular clinic visits, examinations, routine drug screening, pill counts as well as use of [place] Controlled -Substance Reporting System. [place].   Medication was refilled and a second prescription was sent to the patient's pharmacy for next month.   4. Continue with HEP. Free-style bike with safety!! Encouraged her to use her stationary bike more regularly first to build up core strength.  Reviewed principles at length today in the office. 5.continue flexeril 5mg  q8 prn for spasms, night time predominantly  6. Follow up with NP in 41months. 15  minutes of face to face patient care time were spent during this visit. All questions were encouraged and answered.

## 2018-08-12 ENCOUNTER — Telehealth: Payer: Self-pay

## 2018-08-12 DIAGNOSIS — Z23 Encounter for immunization: Secondary | ICD-10-CM | POA: Diagnosis not present

## 2018-08-12 NOTE — Telephone Encounter (Signed)
Pt called stating Voltaren Gel was denied. I called the pharmacy to verify and the pharmacist stated that insurance will pay for brand name but brand name is on back order and the generic needs an PA. The pharmacy will be faxing the PA over. I informed pt of what was going on.

## 2018-08-16 ENCOUNTER — Other Ambulatory Visit: Payer: Self-pay | Admitting: *Deleted

## 2018-08-16 MED ORDER — DICLOFENAC SODIUM 1 % TD GEL: 2.0000 g | Freq: Four times a day (QID) | TRANSDERMAL | 2 refills | Status: AC

## 2018-09-01 ENCOUNTER — Ambulatory Visit: Payer: Medicare Other

## 2018-09-01 ENCOUNTER — Ambulatory Visit (INDEPENDENT_AMBULATORY_CARE_PROVIDER_SITE_OTHER): Payer: Medicare Other | Admitting: Nurse Practitioner

## 2018-09-01 ENCOUNTER — Encounter: Payer: Medicare Other | Admitting: Nurse Practitioner

## 2018-09-01 DIAGNOSIS — E782 Mixed hyperlipidemia: Secondary | ICD-10-CM

## 2018-09-01 DIAGNOSIS — G4485 Primary stabbing headache: Secondary | ICD-10-CM | POA: Diagnosis not present

## 2018-09-01 DIAGNOSIS — M47816 Spondylosis without myelopathy or radiculopathy, lumbar region: Secondary | ICD-10-CM | POA: Diagnosis not present

## 2018-09-01 DIAGNOSIS — Z139 Encounter for screening, unspecified: Secondary | ICD-10-CM

## 2018-09-01 DIAGNOSIS — I10 Essential (primary) hypertension: Secondary | ICD-10-CM

## 2018-09-01 DIAGNOSIS — I639 Cerebral infarction, unspecified: Secondary | ICD-10-CM

## 2018-09-01 DIAGNOSIS — Z0000 Encounter for general adult medical examination without abnormal findings: Secondary | ICD-10-CM

## 2018-09-01 DIAGNOSIS — Z113 Encounter for screening for infections with a predominantly sexual mode of transmission: Secondary | ICD-10-CM

## 2018-09-01 LAB — POCT URINALYSIS DIPSTICK
Bilirubin, UA: NEGATIVE
Blood, UA: NEGATIVE
GLUCOSE UA: NEGATIVE
Ketones, UA: NEGATIVE
Nitrite, UA: NEGATIVE
Protein, UA: NEGATIVE
Spec Grav, UA: 1.015 (ref 1.010–1.025)
Urobilinogen, UA: 0.2 E.U./dL
pH, UA: 7 (ref 5.0–8.0)

## 2018-09-01 LAB — POCT UA - MICROALBUMIN
Albumin/Creatinine Ratio, Urine, POC: 30
Creatinine, POC: 200 mg/dL
Microalbumin Ur, POC: 10 mg/L

## 2018-09-01 NOTE — Progress Notes (Addendum)
Subjective:     Patient ID: Brittany Crawford , female    DOB: [birth year] , 62 y.o.   MRN: [id number]   Chief Complaint  Patient presents with  . Annual Exam   The patient states she uses post menopausal status for birth control. Last LMP was Patient's last menstrual period was 11/13/2012.. Negative for Dysmenorrhea and Negative for Menorrhagia Mammogram last done 03/29/18 Negative for: breast discharge, breast lump(s), breast pain and breast self exam.  Pertinent negatives include abnormal bleeding (hematology), anxiety, decreased libido, depression, difficulty falling sleep, dyspareunia, history of infertility, nocturia, sexual dysfunction, sleep disturbances, urinary incontinence, urinary urgency, vaginal discharge and vaginal itching. Diet regular.The patient states her exercise level is  2 - 3 times per week.   . The patient's tobacco use is:  Social History   Tobacco Use  Smoking Status Former Smoker  . Packs/day: 0.00  . Years: 0.00  . Pack years: 0.00  . Types: Cigarettes  . Last attempt to quit: 03/13/2017  . Years since quitting: 1.6  Smokeless Tobacco Never Used  Tobacco Comment   4  to 5  cigarettes daily for years  . She has been exposed to passive smoke. The patient's alcohol use is:  Social History   Substance and Sexual Activity  Alcohol Use Yes  . Alcohol/week: 1.0 standard drinks  . Types: 1 Glasses of wine per week   Comment: occasionaly   . Additional information: Last pap reported 2 years ago by patient,  next one scheduled for 2020.   HPI  Hyperlipidemia - she has stopped the livalo due to headaches.   Hypertension  This is a chronic problem. The current episode started more than 1 year ago. The problem is unchanged. The problem is controlled. Pertinent negatives include no anxiety. Past treatments include angiotensin blockers. There are no compliance problems.  There is no history of angina. There is no history of chronic renal disease.     Past Medical  History:  Diagnosis Date  . Chronic back pain   . H/O sarcoidosis   . Hypertension   . Seasonal allergies   . Stroke [facility])      Family History  Problem Relation Age of Onset  . Colon cancer Maternal Uncle   . Stroke Mother      Current Outpatient Medications:  .  amLODipine (NORVASC) 5 MG tablet, Take 1 tablet (5 mg total) by mouth daily., Disp: 30 tablet, Rfl: 2 .  aspirin 325 MG tablet, Take 1 tablet (325 mg total) by mouth daily., Disp: 30 tablet, Rfl: 3 .  cetirizine (ZYRTEC) 10 MG tablet, Take 10 mg by mouth daily as needed for allergies., Disp: , Rfl:  .  diclofenac sodium (VOLTAREN) 1 % GEL, Apply 2 g topically 4 (four) times daily., Disp: 300 g, Rfl: 2 .  Hypromellose (ARTIFICIAL TEARS OP), Place 1 drop into the right eye daily as needed (for dry eyes)., Disp: , Rfl:  .  Multiple Vitamin (MULTIVITAMIN WITH MINERALS) TABS tablet, Take 1 tablet by mouth daily., Disp: , Rfl:  .  Olopatadine HCl 0.2 % SOLN, Apply 1 drop to eye daily as needed for allergies., Disp: , Rfl: 3 .  prednisoLONE acetate (PRED FORTE) 1 % ophthalmic suspension, Place 1 drop into the left eye as needed (flare ups). , Disp: , Rfl:  .  triamcinolone cream (KENALOG) 0.1 %, Apply 1 application topically at bedtime as needed (for rash)., Disp: 30 g, Rfl: 0 .  Ascorbic Acid (VITAMIN C)  1000 MG tablet, Take 1,000 mg by mouth daily., Disp: , Rfl:  .  Cholecalciferol (D3 ADULT PO), Take 400 Units by mouth., Disp: , Rfl:  .  fluconazole (DIFLUCAN) 150 MG tablet, Take 1 tab now then repeat in 5 days, Disp: 2 tablet, Rfl: 0 .  hydrochlorothiazide (HYDRODIURIL) 12.5 MG tablet, Take 1 tablet (12.5 mg total) by mouth daily., Disp: 90 tablet, Rfl: 1 .  HYDROcodone-acetaminophen (NORCO) 7.5-325 MG tablet, Take 1 tablet by mouth every 8 (eight) hours as needed for moderate pain., Disp: 85 tablet, Rfl: 0 .  losartan (COZAAR) 100 MG tablet, Take 1 tablet (100 mg total) by mouth daily., Disp: 90 tablet, Rfl: 1 .  Pitavastatin  Calcium (LIVALO) 2 MG TABS, Take 2 mg by mouth., Disp: , Rfl:  .  topiramate (TOPAMAX) 25 MG tablet, Take 1 tablet (25 mg total) by mouth 2 (two) times daily., Disp: 120 tablet, Rfl: 3   No Known Allergies   Review of Systems  Constitutional: Negative.   HENT: Negative.   Eyes: Negative.   Respiratory: Negative.   Cardiovascular: Negative.   Gastrointestinal: Negative.   Endocrine: Negative.   Genitourinary: Negative.   Musculoskeletal: Negative.   Skin: Negative.   Allergic/Immunologic: Negative.   Neurological: Negative.   Hematological: Negative.   Psychiatric/Behavioral: Negative.      There were no vitals filed for this visit. There is no height or weight on file to calculate BMI.   Objective:  Physical Exam  Constitutional: She is oriented to person, place, and time. Vital signs are normal. She appears well-developed and well-nourished.  HENT:  Head: Normocephalic and atraumatic.  Right Ear: Hearing, tympanic membrane, external ear and ear canal normal.  Left Ear: Hearing, tympanic membrane, external ear and ear canal normal.  Nose: Nose normal.  Mouth/Throat: Oropharynx is clear and moist.  Eyes: Pupils are equal, round, and reactive to light. Conjunctivae, EOM and lids are normal.  Fundoscopic exam:      The right eye shows no papilledema.       The left eye shows no papilledema.  Neck: Normal range of motion and full passive range of motion without pain. Neck supple. Carotid bruit is not present. No thyroid mass present.  Cardiovascular: Normal rate, regular rhythm, normal heart sounds, intact distal pulses and normal pulses.  No murmur heard. Pulmonary/Chest: Effort normal and breath sounds normal.  Abdominal: Soft. Bowel sounds are normal.  Musculoskeletal: Normal range of motion.  Neurological: She is alert and oriented to person, place, and time. She has normal strength. No cranial nerve deficit or sensory deficit.  Skin: Skin is warm and dry. Capillary  refill takes less than 2 seconds.  Psychiatric: She has a normal mood and affect. Her behavior is normal. Judgment and thought content normal.  Vitals reviewed.       Assessment And Plan:     1. Health maintenance examination . Behavior modifications discussed and diet history reviewed.   . Pt will continue to exercise regularly and modify diet with low GI, plant based foods and decrease intake of processed foods.  . Recommend intake of daily multivitamin, Vitamin D, and calcium.  . Recommend mammogram and colonoscopy for preventive screenings, as well as recommend immunizations that include influenza, TDAP  2. Essential hypertension, benign  Chronic, controlled  Continue with current medications - POCT Urinalysis Dipstick (81002) - POCT UA - Microalbumin - EKG 12-Lead  3. Mixed hyperlipidemia  Chronic, fair control  She has stopped taking livalo due to  headaches.  Continue with current medications  4. Spondylosis of lumbar region without myelopathy or radiculopathy    [name], FNP    Subjective:   TANIYA DASHER is a 62 y.o. female who presents for an Initial Medicare Annual Wellness Visit.  Review of Systems    See above        Objective:    There were no vitals filed for this visit. There is no height or weight on file to calculate BMI.  Advanced Directives 07/29/2017 06/10/2017 04/17/2017 04/10/2017 02/11/2017 10/15/2016 04/28/2016  Does Patient Have a Medical Advance Directive? No No No No No Yes No  Does patient want to make changes to medical advance directive? - - - - Yes (MAU/Ambulatory/Procedural Areas - Information given) - -  Would patient like information on creating a medical advance directive? - - No - Patient declined - - - No - patient declined information  Pre-existing out of facility DNR order (yellow form or pink MOST form) - - - - - - -    Current Medications (verified) Outpatient Encounter Medications as of 09/01/2018  Medication Sig   . amLODipine (NORVASC) 5 MG tablet Take 1 tablet (5 mg total) by mouth daily.  Marland Kitchen aspirin 325 MG tablet Take 1 tablet (325 mg total) by mouth daily.  . cetirizine (ZYRTEC) 10 MG tablet Take 10 mg by mouth daily as needed for allergies.  Marland Kitchen diclofenac sodium (VOLTAREN) 1 % GEL Apply 2 g topically 4 (four) times daily.  . Hypromellose (ARTIFICIAL TEARS OP) Place 1 drop into the right eye daily as needed (for dry eyes).  . Multiple Vitamin (MULTIVITAMIN WITH MINERALS) TABS tablet Take 1 tablet by mouth daily.  . Olopatadine HCl 0.2 % SOLN Apply 1 drop to eye daily as needed for allergies.  . prednisoLONE acetate (PRED FORTE) 1 % ophthalmic suspension Place 1 drop into the left eye as needed (flare ups).   . triamcinolone cream (KENALOG) 0.1 % Apply 1 application topically at bedtime as needed (for rash).  . [DISCONTINUED] HYDROcodone-acetaminophen (NORCO) 7.5-325 MG tablet Take 1 tablet by mouth every 8 (eight) hours as needed for moderate pain.  . [DISCONTINUED] valsartan-hydrochlorothiazide (DIOVAN-HCT) 160-12.5 MG per tablet Take 1 tablet by mouth daily.  . Pitavastatin Calcium (LIVALO) 2 MG TABS Take 2 mg by mouth.  . [DISCONTINUED] cyclobenzaprine (FLEXERIL) 5 MG tablet Take 1 tablet (5 mg total) by mouth 3 (three) times daily as needed for muscle spasms. (Patient not taking: Reported on 09/01/2018)  . [DISCONTINUED] methocarbamol (ROBAXIN) 500 MG tablet Take 1 tablet (500 mg total) by mouth 2 (two) times daily as needed for muscle spasms. (Patient not taking: Reported on 09/01/2018)  . [DISCONTINUED] Multiple Vitamins-Minerals (WOMENS ONE DAILY PO) Take 1 tablet by mouth daily.  . [DISCONTINUED] tobramycin-dexamethasone (TOBRADEX) ophthalmic ointment Place 1 application into the left eye 2 (two) times daily. (Patient not taking: Reported on 09/01/2018)   No facility-administered encounter medications on file as of 09/01/2018.     Allergies (verified) Patient has no known allergies.    History: Past Medical History:  Diagnosis Date  . Chronic back pain   . H/O sarcoidosis   . Hypertension   . Seasonal allergies   . Stroke [facility])    Past Surgical History:  Procedure Laterality Date  . BREAST BIOPSY Left 04/29/2016  . BREAST BIOPSY Left 04/23/2016  . DILATATION & CURRETTAGE/HYSTEROSCOPY WITH RESECTOCOPE N/A 12/10/2012   Procedure: DILATATION & CURETTAGE/HYSTEROSCOPY WITH RESECTOCOPE;  Surgeon: Marvene Staff, MD;  Location: [facility];  Service: Gynecology;  Laterality: N/A;  . FOOT SURGERY     left-pins placed  . LYMPH NODE BIOPSY    . POLYPECTOMY N/A 12/10/2012   Procedure: POLYPECTOMY;  Surgeon: Marvene Staff, MD;  Location: [facility];  Service: Gynecology;  Laterality: N/A;  . STRABISMUS SURGERY Left 04/17/2017   Procedure: REPAIR STRABISMUS LEFT EYE;  Surgeon: Everitt Amber, MD;  Location: [facility];  Service: Ophthalmology;  Laterality: Left;   Family History  Problem Relation Age of Onset  . Colon cancer Maternal Uncle   . Stroke Mother    Social History   Socioeconomic History  . Marital status: Married    Spouse name: Not on file  . Number of children: 2  . Years of education: 72  . Highest education level: Not on file  Occupational History  . Not on file  Social Needs  . Financial resource strain: Not on file  . Food insecurity:    Worry: Not on file    Inability: Not on file  . Transportation needs:    Medical: Not on file    Non-medical: Not on file  Tobacco Use  . Smoking status: Former Smoker    Packs/day: 0.00    Years: 0.00    Pack years: 0.00    Types: Cigarettes    Last attempt to quit: 03/13/2017    Years since quitting: 1.6  . Smokeless tobacco: Never Used  . Tobacco comment: 4  to 5  cigarettes daily for years  Substance and Sexual Activity  . Alcohol use: Yes    Alcohol/week: 1.0 standard drinks    Types: 1 Glasses of wine per week    Comment: occasionaly   . Drug use: No  . Sexual activity: Yes   Lifestyle  . Physical activity:    Days per week: Not on file    Minutes per session: Not on file  . Stress: Not on file  Relationships  . Social connections:    Talks on phone: Not on file    Gets together: Not on file    Attends religious service: Not on file    Active member of club or organization: Not on file    Attends meetings of clubs or organizations: Not on file    Relationship status: Not on file  Other Topics Concern  . Not on file  Social History Narrative   Patient is married with 2 children.   Patient is right handed.   Patient has hs education.   Patient drinks 4 cups daily.    Tobacco Counseling Counseling given: Not Answered Comment: 4  to 5  cigarettes daily for years   Clinical Intake:                        Activities of Daily Living No flowsheet data found.   Immunizations and Health Maintenance Immunization History  Administered Date(s) Administered  . Influenza,inj,Quad PF,6+ Mos 07/08/2014  . Influenza-Unspecified 08/11/2018  . Pneumococcal Polysaccharide-23 07/08/2014   Health Maintenance Due  Topic Date Due  . PAP SMEAR-Modifier  09/09/1979    Patient Care Team: [name], FNP as PCP - General (General Practice)  Indicate any recent Medical Services you may have received from other than [facility] providers in the past year (date may be approximate).     Assessment:   This is a routine wellness examination for Leshawn.  Hearing/Vision screen No exam data present  Dietary issues  and exercise activities discussed:    Goals   None    Depression Screen PHQ 2/9 Scores 09/24/2018 09/15/2018 09/01/2018 08/04/2018 02/02/2018 11/30/2017 09/28/2017  PHQ - 2 Score 0 0 0 0 0 0 0  PHQ- 9 Score - - - - 0 - -    Fall Risk Fall Risk  09/24/2018 09/15/2018 09/01/2018 08/04/2018 06/08/2018  Falls in the past year? 0 0 0 No No  Risk for fall due to : - - - - -    Is the patient's home free of loose throw rugs in walkways,  pet beds, electrical cords, etc?   yes      Grab bars in the bathroom? no      Handrails on the stairs?   yes      Adequate lighting?   yes  Timed Get Up and Go Performed less than 3 seconds  Cognitive Function:        not done  Screening Tests Health Maintenance  Topic Date Due  . PAP SMEAR-Modifier  09/09/1979  . TETANUS/TDAP  09/02/2019 (Originally 09/08/1977)  . MAMMOGRAM  03/29/2020  . COLONOSCOPY  07/05/2022  . INFLUENZA VACCINE  Completed  . Hepatitis C Screening  Completed  . HIV Screening  Completed    Qualifies for Shingles Vaccine? Yes, declines  Cancer Screenings: Lung: Low Dose CT Chest recommended if Age 26-80 years, 30 pack-year currently smoking OR have quit w/in 15years. Patient does not qualify. Breast: Up to date on Mammogram? Yes   Up to date of Bone Density/Dexa? No Colorectal: up to date due in 2023  Additional Screenings:  Hepatitis C Screening:      Plan:   See above  I have personally reviewed and noted the following in the patient's chart:   . Medical and social history . Use of alcohol, tobacco or illicit drugs  . Current medications and supplements . Functional ability and status . Nutritional status . Physical activity . Advanced directives . List of other physicians . Hospitalizations, surgeries, and ER visits in previous 12 months . Vitals . Screenings to include cognitive, depression, and falls . Referrals and appointments  In addition, I have reviewed and discussed with patient certain preventive protocols, quality metrics, and best practice recommendations. A written personalized care plan for preventive services as well as general preventive health recommendations were provided to patient.     [name], FNP   11/10/2018

## 2018-09-01 NOTE — Patient Instructions (Signed)
Lipo B high dose vitamin B injections.   Health Maintenance, Female Adopting a healthy lifestyle and getting preventive care can go a long way to promote health and wellness. Talk with your health care provider about what schedule of regular examinations is right for you. This is a good chance for you to check in with your provider about disease prevention and staying healthy. In between checkups, there are plenty of things you can do on your own. Experts have done a lot of research about which lifestyle changes and preventive measures are most likely to keep you healthy. Ask your health care provider for more information. Weight and diet Eat a healthy diet  Be sure to include plenty of vegetables, fruits, low-fat dairy products, and lean protein.  Do not eat a lot of foods high in solid fats, added sugars, or salt.  Get regular exercise. This is one of the most important things you can do for your health. ? Most adults should exercise for at least 150 minutes each week. The exercise should increase your heart rate and make you sweat (moderate-intensity exercise). ? Most adults should also do strengthening exercises at least twice a week. This is in addition to the moderate-intensity exercise.  Maintain a healthy weight  Body mass index (BMI) is a measurement that can be used to identify possible weight problems. It estimates body fat based on height and weight. Your health care provider can help determine your BMI and help you achieve or maintain a healthy weight.  For females 63 years of age and older: ? A BMI below 18.5 is considered underweight. ? A BMI of 18.5 to 24.9 is normal. ? A BMI of 25 to 29.9 is considered overweight. ? A BMI of 30 and above is considered obese.  Watch levels of cholesterol and blood lipids  You should start having your blood tested for lipids and cholesterol at 60 years of age, then have this test every 5 years.  You may need to have your cholesterol  levels checked more often if: ? Your lipid or cholesterol levels are high. ? You are older than 60 years of age. ? You are at high risk for heart disease.  Cancer screening Lung Cancer  Lung cancer screening is recommended for adults 88-42 years old who are at high risk for lung cancer because of a history of smoking.  A yearly low-dose CT scan of the lungs is recommended for people who: ? Currently smoke. ? Have quit within the past 15 years. ? Have at least a 30-pack-year history of smoking. A pack year is smoking an average of one pack of cigarettes a day for 1 year.  Yearly screening should continue until it has been 15 years since you quit.  Yearly screening should stop if you develop a health problem that would prevent you from having lung cancer treatment.  Breast Cancer  Practice breast self-awareness. This means understanding how your breasts normally appear and feel.  It also means doing regular breast self-exams. Let your health care provider know about any changes, no matter how small.  If you are in your 20s or 30s, you should have a clinical breast exam (CBE) by a health care provider every 1-3 years as part of a regular health exam.  If you are 49 or older, have a CBE every year. Also consider having a breast X-ray (mammogram) every year.  If you have a family history of breast cancer, talk to your health care provider  provider about genetic screening.  If you are at high risk for breast cancer, talk to your health care provider about having an MRI and a mammogram every year.  Breast cancer gene (BRCA) assessment is recommended for women who have family members with BRCA-related cancers. BRCA-related cancers include: ? Breast. ? Ovarian. ? Tubal. ? Peritoneal cancers.  Results of the assessment will determine the need for genetic counseling and BRCA1 and BRCA2 testing.  Cervical Cancer Your health care provider may recommend that you be screened regularly for cancer of  the pelvic organs (ovaries, uterus, and vagina). This screening involves a pelvic examination, including checking for microscopic changes to the surface of your cervix (Pap test). You may be encouraged to have this screening done every 3 years, beginning at age 21.  For women ages 30-65, health care providers may recommend pelvic exams and Pap testing every 3 years, or they may recommend the Pap and pelvic exam, combined with testing for human papilloma virus (HPV), every 5 years. Some types of HPV increase your risk of cervical cancer. Testing for HPV may also be done on women of any age with unclear Pap test results.  Other health care providers may not recommend any screening for nonpregnant women who are considered low risk for pelvic cancer and who do not have symptoms. Ask your health care provider if a screening pelvic exam is right for you.  If you have had past treatment for cervical cancer or a condition that could lead to cancer, you need Pap tests and screening for cancer for at least 20 years after your treatment. If Pap tests have been discontinued, your risk factors (such as having a new sexual partner) need to be reassessed to determine if screening should resume. Some women have medical problems that increase the chance of getting cervical cancer. In these cases, your health care provider may recommend more frequent screening and Pap tests.  Colorectal Cancer  This type of cancer can be detected and often prevented.  Routine colorectal cancer screening usually begins at 60 years of age and continues through 60 years of age.  Your health care provider may recommend screening at an earlier age if you have risk factors for colon cancer.  Your health care provider may also recommend using home test kits to check for hidden blood in the stool.  A small camera at the end of a tube can be used to examine your colon directly (sigmoidoscopy or colonoscopy). This is done to check for the  earliest forms of colorectal cancer.  Routine screening usually begins at age 50.  Direct examination of the colon should be repeated every 5-10 years through 60 years of age. However, you may need to be screened more often if early forms of precancerous polyps or small growths are found.  Skin Cancer  Check your skin from head to toe regularly.  Tell your health care provider about any new moles or changes in moles, especially if there is a change in a mole's shape or color.  Also tell your health care provider if you have a mole that is larger than the size of a pencil eraser.  Always use sunscreen. Apply sunscreen liberally and repeatedly throughout the day.  Protect yourself by wearing long sleeves, pants, a wide-brimmed hat, and sunglasses whenever you are outside.  Heart disease, diabetes, and high blood pressure  High blood pressure causes heart disease and increases the risk of stroke. High blood pressure is more likely to develop in: ?   People who have blood pressure in the high end of the normal range (130-139/85-89 mm Hg). ? People who are overweight or obese. ? People who are African American.  If you are 18-39 years of age, have your blood pressure checked every 3-5 years. If you are 40 years of age or older, have your blood pressure checked every year. You should have your blood pressure measured twice-once when you are at a hospital or clinic, and once when you are not at a hospital or clinic. Record the average of the two measurements. To check your blood pressure when you are not at a hospital or clinic, you can use: ? An automated blood pressure machine at a pharmacy. ? A home blood pressure monitor.  If you are between 55 years and 79 years old, ask your health care provider if you should take aspirin to prevent strokes.  Have regular diabetes screenings. This involves taking a blood sample to check your fasting blood sugar level. ? If you are at a normal weight and  have a low risk for diabetes, have this test once every three years after 60 years of age. ? If you are overweight and have a high risk for diabetes, consider being tested at a younger age or more often. Preventing infection Hepatitis B  If you have a higher risk for hepatitis B, you should be screened for this virus. You are considered at high risk for hepatitis B if: ? You were born in a country where hepatitis B is common. Ask your health care provider which countries are considered high risk. ? Your parents were born in a high-risk country, and you have not been immunized against hepatitis B (hepatitis B vaccine). ? You have HIV or AIDS. ? You use needles to inject street drugs. ? You live with someone who has hepatitis B. ? You have had sex with someone who has hepatitis B. ? You get hemodialysis treatment. ? You take certain medicines for conditions, including cancer, organ transplantation, and autoimmune conditions.  Hepatitis C  Blood testing is recommended for: ? Everyone born from 1945 through 1965. ? Anyone with known risk factors for hepatitis C.  Sexually transmitted infections (STIs)  You should be screened for sexually transmitted infections (STIs) including gonorrhea and chlamydia if: ? You are sexually active and are younger than 60 years of age. ? You are older than 60 years of age and your health care provider tells you that you are at risk for this type of infection. ? Your sexual activity has changed since you were last screened and you are at an increased risk for chlamydia or gonorrhea. Ask your health care provider if you are at risk.  If you do not have HIV, but are at risk, it may be recommended that you take a prescription medicine daily to prevent HIV infection. This is called pre-exposure prophylaxis (PrEP). You are considered at risk if: ? You are sexually active and do not regularly use condoms or know the HIV status of your partner(s). ? You take drugs by  injection. ? You are sexually active with a partner who has HIV.  Talk with your health care provider about whether you are at high risk of being infected with HIV. If you choose to begin PrEP, you should first be tested for HIV. You should then be tested every 3 months for as long as you are taking PrEP. Pregnancy  If you are premenopausal and you may become pregnant, ask your health   care provider about preconception counseling.  If you may become pregnant, take 400 to 800 micrograms (mcg) of folic acid every day.  If you want to prevent pregnancy, talk to your health care provider about birth control (contraception). Osteoporosis and menopause  Osteoporosis is a disease in which the bones lose minerals and strength with aging. This can result in serious bone fractures. Your risk for osteoporosis can be identified using a bone density scan.  If you are 38 years of age or older, or if you are at risk for osteoporosis and fractures, ask your health care provider if you should be screened.  Ask your health care provider whether you should take a calcium or vitamin D supplement to lower your risk for osteoporosis.  Menopause may have certain physical symptoms and risks.  Hormone replacement therapy may reduce some of these symptoms and risks. Talk to your health care provider about whether hormone replacement therapy is right for you. Follow these instructions at home:  Schedule regular health, dental, and eye exams.  Stay current with your immunizations.  Do not use any tobacco products including cigarettes, chewing tobacco, or electronic cigarettes.  If you are pregnant, do not drink alcohol.  If you are breastfeeding, limit how much and how often you drink alcohol.  Limit alcohol intake to no more than 1 drink per day for nonpregnant women. One drink equals 12 ounces of beer, 5 ounces of wine, or 1 ounces of hard liquor.  Do not use street drugs.  Do not share needles.  Ask  your health care provider for help if you need support or information about quitting drugs.  Tell your health care provider if you often feel depressed.  Tell your health care provider if you have ever been abused or do not feel safe at home. This information is not intended to replace advice given to you by your health care provider. Make sure you discuss any questions you have with your health care provider. Document Released: 04/14/2011 Document Revised: 03/06/2016 Document Reviewed: 07/03/2015 Elsevier Interactive Patient Education  [employer].

## 2018-09-02 LAB — CMP14 + ANION GAP
A/G RATIO: 1.4 (ref 1.2–2.2)
ALT: 16 IU/L (ref 0–32)
AST: 16 IU/L (ref 0–40)
Albumin: 4.6 g/dL (ref 3.5–5.5)
Alkaline Phosphatase: 63 IU/L (ref 39–117)
Anion Gap: 17 mmol/L (ref 10.0–18.0)
BILIRUBIN TOTAL: 0.6 mg/dL (ref 0.0–1.2)
BUN / CREAT RATIO: 8 — AB (ref 9–23)
BUN: 9 mg/dL (ref 6–24)
CHLORIDE: 100 mmol/L (ref 96–106)
CO2: 21 mmol/L (ref 20–29)
Calcium: 10.4 mg/dL — ABNORMAL HIGH (ref 8.7–10.2)
Creatinine, Ser: 1.19 mg/dL — ABNORMAL HIGH (ref 0.57–1.00)
GFR, EST AFRICAN AMERICAN: 58 mL/min/{1.73_m2} — AB (ref >59)
GFR, EST NON AFRICAN AMERICAN: 50 mL/min/{1.73_m2} — AB (ref >59)
GLOBULIN, TOTAL: 3.3 g/dL (ref 1.5–4.5)
GLUCOSE: 97 mg/dL (ref 65–99)
Potassium: 3.7 mmol/L (ref 3.5–5.2)
SODIUM: 138 mmol/L (ref 134–144)
TOTAL PROTEIN: 7.9 g/dL (ref 6.0–8.5)

## 2018-09-02 LAB — HIV ANTIBODY (ROUTINE TESTING W REFLEX): HIV SCREEN 4TH GENERATION: NONREACTIVE

## 2018-09-02 LAB — LIPID PANEL
CHOL/HDL RATIO: 4 ratio (ref 0.0–4.4)
Cholesterol, Total: 213 mg/dL — ABNORMAL HIGH (ref 100–199)
HDL: 53 mg/dL (ref >39)
LDL Calculated: 116 mg/dL — ABNORMAL HIGH (ref 0–99)
Triglycerides: 219 mg/dL — ABNORMAL HIGH (ref 0–149)
VLDL Cholesterol Cal: 44 mg/dL — ABNORMAL HIGH (ref 5–40)

## 2018-09-02 LAB — CBC
HEMATOCRIT: 38.1 % (ref 34.0–46.6)
Hemoglobin: 12.7 g/dL (ref 11.1–15.9)
MCH: 28.8 pg (ref 26.6–33.0)
MCHC: 33.3 g/dL (ref 31.5–35.7)
MCV: 86 fL (ref 79–97)
Platelets: 323 10*3/uL (ref 150–450)
RBC: 4.41 x10E6/uL (ref 3.77–5.28)
RDW: 13.5 % (ref 12.3–15.4)
WBC: 5.7 10*3/uL (ref 3.4–10.8)

## 2018-09-02 LAB — HEPATITIS C ANTIBODY: Hep C Virus Ab: <0.1 s/co ratio (ref 0.0–0.9)

## 2018-09-15 ENCOUNTER — Ambulatory Visit (INDEPENDENT_AMBULATORY_CARE_PROVIDER_SITE_OTHER): Payer: Medicare Other | Admitting: Nurse Practitioner

## 2018-09-15 ENCOUNTER — Other Ambulatory Visit (HOSPITAL_COMMUNITY)
Admission: RE | Admit: 2018-09-15 | Discharge: 2018-09-15 | Disposition: A | Discharge status: Home / Self Care | Payer: Medicare Other | Source: Ambulatory Visit | Attending: Nurse Practitioner | Admitting: Nurse Practitioner

## 2018-09-15 ENCOUNTER — Encounter: Payer: Self-pay | Admitting: Nurse Practitioner

## 2018-09-15 VITALS — BP 110/70 | HR 71 | Temp 98.1°F | Ht 66.5 in | Wt 201.4 lb

## 2018-09-15 DIAGNOSIS — I10 Essential (primary) hypertension: Secondary | ICD-10-CM | POA: Diagnosis not present

## 2018-09-15 DIAGNOSIS — N760 Acute vaginitis: Secondary | ICD-10-CM | POA: Insufficient documentation

## 2018-09-15 DIAGNOSIS — R300 Dysuria: Secondary | ICD-10-CM | POA: Insufficient documentation

## 2018-09-15 LAB — POCT URINALYSIS DIPSTICK
Bilirubin, UA: NEGATIVE
GLUCOSE UA: NEGATIVE
KETONES UA: NEGATIVE
NITRITE UA: NEGATIVE
PROTEIN UA: NEGATIVE
Spec Grav, UA: 1.01 (ref 1.010–1.025)
Urobilinogen, UA: 0.2 E.U./dL
pH, UA: 7 (ref 5.0–8.0)

## 2018-09-15 MED ORDER — FLUCONAZOLE 150 MG PO TABS: ORAL_TABLET | ORAL | 0 refills | Status: DC

## 2018-09-15 MED ORDER — LOSARTAN POTASSIUM 100 MG PO TABS: 100.0000 mg | ORAL_TABLET | Freq: Every day | ORAL | 1 refills | Status: DC

## 2018-09-15 MED ORDER — HYDROCHLOROTHIAZIDE 12.5 MG PO TABS: 12.5000 mg | ORAL_TABLET | Freq: Every day | ORAL | 1 refills | Status: DC

## 2018-09-15 NOTE — Progress Notes (Signed)
Subjective:     Patient ID: Brittany Crawford , female    DOB: 1956-02-01 , 62 y.o.   MRN: [id number]   Chief Complaint  Patient presents with  . Dysuria    HPI  Dysuria   This is a new problem. The current episode started in the past 7 days. The problem occurs every urination. The problem has been gradually worsening. There has been no fever. She is sexually active. There is no history of pyelonephritis. Associated symptoms include a discharge (thin white discharge). Pertinent negatives include no chills, hematuria, urgency or vomiting. She has tried nothing for the symptoms. There is no history of kidney stones or recurrent UTIs.     Past Medical History:  Diagnosis Date  . Chronic back pain   . H/O sarcoidosis   . Hypertension   . Seasonal allergies   . Stroke [facility])      Family History  Problem Relation Age of Onset  . Colon cancer Maternal Uncle   . Stroke Mother      Current Outpatient Medications:  .  amLODipine (NORVASC) 5 MG tablet, Take 1 tablet (5 mg total) by mouth daily., Disp: 30 tablet, Rfl: 2 .  aspirin 325 MG tablet, Take 1 tablet (325 mg total) by mouth daily., Disp: 30 tablet, Rfl: 3 .  cetirizine (ZYRTEC) 10 MG tablet, Take 10 mg by mouth daily as needed for allergies., Disp: , Rfl:  .  diclofenac sodium (VOLTAREN) 1 % GEL, Apply 2 g topically 4 (four) times daily., Disp: 300 g, Rfl: 2 .  HYDROcodone-acetaminophen (NORCO) 7.5-325 MG tablet, Take 1 tablet by mouth every 8 (eight) hours as needed for moderate pain., Disp: 85 tablet, Rfl: 0 .  Hypromellose (ARTIFICIAL TEARS OP), Place 1 drop into the right eye daily as needed (for dry eyes)., Disp: , Rfl:  .  Multiple Vitamin (MULTIVITAMIN WITH MINERALS) TABS tablet, Take 1 tablet by mouth daily., Disp: , Rfl:  .  Olopatadine HCl 0.2 % SOLN, Apply 1 drop to eye daily as needed for allergies., Disp: , Rfl: 3 .  prednisoLONE acetate (PRED FORTE) 1 % ophthalmic suspension, Place 1 drop into the left eye as  needed (flare ups). , Disp: , Rfl:  .  triamcinolone cream (KENALOG) 0.1 %, Apply 1 application topically at bedtime as needed (for rash)., Disp: 30 g, Rfl: 0 .  valsartan-hydrochlorothiazide (DIOVAN-HCT) 160-12.5 MG per tablet, Take 1 tablet by mouth daily., Disp: , Rfl: 4 .  Pitavastatin Calcium (LIVALO) 2 MG TABS, Take 2 mg by mouth., Disp: , Rfl:    No Known Allergies   Review of Systems  Constitutional: Negative for chills.  Respiratory: Negative.   Cardiovascular: Negative.   Gastrointestinal: Negative.  Negative for vomiting.  Genitourinary: Positive for dysuria. Negative for hematuria and urgency.  Musculoskeletal: Negative.        Left lateral side pain   Skin: Negative.   Neurological: Negative for dizziness and headaches.     Today's Vitals   09/15/18 1037  BP: 110/70  Pulse: 71  Temp: 98.1 F (36.7 C)  TempSrc: Oral  SpO2: 94%  Weight: 201 lb 6.4 oz (91.4 kg)  Height: 5' 6.5" (1.689 m)  PainSc: 2    Body mass index is 32.02 kg/m.   Objective:  Physical Exam  Constitutional: She is oriented to person, place, and time. She appears well-developed and well-nourished.  Cardiovascular: Normal rate, regular rhythm, normal heart sounds and intact distal pulses.  No murmur heard. Pulmonary/Chest: Effort  normal and breath sounds normal.  Abdominal: Soft. Bowel sounds are normal. She exhibits no distension and no mass. There is no tenderness.  Genitourinary: No vaginal discharge found.  Neurological: She is alert and oriented to person, place, and time.  Skin: Skin is warm and dry. No erythema.  Psychiatric: She has a normal mood and affect.  Vitals reviewed.       Assessment And Plan:     1. Dysuria  Large leukocytes, negative nitrates will send for urine culture - POCT Urinalysis Dipstick (81002)  2. Acute vaginitis  See # 1 - fluconazole (DIFLUCAN) 150 MG tablet; Take 1 tab now then repeat in 5 days  Dispense: 2 tablet; Refill: 0  3. Essential  hypertension, benign  Chronic, controlled  Her pharmacy is unable to get the combination losartan/hctz, we will split it up to losartan 100 mg and Hctz 12.5 mg.   - losartan (COZAAR) 100 MG tablet; Take 1 tablet (100 mg total) by mouth daily.  Dispense: 90 tablet; Refill: 1 - hydrochlorothiazide (HYDRODIURIL) 12.5 MG tablet; Take 1 tablet (12.5 mg total) by mouth daily.  Dispense: 90 tablet; Refill: [place], FNP

## 2018-09-16 LAB — URINE CULTURE: ORGANISM ID, BACTERIA: NO GROWTH

## 2018-09-17 LAB — CYTOLOGY, (ORAL, ANAL, URETHRAL) ANCILLARY ONLY
Candida vaginitis: NEGATIVE
Chlamydia: NEGATIVE
Neisseria Gonorrhea: NEGATIVE
Trichomonas: NEGATIVE

## 2018-09-20 NOTE — Progress Notes (Signed)
Okay; thanks.

## 2018-09-24 ENCOUNTER — Encounter: Payer: Medicare Other | Attending: Physical Medicine & Rehabilitation | Admitting: Registered Nurse

## 2018-09-24 ENCOUNTER — Other Ambulatory Visit: Payer: Self-pay

## 2018-09-24 ENCOUNTER — Encounter: Payer: Self-pay | Admitting: Registered Nurse

## 2018-09-24 VITALS — BP 110/75 | HR 81 | Ht 67.5 in | Wt 203.0 lb

## 2018-09-24 DIAGNOSIS — H53462 Homonymous bilateral field defects, left side: Secondary | ICD-10-CM | POA: Diagnosis not present

## 2018-09-24 DIAGNOSIS — Z79891 Long term (current) use of opiate analgesic: Secondary | ICD-10-CM

## 2018-09-24 DIAGNOSIS — M25561 Pain in right knee: Secondary | ICD-10-CM | POA: Insufficient documentation

## 2018-09-24 DIAGNOSIS — I6381 Other cerebral infarction due to occlusion or stenosis of small artery: Secondary | ICD-10-CM | POA: Diagnosis not present

## 2018-09-24 DIAGNOSIS — M546 Pain in thoracic spine: Secondary | ICD-10-CM | POA: Diagnosis not present

## 2018-09-24 DIAGNOSIS — M62830 Muscle spasm of back: Secondary | ICD-10-CM | POA: Diagnosis not present

## 2018-09-24 DIAGNOSIS — M545 Low back pain: Secondary | ICD-10-CM | POA: Diagnosis not present

## 2018-09-24 DIAGNOSIS — H5211 Myopia, right eye: Secondary | ICD-10-CM | POA: Diagnosis not present

## 2018-09-24 DIAGNOSIS — G894 Chronic pain syndrome: Secondary | ICD-10-CM

## 2018-09-24 DIAGNOSIS — G8929 Other chronic pain: Secondary | ICD-10-CM | POA: Diagnosis not present

## 2018-09-24 DIAGNOSIS — M129 Arthropathy, unspecified: Secondary | ICD-10-CM | POA: Diagnosis not present

## 2018-09-24 DIAGNOSIS — H524 Presbyopia: Secondary | ICD-10-CM | POA: Diagnosis not present

## 2018-09-24 DIAGNOSIS — I69398 Other sequelae of cerebral infarction: Secondary | ICD-10-CM | POA: Diagnosis not present

## 2018-09-24 DIAGNOSIS — H2513 Age-related nuclear cataract, bilateral: Secondary | ICD-10-CM | POA: Diagnosis not present

## 2018-09-24 DIAGNOSIS — M5136 Other intervertebral disc degeneration, lumbar region: Secondary | ICD-10-CM | POA: Diagnosis not present

## 2018-09-24 DIAGNOSIS — M479 Spondylosis, unspecified: Secondary | ICD-10-CM | POA: Insufficient documentation

## 2018-09-24 DIAGNOSIS — Z5181 Encounter for therapeutic drug level monitoring: Secondary | ICD-10-CM

## 2018-09-24 DIAGNOSIS — H31002 Unspecified chorioretinal scars, left eye: Secondary | ICD-10-CM | POA: Diagnosis not present

## 2018-09-24 DIAGNOSIS — M47816 Spondylosis without myelopathy or radiculopathy, lumbar region: Secondary | ICD-10-CM

## 2018-09-24 MED ORDER — HYDROCODONE-ACETAMINOPHEN 7.5-325 MG PO TABS: 1.0000 | ORAL_TABLET | Freq: Three times a day (TID) | ORAL | 0 refills | Status: DC | PRN

## 2018-09-24 NOTE — Progress Notes (Signed)
Subjective:    Patient ID: Brittany Crawford, female    DOB: Dec 09, 1955, 62 y.o.   MRN: [id number]  HPI: Brittany Crawford is a 62 y.o. female who returns for follow up appointment for chronic pain and medication refill.She states her pain is located in her mid-lower back pain. She rates her pain 3. Her current exercise regime is walking ,riding stationary bicycle for 5 minutes twice a week  and performing stretching exercises.  Brittany Crawford Morphine equivalent is 22.77 MME.  Last Oral Swab was Performed on 06/08/2018, it was consistent.    Pain Inventory Average Pain 8 Pain Right Now 3 My pain is sharp  In the last 24 hours, has pain interfered with the following? General activity 6 Relation with others 8 Enjoyment of life 9 What TIME of day is your pain at its worst? morning and evening Sleep (in general) Fair  Pain is worse with: bending and standing Pain improves with: rest, heat/ice and medication Relief from Meds: 9  Mobility walk with assistance use a cane do you drive?  no  Function disabled: date disabled n/a  Neuro/Psych No problems in this area  Prior Studies Any changes since last visit?  no  Physicians involved in your care Any changes since last visit?  no   Family History  Problem Relation Age of Onset  . Colon cancer Maternal Uncle   . Stroke Mother    Social History   Socioeconomic History  . Marital status: Married    Spouse name: Not on file  . Number of children: 2  . Years of education: 59  . Highest education level: Not on file  Occupational History  . Not on file  Social Needs  . Financial resource strain: Not on file  . Food insecurity:    Worry: Not on file    Inability: Not on file  . Transportation needs:    Medical: Not on file    Non-medical: Not on file  Tobacco Use  . Smoking status: Former Smoker    Packs/day: 0.00    Years: 0.00    Pack years: 0.00    Types: Cigarettes    Last attempt to quit: 03/13/2017   Years since quitting: 1.5  . Smokeless tobacco: Never Used  . Tobacco comment: 4  to 5  cigarettes daily for years  Substance and Sexual Activity  . Alcohol use: Yes    Alcohol/week: 1.0 standard drinks    Types: 1 Glasses of wine per week    Comment: occasionaly   . Drug use: No  . Sexual activity: Yes  Lifestyle  . Physical activity:    Days per week: Not on file    Minutes per session: Not on file  . Stress: Not on file  Relationships  . Social connections:    Talks on phone: Not on file    Gets together: Not on file    Attends religious service: Not on file    Active member of club or organization: Not on file    Attends meetings of clubs or organizations: Not on file    Relationship status: Not on file  Other Topics Concern  . Not on file  Social History Narrative   Patient is married with 2 children.   Patient is right handed.   Patient has hs education.   Patient drinks 4 cups daily.   Past Surgical History:  Procedure Laterality Date  . BREAST BIOPSY Left 04/29/2016  . BREAST BIOPSY Left  04/23/2016  . DILATATION & CURRETTAGE/HYSTEROSCOPY WITH RESECTOCOPE N/A 12/10/2012   Procedure: DILATATION & CURETTAGE/HYSTEROSCOPY WITH RESECTOCOPE;  Surgeon: Marvene Staff, MD;  Location: [facility];  Service: Gynecology;  Laterality: N/A;  . FOOT SURGERY     left-pins placed  . LYMPH NODE BIOPSY    . POLYPECTOMY N/A 12/10/2012   Procedure: POLYPECTOMY;  Surgeon: Marvene Staff, MD;  Location: [facility];  Service: Gynecology;  Laterality: N/A;  . STRABISMUS SURGERY Left 04/17/2017   Procedure: REPAIR STRABISMUS LEFT EYE;  Surgeon: Everitt Amber, MD;  Location: [facility];  Service: Ophthalmology;  Laterality: Left;   Past Medical History:  Diagnosis Date  . Chronic back pain   . H/O sarcoidosis   . Hypertension   . Seasonal allergies   . Stroke ([place])    BP 110/75   Pulse 81   Ht 5' 7.5" (1.715 m)   Wt 203 lb (92.1 kg)   LMP 11/13/2012   SpO2 96%    BMI 31.33 kg/m   Opioid Risk Score:   Fall Risk Score:  `1  Depression screen PHQ 2/9  Depression screen [facility] 2/9 09/24/2018 09/15/2018 09/01/2018 08/04/2018 02/02/2018 11/30/2017 09/28/2017  Decreased Interest 0 0 0 0 0 0 0  Down, Depressed, Hopeless 0 0 0 0 0 0 0  PHQ - 2 Score 0 0 0 0 0 0 0  Altered sleeping - - - - 0 - -  Tired, decreased energy - - - - 0 - -  Change in appetite - - - - 0 - -  Feeling bad or failure about yourself  - - - - 0 - -  Trouble concentrating - - - - 0 - -  Moving slowly or fidgety/restless - - - - 0 - -  Suicidal thoughts - - - - 0 - -  PHQ-9 Score - - - - 0 - -     Review of Systems  Constitutional: Negative.   HENT: Negative.   Eyes: Negative.   Respiratory: Negative.   Cardiovascular: Negative.   Gastrointestinal: Negative.   Endocrine: Negative.   Genitourinary: Negative.   Musculoskeletal: Negative.   Skin: Negative.   Allergic/Immunologic: Negative.   Neurological: Negative.   Hematological: Negative.   Psychiatric/Behavioral: Negative.   All other systems reviewed and are negative.      Objective:   Physical Exam Vitals signs and nursing note reviewed.  Constitutional:      Appearance: Normal appearance.  Neck:     Musculoskeletal: Normal range of motion and neck supple.  Cardiovascular:     Rate and Rhythm: Normal rate and regular rhythm.     Pulses: Normal pulses.     Heart sounds: Normal heart sounds.  Pulmonary:     Effort: Pulmonary effort is normal.     Breath sounds: Normal breath sounds.  Musculoskeletal:     Comments: Normal Muscle Bulk and Muscle Testing Reveals: Upper Extremities: Full ROM and Muscle Strength 5/5  Thoracic Paraspinal Tenderness: T-7- T-9   Lower Extremities: Full ROM and Muscle Strength 5/5 Arises from Table with Ease   Narrow Based Gait   Skin:    General: Skin is warm and dry.  Neurological:     Mental Status: She is alert and oriented to person, place, and time.  Psychiatric:         Mood and Affect: Mood normal.        Behavior: Behavior normal.  Assessment & Plan:  1. Lumbar spondylosis with DDD and facet arthropathy.12//13/2019 Refilled: Hydrocodone 7.5 /325 mg one tablet every 8 hours as needed for pain #85.Second scripte-scribefor the following month. We will continue the opioid monitoring program, this consists of regular clinic visits, examinations, urine drug screen, pill counts as well as use of [place] Controlled Substance Reporting System. 2. Bilateral knee pain: No complaints voiced today:Continue with heat/ice, exercise and Diclofenac.09/24/2018 3. Right thalamic/internal capsule lacunar infarct with persistent left hemisensory deficits: Continue to Monitor.09/24/2018 4. Bilateral Greater Trochanteric Bursitis:No complaints Today.Continue current medication regime.09/24/2018  20 minutes of face to face patient care time was spent during this visit. All questions were encouraged and answered.   F/U in 1 month

## 2018-09-28 NOTE — Addendum Note (Signed)
Addended by: [name] F on: 09/28/2018 09:03 PM   Modules accepted: Level of Service

## 2018-09-30 LAB — TOXASSURE SELECT,+ANTIDEPR,UR

## 2018-10-04 ENCOUNTER — Telehealth: Payer: Self-pay | Admitting: *Deleted

## 2018-10-04 NOTE — Telephone Encounter (Signed)
Urine drug screen for this encounter is consistent for prescribed medication 

## 2018-10-27 ENCOUNTER — Ambulatory Visit (INDEPENDENT_AMBULATORY_CARE_PROVIDER_SITE_OTHER): Payer: Medicare HMO | Admitting: Neurology

## 2018-10-27 ENCOUNTER — Encounter: Payer: Self-pay | Admitting: Neurology

## 2018-10-27 ENCOUNTER — Telehealth: Payer: Self-pay | Admitting: Neurology

## 2018-10-27 VITALS — BP 110/76 | HR 81 | Ht 67.5 in | Wt 201.4 lb

## 2018-10-27 DIAGNOSIS — R519 Headache, unspecified: Secondary | ICD-10-CM

## 2018-10-27 DIAGNOSIS — M542 Cervicalgia: Secondary | ICD-10-CM | POA: Diagnosis not present

## 2018-10-27 DIAGNOSIS — G8929 Other chronic pain: Secondary | ICD-10-CM

## 2018-10-27 DIAGNOSIS — R51 Headache: Secondary | ICD-10-CM

## 2018-10-27 MED ORDER — TOPIRAMATE 25 MG PO TABS: 25.0000 mg | ORAL_TABLET | Freq: Two times a day (BID) | ORAL | 3 refills | Status: DC
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
## 2018-10-27 NOTE — Patient Instructions (Addendum)
I had a long discussion with the patient regarding her chronic occipital headache and posterior neck pain which may be due to mechanical musculoskeletal etiology but need to rule out structural brain and neck.  I recommend trial of Topamax 25 mg twice daily to be increased if tolerated without side effects.  I also encouraged her to do neck stretching exercises.  Check MRI scan of the brain and cervical spine to look for any compressive lesions.  She will continue on aspirin for stroke prevention and maintain strict control of hypertension with blood pressure goal below 130/90, lipids with LDL cholesterol goal below 70 mg percent.  She will return for follow-up in 3 months with my nurse practitioner Janett Billow or call earlier if necessary  Neck Exercises Neck exercises can be important for many reasons:  They can help you to improve and maintain flexibility in your neck. This can be especially important as you age.  They can help to make your neck stronger. This can make movement easier.  They can reduce or prevent neck pain.  They may help your upper back. Ask your health care provider which neck exercises would be best for you. Exercises to improve neck flexibility Neck stretch Repeat this exercise 3-5 times. 1. Do this exercise while standing or while sitting in a chair. 2. Place your feet flat on the floor, shoulder-width apart. 3. Slowly turn your head to the right. Turn it all the way to the right so you can look over your right shoulder. Do not tilt or tip your head. 4. Hold this position for 10-30 seconds. 5. Slowly turn your head to the left, to look over your left shoulder. 6. Hold this position for 10-30 seconds.  Neck retraction Repeat this exercise 8-10 times. Do this 3-4 times a day or as told by your health care provider. 1. Do this exercise while standing or while sitting in a sturdy chair. 2. Look straight ahead. Do not bend your neck. 3. Use your fingers to push your chin  backward. Do not bend your neck for this movement. Continue to face straight ahead. If you are doing the exercise properly, you will feel a slight sensation in your throat and a stretch at the back of your neck. 4. Hold the stretch for 1-2 seconds. Relax and repeat. Exercises to improve neck strength Neck press Repeat this exercise 10 times. Do it first thing in the morning and right before bed or as told by your health care provider. 1. Lie on your back on a firm bed or on the floor with a pillow under your head. 2. Use your neck muscles to push your head down on the pillow and straighten your spine. 3. Hold the position as well as you can. Keep your head facing up and your chin tucked. 4. Slowly count to 5 while holding this position. 5. Relax for a few seconds. Then repeat. Isometric strengthening Do a full set of these exercises 2 times a day or as told by your health care provider. 1. Sit in a supportive chair and place your hand on your forehead. 2. Push forward with your head and neck while pushing back with your hand. Hold for 10 seconds. 3. Relax. Then repeat the exercise 3 times. 4. Next, do thesequence again, this time putting your hand against the back of your head. Use your head and neck to push backward against the hand pressure. 5. Finally, do the same exercise on either side of your head, pushing sideways  against the pressure of your hand. Prone head lifts Repeat this exercise 5 times. Do this 2 times a day or as told by your health care provider. 1. Lie face-down, resting on your elbows so that your chest and upper back are raised. 2. Start with your head facing downward, near your chest. Position your chin either on or near your chest. 3. Slowly lift your head upward. Lift until you are looking straight ahead. Then continue lifting your head as far back as you can stretch. 4. Hold your head up for 5 seconds. Then slowly lower it to your starting position. Supine head  lifts Repeat this exercise 8-10 times. Do this 2 times a day or as told by your health care provider. 1. Lie on your back, bending your knees to point to the ceiling and keeping your feet flat on the floor. 2. Lift your head slowly off the floor, raising your chin toward your chest. 3. Hold for 5 seconds. 4. Relax and repeat. Scapular retraction Repeat this exercise 5 times. Do this 2 times a day or as told by your health care provider. 1. Stand with your arms at your sides. Look straight ahead. 2. Slowly pull both shoulders backward and downward until you feel a stretch between your shoulder blades in your upper back. 3. Hold for 10-30 seconds. 4. Relax and repeat. Contact a health care provider if:  Your neck pain or discomfort gets much worse when you do an exercise.  Your neck pain or discomfort does not improve within 2 hours after you exercise. If you have any of these problems, stop exercising right away. Do not do the exercises again unless your health care provider says that you can. Get help right away if:  You develop sudden, severe neck pain. If this happens, stop exercising right away. Do not do the exercises again unless your health care provider says that you can. This information is not intended to replace advice given to you by your health care provider. Make sure you discuss any questions you have with your health care provider. Document Released: 09/10/2015 Document Revised: 02/02/2018 Document Reviewed: 04/09/2015 Elsevier Interactive Patient Education  2019 [employer].

## 2018-10-27 NOTE — Telephone Encounter (Signed)
[employer] medicare/medicaid order sent to [facility] for pt to be aware. I gave her [facility] phone number of [phone] and to give them a call if she has not heard from them in the next 2-3 business days.

## 2018-11-04 ENCOUNTER — Telehealth: Payer: Self-pay

## 2018-11-04 DIAGNOSIS — M47816 Spondylosis without myelopathy or radiculopathy, lumbar region: Secondary | ICD-10-CM

## 2018-11-04 MED ORDER — HYDROCODONE-ACETAMINOPHEN 7.5-325 MG PO TABS: 1.0000 | ORAL_TABLET | Freq: Three times a day (TID) | ORAL | 0 refills | Status: DC | PRN

## 2018-11-04 NOTE — Telephone Encounter (Signed)
Return Brittany Crawford call, PMP was reviewed. Last hydrocodone prescription was filled on 10/04/2018/ Hydrocodone e-scribe. Ms. Char is a aware of the above.

## 2018-11-04 NOTE — Telephone Encounter (Signed)
Pt called stating her insurance changed so she had to change pharmacy's. She changed from [facility] to [facility] and they will not transfer her Hydrocodone/APAP prescription. I called [facility] and cancelled the prescription so need new one sent to [facility].

## 2018-11-08 NOTE — Telephone Encounter (Signed)
Dorian Pod with [facility] informed me that her [employer] is not in their net work.. I called her insurance and spoke to [facility] and she informed me that [facility] is in her net work.Meda Coffee called [employer] and started her case for the MRI's case # [id number] I faxed the notes.

## 2018-11-08 NOTE — Telephone Encounter (Signed)
I spoke to the patient and informed her of this and once it has been approve I will let her know that I have faxed the order to [facility].

## 2018-11-10 NOTE — Addendum Note (Signed)
Addended by: [name] F on: 11/10/2018 08:15 AM   Modules accepted: Miquel Dunn

## 2018-11-11 ENCOUNTER — Other Ambulatory Visit: Payer: Medicare Other

## 2018-11-11 NOTE — Telephone Encounter (Signed)
Try scheduling peer to peer review with Janett Billow as I will be out next few days

## 2018-11-11 NOTE — Telephone Encounter (Signed)
[employer] medicare did not approve the MRI Cervical spine.. they did approve the Brain.  "Based on medicare national coverage determination and [employer] spine imaging guidelines, we cannot approve this request. MRI might be supported in the evaluation of suspected or known spinal disease with one of the following. Failure to improve after a recent (within three months) six week trial of physician-guided clinical care with clinical re-evaluation. Any signs or symptoms such as significant motor weakness, malignancy, infection, cauda equina syndrome, for which conservative treatment is not needed."  A peer to peer is an option their phone number is [phone] and the case number is [id number]. The peer to peer has to be scheduled by February 3rd.

## 2018-11-12 NOTE — Telephone Encounter (Signed)
I called the number again at [phone] I explain ed the MD is out of office. Janett Billow NP will be doing peer to peer for MD. Dr. Janan Halter will be calling her cell at 115pm [place] time for peer to peer. Case number is [id number].

## 2018-11-17 NOTE — Telephone Encounter (Signed)
Thanks for trying
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
## 2018-11-18 NOTE — Telephone Encounter (Signed)
yes

## 2018-11-18 NOTE — Telephone Encounter (Signed)
After the peer to peer was completed it looks like [employer] Medicare approved the MRI Brain but not the MRI Cervical. Do you want her to proceed on having the MRI Brain??

## 2018-11-22 ENCOUNTER — Encounter: Payer: Self-pay | Admitting: Registered Nurse

## 2018-11-22 ENCOUNTER — Encounter: Payer: Medicare HMO | Attending: Registered Nurse | Admitting: Registered Nurse

## 2018-11-22 VITALS — BP 104/65 | HR 88 | Ht 67.5 in | Wt 199.0 lb

## 2018-11-22 DIAGNOSIS — G894 Chronic pain syndrome: Secondary | ICD-10-CM | POA: Diagnosis not present

## 2018-11-22 DIAGNOSIS — M7062 Trochanteric bursitis, left hip: Secondary | ICD-10-CM | POA: Diagnosis not present

## 2018-11-22 DIAGNOSIS — Z79899 Other long term (current) drug therapy: Secondary | ICD-10-CM | POA: Diagnosis not present

## 2018-11-22 DIAGNOSIS — M7061 Trochanteric bursitis, right hip: Secondary | ICD-10-CM | POA: Diagnosis not present

## 2018-11-22 DIAGNOSIS — M5136 Other intervertebral disc degeneration, lumbar region: Secondary | ICD-10-CM | POA: Diagnosis not present

## 2018-11-22 DIAGNOSIS — I10 Essential (primary) hypertension: Secondary | ICD-10-CM | POA: Diagnosis not present

## 2018-11-22 DIAGNOSIS — Z800 Family history of malignant neoplasm of digestive organs: Secondary | ICD-10-CM | POA: Insufficient documentation

## 2018-11-22 DIAGNOSIS — Z79891 Long term (current) use of opiate analgesic: Secondary | ICD-10-CM

## 2018-11-22 DIAGNOSIS — M25561 Pain in right knee: Secondary | ICD-10-CM | POA: Insufficient documentation

## 2018-11-22 DIAGNOSIS — Z5181 Encounter for therapeutic drug level monitoring: Secondary | ICD-10-CM | POA: Diagnosis not present

## 2018-11-22 DIAGNOSIS — M47816 Spondylosis without myelopathy or radiculopathy, lumbar region: Secondary | ICD-10-CM | POA: Diagnosis present

## 2018-11-22 DIAGNOSIS — M129 Arthropathy, unspecified: Secondary | ICD-10-CM | POA: Diagnosis not present

## 2018-11-22 DIAGNOSIS — M25562 Pain in left knee: Secondary | ICD-10-CM | POA: Insufficient documentation

## 2018-11-22 DIAGNOSIS — Z760 Encounter for issue of repeat prescription: Secondary | ICD-10-CM | POA: Diagnosis not present

## 2018-11-22 DIAGNOSIS — Z87891 Personal history of nicotine dependence: Secondary | ICD-10-CM | POA: Diagnosis not present

## 2018-11-22 MED ORDER — HYDROCODONE-ACETAMINOPHEN 7.5-325 MG PO TABS: 1.0000 | ORAL_TABLET | Freq: Three times a day (TID) | ORAL | 0 refills | Status: DC | PRN

## 2018-11-22 NOTE — Progress Notes (Signed)
Subjective:    Patient ID: Brittany Crawford, female    DOB: [birth year], 63 y.o.   MRN: [id number]  HPI: Brittany Crawford is a 63 y.o. female who returns for follow up appointment for chronic pain and medication refill. She states her pain is located in her lower back and right hip. She rates her pain 5.  Her current exercise regime is walking and performing stretching exercises.  Brittany Crawford equivalent is 22.77 MME.  Last UDS was Performed on 09/24/2018, it was consistent.   Pain Inventory Average Pain 8 Pain Right Now 5 My pain is sharp and aching  In the last 24 hours, has pain interfered with the following? General activity 0 Relation with others 0 Enjoyment of life 0 What TIME of day is your pain at its worst? evening Sleep (in general) Fair  Pain is worse with: walking and bending Pain improves with: rest, heat/ice and medication Relief from Meds: 9  Mobility use a cane ability to climb steps?  no do you drive?  no  Function disabled: date disabled .  Neuro/Psych trouble walking  Prior Studies Any changes since last visit?  no  Physicians involved in your care Any changes since last visit?  no   Family History  Problem Relation Age of Onset  . Colon cancer Maternal Uncle   . Stroke Mother    Social History   Socioeconomic History  . Marital status: Married    Spouse name: Not on file  . Number of children: 2  . Years of education: 13  . Highest education level: Not on file  Occupational History  . Not on file  Social Needs  . Financial resource strain: Not on file  . Food insecurity:    Worry: Not on file    Inability: Not on file  . Transportation needs:    Medical: Not on file    Non-medical: Not on file  Tobacco Use  . Smoking status: Former Smoker    Packs/day: 0.00    Years: 0.00    Pack years: 0.00    Types: Cigarettes    Last attempt to quit: 03/13/2017    Years since quitting: 1.6  . Smokeless tobacco: Never Used  .  Tobacco comment: 4  to 5  cigarettes daily for years  Substance and Sexual Activity  . Alcohol use: Yes    Alcohol/week: 1.0 standard drinks    Types: 1 Glasses of wine per week    Comment: occasionaly   . Drug use: No  . Sexual activity: Yes  Lifestyle  . Physical activity:    Days per week: Not on file    Minutes per session: Not on file  . Stress: Not on file  Relationships  . Social connections:    Talks on phone: Not on file    Gets together: Not on file    Attends religious service: Not on file    Active member of club or organization: Not on file    Attends meetings of clubs or organizations: Not on file    Relationship status: Not on file  Other Topics Concern  . Not on file  Social History Narrative   Patient is married with 2 children.   Patient is right handed.   Patient has hs education.   Patient drinks 4 cups daily.   Past Surgical History:  Procedure Laterality Date  . BREAST BIOPSY Left 04/29/2016  . BREAST BIOPSY Left 04/23/2016  . DILATATION & CURRETTAGE/HYSTEROSCOPY  WITH RESECTOCOPE N/A 12/10/2012   Procedure: DILATATION & CURETTAGE/HYSTEROSCOPY WITH RESECTOCOPE;  Surgeon: Marvene Staff, MD;  Location: [facility];  Service: Gynecology;  Laterality: N/A;  . FOOT SURGERY     left-pins placed  . LYMPH NODE BIOPSY    . POLYPECTOMY N/A 12/10/2012   Procedure: POLYPECTOMY;  Surgeon: Marvene Staff, MD;  Location: [facility];  Service: Gynecology;  Laterality: N/A;  . STRABISMUS SURGERY Left 04/17/2017   Procedure: REPAIR STRABISMUS LEFT EYE;  Surgeon: Everitt Amber, MD;  Location: [facility];  Service: Ophthalmology;  Laterality: Left;   Past Medical History:  Diagnosis Date  . Chronic back pain   . H/O sarcoidosis   . Hypertension   . Seasonal allergies   . Stroke ([place])    BP 104/65   Pulse 88   Ht 5' 7.5" (1.715 m)   Wt 199 lb (90.3 kg)   LMP 11/13/2012   SpO2 93%   BMI 30.71 kg/m   Opioid Risk Score:   Fall Risk Score:   `1  Depression screen PHQ 2/9  Depression screen [facility] 2/9 09/24/2018 09/15/2018 09/01/2018 08/04/2018 02/02/2018 11/30/2017 09/28/2017  Decreased Interest 0 0 0 0 0 0 0  Down, Depressed, Hopeless 0 0 0 0 0 0 0  PHQ - 2 Score 0 0 0 0 0 0 0  Altered sleeping - - - - 0 - -  Tired, decreased energy - - - - 0 - -  Change in appetite - - - - 0 - -  Feeling bad or failure about yourself  - - - - 0 - -  Trouble concentrating - - - - 0 - -  Moving slowly or fidgety/restless - - - - 0 - -  Suicidal thoughts - - - - 0 - -  PHQ-9 Score - - - - 0 - -     Review of Systems  Constitutional: Negative.   HENT: Negative.   Eyes: Negative.   Respiratory: Negative.   Cardiovascular: Negative.   Gastrointestinal: Negative.   Endocrine: Negative.   Genitourinary: Negative.   Musculoskeletal: Positive for arthralgias, gait problem and myalgias.  Skin: Negative.   Allergic/Immunologic: Negative.   Hematological: Negative.   Psychiatric/Behavioral: Negative.   All other systems reviewed and are negative.      Objective:   Physical Exam Vitals signs and nursing note reviewed.  Constitutional:      Appearance: Normal appearance.  Neck:     Musculoskeletal: Normal range of motion and neck supple.  Cardiovascular:     Rate and Rhythm: Normal rate and regular rhythm.     Pulses: Normal pulses.     Heart sounds: Normal heart sounds.  Pulmonary:     Effort: Pulmonary effort is normal.     Breath sounds: Normal breath sounds.  Musculoskeletal:     Comments: Normal Muscle Bulk and Muscle Testing Reveals:  Upper Extremities:Full  ROM and Muscle Strength 5/5  Lumbar Paraspinal Tenderness: L-4-L-5 Right Greater Trochanter Tenderness Lower Extremities: Full ROM and Muscle Strength 5/5 Arises from Table with Ease Narrow Based  Gait   Skin:    General: Skin is warm and dry.  Neurological:     Mental Status: She is alert and oriented to person, place, and time.  Psychiatric:        Mood and Affect:  Mood normal.        Behavior: Behavior normal.           Assessment & Plan:  1. Lumbar spondylosis with DDD and facet arthropathy.02//07/2019 Refilled:Hydrocodone 7.5 /325 mg one tablet every 8 hours as needed for pain #85.Second scripte-scribefor the following month. We will continue the opioid monitoring program, this consists of regular clinic visits, examinations, urine drug screen, pill counts as well as use of [place] Controlled Substance Reporting System. 2. Bilateral knee pain: No complaints voiced today:Continue with heat/ice, exercise and Diclofenac.11/22/2018 3. Right thalamic/internal capsule lacunar infarct with persistent left hemisensory deficits: Continue to Monitor.11/22/2018 4. Bilateral Greater Trochanteric Bursitis:No complaints Today.Continue current medication regime.11/22/2018  20 minutes of face to face patient care time was spent during this visit. All questions were encouraged and answered.   F/U in 1 month

## 2018-11-22 NOTE — Telephone Encounter (Signed)
I faxed order to [facility] they will contact the patient to schedule.

## 2018-11-29 ENCOUNTER — Ambulatory Visit: Payer: Medicare Other | Admitting: Registered Nurse

## 2018-12-02 ENCOUNTER — Ambulatory Visit: Payer: Medicaid Other | Admitting: Nurse Practitioner

## 2018-12-02 ENCOUNTER — Ambulatory Visit: Payer: Medicare Other | Admitting: Nurse Practitioner

## 2018-12-09 ENCOUNTER — Ambulatory Visit (INDEPENDENT_AMBULATORY_CARE_PROVIDER_SITE_OTHER): Payer: Medicare HMO | Admitting: Nurse Practitioner

## 2018-12-09 ENCOUNTER — Encounter: Payer: Self-pay | Admitting: Nurse Practitioner

## 2018-12-09 VITALS — BP 120/82 | HR 91 | Temp 98.6°F | Wt 198.2 lb

## 2018-12-09 DIAGNOSIS — I129 Hypertensive chronic kidney disease with stage 1 through stage 4 chronic kidney disease, or unspecified chronic kidney disease: Secondary | ICD-10-CM

## 2018-12-09 DIAGNOSIS — N183 Chronic kidney disease, stage 3 unspecified: Secondary | ICD-10-CM | POA: Insufficient documentation

## 2018-12-09 DIAGNOSIS — E782 Mixed hyperlipidemia: Secondary | ICD-10-CM

## 2018-12-09 DIAGNOSIS — I10 Essential (primary) hypertension: Secondary | ICD-10-CM

## 2018-12-09 NOTE — Progress Notes (Signed)
Subjective:     Patient ID: Brittany Crawford , female    DOB: [birth year] , 63 y.o.   MRN: [id number]   Chief Complaint  Patient presents with  . other    Hypertensive    HPI  She is to have a CT scan of her head in the next week (Dr. Leonie Man).  She is scheduled to see him in April.    Hypertension  This is a chronic problem. The current episode started more than 1 year ago. The problem is unchanged. The problem is controlled. Associated symptoms include headaches. Pertinent negatives include no anxiety, chest pain or palpitations. There are no associated agents to hypertension. There are no known risk factors for coronary artery disease. Past treatments include calcium channel blockers and angiotensin blockers. The current treatment provides no improvement. There are no compliance problems.  Hypertensive end-organ damage includes kidney disease and CVA. There is no history of angina. Identifiable causes of hypertension include chronic renal disease.     Past Medical History:  Diagnosis Date  . Chronic back pain   . H/O sarcoidosis   . Hypertension   . Seasonal allergies   . Stroke [facility])      Family History  Problem Relation Age of Onset  . Colon cancer Maternal Uncle   . Stroke Mother      Current Outpatient Medications:  .  amLODipine (NORVASC) 5 MG tablet, Take 1 tablet (5 mg total) by mouth daily., Disp: 30 tablet, Rfl: 2 .  Ascorbic Acid (VITAMIN C) 1000 MG tablet, Take 1,000 mg by mouth daily., Disp: , Rfl:  .  aspirin 325 MG tablet, Take 1 tablet (325 mg total) by mouth daily., Disp: 30 tablet, Rfl: 3 .  cetirizine (ZYRTEC) 10 MG tablet, Take 10 mg by mouth daily as needed for allergies., Disp: , Rfl:  .  Cholecalciferol (D3 ADULT PO), Take 400 Units by mouth., Disp: , Rfl:  .  diclofenac sodium (VOLTAREN) 1 % GEL, Apply 2 g topically 4 (four) times daily., Disp: 300 g, Rfl: 2 .  hydrochlorothiazide (HYDRODIURIL) 12.5 MG tablet, Take 1 tablet (12.5 mg total) by mouth  daily., Disp: 90 tablet, Rfl: 1 .  HYDROcodone-acetaminophen (NORCO) 7.5-325 MG tablet, Take 1 tablet by mouth every 8 (eight) hours as needed for moderate pain., Disp: 85 tablet, Rfl: 0 .  Hypromellose (ARTIFICIAL TEARS OP), Place 1 drop into the right eye daily as needed (for dry eyes)., Disp: , Rfl:  .  losartan (COZAAR) 100 MG tablet, Take 1 tablet (100 mg total) by mouth daily., Disp: 90 tablet, Rfl: 1 .  Multiple Vitamin (MULTIVITAMIN WITH MINERALS) TABS tablet, Take 1 tablet by mouth daily., Disp: , Rfl:  .  Olopatadine HCl 0.2 % SOLN, Apply 1 drop to eye daily as needed for allergies., Disp: , Rfl: 3 .  prednisoLONE acetate (PRED FORTE) 1 % ophthalmic suspension, Place 1 drop into the left eye as needed (flare ups). , Disp: , Rfl:  .  topiramate (TOPAMAX) 25 MG tablet, Take 1 tablet (25 mg total) by mouth 2 (two) times daily., Disp: 120 tablet, Rfl: 3 .  triamcinolone cream (KENALOG) 0.1 %, Apply 1 application topically at bedtime as needed (for rash)., Disp: 30 g, Rfl: 0 .  fluconazole (DIFLUCAN) 150 MG tablet, Take 1 tab now then repeat in 5 days (Patient not taking: Reported on 12/09/2018), Disp: 2 tablet, Rfl: 0 .  Pitavastatin Calcium (LIVALO) 2 MG TABS, Take 2 mg by mouth., Disp: , Rfl:  No Known Allergies   Review of Systems  Constitutional: Negative for fatigue.  HENT: Negative for sore throat.   Eyes: Negative for photophobia.  Cardiovascular: Negative.  Negative for chest pain, palpitations and leg swelling.  Endocrine: Negative for polydipsia, polyphagia and polyuria.  Neurological: Positive for headaches. Negative for dizziness.     Today's Vitals   12/09/18 0926 12/09/18 0931  BP: 122/84 120/82  Pulse: 91   Temp: 98.6 F (37 C)   SpO2: 93%   Weight: 198 lb 3.2 oz (89.9 kg)    Body mass index is 30.58 kg/m.   Objective:  Physical Exam Vitals signs reviewed.  Constitutional:      Appearance: Normal appearance. She is well-developed. She is obese. She is not  ill-appearing.  HENT:     Head: Normocephalic and atraumatic.  Eyes:     Pupils: Pupils are equal, round, and reactive to light.  Cardiovascular:     Rate and Rhythm: Normal rate and regular rhythm.     Pulses: Normal pulses.     Heart sounds: Normal heart sounds. No murmur.  Pulmonary:     Effort: Pulmonary effort is normal.     Breath sounds: Normal breath sounds.  Musculoskeletal: Normal range of motion.  Skin:    General: Skin is warm and dry.     Capillary Refill: Capillary refill takes less than 2 seconds.  Neurological:     General: No focal deficit present.     Mental Status: She is alert and oriented to person, place, and time.     Cranial Nerves: No cranial nerve deficit.  Psychiatric:        Mood and Affect: Mood normal.        Behavior: Behavior normal.        Thought Content: Thought content normal.        Judgment: Judgment normal.         Assessment And Plan:     1. Essential hypertension, benign . B/P is controlled.  Marland Kitchen BMP ordered to check renal function.  . The importance of regular exercise and dietary modification was stressed to the patient.  . She is exercising regularly - BMP8+eGFR  2. Mixed hyperlipidemia  Chronic, poorly controlled  Continue with current medications  Encouraged to increase her fiber intake - Lipid panel  [name], FNP

## 2018-12-10 LAB — BMP8+EGFR
BUN/Creatinine Ratio: 10 — ABNORMAL LOW (ref 12–28)
BUN: 12 mg/dL (ref 8–27)
CO2: 20 mmol/L (ref 20–29)
CREATININE: 1.23 mg/dL — AB (ref 0.57–1.00)
Calcium: 10.4 mg/dL — ABNORMAL HIGH (ref 8.7–10.3)
Chloride: 103 mmol/L (ref 96–106)
GFR calc Af Amer: 55 mL/min/{1.73_m2} — ABNORMAL LOW (ref >59)
GFR calc non Af Amer: 48 mL/min/{1.73_m2} — ABNORMAL LOW (ref >59)
Glucose: 124 mg/dL — ABNORMAL HIGH (ref 65–99)
POTASSIUM: 4.3 mmol/L (ref 3.5–5.2)
SODIUM: 141 mmol/L (ref 134–144)

## 2018-12-10 LAB — LIPID PANEL
Chol/HDL Ratio: 4 ratio (ref 0.0–4.4)
Cholesterol, Total: 200 mg/dL — ABNORMAL HIGH (ref 100–199)
HDL: 50 mg/dL (ref >39)
LDL Calculated: 120 mg/dL — ABNORMAL HIGH (ref 0–99)
Triglycerides: 151 mg/dL — ABNORMAL HIGH (ref 0–149)
VLDL Cholesterol Cal: 30 mg/dL (ref 5–40)

## 2019-01-19 ENCOUNTER — Telehealth: Payer: Self-pay

## 2019-01-19 NOTE — Telephone Encounter (Signed)
Opened in error

## 2019-01-25 ENCOUNTER — Other Ambulatory Visit: Payer: Self-pay

## 2019-01-25 ENCOUNTER — Encounter: Payer: Self-pay | Admitting: Physical Medicine & Rehabilitation

## 2019-01-25 ENCOUNTER — Encounter: Payer: Medicare HMO | Attending: Registered Nurse | Admitting: Physical Medicine & Rehabilitation

## 2019-01-25 ENCOUNTER — Telehealth: Payer: Self-pay

## 2019-01-25 DIAGNOSIS — I10 Essential (primary) hypertension: Secondary | ICD-10-CM | POA: Insufficient documentation

## 2019-01-25 DIAGNOSIS — M7061 Trochanteric bursitis, right hip: Secondary | ICD-10-CM | POA: Insufficient documentation

## 2019-01-25 DIAGNOSIS — Z760 Encounter for issue of repeat prescription: Secondary | ICD-10-CM | POA: Insufficient documentation

## 2019-01-25 DIAGNOSIS — M25562 Pain in left knee: Secondary | ICD-10-CM | POA: Insufficient documentation

## 2019-01-25 DIAGNOSIS — Z87891 Personal history of nicotine dependence: Secondary | ICD-10-CM | POA: Insufficient documentation

## 2019-01-25 DIAGNOSIS — Z79899 Other long term (current) drug therapy: Secondary | ICD-10-CM | POA: Insufficient documentation

## 2019-01-25 DIAGNOSIS — M5136 Other intervertebral disc degeneration, lumbar region: Secondary | ICD-10-CM | POA: Insufficient documentation

## 2019-01-25 DIAGNOSIS — M129 Arthropathy, unspecified: Secondary | ICD-10-CM | POA: Insufficient documentation

## 2019-01-25 DIAGNOSIS — G894 Chronic pain syndrome: Secondary | ICD-10-CM

## 2019-01-25 DIAGNOSIS — M47816 Spondylosis without myelopathy or radiculopathy, lumbar region: Secondary | ICD-10-CM | POA: Diagnosis not present

## 2019-01-25 DIAGNOSIS — Z800 Family history of malignant neoplasm of digestive organs: Secondary | ICD-10-CM | POA: Insufficient documentation

## 2019-01-25 DIAGNOSIS — R51 Headache: Secondary | ICD-10-CM

## 2019-01-25 DIAGNOSIS — M25561 Pain in right knee: Secondary | ICD-10-CM | POA: Insufficient documentation

## 2019-01-25 DIAGNOSIS — G4486 Cervicogenic headache: Secondary | ICD-10-CM

## 2019-01-25 DIAGNOSIS — M7062 Trochanteric bursitis, left hip: Secondary | ICD-10-CM | POA: Insufficient documentation

## 2019-01-25 MED ORDER — HYDROCODONE-ACETAMINOPHEN 7.5-325 MG PO TABS: 1.0000 | ORAL_TABLET | Freq: Three times a day (TID) | ORAL | 0 refills | Status: DC | PRN

## 2019-01-25 NOTE — Telephone Encounter (Signed)
Pt left voicemail with concerns needs a virtual visit. Left voicemail

## 2019-01-25 NOTE — Progress Notes (Signed)
Subjective:    Patient ID: Brittany Crawford, female    DOB: 1956/02/04, 63 y.o.   MRN: [id number]  HPI  Virtual Visit- patient is at home, MD at office.   This is a follow up phone call visit for Brittany Crawford. She is here in follow up of her chronic pain syndrome. She has been doing fairly well. She maintains her stationary bike and treadmill. She is a little "stiffer" in the mornings but is managing.   She does complain of ongoing headaches. The headaches are mostly in the back of her head. She has some stretches which she does that really seem to help. She also has a heating pad. She saw neurology over a month ago who ordered an MRI of her brain. Report was available to read and noted 15mm mass near pituitary gland and changes in left globe in addition to her prior CVA. She has follow up neuro visit this week. She was started on topamax in january  For pain she remains on hydrocodone as well as voltaren gel     Pain Inventory Average Pain 5 Pain Right Now 2 My pain is constant, dull and aching  In the last 24 hours, has pain interfered with the following? General activity 2 Relation with others 0 Enjoyment of life 0 What TIME of day is your pain at its worst? morning Sleep (in general) Good  Pain is worse with: bending and standing Pain improves with: heat/ice, therapy/exercise and medication Relief from Meds: 7  Mobility use a cane how many minutes can you walk? 30 do you drive?  no  Function disabled: date disabled . I need assistance with the following:  household duties and shopping  Neuro/Psych tingling trouble walking  Prior Studies Any changes since last visit?  no  Physicians involved in your care Any changes since last visit?  no   Has appt with Dr Clydene Fake office tomorrow  Family History  Problem Relation Age of Onset  . Colon cancer Maternal Uncle   . Stroke Mother    Social History   Socioeconomic History  . Marital status: Married   Spouse name: Not on file  . Number of children: 2  . Years of education: 45  . Highest education level: Not on file  Occupational History  . Not on file  Social Needs  . Financial resource strain: Not on file  . Food insecurity:    Worry: Not on file    Inability: Not on file  . Transportation needs:    Medical: Not on file    Non-medical: Not on file  Tobacco Use  . Smoking status: Former Smoker    Packs/day: 0.00    Years: 0.00    Pack years: 0.00    Types: Cigarettes    Last attempt to quit: 03/13/2017    Years since quitting: 1.8  . Smokeless tobacco: Never Used  . Tobacco comment: 4  to 5  cigarettes daily for years  Substance and Sexual Activity  . Alcohol use: Yes    Alcohol/week: 1.0 standard drinks    Types: 1 Glasses of wine per week    Comment: occasionaly   . Drug use: No  . Sexual activity: Yes  Lifestyle  . Physical activity:    Days per week: Not on file    Minutes per session: Not on file  . Stress: Not on file  Relationships  . Social connections:    Talks on phone: Not on file  Gets together: Not on file    Attends religious service: Not on file    Active member of club or organization: Not on file    Attends meetings of clubs or organizations: Not on file    Relationship status: Not on file  Other Topics Concern  . Not on file  Social History Narrative   Patient is married with 2 children.   Patient is right handed.   Patient has hs education.   Patient drinks 4 cups daily.   Past Surgical History:  Procedure Laterality Date  . BREAST BIOPSY Left 04/29/2016  . BREAST BIOPSY Left 04/23/2016  . DILATATION & CURRETTAGE/HYSTEROSCOPY WITH RESECTOCOPE N/A 12/10/2012   Procedure: DILATATION & CURETTAGE/HYSTEROSCOPY WITH RESECTOCOPE;  Surgeon: Marvene Staff, MD;  Location: [facility];  Service: Gynecology;  Laterality: N/A;  . FOOT SURGERY     left-pins placed  . LYMPH NODE BIOPSY    . POLYPECTOMY N/A 12/10/2012   Procedure: POLYPECTOMY;   Surgeon: Marvene Staff, MD;  Location: [facility];  Service: Gynecology;  Laterality: N/A;  . STRABISMUS SURGERY Left 04/17/2017   Procedure: REPAIR STRABISMUS LEFT EYE;  Surgeon: Everitt Amber, MD;  Location: [facility];  Service: Ophthalmology;  Laterality: Left;   Past Medical History:  Diagnosis Date  . Chronic back pain   . H/O sarcoidosis   . Hypertension   . Seasonal allergies   . Stroke ([place])    LMP 11/13/2012   Opioid Risk Score:   Fall Risk Score:  `1  Depression screen PHQ 2/9  Depression screen [facility] 2/9 01/25/2019 09/24/2018 09/15/2018 09/01/2018 08/04/2018 02/02/2018 11/30/2017  Decreased Interest 0 0 0 0 0 0 0  Down, Depressed, Hopeless 0 0 0 0 0 0 0  PHQ - 2 Score 0 0 0 0 0 0 0  Altered sleeping - - - - - 0 -  Tired, decreased energy - - - - - 0 -  Change in appetite - - - - - 0 -  Feeling bad or failure about yourself  - - - - - 0 -  Trouble concentrating - - - - - 0 -  Moving slowly or fidgety/restless - - - - - 0 -  Suicidal thoughts - - - - - 0 -  PHQ-9 Score - - - - - 0 -   Review of Systems  Constitutional: Negative.   HENT: Negative.   Eyes: Negative.   Respiratory: Negative.   Cardiovascular: Negative.   Gastrointestinal: Negative.   Endocrine: Negative.   Genitourinary: Negative.   Musculoskeletal: Positive for gait problem.  Skin: Negative.   Allergic/Immunologic: Negative.   Neurological:       Tingling  Hematological: Negative.   Psychiatric/Behavioral: Negative.   All other systems reviewed and are negative.      Assessment & Plan:  1. Lumbar spondylosis with DDD and facet arthropathy. Sx appear most prominent at L5-S1 on exam  2. Right knee pain---resolve 3. Right thalamic/internal capsule lacunar infarct with persistent left hemisensory deficits and pain syndrome.  4. ?old BI as youth   5. Hx of marijuana use.  6. Headaches: likely cervicogenic.   -does have small mass near pituitary. ?adenoma   Plan:   1.Continue diclofenac gel for breakthrough pain.   2. Consider lumbar medial branch blocks bilaterally at L4-5 and L5-S1potentially for low back. 3. Hydrocodone 5/325 one q8prn for breakthrough pain---continue at#75. We will continue the controlled substance monitoring program, this consists of regular clinic visits, examinations, routine  drug screening, pill counts as well as use of [place] Controlled Substance Reporting System. [place].   -Medication was refilled and a second prescription was sent to the patient's pharmacy for next month.     4. HEP ongoing. Encouraged getting outside  -reviewed cervical ROM exercises today, use of heat  -suspect headaches are cervically driven  -we can dive into these more when she returns in person to office 5.Neurology follow up as scheduled 6. Follow up with NP in 2 months. 11 phone time spent

## 2019-01-26 ENCOUNTER — Ambulatory Visit (INDEPENDENT_AMBULATORY_CARE_PROVIDER_SITE_OTHER): Payer: Medicare HMO | Admitting: Adult Health

## 2019-01-26 ENCOUNTER — Other Ambulatory Visit: Payer: Self-pay

## 2019-01-26 ENCOUNTER — Encounter: Payer: Self-pay | Admitting: Nurse Practitioner

## 2019-01-26 ENCOUNTER — Encounter: Payer: Self-pay | Admitting: Adult Health

## 2019-01-26 ENCOUNTER — Ambulatory Visit (INDEPENDENT_AMBULATORY_CARE_PROVIDER_SITE_OTHER): Payer: Medicare HMO | Admitting: Nurse Practitioner

## 2019-01-26 DIAGNOSIS — M5481 Occipital neuralgia: Secondary | ICD-10-CM

## 2019-01-26 DIAGNOSIS — N898 Other specified noninflammatory disorders of vagina: Secondary | ICD-10-CM | POA: Insufficient documentation

## 2019-01-26 DIAGNOSIS — Z8673 Personal history of transient ischemic attack (TIA), and cerebral infarction without residual deficits: Secondary | ICD-10-CM | POA: Diagnosis not present

## 2019-01-26 MED ORDER — CARBAMAZEPINE 200 MG PO TABS: 200.0000 mg | ORAL_TABLET | Freq: Every day | ORAL | 3 refills | Status: DC

## 2019-01-26 MED ORDER — FLUCONAZOLE 100 MG PO TABS: ORAL_TABLET | ORAL | 0 refills | Status: DC

## 2019-01-26 NOTE — Progress Notes (Signed)
[facility] [address] [place]. [place] [address] [phone]     Virtual Visit via Telephone Note  I connected with Brittany Crawford on 01/26/19 at 10:45 AM EDT by telephone located remotely within my own home and verified that I am speaking with the correct person using two identifiers who reports being located at her own home.    I discussed the limitations, risks, security and privacy concerns of performing an evaluation and management service by telephone and the availability of in person appointments. I also discussed with the patient that there may be a patient responsible charge related to this service. The patient expressed understanding and agreed to proceed.   History of Present Illness:  Brittany Crawford is a 63 y.o. female who has been followed in this office for posterior headaches and neck pain.  She was initially scheduled for face-to-face office visit today at this time but due to [place], face-to-face office visit rescheduled for non-face-to-face telephone visit.  She was last evaluated in this office by Dr. Leonie Crawford on 10/26/2018 due to bothersome posterior headaches and occasional neck pain.  She has trialed OTC medications without benefit, muscle relaxant without benefit and gabapentin in the past for different indication but reports severe side effects.  Underlying history of stroke and denies any new or worsening symptoms.  It was felt as though occipital headache and posterior neck pain likely related to mechanical muscle skeletal etiology but important to rule out any abnormalities within the brain or neck.  Initiated Topamax 25 mg twice daily along with recommendations of neck stretching.  MRI cervical spine and brain recommended but unfortunately insurance declined MRI cervical spine but MRI brain was completed at [facility] on 12/14/2018 which did not show any acute intracranial abnormalities but did note potential pituitary microadenoma along with  abnormality of left eyeball and to follow with ophthalmology.  She does have underlying history of left eyeball abnormalities and routinely follows with ophthalmology.  She does continue to have almost daily neck/occipital region pain despite neck exercises and use of Topamax.  She endorses a sharp pain that only lasts a couple seconds and typically occurs with change of head movement especially while looking towards her right side.  Pain scale 10/10 and located on the left side greater in the occipital region and can radiate up towards her left ear.  She denies any radiating pain down her back or into her arm.  She has had this pain for the past year and has been consistent without any worsening.      Observations/Objective:  General: Pleasant middle-aged African-American female   MRI HEAD WO W IV CONTRAST 12/14/2018 IMPRESSION:  1. Abnormal left eyeball, which is abnormally large and nonspherical. It is unclear if this is congenital or acquired. This may represent a coloboma or morning glory anomaly (congenital) or staphyloma (acquired). The differential diagnosis also includes  axial myopia (not favored) and glaucoma. Probable mild-to-moderate left optic nerve atrophy versus hypoplasia. Ophthalmology referral/consultation is recommended, if not already obtained. 2. Although this study is not optimized to evaluate the pituitary gland, there appears to be an approximately 8 mm nonenhancing or hypoenhancing lesion centered in the left side of the adenohypophysis, compatible with a microadenoma. Please correlate  clinically. If clinically warranted, a follow-up MRI of the brain/sella without and with IV contrast (pituitary protocol) could be obtained for further evaluation. 3. Old lacunar infarctions, as above. 4. No evidence of acute intracranial abnormality.   Assessment and Plan:  Brittany Crawford is a 63 year-old   female with history of right lateral thalamic/internal capsule lacunar infarct  secondary to small vessel disease in September 2015 with vascular risk factors of hypertension, hyperlipidemia, cigarette smoking and marijuana use. She has done well but has mild residual paresthesias.  She returns office due to complaints of neck/occipital region pains which have been present over the past year.  -?  Occipital neuralgia: No reported benefit with use of Topamax, recommended to discontinue and initiate Tegretol 200 mg nightly as recommended by up-to-date.  Discussion with patient regarding most beneficial therapy for occipital neuralgia is nerve block injections but patient hesitant at this time regarding injections.  She is agreeable with trialing Tegretol at this time and will consider nerve block injections if needed at follow-up visit -We will hold off on MRI cervical spine at this time -Recommended avoiding neck positions that can cause pain to occur and to continue with gentle neck stretching exercises and moist heat/ice if needed -continue aspirin for secondary stroke prevention    Follow Up Instructions:   Follow up in 3 months or call earlier if needed    I discussed the assessment and treatment plan with the patient.  The patient was provided an opportunity to ask questions and all were answered to their satisfaction. The patient agreed with the plan and verbalized an understanding of the instructions.   I provided 35 minutes of non-face-to-face time during this encounter.    Venancio Poisson, AGNP-BC  [facility] [address] [place], [place] [address]  Phone [phone] Fax [phone] Note: This document was prepared with digital dictation and possible smart phrase technology. Any transcriptional errors that result from this process are unintentional.

## 2019-01-26 NOTE — Progress Notes (Signed)
I agree with the above plan 

## 2019-01-26 NOTE — Progress Notes (Signed)
Virtual Visit via Telephone Note   This visit type was conducted due to national recommendations for restrictions regarding the COVID-19 Pandemic (e.g. social distancing) in an effort to limit this patient's exposure and mitigate transmission in our community.  Due to her co-morbid illnesses, this patient is at least at moderate risk for complications without adequate follow up.  This format is felt to be most appropriate for this patient at this time.  The patient did not have access to video technology/had technical difficulties with video requiring transitioning to audio format only (telephone).  All issues noted in this document were discussed and addressed.  No physical exam could be performed with this format.  Please refer to the patient's chart for her  consent to telehealth for [facility].  Evaluation Performed:  Follow-up visit  This visit type was conducted due to national recommendations for restrictions regarding the COVID-19 Pandemic (e.g. social distancing).  This format is felt to be most appropriate for this patient at this time.  All issues noted in this document were discussed and addressed.  No physical exam was performed (except for noted visual exam findings with Video Visits).  Please refer to the patient's chart (MyChart message for video visits and phone note for telephone visits) for the patient's consent to telehealth for [facility].  Date:  01/26/2019   ID:  [name], DOB 1956-05-10, MRN [id number]  Patient Location:  Home  Provider location:   Office    Chief Complaint:  Possible yeast infection  History of Present Illness:    Brittany HOLDSWORTH is a 63 y.o. female who presents via video conferencing for a telehealth visit today.    The patient does not have symptoms concerning for COVID-19 infection (fever, chills, cough, or new shortness of breath).   Vaginal Itching  The patient's primary symptoms include genital itching and vaginal discharge  (white some thickness). The patient's pertinent negatives include no genital odor. This is a new problem. The current episode started in the past 7 days. The problem occurs intermittently. The problem has been gradually worsening. The patient is experiencing no pain. The problem affects both sides. She is not pregnant. Pertinent negatives include no abdominal pain, chills, dysuria, fever, frequency or headaches. The vaginal discharge was white and thick. There has been no bleeding. She has not been passing clots. She has not been passing tissue. Exacerbated by: at night more irritated. Treatments tried: anti itch cream. The treatment provided mild relief. She is not sexually active. No, her partner does not have an STD. She uses nothing for contraception. Her past medical history is significant for vaginosis. There is no history of an STD.     Past Medical History:  Diagnosis Date  . Chronic back pain   . H/O sarcoidosis   . Hypertension   . Seasonal allergies   . Stroke [facility])    Past Surgical History:  Procedure Laterality Date  . BREAST BIOPSY Left 04/29/2016  . BREAST BIOPSY Left 04/23/2016  . DILATATION & CURRETTAGE/HYSTEROSCOPY WITH RESECTOCOPE N/A 12/10/2012   Procedure: DILATATION & CURETTAGE/HYSTEROSCOPY WITH RESECTOCOPE;  Surgeon: Marvene Staff, MD;  Location: [facility];  Service: Gynecology;  Laterality: N/A;  . FOOT SURGERY     left-pins placed  . LYMPH NODE BIOPSY    . POLYPECTOMY N/A 12/10/2012   Procedure: POLYPECTOMY;  Surgeon: Marvene Staff, MD;  Location: [facility];  Service: Gynecology;  Laterality: N/A;  . STRABISMUS SURGERY Left 04/17/2017   Procedure: REPAIR STRABISMUS  LEFT EYE;  Surgeon: Everitt Amber, MD;  Location: [place];  Service: Ophthalmology;  Laterality: Left;     Current Meds  Medication Sig  . amLODipine (NORVASC) 5 MG tablet Take 1 tablet (5 mg total) by mouth daily.  . Ascorbic Acid (VITAMIN C) 1000 MG tablet Take 1,000 mg by  mouth daily.  Marland Kitchen aspirin 325 MG tablet Take 1 tablet (325 mg total) by mouth daily.  . carbamazepine (TEGRETOL) 200 MG tablet Take 1 tablet (200 mg total) by mouth at bedtime.  . cetirizine (ZYRTEC) 10 MG tablet Take 10 mg by mouth daily as needed for allergies.  . Cholecalciferol (D3 ADULT PO) Take 400 Units by mouth.  . diclofenac sodium (VOLTAREN) 1 % GEL Apply 2 g topically 4 (four) times daily.  . hydrochlorothiazide (HYDRODIURIL) 12.5 MG tablet Take 1 tablet (12.5 mg total) by mouth daily.  Marland Kitchen HYDROcodone-acetaminophen (NORCO) 7.5-325 MG tablet Take 1 tablet by mouth every 8 (eight) hours as needed for moderate pain.  . Hypromellose (ARTIFICIAL TEARS OP) Place 1 drop into the right eye daily as needed (for dry eyes).  Marland Kitchen losartan (COZAAR) 100 MG tablet Take 1 tablet (100 mg total) by mouth daily.  . Multiple Vitamin (MULTIVITAMIN WITH MINERALS) TABS tablet Take 1 tablet by mouth daily.  . Olopatadine HCl 0.2 % SOLN Apply 1 drop to eye daily as needed for allergies.  . Pitavastatin Calcium (LIVALO) 2 MG TABS Take 2 mg by mouth.  . prednisoLONE acetate (PRED FORTE) 1 % ophthalmic suspension Place 1 drop into the left eye as needed (flare ups).   . triamcinolone cream (KENALOG) 0.1 % Apply 1 application topically at bedtime as needed (for rash).     Allergies:   Gadobenate   Social History   Tobacco Use  . Smoking status: Former Smoker    Packs/day: 0.00    Years: 0.00    Pack years: 0.00    Types: Cigarettes    Last attempt to quit: 03/13/2017    Years since quitting: 1.8  . Smokeless tobacco: Never Used  . Tobacco comment: 4  to 5  cigarettes daily for years  Substance Use Topics  . Alcohol use: Yes    Alcohol/week: 1.0 standard drinks    Types: 1 Glasses of wine per week    Comment: occasionaly   . Drug use: No     Family Hx: The patient's family history includes Colon cancer in her maternal uncle; Stroke in her mother.  ROS:   Please see the history of present illness.     Review of Systems  Constitutional: Negative for chills and fever.  Respiratory: Negative.   Cardiovascular: Negative.   Gastrointestinal: Negative for abdominal pain.  Genitourinary: Positive for vaginal discharge (white some thickness). Negative for dysuria and frequency.  Neurological: Negative for dizziness and headaches.    All other systems reviewed and are negative.   Labs/Other Tests and Data Reviewed:    Recent Labs: 09/01/2018: ALT 16; Hemoglobin 12.7; Platelets 323 12/09/2018: BUN 12; Creatinine, Ser 1.23; Potassium 4.3; Sodium 141   Recent Lipid Panel Lab Results  Component Value Date/Time   CHOL 200 (H) 12/09/2018 10:28 AM   TRIG 151 (H) 12/09/2018 10:28 AM   HDL 50 12/09/2018 10:28 AM   CHOLHDL 4.0 12/09/2018 10:28 AM   CHOLHDL 3.1 07/07/2014 08:00 AM   LDLCALC 120 (H) 12/09/2018 10:28 AM    Wt Readings from Last 3 Encounters:  12/09/18 198 lb 3.2 oz (89.9 kg)  11/22/18 199 lb (90.3 kg)  10/27/18 201 lb 6.4 oz (91.4 kg)     Exam:    Vital Signs:  LMP 11/13/2012     Physical Exam Unable to visualize due to telephone visit.  She did not sound as though she was in any acute distress.    ASSESSMENT & PLAN:    1. Vaginal itching  Symptoms are consistent with vaginal yeast infection. Will treat with diflucan.   If not better after treatment return call to office may need urinalysis and physical exam - fluconazole (DIFLUCAN) 100 MG tablet; Take 1 tablet by mouth now then repeat in 5 days  Dispense: 2 tablet; Refill: 0   COVID-19 Education: The signs and symptoms of COVID-19 were discussed with the patient and how to seek care for testing (follow up with PCP or arrange E-visit).  The importance of social distancing was discussed today.  Patient Risk:   After full review of this patients clinical status, I feel that they are at least moderate risk at this time.  Time:   Today, I have spent 11 minutes with the patient with telehealth technology discussing  above diagnoses     Medication Adjustments/Labs and Tests Ordered: Current medicines are reviewed at length with the patient today.  Concerns regarding medicines are outlined above.   Tests Ordered: No orders of the defined types were placed in this encounter.  Medication Changes: Meds ordered this encounter  Medications  . fluconazole (DIFLUCAN) 100 MG tablet    Sig: Take 1 tablet by mouth now then repeat in 5 days    Dispense:  2 tablet    Refill:  0    Disposition:  Follow up prn  Signed, [name], FNP

## 2019-01-28 ENCOUNTER — Ambulatory Visit: Payer: Medicare HMO | Admitting: Adult Health

## 2019-02-07 ENCOUNTER — Other Ambulatory Visit (HOSPITAL_COMMUNITY)
Admission: RE | Admit: 2019-02-07 | Discharge: 2019-02-07 | Disposition: A | Discharge status: Home / Self Care | Payer: Medicare HMO | Source: Ambulatory Visit | Attending: Nurse Practitioner | Admitting: Nurse Practitioner

## 2019-02-07 ENCOUNTER — Encounter: Payer: Self-pay | Admitting: Nurse Practitioner

## 2019-02-07 ENCOUNTER — Ambulatory Visit (INDEPENDENT_AMBULATORY_CARE_PROVIDER_SITE_OTHER): Payer: Medicare HMO | Admitting: Nurse Practitioner

## 2019-02-07 ENCOUNTER — Other Ambulatory Visit: Payer: Self-pay

## 2019-02-07 ENCOUNTER — Other Ambulatory Visit (INDEPENDENT_AMBULATORY_CARE_PROVIDER_SITE_OTHER): Payer: Medicare HMO

## 2019-02-07 VITALS — BP 140/80 | HR 84 | Temp 98.6°F | Ht 67.5 in | Wt 201.2 lb

## 2019-02-07 DIAGNOSIS — N898 Other specified noninflammatory disorders of vagina: Secondary | ICD-10-CM | POA: Diagnosis not present

## 2019-02-07 DIAGNOSIS — N760 Acute vaginitis: Secondary | ICD-10-CM | POA: Diagnosis not present

## 2019-02-07 LAB — POCT URINALYSIS DIPSTICK
Bilirubin, UA: NEGATIVE
Glucose, UA: NEGATIVE
Ketones, UA: NEGATIVE
Nitrite, UA: NEGATIVE
Protein, UA: NEGATIVE
Spec Grav, UA: 1.01 (ref 1.010–1.025)
Urobilinogen, UA: 0.2 E.U./dL
pH, UA: 6.5 (ref 5.0–8.0)

## 2019-02-07 MED ORDER — METRONIDAZOLE 500 MG PO TABS: 500.0000 mg | ORAL_TABLET | Freq: Three times a day (TID) | ORAL | 0 refills | Status: AC

## 2019-02-07 NOTE — Progress Notes (Signed)
Subjective:     Patient ID: Brittany Crawford , female    DOB: Apr 16, 1956 , 63 y.o.   MRN: [id number]   Chief Complaint  Patient presents with  . Vaginitis    HPI  Vaginal Discharge  The patient's primary symptoms include vaginal discharge. This is a recurrent problem. The current episode started 1 to 4 weeks ago. The problem occurs constantly. The problem has been gradually worsening. The patient is experiencing no pain. The problem affects both sides. She is not pregnant. Pertinent negatives include no abdominal pain, chills, dysuria, headaches or sore throat. The vaginal discharge was white. She has not been passing clots. She has not been passing tissue. Nothing aggravates the symptoms. She has tried antifungals for the symptoms. The treatment provided mild relief. She is not sexually active.     Past Medical History:  Diagnosis Date  . Chronic back pain   . H/O sarcoidosis   . Hypertension   . Seasonal allergies   . Stroke [facility])      Family History  Problem Relation Age of Onset  . Colon cancer Maternal Uncle   . Stroke Mother      Current Outpatient Medications:  .  amLODipine (NORVASC) 5 MG tablet, Take 1 tablet (5 mg total) by mouth daily., Disp: 30 tablet, Rfl: 2 .  Ascorbic Acid (VITAMIN C) 1000 MG tablet, Take 1,000 mg by mouth daily., Disp: , Rfl:  .  aspirin 325 MG tablet, Take 1 tablet (325 mg total) by mouth daily., Disp: 30 tablet, Rfl: 3 .  cetirizine (ZYRTEC) 10 MG tablet, Take 10 mg by mouth daily as needed for allergies., Disp: , Rfl:  .  Cholecalciferol (D3 ADULT PO), Take 400 Units by mouth., Disp: , Rfl:  .  diclofenac sodium (VOLTAREN) 1 % GEL, Apply 2 g topically 4 (four) times daily., Disp: 300 g, Rfl: 2 .  hydrochlorothiazide (HYDRODIURIL) 12.5 MG tablet, Take 1 tablet (12.5 mg total) by mouth daily., Disp: 90 tablet, Rfl: 1 .  HYDROcodone-acetaminophen (NORCO) 7.5-325 MG tablet, Take 1 tablet by mouth every 8 (eight) hours as needed for moderate  pain., Disp: 85 tablet, Rfl: 0 .  Hypromellose (ARTIFICIAL TEARS OP), Place 1 drop into the right eye daily as needed (for dry eyes)., Disp: , Rfl:  .  losartan (COZAAR) 100 MG tablet, Take 1 tablet (100 mg total) by mouth daily., Disp: 90 tablet, Rfl: 1 .  Multiple Vitamin (MULTIVITAMIN WITH MINERALS) TABS tablet, Take 1 tablet by mouth daily., Disp: , Rfl:  .  Olopatadine HCl 0.2 % SOLN, Apply 1 drop to eye daily as needed for allergies., Disp: , Rfl: 3 .  Pitavastatin Calcium (LIVALO) 2 MG TABS, Take 2 mg by mouth., Disp: , Rfl:  .  prednisoLONE acetate (PRED FORTE) 1 % ophthalmic suspension, Place 1 drop into the left eye as needed (flare ups). , Disp: , Rfl:  .  triamcinolone cream (KENALOG) 0.1 %, Apply 1 application topically at bedtime as needed (for rash)., Disp: 30 g, Rfl: 0 .  carbamazepine (TEGRETOL) 200 MG tablet, Take 1 tablet (200 mg total) by mouth at bedtime. (Patient not taking: Reported on 02/07/2019), Disp: 30 tablet, Rfl: 3 .  fluconazole (DIFLUCAN) 100 MG tablet, Take 1 tablet by mouth now then repeat in 5 days, Disp: 2 tablet, Rfl: 0   Allergies  Allergen Reactions  . Gadobenate Nausea And Vomiting    Pt was fine after a few minutes , vomiting after contrast injection smills  Review of Systems  Constitutional: Negative for chills.  HENT: Negative for sore throat.   Respiratory: Negative.   Cardiovascular: Negative for chest pain, palpitations and leg swelling.  Gastrointestinal: Negative for abdominal pain.  Endocrine: Negative for polydipsia, polyphagia and polyuria.  Genitourinary: Positive for vaginal discharge. Negative for dysuria.  Neurological: Negative for dizziness and headaches.     Today's Vitals   02/07/19 0942  BP: 140/80  Pulse: 84  Temp: 98.6 F (37 C)  TempSrc: Oral  SpO2: 98%  Weight: 201 lb 3.2 oz (91.3 kg)  Height: 5' 7.5" (1.715 m)   Body mass index is 31.05 kg/m.   Objective:  Physical Exam Vitals signs reviewed.   Constitutional:      Appearance: Normal appearance.  Cardiovascular:     Rate and Rhythm: Normal rate and regular rhythm.     Pulses: Normal pulses.     Heart sounds: Normal heart sounds. No murmur.  Pulmonary:     Effort: Pulmonary effort is normal.     Breath sounds: Normal breath sounds.  Skin:    Capillary Refill: Capillary refill takes less than 2 seconds.  Neurological:     General: No focal deficit present.     Mental Status: She is alert and oriented to person, place, and time.  Psychiatric:        Mood and Affect: Mood normal.        Behavior: Behavior normal.        Thought Content: Thought content normal.        Judgment: Judgment normal.         Assessment And Plan:     1. Vaginal discharge   yellow green vaginal discharge present to vaginal vault  Will treat for possible bacterial vaginosis vs trichomonisis - Cervicovaginal ancillary only - metroNIDAZOLE (FLAGYL) 500 MG tablet; Take 1 tablet (500 mg total) by mouth 3 (three) times daily for 10 days.  Dispense: 30 tablet; Refill: 0    [name], FNP    THE PATIENT IS ENCOURAGED TO PRACTICE SOCIAL DISTANCING DUE TO THE COVID-19 PANDEMIC.

## 2019-02-08 LAB — CERVICOVAGINAL ANCILLARY ONLY
Bacterial vaginitis: POSITIVE — AB
Candida vaginitis: NEGATIVE
Chlamydia: NEGATIVE
Neisseria Gonorrhea: NEGATIVE
Trichomonas: NEGATIVE

## 2019-02-09 ENCOUNTER — Ambulatory Visit: Payer: Medicare HMO | Admitting: Nurse Practitioner

## 2019-02-27 ENCOUNTER — Other Ambulatory Visit: Payer: Self-pay | Admitting: Nurse Practitioner

## 2019-02-27 DIAGNOSIS — I10 Essential (primary) hypertension: Secondary | ICD-10-CM

## 2019-02-28 ENCOUNTER — Telehealth: Payer: Self-pay | Admitting: *Deleted

## 2019-02-28 NOTE — Telephone Encounter (Signed)
Patient called about her refill of hydrocodone. I contacted pharmacy and stated she was eligible for refill today.  Contacted patient and informed Rx is being filled

## 2019-03-06 ENCOUNTER — Other Ambulatory Visit: Payer: Self-pay | Admitting: Nurse Practitioner

## 2019-03-06 DIAGNOSIS — I10 Essential (primary) hypertension: Secondary | ICD-10-CM

## 2019-03-08 ENCOUNTER — Other Ambulatory Visit: Payer: Self-pay | Admitting: Nurse Practitioner

## 2019-03-08 DIAGNOSIS — I10 Essential (primary) hypertension: Secondary | ICD-10-CM

## 2019-03-10 ENCOUNTER — Telehealth: Payer: Self-pay

## 2019-03-10 ENCOUNTER — Other Ambulatory Visit: Payer: Self-pay

## 2019-03-10 DIAGNOSIS — I10 Essential (primary) hypertension: Secondary | ICD-10-CM

## 2019-03-10 MED ORDER — LOSARTAN POTASSIUM 100 MG PO TABS: ORAL_TABLET | ORAL | 1 refills | Status: DC

## 2019-03-10 MED ORDER — HYDROCHLOROTHIAZIDE 12.5 MG PO TABS: ORAL_TABLET | ORAL | 1 refills | Status: DC

## 2019-03-10 NOTE — Telephone Encounter (Signed)
Called pt pharmacy to see why pt is unable to pick up her prescriptions and they stated they dont have the losartan by itself but they do have the combination pill. I gave verbal ok for the combination pill also pt has been notified YRL,RMA

## 2019-03-14 ENCOUNTER — Telehealth: Payer: Self-pay

## 2019-03-14 NOTE — Telephone Encounter (Signed)
Patient called stating she lost her pill bottle of HCTZ and I advised pt to look at the name of the bottle that she just got refilled to make sure she received the correct medication because we sent her a Rx for Losartan HCTZ instead of her taking them separately because the plain losartan is on backorder. Patient stated she has the Losartan HCTZ. YRL,RMA

## 2019-03-24 ENCOUNTER — Encounter: Payer: Medicare HMO | Attending: Registered Nurse | Admitting: Registered Nurse

## 2019-03-24 ENCOUNTER — Encounter: Payer: Self-pay | Admitting: Registered Nurse

## 2019-03-24 ENCOUNTER — Other Ambulatory Visit: Payer: Self-pay

## 2019-03-24 ENCOUNTER — Ambulatory Visit: Payer: Medicare HMO | Admitting: Registered Nurse

## 2019-03-24 VITALS — BP 130/84 | HR 70 | Temp 97.7°F | Ht 67.5 in | Wt 199.0 lb

## 2019-03-24 DIAGNOSIS — M5136 Other intervertebral disc degeneration, lumbar region: Secondary | ICD-10-CM | POA: Diagnosis not present

## 2019-03-24 DIAGNOSIS — G894 Chronic pain syndrome: Secondary | ICD-10-CM | POA: Diagnosis not present

## 2019-03-24 DIAGNOSIS — Z800 Family history of malignant neoplasm of digestive organs: Secondary | ICD-10-CM | POA: Diagnosis not present

## 2019-03-24 DIAGNOSIS — M47816 Spondylosis without myelopathy or radiculopathy, lumbar region: Secondary | ICD-10-CM | POA: Insufficient documentation

## 2019-03-24 DIAGNOSIS — Z79899 Other long term (current) drug therapy: Secondary | ICD-10-CM | POA: Diagnosis not present

## 2019-03-24 DIAGNOSIS — M129 Arthropathy, unspecified: Secondary | ICD-10-CM | POA: Insufficient documentation

## 2019-03-24 DIAGNOSIS — Z760 Encounter for issue of repeat prescription: Secondary | ICD-10-CM | POA: Diagnosis not present

## 2019-03-24 DIAGNOSIS — M7061 Trochanteric bursitis, right hip: Secondary | ICD-10-CM | POA: Diagnosis not present

## 2019-03-24 DIAGNOSIS — M25561 Pain in right knee: Secondary | ICD-10-CM | POA: Insufficient documentation

## 2019-03-24 DIAGNOSIS — Z79891 Long term (current) use of opiate analgesic: Secondary | ICD-10-CM | POA: Diagnosis not present

## 2019-03-24 DIAGNOSIS — G8929 Other chronic pain: Secondary | ICD-10-CM

## 2019-03-24 DIAGNOSIS — M7062 Trochanteric bursitis, left hip: Secondary | ICD-10-CM | POA: Diagnosis not present

## 2019-03-24 DIAGNOSIS — Z87891 Personal history of nicotine dependence: Secondary | ICD-10-CM | POA: Insufficient documentation

## 2019-03-24 DIAGNOSIS — Z5181 Encounter for therapeutic drug level monitoring: Secondary | ICD-10-CM

## 2019-03-24 DIAGNOSIS — M25562 Pain in left knee: Secondary | ICD-10-CM | POA: Diagnosis not present

## 2019-03-24 DIAGNOSIS — I10 Essential (primary) hypertension: Secondary | ICD-10-CM | POA: Diagnosis not present

## 2019-03-24 DIAGNOSIS — M546 Pain in thoracic spine: Secondary | ICD-10-CM

## 2019-03-24 MED ORDER — HYDROCODONE-ACETAMINOPHEN 7.5-325 MG PO TABS: 1.0000 | ORAL_TABLET | Freq: Three times a day (TID) | ORAL | 0 refills | Status: DC | PRN

## 2019-03-24 NOTE — Progress Notes (Signed)
Subjective:    Patient ID: Brittany Crawford, female    DOB: 08/13/1956, 63 y.o.   MRN: [id number]  HPI: Brittany Crawford is a 63 y.o. female who returns for follow up appointment for chronic pain and medication refill. She states her pain is located in her mid- lower back pain. Also reports occasional left eye pain, denies at this time. Instructed to follow up with her ophthalmologist, she verbalizes understanding. She rates her pain 6. Her current exercise regime is walking and performing stretching exercises.  Ms. Garant Morphine equivalent is 22.77MME.  Last UDS was Performed on 09/24/2018, it was consistent. UDS performed today.   Pain Inventory Average Pain 6 Pain Right Now 6 My pain is na  In the last 24 hours, has pain interfered with the following? General activity 7 Relation with others 9 Enjoyment of life 9 What TIME of day is your pain at its worst? morning and night Sleep (in general) NA  Pain is worse with: bending and standing Pain improves with: rest, heat/ice, therapy/exercise and medication Relief from Meds: 9  Mobility use a cane ability to climb steps?  yes do you drive?  no  Function disabled: date disabled . I need assistance with the following:  household duties and shopping  Neuro/Psych No problems in this area  Prior Studies Any changes since last visit?  no  Physicians involved in your care Any changes since last visit?  no   Family History  Problem Relation Age of Onset  . Colon cancer Maternal Uncle   . Stroke Mother    Social History   Socioeconomic History  . Marital status: Married    Spouse name: Not on file  . Number of children: 2  . Years of education: 65  . Highest education level: Not on file  Occupational History  . Not on file  Social Needs  . Financial resource strain: Not on file  . Food insecurity    Worry: Not on file    Inability: Not on file  . Transportation needs    Medical: Not on file   Non-medical: Not on file  Tobacco Use  . Smoking status: Former Smoker    Packs/day: 0.00    Years: 0.00    Pack years: 0.00    Types: Cigarettes    Quit date: 03/13/2017    Years since quitting: 2.0  . Smokeless tobacco: Never Used  . Tobacco comment: 4  to 5  cigarettes daily for years  Substance and Sexual Activity  . Alcohol use: Yes    Alcohol/week: 1.0 standard drinks    Types: 1 Glasses of wine per week    Comment: occasionaly   . Drug use: No  . Sexual activity: Yes  Lifestyle  . Physical activity    Days per week: Not on file    Minutes per session: Not on file  . Stress: Not on file  Relationships  . Social Herbalist on phone: Not on file    Gets together: Not on file    Attends religious service: Not on file    Active member of club or organization: Not on file    Attends meetings of clubs or organizations: Not on file    Relationship status: Not on file  Other Topics Concern  . Not on file  Social History Narrative   Patient is married with 2 children.   Patient is right handed.   Patient has hs education.  Patient drinks 4 cups daily.   Past Surgical History:  Procedure Laterality Date  . BREAST BIOPSY Left 04/29/2016  . BREAST BIOPSY Left 04/23/2016  . DILATATION & CURRETTAGE/HYSTEROSCOPY WITH RESECTOCOPE N/A 12/10/2012   Procedure: DILATATION & CURETTAGE/HYSTEROSCOPY WITH RESECTOCOPE;  Surgeon: Marvene Staff, MD;  Location: [facility];  Service: Gynecology;  Laterality: N/A;  . FOOT SURGERY     left-pins placed  . LYMPH NODE BIOPSY    . POLYPECTOMY N/A 12/10/2012   Procedure: POLYPECTOMY;  Surgeon: Marvene Staff, MD;  Location: [facility];  Service: Gynecology;  Laterality: N/A;  . STRABISMUS SURGERY Left 04/17/2017   Procedure: REPAIR STRABISMUS LEFT EYE;  Surgeon: Everitt Amber, MD;  Location: [facility];  Service: Ophthalmology;  Laterality: Left;   Past Medical History:  Diagnosis Date  . Chronic back pain   . H/O  sarcoidosis   . Hypertension   . Seasonal allergies   . Stroke ([place])    BP 130/84   Pulse 70   Temp 97.7 F (36.5 C)   Ht 5' 7.5" (1.715 m)   Wt 199 lb (90.3 kg)   LMP 11/13/2012   SpO2 94%   BMI 30.71 kg/m   Opioid Risk Score:   Fall Risk Score:  `1  Depression screen PHQ 2/9  Depression screen [facility] 2/9 03/24/2019 02/07/2019 01/26/2019 01/25/2019 09/24/2018 09/15/2018 09/01/2018  Decreased Interest 0 0 0 0 0 0 0  Down, Depressed, Hopeless 0 0 0 0 0 0 0  PHQ - 2 Score 0 0 0 0 0 0 0  Altered sleeping - - - - - - -  Tired, decreased energy - - - - - - -  Change in appetite - - - - - - -  Feeling bad or failure about yourself  - - - - - - -  Trouble concentrating - - - - - - -  Moving slowly or fidgety/restless - - - - - - -  Suicidal thoughts - - - - - - -  PHQ-9 Score - - - - - - -    Review of Systems  Constitutional: Negative.   HENT: Negative.   Eyes: Negative.   Respiratory: Negative.   Cardiovascular: Negative.   Gastrointestinal: Negative.   Endocrine: Negative.   Genitourinary: Negative.   Musculoskeletal: Negative.   Skin: Negative.   Allergic/Immunologic: Negative.   Neurological: Negative.   Hematological: Negative.   Psychiatric/Behavioral: Negative.   All other systems reviewed and are negative.      Objective:   Physical Exam Vitals signs and nursing note reviewed.  Constitutional:      Appearance: Normal appearance.  Neck:     Musculoskeletal: Normal range of motion and neck supple.  Cardiovascular:     Rate and Rhythm: Normal rate and regular rhythm.     Pulses: Normal pulses.     Heart sounds: Normal heart sounds.  Pulmonary:     Effort: Pulmonary effort is normal.     Breath sounds: Normal breath sounds.  Musculoskeletal:     Comments: Normal Muscle Bulk and Muscle Testing Reveals:  Upper Extremities: Full ROM and Muscle Strength 5/5 Thoracic Paraspinal Tenderness: T-7-T-9 Lumbar Paraspinal Tenderness: L-4-L-5 Lower Extremities: Full  ROM and Muscle Strength 5/5 Arises from Table with ease Narrow Based Gait   Skin:    General: Skin is warm and dry.  Neurological:     Mental Status: She is alert and oriented to person, place, and time.  Psychiatric:  Mood and Affect: Mood normal.        Behavior: Behavior normal.           Assessment & Plan:  1. Lumbar spondylosis with DDD and facet arthropathy.06//08/2019 Refilled:Hydrocodone 7.5 /325 mg one tablet every 8 hours as needed for pain #85.Second scripte-scribefor the following month. We will continue the opioid monitoring program, this consists of regular clinic visits, examinations, urine drug screen, pill counts as well as use of [place] Controlled Substance Reporting System. 2. Bilateral knee pain: No complaints voiced today:Continue with heat/ice, exercise and Diclofenac.03/24/2019 3. Right thalamic/internal capsule lacunar infarct with persistent left hemisensory deficits: Continue to Monitor.03/24/2019 4. Bilateral Greater Trochanteric Bursitis:No complaints Today.Continue current medication regime.03/24/2019  20 minutes of face to face patient care time was spent during this visit. All questions were encouraged and answered.   F/U in 2 months

## 2019-03-27 LAB — TOXASSURE SELECT,+ANTIDEPR,UR

## 2019-03-28 ENCOUNTER — Telehealth: Payer: Self-pay | Admitting: *Deleted

## 2019-03-28 NOTE — Telephone Encounter (Signed)
Urine drug screen for this encounter is consistent for prescribed medication 

## 2019-04-07 ENCOUNTER — Other Ambulatory Visit: Payer: Self-pay | Admitting: Nurse Practitioner

## 2019-04-07 DIAGNOSIS — Z1231 Encounter for screening mammogram for malignant neoplasm of breast: Secondary | ICD-10-CM

## 2019-04-12 ENCOUNTER — Other Ambulatory Visit: Payer: Self-pay

## 2019-04-13 ENCOUNTER — Ambulatory Visit (INDEPENDENT_AMBULATORY_CARE_PROVIDER_SITE_OTHER): Payer: Medicare HMO | Admitting: Nurse Practitioner

## 2019-04-13 ENCOUNTER — Encounter: Payer: Self-pay | Admitting: Nurse Practitioner

## 2019-04-13 ENCOUNTER — Ambulatory Visit (INDEPENDENT_AMBULATORY_CARE_PROVIDER_SITE_OTHER): Payer: Medicare HMO

## 2019-04-13 VITALS — BP 124/76 | HR 72 | Temp 98.9°F | Ht 67.8 in | Wt 200.4 lb

## 2019-04-13 VITALS — BP 124/76 | HR 72 | Ht 67.8 in | Wt 200.4 lb

## 2019-04-13 DIAGNOSIS — R8290 Unspecified abnormal findings in urine: Secondary | ICD-10-CM

## 2019-04-13 DIAGNOSIS — N183 Chronic kidney disease, stage 3 unspecified: Secondary | ICD-10-CM

## 2019-04-13 DIAGNOSIS — Z0000 Encounter for general adult medical examination without abnormal findings: Secondary | ICD-10-CM | POA: Diagnosis not present

## 2019-04-13 DIAGNOSIS — E782 Mixed hyperlipidemia: Secondary | ICD-10-CM | POA: Diagnosis not present

## 2019-04-13 DIAGNOSIS — I129 Hypertensive chronic kidney disease with stage 1 through stage 4 chronic kidney disease, or unspecified chronic kidney disease: Secondary | ICD-10-CM | POA: Diagnosis not present

## 2019-04-13 DIAGNOSIS — I10 Essential (primary) hypertension: Secondary | ICD-10-CM

## 2019-04-13 LAB — POCT URINALYSIS DIPSTICK
Bilirubin, UA: NEGATIVE
Glucose, UA: NEGATIVE
Ketones, UA: NEGATIVE
Nitrite, UA: NEGATIVE
Protein, UA: NEGATIVE
Spec Grav, UA: 1.015 (ref 1.010–1.025)
Urobilinogen, UA: 0.2 E.U./dL
pH, UA: 7 (ref 5.0–8.0)

## 2019-04-13 NOTE — Patient Instructions (Signed)
Chronic Kidney Disease, Adult °Chronic kidney disease (CKD) happens when the kidneys are damaged over a long period of time. The kidneys are two organs that help with: °· Getting rid of waste and extra fluid from the blood. °· Making hormones that maintain the amount of fluid in your tissues and blood vessels. °· Making sure that the body has the right amount of fluids and chemicals. °Most of the time, CKD does not go away, but it can usually be controlled. Steps must be taken to slow down the kidney damage or to stop it from getting worse. If this is not done, the kidneys may stop working. °Follow these instructions at home: °Medicines °· Take over-the-counter and prescription medicines only as told by your doctor. You may need to change the amount of medicines you take. °· Do not take any new medicines unless your doctor says it is okay. Many medicines can make your kidney damage worse. °· Do not take any vitamin and supplements unless your doctor says it is okay. Many vitamins and supplements can make your kidney damage worse. °General instructions °· Follow a diet as told by your doctor. You may need to stay away from: °? Alcohol. °? Salty foods. °? Foods that are high in: °§ Potassium. °§ Calcium. °§ Protein. °· Do not use any products that contain nicotine or tobacco, such as cigarettes and e-cigarettes. If you need help quitting, ask your doctor. °· Keep track of your blood pressure at home. Tell your doctor about any changes. °· If you have diabetes, keep track of your blood sugar as told by your doctor. °· Try to stay at a healthy weight. If you need help, ask your doctor. °· Exercise at least 30 minutes a day, 5 days a week. °· Stay up-to-date with your shots (immunizations) as told by your doctor. °· Keep all follow-up visits as told by your doctor. This is important. °Contact a doctor if: °· Your symptoms get worse. °· You have new symptoms. °Get help right away if: °· You have symptoms of end-stage  kidney disease. These may include: °? Headaches. °? Numbness in your hands or feet. °? Easy bruising. °? Having hiccups often. °? Chest pain. °? Shortness of breath. °? Stopping of menstrual periods in women. °· You have a fever. °· You have very little pee (urine). °· You have pain or bleeding when you pee. °Summary °· Chronic kidney disease (CKD) happens when the kidneys are damaged over a long period of time. °· Most of the time, this condition does not go away, but it can usually be controlled. Steps must be taken to slow down the kidney damage or to stop it from getting worse. °· Treatment may include a combination of medicines and lifestyle changes. °This information is not intended to replace advice given to you by your health care provider. Make sure you discuss any questions you have with your health care provider. °Document Released: 12/24/2009 Document Revised: 09/11/2017 Document Reviewed: 11/03/2016 °Elsevier Patient Education © 2020 Elsevier Inc. ° °

## 2019-04-13 NOTE — Progress Notes (Signed)
Subjective:     Patient ID: Brittany Crawford , female    DOB: 01-19-1956 , 63 y.o.   MRN: [id number]   Chief Complaint  Patient presents with  . Hypertension    HPI  Hypertension This is a chronic problem. The current episode started more than 1 year ago. The problem is unchanged. The problem is controlled. Pertinent negatives include no anxiety, chest pain, headaches or palpitations. There are no associated agents to hypertension. There are no known risk factors for coronary artery disease. Past treatments include calcium channel blockers and angiotensin blockers. The current treatment provides no improvement. There are no compliance problems.  Hypertensive end-organ damage includes kidney disease and CVA. There is no history of angina. Identifiable causes of hypertension include chronic renal disease.     Past Medical History:  Diagnosis Date  . Chronic back pain   . H/O sarcoidosis   . Hypertension   . Seasonal allergies   . Stroke [facility])      Family History  Problem Relation Age of Onset  . Colon cancer Maternal Uncle   . Stroke Mother      Current Outpatient Medications:  .  Ascorbic Acid (VITAMIN C) 1000 MG tablet, Take 1,000 mg by mouth daily. Takes when coming down with a cold., Disp: , Rfl:  .  aspirin 325 MG tablet, Take 1 tablet (325 mg total) by mouth daily. (Patient taking differently: Take 81 mg by mouth daily. ), Disp: 30 tablet, Rfl: 3 .  cetirizine (ZYRTEC) 10 MG tablet, Take 10 mg by mouth daily as needed for allergies., Disp: , Rfl:  .  Cholecalciferol (D3 ADULT PO), Take 400 Units by mouth., Disp: , Rfl:  .  diclofenac sodium (VOLTAREN) 1 % GEL, Apply 2 g topically 4 (four) times daily., Disp: 300 g, Rfl: 2 .  HYDROcodone-acetaminophen (NORCO) 7.5-325 MG tablet, Take 1 tablet by mouth every 8 (eight) hours as needed for moderate pain., Disp: 85 tablet, Rfl: 0 .  Hypromellose (ARTIFICIAL TEARS OP), Place 1 drop into the right eye daily as needed (for dry  eyes)., Disp: , Rfl:  .  losartan-hydrochlorothiazide (HYZAAR) 100-12.5 MG tablet, Take 1 tablet by mouth daily., Disp: , Rfl:  .  Multiple Vitamin (MULTIVITAMIN WITH MINERALS) TABS tablet, Take 1 tablet by mouth daily., Disp: , Rfl:  .  Olopatadine HCl 0.2 % SOLN, Apply 1 drop to eye daily as needed for allergies., Disp: , Rfl: 3 .  Pitavastatin Calcium (LIVALO) 2 MG TABS, Take 2 mg by mouth., Disp: , Rfl:  .  prednisoLONE acetate (PRED FORTE) 1 % ophthalmic suspension, Place 1 drop into the left eye as needed (flare ups). , Disp: , Rfl:  .  triamcinolone cream (KENALOG) 0.1 %, Apply 1 application topically at bedtime as needed (for rash). (Patient not taking: Reported on 04/13/2019), Disp: 30 g, Rfl: 0   Allergies  Allergen Reactions  . Gadobenate Nausea And Vomiting    Pt was fine after a few minutes , vomiting after contrast injection smills      Review of Systems  Constitutional: Negative.   Respiratory: Negative.   Cardiovascular: Negative for chest pain, palpitations and leg swelling.  Endocrine: Negative for polydipsia, polyphagia and polyuria.  Skin: Negative.   Neurological: Negative for dizziness and headaches.     Today's Vitals   04/13/19 0924  BP: 124/76  Pulse: 72  SpO2: 95%  Weight: 200 lb 6.4 oz (90.9 kg)  Height: 5' 7.8" (1.722 m)   Body  mass index is 30.65 kg/m.   Objective:  Physical Exam Vitals signs reviewed.  Constitutional:      Appearance: Normal appearance.  Cardiovascular:     Rate and Rhythm: Normal rate and regular rhythm.     Pulses: Normal pulses.     Heart sounds: No murmur.  Pulmonary:     Effort: Pulmonary effort is normal.     Breath sounds: Normal breath sounds.  Skin:    General: Skin is warm and dry.     Capillary Refill: Capillary refill takes less than 2 seconds.  Neurological:     General: No focal deficit present.     Mental Status: She is alert and oriented to person, place, and time.  Psychiatric:        Mood and Affect:  Mood normal.        Behavior: Behavior normal.        Thought Content: Thought content normal.        Judgment: Judgment normal.         Assessment And Plan:     1. Essential hypertension, benign . B/P is controlled.  . CMP ordered to check renal function.  . The importance of regular exercise and dietary modification was stressed to the patient.  - BMP8+eGFR  2. Mixed hyperlipidemia  Chronic, controlled  No current medications  3. Stage 3 chronic kidney disease ([place])  Discussed avoiding nephrotoxic medications  Also encouraged to limit her intake of coffee as this may cause her natural diuresis  Encouraged to drink adequate amounts of water - Parathyroid Hormone, Intact w/Ca  4. Serum calcium elevated  Elevated calcium will check s pep - Protein electrophoresis, serum   [name], FNP    THE PATIENT IS ENCOURAGED TO PRACTICE SOCIAL DISTANCING DUE TO THE COVID-19 PANDEMIC.

## 2019-04-13 NOTE — Patient Instructions (Signed)
[name] , Thank you for taking time to come for your Medicare Wellness Visit. I appreciate your ongoing commitment to your health goals. Please review the following plan we discussed and let me know if I can assist you in the future.   Screening recommendations/referrals: Colonoscopy: 06/2012 Mammogram: 03/2018 Bone Density: n/a Recommended yearly ophthalmology/optometry visit for glaucoma screening and checkup Recommended yearly dental visit for hygiene and checkup  Vaccinations: Influenza vaccine: 07/2018 Pneumococcal vaccine: 06/2014 Tdap vaccine: decline Shingles vaccine: discussed    Advanced directives: Advance directive discussed with you today. Even though you declined this today please call our office should you change your mind and we can give you the proper paperwork for you to fill out.   Conditions/risks identified: obesity  Next appointment: 04/13/2019  Preventive Care 40-64 Years, Female Preventive care refers to lifestyle choices and visits with your health care provider that can promote health and wellness. What does preventive care include?  A yearly physical exam. This is also called an annual well check.  Dental exams once or twice a year.  Routine eye exams. Ask your health care provider how often you should have your eyes checked.  Personal lifestyle choices, including:  Daily care of your teeth and gums.  Regular physical activity.  Eating a healthy diet.  Avoiding tobacco and drug use.  Limiting alcohol use.  Practicing safe sex.  Taking low-dose aspirin daily starting at age 63.  Taking vitamin and mineral supplements as recommended by your health care provider. What happens during an annual well check? The services and screenings done by your health care provider during your annual well check will depend on your age, overall health, lifestyle risk factors, and family history of disease. Counseling  Your health care provider may ask you  questions about your:  Alcohol use.  Tobacco use.  Drug use.  Emotional well-being.  Home and relationship well-being.  Sexual activity.  Eating habits.  Work and work Statistician.  Method of birth control.  Menstrual cycle.  Pregnancy history. Screening  You may have the following tests or measurements:  Height, weight, and BMI.  Blood pressure.  Lipid and cholesterol levels. These may be checked every 5 years, or more frequently if you are over 31 years old.  Skin check.  Lung cancer screening. You may have this screening every year starting at age 50 if you have a 30-pack-year history of smoking and currently smoke or have quit within the past 15 years.  Fecal occult blood test (FOBT) of the stool. You may have this test every year starting at age 59.  Flexible sigmoidoscopy or colonoscopy. You may have a sigmoidoscopy every 5 years or a colonoscopy every 10 years starting at age 17.  Hepatitis C blood test.  Hepatitis B blood test.  Sexually transmitted disease (STD) testing.  Diabetes screening. This is done by checking your blood sugar (glucose) after you have not eaten for a while (fasting). You may have this done every 1-3 years.  Mammogram. This may be done every 1-2 years. Talk to your health care provider about when you should start having regular mammograms. This may depend on whether you have a family history of breast cancer.  BRCA-related cancer screening. This may be done if you have a family history of breast, ovarian, tubal, or peritoneal cancers.  Pelvic exam and Pap test. This may be done every 3 years starting at age 67. Starting at age 62, this may be done every 5 years if you  have a Pap test in combination with an HPV test.  Bone density scan. This is done to screen for osteoporosis. You may have this scan if you are at high risk for osteoporosis. Discuss your test results, treatment options, and if necessary, the need for more tests with  your health care provider. Vaccines  Your health care provider may recommend certain vaccines, such as:  Influenza vaccine. This is recommended every year.  Tetanus, diphtheria, and acellular pertussis (Tdap, Td) vaccine. You may need a Td booster every 10 years.  Zoster vaccine. You may need this after age 5.  Pneumococcal 13-valent conjugate (PCV13) vaccine. You may need this if you have certain conditions and were not previously vaccinated.  Pneumococcal polysaccharide (PPSV23) vaccine. You may need one or two doses if you smoke cigarettes or if you have certain conditions. Talk to your health care provider about which screenings and vaccines you need and how often you need them. This information is not intended to replace advice given to you by your health care provider. Make sure you discuss any questions you have with your health care provider. Document Released: 10/26/2015 Document Revised: 06/18/2016 Document Reviewed: 07/31/2015 Elsevier Interactive Patient Education  2017 [place] Prevention in the Home Falls can cause injuries. They can happen to people of all ages. There are many things you can do to make your home safe and to help prevent falls. What can I do on the outside of my home?  Regularly fix the edges of walkways and driveways and fix any cracks.  Remove anything that might make you trip as you walk through a door, such as a raised step or threshold.  Trim any bushes or trees on the path to your home.  Use bright outdoor lighting.  Clear any walking paths of anything that might make someone trip, such as rocks or tools.  Regularly check to see if handrails are loose or broken. Make sure that both sides of any steps have handrails.  Any raised decks and porches should have guardrails on the edges.  Have any leaves, snow, or ice cleared regularly.  Use sand or salt on walking paths during winter.  Clean up any spills in your garage right  away. This includes oil or grease spills. What can I do in the bathroom?  Use night lights.  Install grab bars by the toilet and in the tub and shower. Do not use towel bars as grab bars.  Use non-skid mats or decals in the tub or shower.  If you need to sit down in the shower, use a plastic, non-slip stool.  Keep the floor dry. Clean up any water that spills on the floor as soon as it happens.  Remove soap buildup in the tub or shower regularly.  Attach bath mats securely with double-sided non-slip rug tape.  Do not have throw rugs and other things on the floor that can make you trip. What can I do in the bedroom?  Use night lights.  Make sure that you have a light by your bed that is easy to reach.  Do not use any sheets or blankets that are too big for your bed. They should not hang down onto the floor.  Have a firm chair that has side arms. You can use this for support while you get dressed.  Do not have throw rugs and other things on the floor that can make you trip. What can I do in the kitchen?  Clean up any spills right away.  Avoid walking on wet floors.  Keep items that you use a lot in easy-to-reach places.  If you need to reach something above you, use a strong step stool that has a grab bar.  Keep electrical cords out of the way.  Do not use floor polish or wax that makes floors slippery. If you must use wax, use non-skid floor wax.  Do not have throw rugs and other things on the floor that can make you trip. What can I do with my stairs?  Do not leave any items on the stairs.  Make sure that there are handrails on both sides of the stairs and use them. Fix handrails that are broken or loose. Make sure that handrails are as long as the stairways.  Check any carpeting to make sure that it is firmly attached to the stairs. Fix any carpet that is loose or worn.  Avoid having throw rugs at the top or bottom of the stairs. If you do have throw rugs, attach  them to the floor with carpet tape.  Make sure that you have a light switch at the top of the stairs and the bottom of the stairs. If you do not have them, ask someone to add them for you. What else can I do to help prevent falls?  Wear shoes that:  Do not have high heels.  Have rubber bottoms.  Are comfortable and fit you well.  Are closed at the toe. Do not wear sandals.  If you use a stepladder:  Make sure that it is fully opened. Do not climb a closed stepladder.  Make sure that both sides of the stepladder are locked into place.  Ask someone to hold it for you, if possible.  Clearly mark and make sure that you can see:  Any grab bars or handrails.  First and last steps.  Where the edge of each step is.  Use tools that help you move around (mobility aids) if they are needed. These include:  Canes.  Walkers.  Scooters.  Crutches.  Turn on the lights when you go into a dark area. Replace any light bulbs as soon as they burn out.  Set up your furniture so you have a clear path. Avoid moving your furniture around.  If any of your floors are uneven, fix them.  If there are any pets around you, be aware of where they are.  Review your medicines with your doctor. Some medicines can make you feel dizzy. This can increase your chance of falling. Ask your doctor what other things that you can do to help prevent falls. This information is not intended to replace advice given to you by your health care provider. Make sure you discuss any questions you have with your health care provider. Document Released: 07/26/2009 Document Revised: 03/06/2016 Document Reviewed: 11/03/2014 Elsevier Interactive Patient Education  2017 [employer].

## 2019-04-13 NOTE — Progress Notes (Signed)
Subjective:   Brittany Crawford is a 63 y.o. female who presents for Medicare Annual (Subsequent) preventive examination.  Review of Systems:  n/a Cardiac Risk Factors include: dyslipidemia;hypertension;obesity (BMI >30kg/m2);sedentary lifestyle     Objective:     Vitals: BP 124/76 (BP Location: Left Arm, Patient Position: Sitting, Cuff Size: Normal)   Pulse 72   Temp 98.9 F (37.2 C) (Oral)   Ht 5' 7.8" (1.722 m)   Wt 200 lb 6.4 oz (90.9 kg)   LMP 11/13/2012   SpO2 95%   BMI 30.65 kg/m   Body mass index is 30.65 kg/m.  Advanced Directives 04/13/2019 07/29/2017 06/10/2017 04/17/2017 04/10/2017 02/11/2017 10/15/2016  Does Patient Have a Medical Advance Directive? No No No No No No Yes  Does patient want to make changes to medical advance directive? - - - - - Yes (MAU/Ambulatory/Procedural Areas - Information given) -  Would patient like information on creating a medical advance directive? No - Patient declined - - No - Patient declined - - -  Pre-existing out of facility DNR order (yellow form or pink MOST form) - - - - - - -    Tobacco Social History   Tobacco Use  Smoking Status Former Smoker  . Packs/day: 0.00  . Years: 0.00  . Pack years: 0.00  . Types: Cigarettes  . Quit date: 03/13/2017  . Years since quitting: 2.0  Smokeless Tobacco Never Used  Tobacco Comment   4  to 5  cigarettes daily for years     Counseling given: Not Answered Comment: 4  to 5  cigarettes daily for years   Clinical Intake:  Pre-visit preparation completed: Yes  Pain : No/denies pain     Nutritional Status: BMI > 30  Obese Nutritional Risks: None Diabetes: No  How often do you need to have someone help you when you read instructions, pamphlets, or other written materials from your doctor or pharmacy?: 1 - Never What is the last grade level you completed in school?: 12th grade  Interpreter Needed?: No  Information entered by :: NAllen LPN  Past Medical History:  Diagnosis Date  .  Chronic back pain   . H/O sarcoidosis   . Hypertension   . Seasonal allergies   . Stroke [facility])    Past Surgical History:  Procedure Laterality Date  . BREAST BIOPSY Left 04/29/2016  . BREAST BIOPSY Left 04/23/2016  . DILATATION & CURRETTAGE/HYSTEROSCOPY WITH RESECTOCOPE N/A 12/10/2012   Procedure: DILATATION & CURETTAGE/HYSTEROSCOPY WITH RESECTOCOPE;  Surgeon: Marvene Staff, MD;  Location: [facility];  Service: Gynecology;  Laterality: N/A;  . FOOT SURGERY     left-pins placed  . LYMPH NODE BIOPSY    . POLYPECTOMY N/A 12/10/2012   Procedure: POLYPECTOMY;  Surgeon: Marvene Staff, MD;  Location: [facility];  Service: Gynecology;  Laterality: N/A;  . STRABISMUS SURGERY Left 04/17/2017   Procedure: REPAIR STRABISMUS LEFT EYE;  Surgeon: Everitt Amber, MD;  Location: [facility];  Service: Ophthalmology;  Laterality: Left;   Family History  Problem Relation Age of Onset  . Colon cancer Maternal Uncle   . Stroke Mother    Social History   Socioeconomic History  . Marital status: Married    Spouse name: Not on file  . Number of children: 2  . Years of education: 57  . Highest education level: Not on file  Occupational History  . Occupation: disability  Social Needs  . Financial resource strain: Not hard at all  .  Food insecurity    Worry: Never true    Inability: Never true  . Transportation needs    Medical: No    Non-medical: No  Tobacco Use  . Smoking status: Former Smoker    Packs/day: 0.00    Years: 0.00    Pack years: 0.00    Types: Cigarettes    Quit date: 03/13/2017    Years since quitting: 2.0  . Smokeless tobacco: Never Used  . Tobacco comment: 4  to 5  cigarettes daily for years  Substance and Sexual Activity  . Alcohol use: Not Currently    Comment: occassionally  . Drug use: Yes    Types: Hydrocodone  . Sexual activity: Yes  Lifestyle  . Physical activity    Days per week: 2 days    Minutes per session: 10 min  . Stress: Not at all   Relationships  . Social Herbalist on phone: Not on file    Gets together: Not on file    Attends religious service: Not on file    Active member of club or organization: Not on file    Attends meetings of clubs or organizations: Not on file    Relationship status: Not on file  Other Topics Concern  . Not on file  Social History Narrative   Patient is married with 2 children.   Patient is right handed.   Patient has hs education.   Patient drinks 4 cups daily.    Outpatient Encounter Medications as of 04/13/2019  Medication Sig  . Ascorbic Acid (VITAMIN C) 1000 MG tablet Take 1,000 mg by mouth daily. Takes when coming down with a cold.  Marland Kitchen aspirin 325 MG tablet Take 1 tablet (325 mg total) by mouth daily. (Patient taking differently: Take 81 mg by mouth daily. )  . cetirizine (ZYRTEC) 10 MG tablet Take 10 mg by mouth daily as needed for allergies.  . Cholecalciferol (D3 ADULT PO) Take 400 Units by mouth.  . diclofenac sodium (VOLTAREN) 1 % GEL Apply 2 g topically 4 (four) times daily.  Marland Kitchen HYDROcodone-acetaminophen (NORCO) 7.5-325 MG tablet Take 1 tablet by mouth every 8 (eight) hours as needed for moderate pain.  . Hypromellose (ARTIFICIAL TEARS OP) Place 1 drop into the right eye daily as needed (for dry eyes).  Marland Kitchen losartan-hydrochlorothiazide (HYZAAR) 100-12.5 MG tablet Take 1 tablet by mouth daily.  . Multiple Vitamin (MULTIVITAMIN WITH MINERALS) TABS tablet Take 1 tablet by mouth daily.  . Olopatadine HCl 0.2 % SOLN Apply 1 drop to eye daily as needed for allergies.  . prednisoLONE acetate (PRED FORTE) 1 % ophthalmic suspension Place 1 drop into the left eye as needed (flare ups).   . Pitavastatin Calcium (LIVALO) 2 MG TABS Take 2 mg by mouth.  . triamcinolone cream (KENALOG) 0.1 % Apply 1 application topically at bedtime as needed (for rash). (Patient not taking: Reported on 04/13/2019)   No facility-administered encounter medications on file as of 04/13/2019.      Activities of Daily Living In your present state of health, do you have any difficulty performing the following activities: 04/13/2019  Hearing? N  Vision? Y  Comment no left eye vision  Difficulty concentrating or making decisions? N  Walking or climbing stairs? Y  Comment sometimes  Dressing or bathing? N  Doing errands, shopping? Y  Comment has someone bring her  Preparing Food and eating ? N  Using the Toilet? N  In the past six months, have you  accidently leaked urine? N  Do you have problems with loss of bowel control? N  Managing your Medications? N  Managing your Finances? N  Housekeeping or managing your Housekeeping? N  Some recent data might be hidden    Patient Care Team: [name], FNP as PCP - General (General Practice)    Assessment:   This is a routine wellness examination for Brittany Crawford.  Exercise Activities and Dietary recommendations Current Exercise Habits: Home exercise routine, Type of exercise: stretching;walking, Time (Minutes): 10, Frequency (Times/Week): 2, Weekly Exercise (Minutes/Week): 20  Goals    . Patient Stated     Wants to get off sugar. Eat better       Fall Risk Fall Risk  04/13/2019 03/24/2019 02/07/2019 01/26/2019 01/25/2019  Falls in the past year? 0 0 0 0 0  Risk for fall due to : Medication side effect - - - -  Follow up Falls evaluation completed;Education provided;Falls prevention discussed - - - -   Is the patient's home free of loose throw rugs in walkways, pet beds, electrical cords, etc?   yes      Grab bars in the bathroom? yes      Handrails on the stairs?   yes      Adequate lighting?   yes  Timed Get Up and Go performed: n/a  Depression Screen PHQ 2/9 Scores 04/13/2019 03/24/2019 02/07/2019 01/26/2019  PHQ - 2 Score 0 0 0 0  PHQ- 9 Score 0 - - -     Cognitive Function     6CIT Screen 04/13/2019  What Year? 0 points  What month? 0 points  What time? 0 points  Count back from 20 0 points  Months in reverse 2 points   Repeat phrase 2 points  Total Score 4    Immunization History  Administered Date(s) Administered  . Influenza,inj,Quad PF,6+ Mos 07/08/2014  . Influenza-Unspecified 08/11/2018  . Pneumococcal Polysaccharide-23 07/08/2014    Qualifies for Shingles Vaccine? yes  Screening Tests Health Maintenance  Topic Date Due  . PAP SMEAR-Modifier  09/09/1979  . TETANUS/TDAP  09/02/2019 (Originally 09/08/1977)  . INFLUENZA VACCINE  05/14/2019  . MAMMOGRAM  03/29/2020  . COLONOSCOPY  07/05/2022  . Hepatitis C Screening  Completed  . HIV Screening  Completed    Cancer Screenings: Lung: Low Dose CT Chest recommended if Age 2-80 years, 30 pack-year currently smoking OR have quit w/in 15years. Patient does not qualify. Breast:  Up to date on Mammogram? Yes   Up to date of Bone Density/Dexa? n/a Colorectal: up to date  Additional Screenings: : Hepatitis C Screening: 09/01/2018     Plan:    6 CIT was 4. Wants to stop eating sugar and eat more healthy.   I have personally reviewed and noted the following in the patient's chart:   . Medical and social history . Use of alcohol, tobacco or illicit drugs  . Current medications and supplements . Functional ability and status . Nutritional status . Physical activity . Advanced directives . List of other physicians . Hospitalizations, surgeries, and ER visits in previous 12 months . Vitals . Screenings to include cognitive, depression, and falls . Referrals and appointments  In addition, I have reviewed and discussed with patient certain preventive protocols, quality metrics, and best practice recommendations. A written personalized care plan for preventive services as well as general preventive health recommendations were provided to patient.     Kellie Simmering, LPN  10/18/1094

## 2019-04-14 LAB — PTH, INTACT AND CALCIUM: PTH: 39 pg/mL (ref 15–65)

## 2019-04-14 LAB — PROTEIN ELECTROPHORESIS, SERUM
A/G Ratio: 1.1 (ref 0.7–1.7)
Albumin ELP: 3.9 g/dL (ref 2.9–4.4)
Alpha 1: 0.2 g/dL (ref 0.0–0.4)
Alpha 2: 0.7 g/dL (ref 0.4–1.0)
Beta: 1.2 g/dL (ref 0.7–1.3)
Gamma Globulin: 1.5 g/dL (ref 0.4–1.8)
Globulin, Total: 3.6 g/dL (ref 2.2–3.9)
Total Protein: 7.5 g/dL (ref 6.0–8.5)

## 2019-04-14 LAB — BMP8+EGFR
BUN/Creatinine Ratio: 11 — ABNORMAL LOW (ref 12–28)
BUN: 12 mg/dL (ref 8–27)
CO2: 21 mmol/L (ref 20–29)
Calcium: 10.2 mg/dL (ref 8.7–10.3)
Chloride: 102 mmol/L (ref 96–106)
Creatinine, Ser: 1.07 mg/dL — ABNORMAL HIGH (ref 0.57–1.00)
GFR calc Af Amer: 65 mL/min/{1.73_m2} (ref >59)
GFR calc non Af Amer: 57 mL/min/{1.73_m2} — ABNORMAL LOW (ref >59)
Glucose: 106 mg/dL — ABNORMAL HIGH (ref 65–99)
Potassium: 3.8 mmol/L (ref 3.5–5.2)
Sodium: 139 mmol/L (ref 134–144)

## 2019-04-18 ENCOUNTER — Other Ambulatory Visit: Payer: Self-pay | Admitting: Adult Health

## 2019-04-18 NOTE — Telephone Encounter (Signed)
Called pt and received request and per her preference she stated did not want to take this.

## 2019-04-25 ENCOUNTER — Telehealth: Payer: Self-pay | Admitting: *Deleted

## 2019-04-25 NOTE — Telephone Encounter (Signed)
(  late entry) Prior auth submitted 11/30/18 to [facility] for dicofenac sodium (Voltaren Gel 1%) Approval given 10/11/2018 through 10/13/2019

## 2019-04-26 DIAGNOSIS — H2012 Chronic iridocyclitis, left eye: Secondary | ICD-10-CM | POA: Diagnosis not present

## 2019-04-26 DIAGNOSIS — H26492 Other secondary cataract, left eye: Secondary | ICD-10-CM | POA: Diagnosis not present

## 2019-05-10 ENCOUNTER — Other Ambulatory Visit: Payer: Self-pay

## 2019-05-10 ENCOUNTER — Encounter: Payer: Self-pay | Admitting: Adult Health

## 2019-05-10 ENCOUNTER — Ambulatory Visit (INDEPENDENT_AMBULATORY_CARE_PROVIDER_SITE_OTHER): Payer: Medicare HMO | Admitting: Adult Health

## 2019-05-10 VITALS — BP 125/80 | HR 68 | Temp 98.0°F | Ht 67.5 in | Wt 200.4 lb

## 2019-05-10 DIAGNOSIS — M5481 Occipital neuralgia: Secondary | ICD-10-CM

## 2019-05-10 DIAGNOSIS — R519 Headache, unspecified: Secondary | ICD-10-CM

## 2019-05-10 DIAGNOSIS — D352 Benign neoplasm of pituitary gland: Secondary | ICD-10-CM | POA: Diagnosis not present

## 2019-05-10 DIAGNOSIS — R51 Headache: Secondary | ICD-10-CM | POA: Diagnosis not present

## 2019-05-10 NOTE — Patient Instructions (Addendum)
Your Plan:  Referral placed to ENT for further evaluation on MRI showing potential pituitary microadenoma which are typically benign growth within the pituitary gland as well as your ophthalmologist stating concerns of chronic sinus issues  You may also be experiencing symptoms of occipital neuralgia and would recommend nerve blocks for this pain.  Information provided below regarding nerve blocks.  Please let us know if you are interested in these to schedule visit      Thank you for coming to see [place] at [facility]. I hope we have been able to provide you high quality care today.  You may receive a patient satisfaction survey over the next few weeks. We would appreciate your feedback and comments so that we may continue to improve ourselves and the health of our patients.  OCCIPITAL NEURALGIA   INTRODUCTION Neuralgia is a form of neuropathic pain that is characterized by the following features [1-3]:  ?Paroxysmal, brief (seconds to a few minutes), shock- or lightning-like pain that follows a peripheral or cranial nerve distribution and can spread to adjacent areas in the course of the attack.  ?By definition, no objective neurologic deficits are found in the distribution of the affected nerve. (See "Overview of craniofacial pain".)  ?Attacks can be provoked by nonpainful stimulation (allodynia) of trigger points or zones.  ?A refractory period follows attacks; the duration of the refractory period shortens as the disease progresses.  Occipital neuralgia can be a cause of headache in the occipital region. It is described as a unilateral or bilateral paroxysmal shooting or stabbing pain in posterior part of the scalp, involving the greater, lesser, and/or third occipital nerve distribution, sometimes accompanied by diminished sensation or dysesthesia in the affected area, and commonly associated with tenderness over the involved nerve or nerves [4,5].  This topic will  review clinical aspects of occipital neuralgia. Other cranial neuralgias and central causes of facial pain are discussed separately. (See "Overview of craniofacial pain" and "Central neuropathic facial pain" and "Cold stimulus headache" and "Nervus intermedius neuralgia" and "Nummular headache" and "Trigeminal neuralgia".)  ANATOMY Sensation in the posterior neck is supplied by medial branches of dorsal primary rami of cervical nerve roots C2, C3, C4, and occasionally C5 (figure 1).  The greater occipital nerve originates from the second cervical nerve, perforates the semispinalis capitis and trapezius muscles, and supplies sensation to the uppermost neck and posterior head from the suboccipital area to the vertex.  The third occipital nerve originates from the third cervical spinal nerve, perforates the trapezius muscle, and supplies sensation to the upper posterior neck and lower occipital region of the scalp, medial to the greater occipital nerve territory.  The lesser occipital nerve is formed by divisions of the second and third cervical nerves, ascends along the posterior margin of the sternocleidomastoid muscle, and provides sensory fibers to the area of the scalp lateral to the greater occipital nerve, behind and superior to the auricle [6-8].  PATHOPHYSIOLOGY The pathophysiology of occipital neuralgia is uncertain. One hypothesis is that occipital neuralgia results from injury to the C2-C3 nerve roots and/or occipital nerves through different mechanisms (chronic instability, entrapment, trauma, inflammation) [9]. Some cases develop following Arnold-Chiari malformation surgery or other surgeries at the craniocervical junction and upper cervical spine [10,11]. Some have proposed vascular compression of the nerve by the occipital artery [12-14].  The currently accepted view is that this disorder results from the chronic entrapment of the occipital nerves by the posterior neck and scalp muscles  [15-17]. However, there is  no compelling evidence to support the concept of irritation of the occipital nerves as the cause [15]. In addition, there is no indisputable evidence from surgical studies to support entrapment of the occipital nerves as the etiology.  Most often, occipital neuralgia develops spontaneously.   Occipital nerve block - Anesthetic nerve block (typically along with a glucocorticoid agent) helps support the diagnosis of occipital neuralgia in the right clinical context and provides immediate or partial pain relief, which may extend weeks or even months [17,31,32]. Importantly, however, not all occipital headaches occur on the basis of occipital neuralgia, and a favorable response to occipital nerve block does not equate to a diagnosis of occipital neuralgia. Other primary and secondary headache disorders may respond favorably to an occipital nerve block, which stresses the need to fulfill International Classification of Headache Disorders, 3rd edition (ICHD-3) diagnostic criteria. The procedure should be performed by physicians who are trained to perform the procedure. In one case series of 57 patients diagnosed with occipital neuralgia, administration of lidocaine or bupivacaine and 4 mg dexamethasone targeting either the greater or both greater and lesser occipital nerves showed 95 percent response rate assessed at 24 hours and at 87-month follow-up. During that time, over 80 percent of patients required no adjunctive medication for pain control, and the mean interval to repeat injection for symptoms relief was 270 days [33]. In another single-center registry, 69 patients receiving an injection of 1% lidocaine without a corticosteroid reported a response rate of optimal pain relief in 73 percent, with more than 95 percent of patients reporting pain relief lasting two or more months [34].  ONB is usually performed by targeting tender points approximating affected branches of the C2 nerve,  either the greater (GON) or lesser occipital nerve (LON). Injection sites are identified with surface localization using the external occipital protuberance as reference landmark with GON 2 cm inferior and laterally and LON 5 cm laterally [33]. Other techniques to identify injection site include using one-third the distance from the external occipital protuberance to the mastoid process or using ultrasound to identify the occipital artery as typically just lateral to the GON [11,32,35,36].  The procedure can be repeated when pain recurs [8,17]. A review by the American Headache Society recommended at least three-month interval between injections if corticosteroids are used [37].  The procedure is generally safe with few complications, as long as the anesthetic/glucocorticoid injection is not inadvertently injected intravascularly [38]. Rarely, a patient may become cushingoid secondary to serial glucocorticoid-containing blocks [31,39]. In one retrospective review of 72 patients receiving a total of 315 nerve blocks, 9 percent reported adverse effects, most commonly transient dizziness or elevated blood pressure. While adverse effects were more common with bilateral injections, no difference was seen between lidocaine at 1, 2, or 5% [40].

## 2019-05-10 NOTE — Progress Notes (Signed)
[facility] [address] [place]. [place] [address] [phone]       FOLLOW UP NOTE  Ms. [name] Date of Birth:  [birth year] Medical Record Number:  [id number]   Reason for visit: Occipital headaches    CHIEF COMPLAINT:  Chief Complaint  Patient presents with  . Follow-up    Room 9, Occipital Neuralagia of the left side.  . Neck Pain  . Headache    HPI: 05/10/2019 visit: Ms. Iman is a 63 year old female who is being seen today for follow-up regarding occipital headaches and neck pain.  Recently had follow-up with ophthalmology with finding of chronic sinus issues per patient report.  She has since started on allergy medication with benefit of frequent headaches.  She does endorse left occipital nerve pain symptoms that are worse at night especially with head movements and will radiate to her left ear, left eye and frontal region of head.  Characterized sensation as sharp and stabbing and will only last for a short duration.  At prior visit, recommended use of Tegretol as Topamax did not provide benefit but she did not start this medication as she did not feel as though it was necessary.   01/26/2019 virtual visit: She was last evaluated in this office by Dr. Leonie Man on 10/26/2018 due to bothersome posterior headaches and occasional neck pain.  She has trialed OTC medications without benefit, muscle relaxant without benefit and gabapentin in the past for different indication but reports severe side effects.  Underlying history of stroke and denies any new or worsening symptoms.  It was felt as though occipital headache and posterior neck pain likely related to mechanical muscle skeletal etiology but important to rule out any abnormalities within the brain or neck.  Initiated Topamax 25 mg twice daily along with recommendations of neck stretching.  MRI cervical spine and brain recommended but unfortunately insurance declined MRI cervical spine but MRI brain was  completed at [facility] on 12/14/2018 which did not show any acute intracranial abnormalities but did note potential pituitary microadenoma along with abnormality of left eyeball and to follow with ophthalmology.  She does have underlying history of left eyeball abnormalities and routinely follows with ophthalmology.  She does continue to have almost daily neck/occipital region pain despite neck exercises and use of Topamax.  She endorses a sharp pain that only lasts a couple seconds and typically occurs with change of head movement especially while looking towards her right side.  Pain scale 10/10 and located on the left side greater in the occipital region and can radiate up towards her left ear.  She denies any radiating pain down her back or into her arm.  She has had this pain for the past year and has been consistent without any worsening.     ROS:   14 system review of systems performed and negative with exception of nerve pain, headache  PMH:  Past Medical History:  Diagnosis Date  . Chronic back pain   . H/O sarcoidosis   . Hypertension   . Seasonal allergies   . Stroke [facility])     PSH:  Past Surgical History:  Procedure Laterality Date  . BREAST BIOPSY Left 04/29/2016  . BREAST BIOPSY Left 04/23/2016  . DILATATION & CURRETTAGE/HYSTEROSCOPY WITH RESECTOCOPE N/A 12/10/2012   Procedure: DILATATION & CURETTAGE/HYSTEROSCOPY WITH RESECTOCOPE;  Surgeon: Marvene Staff, MD;  Location: [facility];  Service: Gynecology;  Laterality: N/A;  . FOOT SURGERY     left-pins placed  .  LYMPH NODE BIOPSY    . POLYPECTOMY N/A 12/10/2012   Procedure: POLYPECTOMY;  Surgeon: Marvene Staff, MD;  Location: [facility];  Service: Gynecology;  Laterality: N/A;  . STRABISMUS SURGERY Left 04/17/2017   Procedure: REPAIR STRABISMUS LEFT EYE;  Surgeon: Everitt Amber, MD;  Location: [facility];  Service: Ophthalmology;  Laterality: Left;    Social History:  Social History   Socioeconomic  History  . Marital status: Married    Spouse name: Not on file  . Number of children: 2  . Years of education: 63  . Highest education level: Not on file  Occupational History  . Occupation: disability  Social Needs  . Financial resource strain: Not hard at all  . Food insecurity    Worry: Never true    Inability: Never true  . Transportation needs    Medical: No    Non-medical: No  Tobacco Use  . Smoking status: Former Smoker    Packs/day: 0.00    Years: 0.00    Pack years: 0.00    Types: Cigarettes    Quit date: 03/13/2017    Years since quitting: 2.1  . Smokeless tobacco: Never Used  . Tobacco comment: 4  to 5  cigarettes daily for years  Substance and Sexual Activity  . Alcohol use: Not Currently    Comment: occassionally  . Drug use: Yes    Types: Hydrocodone  . Sexual activity: Yes  Lifestyle  . Physical activity    Days per week: 2 days    Minutes per session: 10 min  . Stress: Not at all  Relationships  . Social Herbalist on phone: Not on file    Gets together: Not on file    Attends religious service: Not on file    Active member of club or organization: Not on file    Attends meetings of clubs or organizations: Not on file    Relationship status: Not on file  . Intimate partner violence    Fear of current or ex partner: No    Emotionally abused: No    Physically abused: No    Forced sexual activity: No  Other Topics Concern  . Not on file  Social History Narrative   Patient is married with 2 children.   Patient is right handed.   Patient has hs education.   Patient drinks 4 cups daily.    Family History:  Family History  Problem Relation Age of Onset  . Colon cancer Maternal Uncle   . Stroke Mother     Medications:   Current Outpatient Medications on File Prior to Visit  Medication Sig Dispense Refill  . Ascorbic Acid (VITAMIN C) 1000 MG tablet Take 1,000 mg by mouth daily. Takes when coming down with a cold.    Marland Kitchen aspirin 325 MG  tablet Take 1 tablet (325 mg total) by mouth daily. (Patient taking differently: Take 81 mg by mouth daily. ) 30 tablet 3  . cetirizine (ZYRTEC) 10 MG tablet Take 10 mg by mouth daily as needed for allergies.    . Cholecalciferol (D3 ADULT PO) Take 400 Units by mouth.    . diclofenac sodium (VOLTAREN) 1 % GEL Apply 2 g topically 4 (four) times daily. 300 g 2  . HYDROcodone-acetaminophen (NORCO) 7.5-325 MG tablet Take 1 tablet by mouth every 8 (eight) hours as needed for moderate pain. 85 tablet 0  . Hypromellose (ARTIFICIAL TEARS OP) Place 1 drop into the right eye  daily as needed (for dry eyes).    Marland Kitchen losartan-hydrochlorothiazide (HYZAAR) 100-12.5 MG tablet Take 1 tablet by mouth daily.    . Multiple Vitamin (MULTIVITAMIN WITH MINERALS) TABS tablet Take 1 tablet by mouth daily.    . Olopatadine HCl 0.2 % SOLN Apply 1 drop to eye daily as needed for allergies.  3  . Pitavastatin Calcium (LIVALO) 2 MG TABS Take 2 mg by mouth.    . prednisoLONE acetate (PRED FORTE) 1 % ophthalmic suspension Place 1 drop into the left eye as needed (flare ups).     . triamcinolone cream (KENALOG) 0.1 % Apply 1 application topically at bedtime as needed (for rash). 30 g 0   No current facility-administered medications on file prior to visit.     Allergies:   Allergies  Allergen Reactions  . Gadobenate Nausea And Vomiting    Pt was fine after a few minutes , vomiting after contrast injection smills      Physical Exam  Vitals:   05/10/19 0958  BP: 125/80  Pulse: 68  Temp: 98 F (36.7 C)  Weight: 200 lb 6.4 oz (90.9 kg)  Height: 5' 7.5" (1.715 m)   Body mass index is 30.92 kg/m. No exam data present   General: well developed, well nourished,  pleasant middle-aged African-American female, seated, in no evident distress Head: head normocephalic and atraumatic.   Neck: supple with no carotid or supraclavicular bruits Cardiovascular: regular rate and rhythm, no murmurs Musculoskeletal: no  deformity Skin:  no rash/petichiae Vascular:  Normal pulses all extremities   Neurologic Exam Mental Status: Awake and fully alert. Oriented to place and time. Recent and remote memory intact. Attention span, concentration and fund of knowledge appropriate. Mood and affect appropriate.  Cranial Nerves:  Pupils equal, briskly reactive to light. Extraocular movements full without nystagmus. Visual fields full to confrontation. Hearing intact. Facial sensation intact. Face, tongue, palate moves normally and symmetrically.  Tenderness within left lesser occipital region. Motor: Normal bulk and tone. Normal strength in all tested extremity muscles. Sensory.: intact to touch , pinprick , position and vibratory sensation.  Coordination: Rapid alternating movements normal in all extremities. Finger-to-nose and heel-to-shin performed accurately bilaterally. Gait and Station: Arises from chair without difficulty. Stance is normal. Gait demonstrates normal stride length and balance Reflexes: 1+ and symmetric. Toes downgoing.     MRI HEAD WO W IV CONTRAST 12/14/2018 IMPRESSION:  1. Abnormal left eyeball, which is abnormally large and nonspherical. It is unclear if this is congenital or acquired. This may represent a coloboma or morning glory anomaly (congenital) or staphyloma (acquired). The differential diagnosis also includes  axial myopia (not favored) and glaucoma. Probable mild-to-moderate left optic nerve atrophy versus hypoplasia. Ophthalmology referral/consultation is recommended, if not already obtained. 2. Although this study is not optimized to evaluate the pituitary gland, there appears to be an approximately 8 mm nonenhancing or hypoenhancing lesion centered in the left side of the adenohypophysis, compatible with a microadenoma. Please correlate  clinically. If clinically warranted, a follow-up MRI of the brain/sella without and with IV contrast (pituitary protocol) could be obtained for further  evaluation. 3. Old lacunar infarctions, as above. 4. No evidence of acute intracranial abnormality.    ASSESSMENT: ANGEL HOBDY is a 63 y.o. year old female with underlying medical history of right thalamic/internal capsule infarct in 06/2014, HTN, HLD, tobacco use and THC use who continues to be followed in this office with complaints of occipital headache and neck pain.   PLAN:  -  Ongoing headaches likely multifactorial with likely occipital neuralgia and chronic sinus conditions.  Also finding on prior MRI potential pituitary microadenoma.  Referral placed to ENT for further evaluation.  Discussion regarding occipital nerve block for treatment of likely occipital neuralgia.  She is agreeable to pursuing treatment option and will schedule follow-up visit to receive nerve block injections.  She was also advised to continue to follow with ophthalmology scheduled.     Follow up in 3 months or call earlier if needed   Greater than 50% of time during this 25 minute visit was spent on counseling, explanation of diagnosis of occipital headaches likely related to occipital neuralgia and chronic sinus conditions, finding on prior MRI potential pituitary microadenoma, discussion regarding occipital nerve blocks, planning of further management along with potential future management, and discussion with patient and family answering all questions.    Venancio Poisson, AGNP-BC  [facility] [address] [place], [place] [address]  Phone [phone] Fax [phone] Note: This document was prepared with digital dictation and possible smart phrase technology. Any transcriptional errors that result from this process are unintentional.

## 2019-05-11 NOTE — Progress Notes (Signed)
I agree with the above plan 

## 2019-05-19 ENCOUNTER — Encounter: Payer: Self-pay | Admitting: Family Medicine

## 2019-05-19 ENCOUNTER — Ambulatory Visit (INDEPENDENT_AMBULATORY_CARE_PROVIDER_SITE_OTHER): Payer: Medicare HMO | Admitting: Family Medicine

## 2019-05-19 ENCOUNTER — Other Ambulatory Visit: Payer: Self-pay

## 2019-05-19 ENCOUNTER — Ambulatory Visit
Admission: RE | Admit: 2019-05-19 | Discharge: 2019-05-19 | Disposition: A | Discharge status: Home / Self Care | Payer: Medicare HMO | Source: Ambulatory Visit | Attending: Nurse Practitioner | Admitting: Nurse Practitioner

## 2019-05-19 DIAGNOSIS — R519 Headache, unspecified: Secondary | ICD-10-CM

## 2019-05-19 DIAGNOSIS — Z1231 Encounter for screening mammogram for malignant neoplasm of breast: Secondary | ICD-10-CM

## 2019-05-19 DIAGNOSIS — R51 Headache: Secondary | ICD-10-CM

## 2019-05-19 DIAGNOSIS — M5481 Occipital neuralgia: Secondary | ICD-10-CM | POA: Diagnosis not present

## 2019-05-19 NOTE — Progress Notes (Signed)
Nerve block w/o steroid: Pt signed consent  0.5% Bupivocaine 1.5 mL LOT: [id number] EXP: 10/2019 NDC: 67011-003-49   2% Lidocaine 1.5 mL LOT: [id number] EXP: 04/2020 NDC: 3912-2583-46

## 2019-05-19 NOTE — Progress Notes (Signed)
History: Mrs Montone has had occipital pain for several months not responsive to oral medications. She presents today for first nerve block.        Bupivicaine injection/Lidocaine protocol for occipital  neuralgia   Performed by Debbora Presto, FNP.30-gauge needle was used. All procedures as documented were medically necessary, reasonable and appropriate based on the patient's history, medical diagnosis and physician opinion. Verbal informed consent was obtained from the patient, patient was informed of potential risk of procedure, including bruising, bleeding, hematoma formation, infection, muscle weakness, muscle pain, numbness, transient hypertension, transient hyperglycemia and transient insomnia among others. All areas injected were topically clean with isopropyl rubbing alcohol. Nonsterile nonlatex gloves were worn during the procedure.   1. Greater occipital nerve block [phone]). The greater occipital nerve site was identified at the nuchal line medial to the occipital artery. Medication was injected into the left occipital nerve areas and suboccipital areas. Patient's condition is associated with inflammation of the greater occipital nerve and associated multiple groups. Injection was deemed medically necessary, reasonable and appropriate. Injection represents a separate and unique surgical service.    The patient tolerated the injections well, no complications of the procedure were noted. Injections were made with a 30-gauge needle.

## 2019-05-23 NOTE — Progress Notes (Signed)
I agree with the above plan 

## 2019-05-24 ENCOUNTER — Encounter: Payer: Medicare HMO | Attending: Registered Nurse | Admitting: Registered Nurse

## 2019-05-24 ENCOUNTER — Other Ambulatory Visit: Payer: Self-pay

## 2019-05-24 ENCOUNTER — Encounter: Payer: Self-pay | Admitting: Registered Nurse

## 2019-05-24 VITALS — BP 124/84 | HR 75 | Temp 97.5°F | Resp 16 | Ht 67.5 in | Wt 198.2 lb

## 2019-05-24 DIAGNOSIS — Z800 Family history of malignant neoplasm of digestive organs: Secondary | ICD-10-CM | POA: Insufficient documentation

## 2019-05-24 DIAGNOSIS — M25562 Pain in left knee: Secondary | ICD-10-CM | POA: Insufficient documentation

## 2019-05-24 DIAGNOSIS — M7061 Trochanteric bursitis, right hip: Secondary | ICD-10-CM | POA: Insufficient documentation

## 2019-05-24 DIAGNOSIS — M25561 Pain in right knee: Secondary | ICD-10-CM | POA: Insufficient documentation

## 2019-05-24 DIAGNOSIS — Z87891 Personal history of nicotine dependence: Secondary | ICD-10-CM | POA: Diagnosis not present

## 2019-05-24 DIAGNOSIS — G894 Chronic pain syndrome: Secondary | ICD-10-CM

## 2019-05-24 DIAGNOSIS — M546 Pain in thoracic spine: Secondary | ICD-10-CM

## 2019-05-24 DIAGNOSIS — M47816 Spondylosis without myelopathy or radiculopathy, lumbar region: Secondary | ICD-10-CM | POA: Diagnosis not present

## 2019-05-24 DIAGNOSIS — M5136 Other intervertebral disc degeneration, lumbar region: Secondary | ICD-10-CM | POA: Insufficient documentation

## 2019-05-24 DIAGNOSIS — Z5181 Encounter for therapeutic drug level monitoring: Secondary | ICD-10-CM | POA: Diagnosis not present

## 2019-05-24 DIAGNOSIS — M129 Arthropathy, unspecified: Secondary | ICD-10-CM | POA: Insufficient documentation

## 2019-05-24 DIAGNOSIS — G8929 Other chronic pain: Secondary | ICD-10-CM

## 2019-05-24 DIAGNOSIS — Z79899 Other long term (current) drug therapy: Secondary | ICD-10-CM | POA: Insufficient documentation

## 2019-05-24 DIAGNOSIS — M7062 Trochanteric bursitis, left hip: Secondary | ICD-10-CM | POA: Diagnosis not present

## 2019-05-24 DIAGNOSIS — Z79891 Long term (current) use of opiate analgesic: Secondary | ICD-10-CM

## 2019-05-24 DIAGNOSIS — Z760 Encounter for issue of repeat prescription: Secondary | ICD-10-CM | POA: Diagnosis not present

## 2019-05-24 DIAGNOSIS — I10 Essential (primary) hypertension: Secondary | ICD-10-CM | POA: Diagnosis not present

## 2019-05-24 MED ORDER — HYDROCODONE-ACETAMINOPHEN 7.5-325 MG PO TABS: 1.0000 | ORAL_TABLET | Freq: Three times a day (TID) | ORAL | 0 refills | Status: DC | PRN

## 2019-05-24 NOTE — Progress Notes (Signed)
Subjective:    Patient ID: Brittany Crawford, female    DOB: 02/25/1956, 63 y.o.   MRN: [id number]  HPI: Brittany Crawford is a 63 y.o. female who returns for follow up appointment for chronic pain and medication refill. She states her pain is located in her mid- lower back. She rates her pain 4. Her  current exercise regime is walking, riding stationary bicycle three days a week, Nordic Trak twice a week and performing stretching exercises.  Ms. Bruneau Morphine equivalent is 22.77 MME.  Last UDS was Performed on 03/24/2019, it was consistent.   Pain Inventory Average Pain 5 Pain Right Now 4 My pain is constant  In the last 24 hours, has pain interfered with the following? General activity 4 Relation with others 4 Enjoyment of life 4 What TIME of day is your pain at its worst? morning Sleep (in general) Fair  Pain is worse with: na Pain improves with: medication Relief from Meds: 9  Mobility walk with assistance use a cane ability to climb steps?  yes do you drive?  no  Function disabled: date disabled .  Neuro/Psych tingling  Prior Studies Any changes since last visit?  no  Physicians involved in your care Any changes since last visit?  no   Family History  Problem Relation Age of Onset  . Colon cancer Maternal Uncle   . Stroke Mother    Social History   Socioeconomic History  . Marital status: Married    Spouse name: Not on file  . Number of children: 2  . Years of education: 29  . Highest education level: Not on file  Occupational History  . Occupation: disability  Social Needs  . Financial resource strain: Not hard at all  . Food insecurity    Worry: Never true    Inability: Never true  . Transportation needs    Medical: No    Non-medical: No  Tobacco Use  . Smoking status: Former Smoker    Packs/day: 0.00    Years: 0.00    Pack years: 0.00    Types: Cigarettes    Quit date: 03/13/2017    Years since quitting: 2.1  . Smokeless tobacco:  Never Used  . Tobacco comment: 4  to 5  cigarettes daily for years  Substance and Sexual Activity  . Alcohol use: Not Currently    Comment: occassionally  . Drug use: Yes    Types: Hydrocodone  . Sexual activity: Yes  Lifestyle  . Physical activity    Days per week: 2 days    Minutes per session: 10 min  . Stress: Not at all  Relationships  . Social Herbalist on phone: Not on file    Gets together: Not on file    Attends religious service: Not on file    Active member of club or organization: Not on file    Attends meetings of clubs or organizations: Not on file    Relationship status: Not on file  Other Topics Concern  . Not on file  Social History Narrative   Patient is married with 2 children.   Patient is right handed.   Patient has hs education.   Patient drinks 4 cups daily.   Past Surgical History:  Procedure Laterality Date  . BREAST BIOPSY Left 04/29/2016  . BREAST BIOPSY Left 04/23/2016  . DILATATION & CURRETTAGE/HYSTEROSCOPY WITH RESECTOCOPE N/A 12/10/2012   Procedure: DILATATION & CURETTAGE/HYSTEROSCOPY WITH RESECTOCOPE;  Surgeon: Marvene Staff,  MD;  Location: [facility];  Service: Gynecology;  Laterality: N/A;  . FOOT SURGERY     left-pins placed  . LYMPH NODE BIOPSY    . POLYPECTOMY N/A 12/10/2012   Procedure: POLYPECTOMY;  Surgeon: Marvene Staff, MD;  Location: [facility];  Service: Gynecology;  Laterality: N/A;  . STRABISMUS SURGERY Left 04/17/2017   Procedure: REPAIR STRABISMUS LEFT EYE;  Surgeon: Everitt Amber, MD;  Location: [facility];  Service: Ophthalmology;  Laterality: Left;   Past Medical History:  Diagnosis Date  . Chronic back pain   . H/O sarcoidosis   . Hypertension   . Seasonal allergies   . Stroke ([place])    LMP 11/13/2012   Opioid Risk Score:   Fall Risk Score:  `1  Depression screen PHQ 2/9  Depression screen [facility] 2/9 04/13/2019 03/24/2019 02/07/2019 01/26/2019 01/25/2019 09/24/2018 09/15/2018  Decreased  Interest 0 0 0 0 0 0 0  Down, Depressed, Hopeless 0 0 0 0 0 0 0  PHQ - 2 Score 0 0 0 0 0 0 0  Altered sleeping 0 - - - - - -  Tired, decreased energy 0 - - - - - -  Change in appetite 0 - - - - - -  Feeling bad or failure about yourself  0 - - - - - -  Trouble concentrating 0 - - - - - -  Moving slowly or fidgety/restless 0 - - - - - -  Suicidal thoughts 0 - - - - - -  PHQ-9 Score 0 - - - - - -     Review of Systems  Constitutional: Negative.   HENT: Negative.   Eyes: Negative.   Respiratory: Negative.   Cardiovascular: Negative.   Gastrointestinal: Negative.   Endocrine: Negative.   Musculoskeletal: Positive for gait problem.  Skin: Negative.   Allergic/Immunologic: Negative.   Psychiatric/Behavioral: Negative.        Objective:   Physical Exam Vitals signs and nursing note reviewed.  Constitutional:      Appearance: Normal appearance.  Neck:     Musculoskeletal: Normal range of motion and neck supple.  Cardiovascular:     Rate and Rhythm: Normal rate and regular rhythm.  Pulmonary:     Effort: Pulmonary effort is normal.     Breath sounds: Normal breath sounds.  Musculoskeletal:     Comments: Normal Muscle Bulk and Muscle Testing Reveals:  Upper Extremities: Full ROM and Muscle Strength 5/5  Lumbar Paraspinal Tenderness: L-4-L-5  Lower Extremities: Full  ROM and Muscle Strength  5/5 Arises from Table with ease Narrow Based Gait   Skin:    General: Skin is warm and dry.  Neurological:     Mental Status: She is alert and oriented to person, place, and time.  Psychiatric:        Mood and Affect: Mood normal.        Behavior: Behavior normal.           Assessment & Plan:  1. Lumbar spondylosis with DDD and facet arthropathy.08//08/2019 Refilled:Hydrocodone 7.5 /325 mg one tablet every 8 hours as needed for pain #85.Second scripte-scribefor the following month. We will continue the opioid monitoring program, this consists of regular clinic visits,  examinations, urine drug screen, pill counts as well as use of [place] Controlled Substance Reporting System. 2. Bilateral knee pain: No complaints voiced today:Continue with heat/ice, exercise and Diclofenac.05/24/2019 3. Right thalamic/internal capsule lacunar infarct with persistent left hemisensory deficits: Continue to Monitor.05/24/2019 4.  Bilateral Greater Trochanteric Bursitis:No complaints Today.Continue current medication regime.05/24/2019  15 minutes of face to face patient care time was spent during this visit. All questions were encouraged and answered.   F/U in 2 months

## 2019-06-08 ENCOUNTER — Other Ambulatory Visit: Payer: Self-pay | Admitting: Nurse Practitioner

## 2019-07-19 ENCOUNTER — Encounter: Payer: Self-pay | Admitting: Registered Nurse

## 2019-07-19 ENCOUNTER — Other Ambulatory Visit: Payer: Self-pay

## 2019-07-19 ENCOUNTER — Encounter: Payer: Medicare HMO | Attending: Registered Nurse | Admitting: Registered Nurse

## 2019-07-19 VITALS — BP 146/86 | HR 82 | Temp 97.3°F | Ht 67.5 in | Wt 201.0 lb

## 2019-07-19 DIAGNOSIS — I10 Essential (primary) hypertension: Secondary | ICD-10-CM | POA: Diagnosis not present

## 2019-07-19 DIAGNOSIS — M5136 Other intervertebral disc degeneration, lumbar region: Secondary | ICD-10-CM | POA: Diagnosis not present

## 2019-07-19 DIAGNOSIS — M129 Arthropathy, unspecified: Secondary | ICD-10-CM | POA: Insufficient documentation

## 2019-07-19 DIAGNOSIS — Z800 Family history of malignant neoplasm of digestive organs: Secondary | ICD-10-CM | POA: Diagnosis not present

## 2019-07-19 DIAGNOSIS — M7061 Trochanteric bursitis, right hip: Secondary | ICD-10-CM | POA: Diagnosis not present

## 2019-07-19 DIAGNOSIS — M25562 Pain in left knee: Secondary | ICD-10-CM | POA: Diagnosis not present

## 2019-07-19 DIAGNOSIS — Z79899 Other long term (current) drug therapy: Secondary | ICD-10-CM | POA: Insufficient documentation

## 2019-07-19 DIAGNOSIS — Z79891 Long term (current) use of opiate analgesic: Secondary | ICD-10-CM

## 2019-07-19 DIAGNOSIS — M47816 Spondylosis without myelopathy or radiculopathy, lumbar region: Secondary | ICD-10-CM | POA: Diagnosis not present

## 2019-07-19 DIAGNOSIS — M25561 Pain in right knee: Secondary | ICD-10-CM | POA: Insufficient documentation

## 2019-07-19 DIAGNOSIS — Z5181 Encounter for therapeutic drug level monitoring: Secondary | ICD-10-CM | POA: Diagnosis not present

## 2019-07-19 DIAGNOSIS — M7062 Trochanteric bursitis, left hip: Secondary | ICD-10-CM | POA: Insufficient documentation

## 2019-07-19 DIAGNOSIS — Z87891 Personal history of nicotine dependence: Secondary | ICD-10-CM | POA: Insufficient documentation

## 2019-07-19 DIAGNOSIS — Z760 Encounter for issue of repeat prescription: Secondary | ICD-10-CM | POA: Diagnosis not present

## 2019-07-19 DIAGNOSIS — G894 Chronic pain syndrome: Secondary | ICD-10-CM | POA: Diagnosis not present

## 2019-07-19 DIAGNOSIS — M62830 Muscle spasm of back: Secondary | ICD-10-CM

## 2019-07-19 MED ORDER — HYDROCODONE-ACETAMINOPHEN 7.5-325 MG PO TABS: 1.0000 | ORAL_TABLET | Freq: Three times a day (TID) | ORAL | 0 refills | Status: DC | PRN

## 2019-07-19 NOTE — Progress Notes (Signed)
Subjective:    Patient ID: Brittany Crawford, female    DOB: March 22, 1956, 63 y.o.   MRN: [id number]  HPI: Brittany Crawford is a 63 y.o. female who returns for follow up appointment for chronic pain and medication refill. She states her pain is located in her lower back and left hip. She rates her pain 3. Her current exercise regime is walking and performing stretching exercises.  [name] equivalent is 21.25 MME.  Last UDS was Performed on 03/24/2019, it was consistent.   Pain Inventory Average Pain 8 Pain Right Now 3 My pain is dull and aching  In the last 24 hours, has pain interfered with the following? General activity 5 Relation with others 5 Enjoyment of life 5 What TIME of day is your pain at its worst? evening Sleep (in general) Fair  Pain is worse with: walking, bending, standing and some activites Pain improves with: rest, heat/ice, therapy/exercise and medication Relief from Meds: 9  Mobility use a cane ability to climb steps?  yes do you drive?  no  Function disabled: date disabled na  Neuro/Psych No problems in this area  Prior Studies Any changes since last visit?  no  Physicians involved in your care Neurologist Dr. Leonie Man   Family History  Problem Relation Age of Onset  . Colon cancer Maternal Uncle   . Stroke Mother    Social History   Socioeconomic History  . Marital status: Married    Spouse name: Not on file  . Number of children: 2  . Years of education: 67  . Highest education level: Not on file  Occupational History  . Occupation: disability  Social Needs  . Financial resource strain: Not hard at all  . Food insecurity    Worry: Never true    Inability: Never true  . Transportation needs    Medical: No    Non-medical: No  Tobacco Use  . Smoking status: Former Smoker    Packs/day: 0.00    Years: 0.00    Pack years: 0.00    Types: Cigarettes    Quit date: 03/13/2017    Years since quitting: 2.3  . Smokeless  tobacco: Never Used  . Tobacco comment: 4  to 5  cigarettes daily for years  Substance and Sexual Activity  . Alcohol use: Not Currently    Comment: occassionally  . Drug use: Yes    Types: Hydrocodone  . Sexual activity: Yes  Lifestyle  . Physical activity    Days per week: 2 days    Minutes per session: 10 min  . Stress: Not at all  Relationships  . Social Herbalist on phone: Not on file    Gets together: Not on file    Attends religious service: Not on file    Active member of club or organization: Not on file    Attends meetings of clubs or organizations: Not on file    Relationship status: Not on file  Other Topics Concern  . Not on file  Social History Narrative   Patient is married with 2 children.   Patient is right handed.   Patient has hs education.   Patient drinks 4 cups daily.   Past Surgical History:  Procedure Laterality Date  . BREAST BIOPSY Left 04/29/2016  . BREAST BIOPSY Left 04/23/2016  . DILATATION & CURRETTAGE/HYSTEROSCOPY WITH RESECTOCOPE N/A 12/10/2012   Procedure: DILATATION & CURETTAGE/HYSTEROSCOPY WITH RESECTOCOPE;  Surgeon: Marvene Staff, MD;  Location:  [facility];  Service: Gynecology;  Laterality: N/A;  . FOOT SURGERY     left-pins placed  . LYMPH NODE BIOPSY    . POLYPECTOMY N/A 12/10/2012   Procedure: POLYPECTOMY;  Surgeon: Marvene Staff, MD;  Location: [facility];  Service: Gynecology;  Laterality: N/A;  . STRABISMUS SURGERY Left 04/17/2017   Procedure: REPAIR STRABISMUS LEFT EYE;  Surgeon: Everitt Amber, MD;  Location: [facility];  Service: Ophthalmology;  Laterality: Left;   Past Medical History:  Diagnosis Date  . Chronic back pain   . H/O sarcoidosis   . Hypertension   . Seasonal allergies   . Stroke ([facility])    BP (!) 146/86   Pulse 82   Temp (!) 97.3 F (36.3 C)   Ht 5' 7.5" (1.715 m)   Wt 201 lb (91.2 kg)   LMP 11/13/2012   SpO2 96%   BMI 31.02 kg/m   Opioid Risk Score:   Fall Risk Score:   `1  Depression screen PHQ 2/9  Depression screen [facility] 2/9 07/19/2019 04/13/2019 03/24/2019 02/07/2019 01/26/2019 01/25/2019 09/24/2018  Decreased Interest 0 0 0 0 0 0 0  Down, Depressed, Hopeless 0 0 0 0 0 0 0  PHQ - 2 Score 0 0 0 0 0 0 0  Altered sleeping - 0 - - - - -  Tired, decreased energy - 0 - - - - -  Change in appetite - 0 - - - - -  Feeling bad or failure about yourself  - 0 - - - - -  Trouble concentrating - 0 - - - - -  Moving slowly or fidgety/restless - 0 - - - - -  Suicidal thoughts - 0 - - - - -  PHQ-9 Score - 0 - - - - -    Review of Systems     Objective:   Physical Exam Vitals signs and nursing note reviewed.  Constitutional:      Appearance: Normal appearance.  Neck:     Musculoskeletal: Normal range of motion and neck supple.  Cardiovascular:     Rate and Rhythm: Normal rate and regular rhythm.     Pulses: Normal pulses.     Heart sounds: Normal heart sounds.  Pulmonary:     Effort: Pulmonary effort is normal.     Breath sounds: Normal breath sounds.  Musculoskeletal:     Comments: Normal Muscle Bulk and Muscle Testing Reveals:  Upper Extremities: Full ROM and Muscle Strength 5/5 , Lumbar Paraspinal Tenderness: L-3-L-5 Lower Extremities: Full ROM and Muscle Strength 5/5 Arises from Table with ease Narrow Based Gait   Skin:    General: Skin is warm and dry.  Neurological:     Mental Status: She is alert and oriented to person, place, and time.           Assessment & Plan:  1. Lumbar spondylosis with DDD and facet arthropathy.10//03/2019 Refilled:Hydrocodone 7.5 /325 mg one tablet every 8 hours as needed for pain #85.Second scripte-scribefor the following month. We will continue the opioid monitoring program, this consists of regular clinic visits, examinations, urine drug screen, pill counts as well as use of [place] Controlled Substance Reporting System. 2. Bilateral knee pain: No complaints voiced today:Continue with heat/ice, exercise  and Diclofenac.07/19/2019 3. Right thalamic/internal capsule lacunar infarct with persistent left hemisensory deficits: Neurology Following. Continue to Monitor.07/19/2019 4. Left Greater Trochanteric Bursitis:Continue HEP as Tolerated and Alternate Ice and Heat Therapy. Continue current medication regime. Continue to Monitor. 07/19/2019  15 minutes of face to face patient care time was spent during this visit. All questions were encouraged and answered.   F/U in22months

## 2019-08-10 ENCOUNTER — Encounter: Payer: Self-pay | Admitting: Adult Health

## 2019-08-10 ENCOUNTER — Ambulatory Visit (INDEPENDENT_AMBULATORY_CARE_PROVIDER_SITE_OTHER): Payer: Medicare HMO | Admitting: Adult Health

## 2019-08-10 ENCOUNTER — Other Ambulatory Visit: Payer: Self-pay

## 2019-08-10 VITALS — BP 156/88 | HR 88 | Temp 97.6°F | Ht 67.5 in | Wt 202.2 lb

## 2019-08-10 DIAGNOSIS — M5481 Occipital neuralgia: Secondary | ICD-10-CM | POA: Diagnosis not present

## 2019-08-10 DIAGNOSIS — R519 Headache, unspecified: Secondary | ICD-10-CM | POA: Diagnosis not present

## 2019-08-10 DIAGNOSIS — M542 Cervicalgia: Secondary | ICD-10-CM | POA: Diagnosis not present

## 2019-08-10 DIAGNOSIS — G4489 Other headache syndrome: Secondary | ICD-10-CM

## 2019-08-10 NOTE — Patient Instructions (Signed)
Your Plan:  Recommend obtaining MRI cervical spine to assess for any type of nerve impingement causing your symptoms  If your imaging does not show any worrisome findings, I will refer you to Dr. Ernestina Patches who is a specialist of occipital neuralgia     Thank you for coming to see [place] at [facility]. I hope we have been able to provide you high quality care today.  You may receive a patient satisfaction survey over the next few weeks. We would appreciate your feedback and comments so that we may continue to improve ourselves and the health of our patients.

## 2019-08-10 NOTE — Addendum Note (Signed)
Addended by: Mal Misty on: 08/10/2019 01:18 PM   Modules accepted: Orders

## 2019-08-10 NOTE — Progress Notes (Signed)
[facility] [address] [place]. [place] [address] [phone]       FOLLOW UP NOTE  [name] Date of Birth:  [birth year] Medical Record Number:  [id number]   Reason for visit: Occipital headaches    CHIEF COMPLAINT:  Chief Complaint  Patient presents with  . Follow-up    3 mon f/u. Alone. Rm 9. Patient mentioned that she is still having headaches at night. She mentioned that when she stands up to quick she gets a sharp pain in her head.  She mentioned the injections didnt help    HPI:  Update 08/10/2019: Brittany Crawford is being seen today for follow-up regarding occipital headaches.  She received occipital nerve block injections by Amy, NP on 05/19/2019.  She did endorse mild improvement for the first couple days but then pain came back.  Pain continues with only certain type of head movements typically looking side to side while laying in bed on her stomach.  Classical symptoms of occipital neuralgia bilaterally as symptoms consist of severe sharp shooting pain that only lasts a few seconds.  Tenderness present over occipital nerve area.  She is overly frustrated with continued pain and becomes tearful during appointment.  She continues to follow with physical medicine rehab for other chronic pain conditions and management.  05/10/2019 visit: Brittany Crawford is a 63 year old female who is being seen today for follow-up regarding occipital headaches and neck pain.  Recently had follow-up with ophthalmology with finding of chronic sinus issues per patient report.  She has since started on allergy medication with benefit of frequent headaches.  She does endorse left occipital nerve pain symptoms that are worse at night especially with head movements and will radiate to her left ear, left eye and frontal region of head.  Characterized sensation as sharp and stabbing and will only last for a short duration.  At prior visit, recommended use of Tegretol as Topamax did not  provide benefit but she did not start this medication as she did not feel as though it was necessary.   01/26/2019 virtual visit: She was last evaluated in this office by Dr. Leonie Man on 10/26/2018 due to bothersome posterior headaches and occasional neck pain.  She has trialed OTC medications without benefit, muscle relaxant without benefit and gabapentin in the past for different indication but reports severe side effects.  Underlying history of stroke and denies any new or worsening symptoms.  It was felt as though occipital headache and posterior neck pain likely related to mechanical muscle skeletal etiology but important to rule out any abnormalities within the brain or neck.  Initiated Topamax 25 mg twice daily along with recommendations of neck stretching.  MRI cervical spine and brain recommended but unfortunately insurance declined MRI cervical spine but MRI brain was completed at [facility] on 12/14/2018 which did not show any acute intracranial abnormalities but did note potential pituitary microadenoma along with abnormality of left eyeball and to follow with ophthalmology.  She does have underlying history of left eyeball abnormalities and routinely follows with ophthalmology.  She does continue to have almost daily neck/occipital region pain despite neck exercises and use of Topamax.  She endorses a sharp pain that only lasts a couple seconds and typically occurs with change of head movement especially while looking towards her right side.  Pain scale 10/10 and located on the left side greater in the occipital region and can radiate up towards her left ear.  She denies any radiating pain down  her back or into her arm.  She has had this pain for the past year and has been consistent without any worsening.     ROS:   14 system review of systems performed and negative with exception of nerve pain, headache  PMH:  Past Medical History:  Diagnosis Date  . Chronic back pain   . H/O sarcoidosis    . Hypertension   . Seasonal allergies   . Stroke [facility])     PSH:  Past Surgical History:  Procedure Laterality Date  . BREAST BIOPSY Left 04/29/2016  . BREAST BIOPSY Left 04/23/2016  . DILATATION & CURRETTAGE/HYSTEROSCOPY WITH RESECTOCOPE N/A 12/10/2012   Procedure: DILATATION & CURETTAGE/HYSTEROSCOPY WITH RESECTOCOPE;  Surgeon: Marvene Staff, MD;  Location: [facility];  Service: Gynecology;  Laterality: N/A;  . FOOT SURGERY     left-pins placed  . LYMPH NODE BIOPSY    . POLYPECTOMY N/A 12/10/2012   Procedure: POLYPECTOMY;  Surgeon: Marvene Staff, MD;  Location: [facility];  Service: Gynecology;  Laterality: N/A;  . STRABISMUS SURGERY Left 04/17/2017   Procedure: REPAIR STRABISMUS LEFT EYE;  Surgeon: Everitt Amber, MD;  Location: [facility];  Service: Ophthalmology;  Laterality: Left;    Social History:  Social History   Socioeconomic History  . Marital status: Married    Spouse name: Not on file  . Number of children: 2  . Years of education: 17  . Highest education level: Not on file  Occupational History  . Occupation: disability  Social Needs  . Financial resource strain: Not hard at all  . Food insecurity    Worry: Never true    Inability: Never true  . Transportation needs    Medical: No    Non-medical: No  Tobacco Use  . Smoking status: Former Smoker    Packs/day: 0.00    Years: 0.00    Pack years: 0.00    Types: Cigarettes    Quit date: 03/13/2017    Years since quitting: 2.4  . Smokeless tobacco: Never Used  . Tobacco comment: 4  to 5  cigarettes daily for years  Substance and Sexual Activity  . Alcohol use: Not Currently    Comment: occassionally  . Drug use: Yes    Types: Hydrocodone  . Sexual activity: Yes  Lifestyle  . Physical activity    Days per week: 2 days    Minutes per session: 10 min  . Stress: Not at all  Relationships  . Social Herbalist on phone: Not on file    Gets together: Not on file    Attends  religious service: Not on file    Active member of club or organization: Not on file    Attends meetings of clubs or organizations: Not on file    Relationship status: Not on file  . Intimate partner violence    Fear of current or ex partner: No    Emotionally abused: No    Physically abused: No    Forced sexual activity: No  Other Topics Concern  . Not on file  Social History Narrative   Patient is married with 2 children.   Patient is right handed.   Patient has hs education.   Patient drinks 4 cups daily.    Family History:  Family History  Problem Relation Age of Onset  . Colon cancer Maternal Uncle   . Stroke Mother     Medications:   Current Outpatient Medications on File Prior  to Visit  Medication Sig Dispense Refill  . aspirin 325 MG tablet Take 1 tablet (325 mg total) by mouth daily. (Patient taking differently: Take 81 mg by mouth daily. ) 30 tablet 3  . HYDROcodone-acetaminophen (NORCO) 7.5-325 MG tablet Take 1 tablet by mouth every 8 (eight) hours as needed for moderate pain. 85 tablet 0  . Hypromellose (ARTIFICIAL TEARS OP) Place 1 drop into the right eye daily as needed (for dry eyes).    Marland Kitchen losartan-hydrochlorothiazide (HYZAAR) 100-12.5 MG tablet TAKE 1 TABLET EVERY DAY 90 tablet 1  . Olopatadine HCl 0.2 % SOLN Apply 1 drop to eye daily as needed for allergies.  3  . prednisoLONE acetate (PRED FORTE) 1 % ophthalmic suspension Place 1 drop into the left eye as needed (flare ups).     . Ascorbic Acid (VITAMIN C) 1000 MG tablet Take 1,000 mg by mouth daily. Takes when coming down with a cold.    . cetirizine (ZYRTEC) 10 MG tablet Take 10 mg by mouth daily as needed for allergies.    . Cholecalciferol (D3 ADULT PO) Take 400 Units by mouth.    . diclofenac sodium (VOLTAREN) 1 % GEL Apply 2 g topically 4 (four) times daily. (Patient not taking: Reported on 08/10/2019) 300 g 2  . Multiple Vitamin (MULTIVITAMIN WITH MINERALS) TABS tablet Take 1 tablet by mouth daily.    .  Pitavastatin Calcium (LIVALO) 2 MG TABS Take 2 mg by mouth.    . triamcinolone cream (KENALOG) 0.1 % Apply 1 application topically at bedtime as needed (for rash). (Patient not taking: Reported on 08/10/2019) 30 g 0   No current facility-administered medications on file prior to visit.     Allergies:   Allergies  Allergen Reactions  . Gadobenate Nausea And Vomiting    Pt was fine after a few minutes , vomiting after contrast injection smills      Physical Exam  Vitals:   08/10/19 1229  BP: (!) 156/88  Pulse: 88  Temp: 97.6 F (36.4 C)  TempSrc: Oral  Weight: 202 lb 3.2 oz (91.7 kg)  Height: 5' 7.5" (1.715 m)   Body mass index is 31.2 kg/m. No exam data present   General: well developed, well nourished,  pleasant middle-aged African-American female, seated, in no evident distress Head: head normocephalic and atraumatic.   Neck: supple with no carotid or supraclavicular bruits Cardiovascular: regular rate and rhythm, no murmurs Musculoskeletal: no deformity Skin:  no rash/petichiae Vascular:  Normal pulses all extremities   Neurologic Exam Mental Status: Awake and fully alert. Oriented to place and time. Recent and remote memory intact. Attention span, concentration and fund of knowledge appropriate. Mood and affect appropriate.  Cranial Nerves:  Pupils equal, briskly reactive to light. Extraocular movements full without nystagmus. Visual fields full to confrontation. Hearing intact. Facial sensation intact. Face, tongue, palate moves normally and symmetrically.  Tenderness with pressure bilateral lesser occipital region. Motor: Normal bulk and tone. Normal strength in all tested extremity muscles. Sensory.: intact to touch , pinprick , position and vibratory sensation.  Coordination: Rapid alternating movements normal in all extremities. Finger-to-nose and heel-to-shin performed accurately bilaterally. Gait and Station: Arises from chair without difficulty. Stance is  normal. Gait demonstrates normal stride length and balance Reflexes: 1+ and symmetric. Toes downgoing.     MRI HEAD WO W IV CONTRAST 12/14/2018 IMPRESSION:  1. Abnormal left eyeball, which is abnormally large and nonspherical. It is unclear if this is congenital or acquired. This may represent a  coloboma or morning glory anomaly (congenital) or staphyloma (acquired). The differential diagnosis also includes  axial myopia (not favored) and glaucoma. Probable mild-to-moderate left optic nerve atrophy versus hypoplasia. Ophthalmology referral/consultation is recommended, if not already obtained. 2. Although this study is not optimized to evaluate the pituitary gland, there appears to be an approximately 8 mm nonenhancing or hypoenhancing lesion centered in the left side of the adenohypophysis, compatible with a microadenoma. Please correlate  clinically. If clinically warranted, a follow-up MRI of the brain/sella without and with IV contrast (pituitary protocol) could be obtained for further evaluation. 3. Old lacunar infarctions, as above. 4. No evidence of acute intracranial abnormality.    ASSESSMENT: CYLEE Crawford is a 63 y.o. year old female with underlying medical history of right thalamic/internal capsule infarct in 06/2014, HTN, HLD, tobacco use and THC use who continues to be followed in this office with complaints of occipital headache and neck pain that has been consistent for over the past year.  She has trialed conservative measures with Topamax and Tegretol but unable to tolerate.  Mild improvement of pain after nerve block injections but only lasted for 2 days   PLAN: -MRI cervical spine to rule out any underlying conditions causing continued pain -Declines repeat nerve block injections at this time -Based on results of imaging, may refer to Dr. Ernestina Patches for further evaluation and management of occipital neuralgia    Follow-up will be determined post imaging   Greater than  50% of time during this 25 minute visit was spent on discussing continued nerve pain, use of nerve block injections and indication for imaging and further treatment options.  Answered all questions to patient satisfaction    Frann Rider, [facility]  [facility] [address] [place], [place] [address]  Phone [phone] Fax [phone] Note: This document was prepared with digital dictation and possible smart phrase technology. Any transcriptional errors that result from this process are unintentional.

## 2019-08-11 NOTE — Progress Notes (Signed)
I agree with the above plan 

## 2019-08-17 ENCOUNTER — Telehealth: Payer: Self-pay | Admitting: Adult Health

## 2019-08-17 NOTE — Telephone Encounter (Signed)
[employer] Josem KaufmannEY:[id number] (exp. 11/4/230 to 02/13/20) order faxed to [facility] they will reach out to the patient to schedule.

## 2019-09-07 ENCOUNTER — Ambulatory Visit
Admission: RE | Admit: 2019-09-07 | Discharge: 2019-09-07 | Disposition: A | Discharge status: Home / Self Care | Payer: Medicare HMO | Source: Ambulatory Visit | Attending: Adult Health | Admitting: Adult Health

## 2019-09-07 ENCOUNTER — Ambulatory Visit: Payer: Medicare Other

## 2019-09-07 ENCOUNTER — Other Ambulatory Visit (HOSPITAL_COMMUNITY)
Admission: RE | Admit: 2019-09-07 | Discharge: 2019-09-07 | Disposition: A | Discharge status: Home / Self Care | Payer: Medicare HMO | Source: Ambulatory Visit | Attending: Nurse Practitioner | Admitting: Nurse Practitioner

## 2019-09-07 ENCOUNTER — Encounter: Payer: Self-pay | Admitting: Nurse Practitioner

## 2019-09-07 ENCOUNTER — Ambulatory Visit (INDEPENDENT_AMBULATORY_CARE_PROVIDER_SITE_OTHER): Payer: Medicare HMO | Admitting: Nurse Practitioner

## 2019-09-07 ENCOUNTER — Other Ambulatory Visit: Payer: Self-pay

## 2019-09-07 ENCOUNTER — Ambulatory Visit: Payer: Medicare Other | Admitting: Nurse Practitioner

## 2019-09-07 VITALS — BP 138/82 | HR 78 | Temp 98.9°F | Ht 67.5 in | Wt 203.8 lb

## 2019-09-07 DIAGNOSIS — Z0000 Encounter for general adult medical examination without abnormal findings: Secondary | ICD-10-CM

## 2019-09-07 DIAGNOSIS — Z124 Encounter for screening for malignant neoplasm of cervix: Secondary | ICD-10-CM | POA: Insufficient documentation

## 2019-09-07 DIAGNOSIS — I129 Hypertensive chronic kidney disease with stage 1 through stage 4 chronic kidney disease, or unspecified chronic kidney disease: Secondary | ICD-10-CM | POA: Diagnosis not present

## 2019-09-07 DIAGNOSIS — I10 Essential (primary) hypertension: Secondary | ICD-10-CM

## 2019-09-07 DIAGNOSIS — Z23 Encounter for immunization: Secondary | ICD-10-CM | POA: Diagnosis not present

## 2019-09-07 DIAGNOSIS — Z113 Encounter for screening for infections with a predominantly sexual mode of transmission: Secondary | ICD-10-CM

## 2019-09-07 DIAGNOSIS — Z79899 Other long term (current) drug therapy: Secondary | ICD-10-CM

## 2019-09-07 DIAGNOSIS — N182 Chronic kidney disease, stage 2 (mild): Secondary | ICD-10-CM

## 2019-09-07 DIAGNOSIS — Z1211 Encounter for screening for malignant neoplasm of colon: Secondary | ICD-10-CM

## 2019-09-07 DIAGNOSIS — M47816 Spondylosis without myelopathy or radiculopathy, lumbar region: Secondary | ICD-10-CM

## 2019-09-07 DIAGNOSIS — E782 Mixed hyperlipidemia: Secondary | ICD-10-CM

## 2019-09-07 LAB — POCT URINALYSIS DIPSTICK
Bilirubin, UA: NEGATIVE
Glucose, UA: NEGATIVE
Ketones, UA: NEGATIVE
Nitrite, UA: NEGATIVE
Protein, UA: NEGATIVE
Spec Grav, UA: 1.015 (ref 1.010–1.025)
Urobilinogen, UA: 0.2 E.U./dL
pH, UA: 5.5 (ref 5.0–8.0)

## 2019-09-07 LAB — POCT UA - MICROALBUMIN
Albumin/Creatinine Ratio, Urine, POC: 30
Creatinine, POC: 200 mg/dL
Microalbumin Ur, POC: 30 mg/L

## 2019-09-07 NOTE — Progress Notes (Signed)
Subjective:     Patient ID: Brittany Crawford , female    DOB: 14-Oct-1955 , 63 y.o.   MRN: [id number]   Chief Complaint  Patient presents with  . Annual Exam   The patient states she is post menopausal status  Mammogram last done 05/19/2019 Negative for: breast discharge, breast lump(s), breast pain and breast self exam.  Pertinent negatives include abnormal bleeding (hematology), anxiety, decreased libido, depression, difficulty falling sleep, dyspareunia, history of infertility, nocturia, sexual dysfunction, sleep disturbances, urinary incontinence, urinary urgency, vaginal discharge and vaginal itching. Diet regular.The patient states her exercise level is  2  times per week.     The patient's tobacco use is:  Social History   Tobacco Use  Smoking Status Former Smoker  . Packs/day: 0.00  . Years: 0.00  . Pack years: 0.00  . Types: Cigarettes  . Quit date: 03/13/2017  . Years since quitting: 2.4  Smokeless Tobacco Never Used  Tobacco Comment   4  to 5  cigarettes daily for years   She has been exposed to passive smoke. The patient's alcohol use is:  Social History   Substance and Sexual Activity  Alcohol Use Not Currently   Comment: occassionally  Additional information: Last pap reported 3 years ago by patient, PAP to be done today   HPI  Here for HM with PAP  Hypertension This is a chronic problem. The current episode started more than 1 year ago. The problem is unchanged. The problem is controlled. Pertinent negatives include no anxiety, chest pain, headaches or palpitations. Past treatments include angiotensin blockers. There are no compliance problems.  There is no history of angina. There is no history of chronic renal disease.     Past Medical History:  Diagnosis Date  . Chronic back pain   . H/O sarcoidosis   . Hypertension   . Seasonal allergies   . Stroke [facility])      Family History  Problem Relation Age of Onset  . Colon cancer Maternal Uncle   . Stroke  Mother      Current Outpatient Medications:  .  Ascorbic Acid (VITAMIN C) 1000 MG tablet, Take 1,000 mg by mouth daily. Takes when coming down with a cold., Disp: , Rfl:  .  aspirin 325 MG tablet, Take 1 tablet (325 mg total) by mouth daily. (Patient taking differently: Take 81 mg by mouth daily. ), Disp: 30 tablet, Rfl: 3 .  diclofenac sodium (VOLTAREN) 1 % GEL, Apply 2 g topically 4 (four) times daily., Disp: 300 g, Rfl: 2 .  HYDROcodone-acetaminophen (NORCO) 7.5-325 MG tablet, Take 1 tablet by mouth every 8 (eight) hours as needed for moderate pain., Disp: 85 tablet, Rfl: 0 .  Hypromellose (ARTIFICIAL TEARS OP), Place 1 drop into the right eye daily as needed (for dry eyes)., Disp: , Rfl:  .  losartan-hydrochlorothiazide (HYZAAR) 100-12.5 MG tablet, TAKE 1 TABLET EVERY DAY, Disp: 90 tablet, Rfl: 1 .  Multiple Vitamin (MULTIVITAMIN WITH MINERALS) TABS tablet, Take 1 tablet by mouth daily., Disp: , Rfl:  .  Olopatadine HCl 0.2 % SOLN, Apply 1 drop to eye daily as needed for allergies., Disp: , Rfl: 3 .  prednisoLONE acetate (PRED FORTE) 1 % ophthalmic suspension, Place 1 drop into the left eye as needed (flare ups). , Disp: , Rfl:  .  triamcinolone cream (KENALOG) 0.1 %, Apply 1 application topically at bedtime as needed (for rash)., Disp: 30 g, Rfl: 0   Allergies  Allergen Reactions  .  Gadobenate Nausea And Vomiting    Pt was fine after a few minutes , vomiting after contrast injection smills      Review of Systems  Constitutional: Negative.  Negative for fever.  HENT: Negative.   Eyes: Negative.   Respiratory: Negative.   Cardiovascular: Negative for chest pain, palpitations and leg swelling.  Gastrointestinal: Negative.   Endocrine: Negative.  Negative for polydipsia, polyphagia and polyuria.  Genitourinary: Negative.   Musculoskeletal: Negative.   Skin: Negative.   Allergic/Immunologic: Negative.   Neurological: Negative.  Negative for dizziness and headaches.  Hematological:  Negative.   Psychiatric/Behavioral: Negative.      Today's Vitals   09/07/19 0935  BP: 138/82  Pulse: 78  Temp: 98.9 F (37.2 C)  TempSrc: Oral  Weight: 203 lb 12.8 oz (92.4 kg)  Height: 5' 7.5" (1.715 m)   Body mass index is 31.45 kg/m.   Objective:  Physical Exam Vitals signs reviewed.  Constitutional:      Appearance: She is well-developed.  HENT:     Head: Normocephalic and atraumatic.     Right Ear: Hearing, tympanic membrane, ear canal and external ear normal.     Left Ear: Hearing, tympanic membrane, ear canal and external ear normal.     Nose: Nose normal.  Eyes:     General: Lids are normal.     Conjunctiva/sclera: Conjunctivae normal.     Pupils: Pupils are equal, round, and reactive to light.     Funduscopic exam:    Right eye: No papilledema.        Left eye: No papilledema.  Neck:     Musculoskeletal: Full passive range of motion without pain, normal range of motion and neck supple.     Thyroid: No thyroid mass.     Vascular: No carotid bruit.  Cardiovascular:     Rate and Rhythm: Normal rate and regular rhythm.     Pulses: Normal pulses.     Heart sounds: Normal heart sounds. No murmur.  Pulmonary:     Effort: Pulmonary effort is normal.     Breath sounds: Normal breath sounds.  Abdominal:     General: Bowel sounds are normal.     Palpations: Abdomen is soft.  Genitourinary:    Labia:        Right: No rash or tenderness.        Left: No rash or tenderness.      Vagina: Normal.     Cervix: Normal.     Uterus: Normal.      Adnexa: Right adnexa normal and left adnexa normal.     Rectum: Normal. Guaiac result negative.  Musculoskeletal: Normal range of motion.  Skin:    General: Skin is warm and dry.     Capillary Refill: Capillary refill takes less than 2 seconds.  Neurological:     Mental Status: She is alert and oriented to person, place, and time.     Cranial Nerves: No cranial nerve deficit.     Sensory: No sensory deficit.  Psychiatric:         Behavior: Behavior normal.        Thought Content: Thought content normal.        Judgment: Judgment normal.         Assessment And Plan:   1. Health maintenance examination . Behavior modifications discussed and diet history reviewed.   . Pt will continue to exercise regularly and modify diet with low GI, plant based foods and decrease intake of  processed foods.  . Recommend intake of daily multivitamin, Vitamin D, and calcium.  . Recommend mammogram (05/19/2019) for preventive screenings, as well as recommend immunizations that include influenza, TDAP   2. Essential hypertension . B/P is controlled.  . CMP ordered to check renal function.  . The importance of regular exercise and dietary modification was stressed to the patient.  . Stressed importance of losing ten percent of her body weight to help with B/P control.  - EKG 12-Lead - POCT Urinalysis Dipstick (81002) - CMP14+EGFR - POCT UA - Microalbumin  3. Need for influenza vaccination  Influenza vaccine given in office  Advised to take Tylenol as needed for muscle aches or fever - Flu Vaccine QUAD 6+ mos PF IM (Fluarix Quad PF)  4. Benign hypertension with CKD (chronic kidney disease), stage II  Chronic, good control  Continue with current medications  5. Encounter for Papanicolaou smear of cervix  PAP done no abnormal findings - Cytology -Pap Smear  6. Mixed hyperlipidemia  Chronic, controlled  Continue with current medications - Lipid Profile  7. Spondylosis of lumbar region without myelopathy or radiculopathy  Chronic, continue with pain management - Vitamin D (25 hydroxy)  8. Other long term (current) drug therapy  - TSH  9. Screening examination for STD (sexually transmitted disease)  - Cervicovaginal ancillary only    [name], FNP

## 2019-09-08 LAB — CMP14+EGFR
ALT: 9 IU/L (ref 0–32)
AST: 13 IU/L (ref 0–40)
Albumin/Globulin Ratio: 1.5 (ref 1.2–2.2)
Albumin: 4.5 g/dL (ref 3.8–4.9)
Alkaline Phosphatase: 67 IU/L (ref 39–117)
BUN/Creatinine Ratio: 12 (ref 12–28)
BUN: 12 mg/dL (ref 8–27)
Bilirubin Total: 0.8 mg/dL (ref 0.0–1.2)
CO2: 23 mmol/L (ref 20–29)
Calcium: 10 mg/dL (ref 8.7–10.3)
Chloride: 101 mmol/L (ref 96–106)
Creatinine, Ser: 0.99 mg/dL (ref 0.57–1.00)
GFR calc Af Amer: 72 mL/min/{1.73_m2} (ref >59)
GFR calc non Af Amer: 62 mL/min/{1.73_m2} (ref >59)
Globulin, Total: 3.1 g/dL (ref 1.5–4.5)
Glucose: 104 mg/dL — ABNORMAL HIGH (ref 65–99)
Potassium: 4 mmol/L (ref 3.5–5.2)
Sodium: 138 mmol/L (ref 134–144)
Total Protein: 7.6 g/dL (ref 6.0–8.5)

## 2019-09-08 LAB — LIPID PANEL
Chol/HDL Ratio: 3.7 ratio (ref 0.0–4.4)
Cholesterol, Total: 207 mg/dL — ABNORMAL HIGH (ref 100–199)
HDL: 56 mg/dL (ref >39)
LDL Chol Calc (NIH): 123 mg/dL — ABNORMAL HIGH (ref 0–99)
Triglycerides: 158 mg/dL — ABNORMAL HIGH (ref 0–149)
VLDL Cholesterol Cal: 28 mg/dL (ref 5–40)

## 2019-09-08 LAB — TSH: TSH: 1.25 u[IU]/mL (ref 0.450–4.500)

## 2019-09-08 LAB — VITAMIN D 25 HYDROXY (VIT D DEFICIENCY, FRACTURES): Vit D, 25-Hydroxy: 26.1 ng/mL — ABNORMAL LOW (ref 30.0–100.0)

## 2019-09-12 ENCOUNTER — Other Ambulatory Visit: Payer: Self-pay | Admitting: Adult Health

## 2019-09-12 ENCOUNTER — Telehealth: Payer: Self-pay | Admitting: *Deleted

## 2019-09-12 DIAGNOSIS — M5481 Occipital neuralgia: Secondary | ICD-10-CM

## 2019-09-12 DIAGNOSIS — R519 Headache, unspecified: Secondary | ICD-10-CM

## 2019-09-12 DIAGNOSIS — M542 Cervicalgia: Secondary | ICD-10-CM

## 2019-09-12 NOTE — Telephone Encounter (Signed)
LVM requesting call back for MRI results. 

## 2019-09-12 NOTE — Patient Instructions (Signed)
Health Maintenance  Topic Date Due  . TETANUS/TDAP  09/08/1977  . MAMMOGRAM  05/18/2021  . COLONOSCOPY  07/05/2022  . PAP SMEAR-Modifier  09/06/2022  . INFLUENZA VACCINE  Completed  . Hepatitis C Screening  Completed  . HIV Screening  Completed   Health Maintenance, Female Adopting a healthy lifestyle and getting preventive care are important in promoting health and wellness. Ask your health care provider about:  The right schedule for you to have regular tests and exams.  Things you can do on your own to prevent diseases and keep yourself healthy. What should I know about diet, weight, and exercise? Eat a healthy diet   Eat a diet that includes plenty of vegetables, fruits, low-fat dairy products, and lean protein.  Do not eat a lot of foods that are high in solid fats, added sugars, or sodium. Maintain a healthy weight Body mass index (BMI) is used to identify weight problems. It estimates body fat based on height and weight. Your health care provider can help determine your BMI and help you achieve or maintain a healthy weight. Get regular exercise Get regular exercise. This is one of the most important things you can do for your health. Most adults should:  Exercise for at least 150 minutes each week. The exercise should increase your heart rate and make you sweat (moderate-intensity exercise).  Do strengthening exercises at least twice a week. This is in addition to the moderate-intensity exercise.  Spend less time sitting. Even light physical activity can be beneficial. Watch cholesterol and blood lipids Have your blood tested for lipids and cholesterol at 61 years of age, then have this test every 5 years. Have your cholesterol levels checked more often if:  Your lipid or cholesterol levels are high.  You are older than 61 years of age.  You are at high risk for heart disease. What should I know about cancer screening? Depending on your health history and family  history, you may need to have cancer screening at various ages. This may include screening for:  Breast cancer.  Cervical cancer.  Colorectal cancer.  Skin cancer.  Lung cancer. What should I know about heart disease, diabetes, and high blood pressure? Blood pressure and heart disease  High blood pressure causes heart disease and increases the risk of stroke. This is more likely to develop in people who have high blood pressure readings, are of African descent, or are overweight.  Have your blood pressure checked: ? Every 3-5 years if you are 25-26 years of age. ? Every year if you are 68 years old or older. Diabetes Have regular diabetes screenings. This checks your fasting blood sugar level. Have the screening done:  Once every three years after age 14 if you are at a normal weight and have a low risk for diabetes.  More often and at a younger age if you are overweight or have a high risk for diabetes. What should I know about preventing infection? Hepatitis B If you have a higher risk for hepatitis B, you should be screened for this virus. Talk with your health care provider to find out if you are at risk for hepatitis B infection. Hepatitis C Testing is recommended for:  Everyone born from [birth year] through 1963.  Anyone with known risk factors for hepatitis C. Sexually transmitted infections (STIs)  Get screened for STIs, including gonorrhea and chlamydia, if: ? You are sexually active and are younger than 61 years of age. ? You are older than  61 years of age and your health care provider tells you that you are at risk for this type of infection. ? Your sexual activity has changed since you were last screened, and you are at increased risk for chlamydia or gonorrhea. Ask your health care provider if you are at risk.  Ask your health care provider about whether you are at high risk for HIV. Your health care provider may recommend a prescription medicine to help prevent HIV  infection. If you choose to take medicine to prevent HIV, you should first get tested for HIV. You should then be tested every 3 months for as long as you are taking the medicine. Pregnancy  If you are about to stop having your period (premenopausal) and you may become pregnant, seek counseling before you get pregnant.  Take 400 to 800 micrograms (mcg) of folic acid every day if you become pregnant.  Ask for birth control (contraception) if you want to prevent pregnancy. Osteoporosis and menopause Osteoporosis is a disease in which the bones lose minerals and strength with aging. This can result in bone fractures. If you are 61 years old or older, or if you are at risk for osteoporosis and fractures, ask your health care provider if you should:  Be screened for bone loss.  Take a calcium or vitamin D supplement to lower your risk of fractures.  Be given hormone replacement therapy (HRT) to treat symptoms of menopause. Follow these instructions at home: Lifestyle  Do not use any products that contain nicotine or tobacco, such as cigarettes, e-cigarettes, and chewing tobacco. If you need help quitting, ask your health care provider.  Do not use street drugs.  Do not share needles.  Ask your health care provider for help if you need support or information about quitting drugs. Alcohol use  Do not drink alcohol if: ? Your health care provider tells you not to drink. ? You are pregnant, may be pregnant, or are planning to become pregnant.  If you drink alcohol: ? Limit how much you use to 0-1 drink a day. ? Limit intake if you are breastfeeding.  Be aware of how much alcohol is in your drink. In the [place] one drink equals one 12 oz bottle of beer (355 mL), one 5 oz glass of wine (148 mL), or one 1 oz glass of hard liquor (44 mL). General instructions  Schedule regular health, dental, and eye exams.  Stay current with your vaccines.  Tell your health care provider if: ? You  often feel depressed. ? You have ever been abused or do not feel safe at home. Summary  Adopting a healthy lifestyle and getting preventive care are important in promoting health and wellness.  Follow your health care provider's instructions about healthy diet, exercising, and getting tested or screened for diseases.  Follow your health care provider's instructions on monitoring your cholesterol and blood pressure. This information is not intended to replace advice given to you by your health care provider. Make sure you discuss any questions you have with your health care provider. Document Released: 04/14/2011 Document Revised: 09/22/2018 Document Reviewed: 09/22/2018 Elsevier Patient Education  2020 [employer].

## 2019-09-12 NOTE — Telephone Encounter (Signed)
Patient returned call. I informed her that the recent imaging did show moderate spinal narrowing (stenosis) as well as bone spurs. Janett Billow placed a referral for orthopedics for further evaluation.  Patient verbalized understanding, appreciation.

## 2019-09-13 LAB — CERVICOVAGINAL ANCILLARY ONLY
Bacterial Vaginitis (gardnerella): POSITIVE — AB
Candida Glabrata: NEGATIVE
Candida Vaginitis: NEGATIVE
Chlamydia: NEGATIVE
Comment: NEGATIVE
Comment: NEGATIVE
Comment: NEGATIVE
Comment: NEGATIVE
Comment: NEGATIVE
Comment: NORMAL
Neisseria Gonorrhea: NEGATIVE
Trichomonas: NEGATIVE

## 2019-09-13 LAB — CYTOLOGY - PAP: Diagnosis: NEGATIVE

## 2019-09-14 ENCOUNTER — Encounter: Payer: Self-pay | Admitting: Registered Nurse

## 2019-09-14 ENCOUNTER — Encounter: Payer: Medicare HMO | Attending: Registered Nurse | Admitting: Registered Nurse

## 2019-09-14 ENCOUNTER — Other Ambulatory Visit: Payer: Self-pay

## 2019-09-14 VITALS — BP 144/87 | HR 85 | Temp 97.3°F | Ht 67.5 in | Wt 203.0 lb

## 2019-09-14 DIAGNOSIS — M129 Arthropathy, unspecified: Secondary | ICD-10-CM | POA: Diagnosis not present

## 2019-09-14 DIAGNOSIS — M7062 Trochanteric bursitis, left hip: Secondary | ICD-10-CM | POA: Diagnosis not present

## 2019-09-14 DIAGNOSIS — Z79899 Other long term (current) drug therapy: Secondary | ICD-10-CM | POA: Diagnosis not present

## 2019-09-14 DIAGNOSIS — M5136 Other intervertebral disc degeneration, lumbar region: Secondary | ICD-10-CM | POA: Insufficient documentation

## 2019-09-14 DIAGNOSIS — I10 Essential (primary) hypertension: Secondary | ICD-10-CM | POA: Insufficient documentation

## 2019-09-14 DIAGNOSIS — M62830 Muscle spasm of back: Secondary | ICD-10-CM

## 2019-09-14 DIAGNOSIS — Z800 Family history of malignant neoplasm of digestive organs: Secondary | ICD-10-CM | POA: Diagnosis not present

## 2019-09-14 DIAGNOSIS — Z87891 Personal history of nicotine dependence: Secondary | ICD-10-CM | POA: Insufficient documentation

## 2019-09-14 DIAGNOSIS — M47816 Spondylosis without myelopathy or radiculopathy, lumbar region: Secondary | ICD-10-CM

## 2019-09-14 DIAGNOSIS — M25561 Pain in right knee: Secondary | ICD-10-CM | POA: Insufficient documentation

## 2019-09-14 DIAGNOSIS — Z5181 Encounter for therapeutic drug level monitoring: Secondary | ICD-10-CM | POA: Diagnosis not present

## 2019-09-14 DIAGNOSIS — G894 Chronic pain syndrome: Secondary | ICD-10-CM

## 2019-09-14 DIAGNOSIS — Z760 Encounter for issue of repeat prescription: Secondary | ICD-10-CM | POA: Insufficient documentation

## 2019-09-14 DIAGNOSIS — M7061 Trochanteric bursitis, right hip: Secondary | ICD-10-CM | POA: Diagnosis not present

## 2019-09-14 DIAGNOSIS — M25562 Pain in left knee: Secondary | ICD-10-CM | POA: Diagnosis not present

## 2019-09-14 DIAGNOSIS — Z79891 Long term (current) use of opiate analgesic: Secondary | ICD-10-CM | POA: Diagnosis not present

## 2019-09-14 DIAGNOSIS — M542 Cervicalgia: Secondary | ICD-10-CM

## 2019-09-14 MED ORDER — HYDROCODONE-ACETAMINOPHEN 7.5-325 MG PO TABS: 1.0000 | ORAL_TABLET | Freq: Three times a day (TID) | ORAL | 0 refills | Status: DC | PRN

## 2019-09-14 NOTE — Progress Notes (Signed)
Subjective:    Patient ID: Brittany Crawford, female    DOB: December 06, 1955, 63 y.o.   MRN: [id number]  HPI: Brittany Crawford is a 63 y.o. female who returns for follow up appointment for chronic pain and medication refill. She states her pain is located in her neck and lower back pain. She rates her pain 7. Her  current exercise regime is walking and performing stretching exercises.  Brittany Crawford Morphine equivalent is 21.98  MME.  UDS was Performed Today.    Pain Inventory Average Pain 7 Pain Right Now 7 My pain is constant  In the last 24 hours, has pain interfered with the following? General activity 7 Relation with others 9 Enjoyment of life 9 What TIME of day is your pain at its worst? evening Sleep (in general) Fair  Pain is worse with: bending and standing Pain improves with: rest, heat/ice, therapy/exercise and medication Relief from Meds: 10  Mobility walk with assistance use a cane ability to climb steps?  yes do you drive?  no  Function Do you have any goals in this area?  yes  Neuro/Psych No problems in this area  Prior Studies Any changes since last visit?  no  Physicians involved in your care Any changes since last visit?  no   Family History  Problem Relation Age of Onset  . Colon cancer Maternal Uncle   . Stroke Mother    Social History   Socioeconomic History  . Marital status: Married    Spouse name: Not on file  . Number of children: 2  . Years of education: 86  . Highest education level: Not on file  Occupational History  . Occupation: disability  Social Needs  . Financial resource strain: Not hard at all  . Food insecurity    Worry: Never true    Inability: Never true  . Transportation needs    Medical: No    Non-medical: No  Tobacco Use  . Smoking status: Former Smoker    Packs/day: 0.00    Years: 0.00    Pack years: 0.00    Types: Cigarettes    Quit date: 03/13/2017    Years since quitting: 2.5  . Smokeless tobacco: Never  Used  . Tobacco comment: 4  to 5  cigarettes daily for years  Substance and Sexual Activity  . Alcohol use: Not Currently    Comment: occassionally  . Drug use: Yes    Types: Hydrocodone  . Sexual activity: Yes  Lifestyle  . Physical activity    Days per week: 2 days    Minutes per session: 10 min  . Stress: Not at all  Relationships  . Social Herbalist on phone: Not on file    Gets together: Not on file    Attends religious service: Not on file    Active member of club or organization: Not on file    Attends meetings of clubs or organizations: Not on file    Relationship status: Not on file  Other Topics Concern  . Not on file  Social History Narrative   Patient is married with 2 children.   Patient is right handed.   Patient has hs education.   Patient drinks 4 cups daily.   Past Surgical History:  Procedure Laterality Date  . BREAST BIOPSY Left 04/29/2016  . BREAST BIOPSY Left 04/23/2016  . DILATATION & CURRETTAGE/HYSTEROSCOPY WITH RESECTOCOPE N/A 12/10/2012   Procedure: DILATATION & CURETTAGE/HYSTEROSCOPY WITH RESECTOCOPE;  Surgeon:  Marvene Staff, MD;  Location: [facility];  Service: Gynecology;  Laterality: N/A;  . FOOT SURGERY     left-pins placed  . LYMPH NODE BIOPSY    . POLYPECTOMY N/A 12/10/2012   Procedure: POLYPECTOMY;  Surgeon: Marvene Staff, MD;  Location: [facility];  Service: Gynecology;  Laterality: N/A;  . STRABISMUS SURGERY Left 04/17/2017   Procedure: REPAIR STRABISMUS LEFT EYE;  Surgeon: Everitt Amber, MD;  Location: [facility];  Service: Ophthalmology;  Laterality: Left;   Past Medical History:  Diagnosis Date  . Chronic back pain   . H/O sarcoidosis   . Hypertension   . Seasonal allergies   . Stroke ([place])    BP (!) 144/87   Pulse 85   Temp (!) 97.3 F (36.3 C)   Ht 5' 7.5" (1.715 m)   Wt 203 lb (92.1 kg)   LMP 11/13/2012   SpO2 96%   BMI 31.33 kg/m   Opioid Risk Score:   Fall Risk Score:  `1   Depression screen PHQ 2/9  Depression screen [facility] 2/9 07/19/2019 04/13/2019 03/24/2019 02/07/2019 01/26/2019 01/25/2019 09/24/2018  Decreased Interest 0 0 0 0 0 0 0  Down, Depressed, Hopeless 0 0 0 0 0 0 0  PHQ - 2 Score 0 0 0 0 0 0 0  Altered sleeping - 0 - - - - -  Tired, decreased energy - 0 - - - - -  Change in appetite - 0 - - - - -  Feeling bad or failure about yourself  - 0 - - - - -  Trouble concentrating - 0 - - - - -  Moving slowly or fidgety/restless - 0 - - - - -  Suicidal thoughts - 0 - - - - -  PHQ-9 Score - 0 - - - - -    Review of Systems  Constitutional: Negative.   HENT: Negative.   Eyes: Negative.   Respiratory: Negative.   Cardiovascular: Negative.   Gastrointestinal: Negative.   Endocrine: Negative.   Genitourinary: Negative.   Musculoskeletal: Positive for back pain.  Skin: Negative.   Allergic/Immunologic: Negative.   Neurological: Negative.   Hematological: Negative.   Psychiatric/Behavioral: Negative.   All other systems reviewed and are negative.      Objective:   Physical Exam Vitals signs and nursing note reviewed.  Constitutional:      Appearance: Normal appearance.  Neck:     Musculoskeletal: Normal range of motion and neck supple.     Comments: Cervical Paraspinal Tenderness: C-5-C-6 Cardiovascular:     Rate and Rhythm: Normal rate and regular rhythm.     Pulses: Normal pulses.     Heart sounds: Normal heart sounds.  Musculoskeletal:     Comments: Normal Muscle Bulk and Muscle Testing Reveals:  Upper Extremities: Full ROM and Muscle Strength 5/5 Bilateral AC Joint Tenderness Bilateral Greater Trochanter Tenderness Lower Extremities: Full ROM and Muscle Strength 5/5 Arises from Table with ease Narrow Based  Gait   Skin:    General: Skin is warm and dry.  Neurological:     Mental Status: She is alert and oriented to person, place, and time.  Psychiatric:        Mood and Affect: Mood normal.        Behavior: Behavior normal.            Assessment & Plan:  1. Lumbar spondylosis with DDD and facet arthropathy.12//11/2018 Refilled:Hydrocodone 7.5 /325 mg one tablet every 8 hours as  needed for pain #85.Second scripte-scribefor the following month. We will continue the opioid monitoring program, this consists of regular clinic visits, examinations, urine drug screen, pill counts as well as use of [place] Controlled Substance Reporting System. 2. Bilateral knee pain: No complaints voiced today:Continue with heat/ice, exercise and Diclofenac.09/14/2019 3. Right thalamic/internal capsule lacunar infarct with persistent left hemisensory deficits: Neurology Following. Continue to Monitor.09/14/2019 4.Bilateral Greater Trochanteric Bursitis:Continue HEP as Tolerated and Alternate Ice and Heat Therapy. Continue current medication regime. Continue to Monitor. 09/14/2019  69minutes of face to face patient care time was spent during this visit. All questions were encouraged and answered.   F/U in65months

## 2019-09-18 LAB — TOXASSURE SELECT,+ANTIDEPR,UR

## 2019-09-19 ENCOUNTER — Other Ambulatory Visit: Payer: Self-pay | Admitting: Nurse Practitioner

## 2019-09-19 DIAGNOSIS — B9689 Other specified bacterial agents as the cause of diseases classified elsewhere: Secondary | ICD-10-CM

## 2019-09-19 DIAGNOSIS — N760 Acute vaginitis: Secondary | ICD-10-CM

## 2019-09-19 MED ORDER — METRONIDAZOLE 500 MG PO TABS: 500.0000 mg | ORAL_TABLET | Freq: Three times a day (TID) | ORAL | 0 refills | Status: AC

## 2019-09-20 ENCOUNTER — Telehealth: Payer: Self-pay | Admitting: *Deleted

## 2019-09-20 NOTE — Telephone Encounter (Signed)
Urine drug screen for this encounter is consistent for prescribed medication 

## 2019-10-19 ENCOUNTER — Telehealth: Payer: Self-pay | Admitting: Adult Health

## 2019-10-19 NOTE — Telephone Encounter (Signed)
Brittany Crawford, NT  [facility] R  Pt has failed to respond to vms to schedule appt Closing referral.

## 2019-11-15 ENCOUNTER — Ambulatory Visit: Payer: Medicare HMO | Admitting: Registered Nurse

## 2019-11-16 ENCOUNTER — Encounter: Payer: Self-pay | Admitting: Registered Nurse

## 2019-11-16 ENCOUNTER — Encounter: Payer: Medicare HMO | Attending: Registered Nurse | Admitting: Registered Nurse

## 2019-11-16 ENCOUNTER — Other Ambulatory Visit: Payer: Self-pay

## 2019-11-16 VITALS — BP 129/78 | HR 85 | Temp 98.1°F | Ht 67.5 in | Wt 206.0 lb

## 2019-11-16 DIAGNOSIS — I10 Essential (primary) hypertension: Secondary | ICD-10-CM | POA: Diagnosis not present

## 2019-11-16 DIAGNOSIS — M5136 Other intervertebral disc degeneration, lumbar region: Secondary | ICD-10-CM | POA: Diagnosis not present

## 2019-11-16 DIAGNOSIS — Z79891 Long term (current) use of opiate analgesic: Secondary | ICD-10-CM | POA: Diagnosis not present

## 2019-11-16 DIAGNOSIS — Z79899 Other long term (current) drug therapy: Secondary | ICD-10-CM | POA: Diagnosis not present

## 2019-11-16 DIAGNOSIS — M47816 Spondylosis without myelopathy or radiculopathy, lumbar region: Secondary | ICD-10-CM | POA: Diagnosis not present

## 2019-11-16 DIAGNOSIS — M129 Arthropathy, unspecified: Secondary | ICD-10-CM | POA: Insufficient documentation

## 2019-11-16 DIAGNOSIS — M7062 Trochanteric bursitis, left hip: Secondary | ICD-10-CM | POA: Diagnosis not present

## 2019-11-16 DIAGNOSIS — M7061 Trochanteric bursitis, right hip: Secondary | ICD-10-CM | POA: Diagnosis not present

## 2019-11-16 DIAGNOSIS — G894 Chronic pain syndrome: Secondary | ICD-10-CM | POA: Diagnosis not present

## 2019-11-16 DIAGNOSIS — M62830 Muscle spasm of back: Secondary | ICD-10-CM

## 2019-11-16 DIAGNOSIS — Z87891 Personal history of nicotine dependence: Secondary | ICD-10-CM | POA: Insufficient documentation

## 2019-11-16 DIAGNOSIS — Z5181 Encounter for therapeutic drug level monitoring: Secondary | ICD-10-CM

## 2019-11-16 DIAGNOSIS — Z760 Encounter for issue of repeat prescription: Secondary | ICD-10-CM | POA: Insufficient documentation

## 2019-11-16 DIAGNOSIS — M25562 Pain in left knee: Secondary | ICD-10-CM | POA: Insufficient documentation

## 2019-11-16 DIAGNOSIS — Z800 Family history of malignant neoplasm of digestive organs: Secondary | ICD-10-CM | POA: Insufficient documentation

## 2019-11-16 DIAGNOSIS — M25561 Pain in right knee: Secondary | ICD-10-CM | POA: Insufficient documentation

## 2019-11-16 MED ORDER — HYDROCODONE-ACETAMINOPHEN 7.5-325 MG PO TABS: 1.0000 | ORAL_TABLET | Freq: Three times a day (TID) | ORAL | 0 refills | Status: DC | PRN

## 2019-11-16 NOTE — Progress Notes (Signed)
Subjective:    Patient ID: Brittany Crawford, female    DOB: April 30, 1956, 64 y.o.   MRN: [id number]  HPI: Brittany Crawford is a 64 y.o. female who returns for follow up appointment for chronic pain and medication refill. She states her pain is located in her lower back and reports generalized pain all over. She rates her pain 4. Her current exercise regime is walking and performing stretching exercises.  Ms. Wilsey Morphine equivalent is 22.77 MME.    Last UDS was Performed on 09/14/2019, it was consistent.    Pain Inventory Average Pain 4 Pain Right Now 4 My pain is constant, sharp and aching  In the last 24 hours, has pain interfered with the following? General activity 7 Relation with others 9 Enjoyment of life 9 What TIME of day is your pain at its worst? morning, evening  Sleep (in general) Good  Pain is worse with: walking and standing Pain improves with: rest, heat/ice and medication Relief from Meds: 9  Mobility walk with assistance use a cane ability to climb steps?  yes do you drive?  no Do you have any goals in this area?  yes  Function disabled: date disabled . Do you have any goals in this area?  yes  Neuro/Psych No problems in this area  Prior Studies Any changes since last visit?  no  Physicians involved in your care Any changes since last visit?  no   Family History  Problem Relation Age of Onset  . Colon cancer Maternal Uncle   . Stroke Mother    Social History   Socioeconomic History  . Marital status: Married    Spouse name: Not on file  . Number of children: 2  . Years of education: 36  . Highest education level: Not on file  Occupational History  . Occupation: disability  Tobacco Use  . Smoking status: Former Smoker    Packs/day: 0.00    Years: 0.00    Pack years: 0.00    Types: Cigarettes    Quit date: 03/13/2017    Years since quitting: 2.6  . Smokeless tobacco: Never Used  . Tobacco comment: 4  to 5  cigarettes daily  for years  Substance and Sexual Activity  . Alcohol use: Not Currently    Comment: occassionally  . Drug use: Yes    Types: Hydrocodone  . Sexual activity: Yes  Other Topics Concern  . Not on file  Social History Narrative   Patient is married with 2 children.   Patient is right handed.   Patient has hs education.   Patient drinks 4 cups daily.   Social Determinants of Health   Financial Resource Strain: Low Risk   . Difficulty of Paying Living Expenses: Not hard at all  Food Insecurity: No Food Insecurity  . Worried About Charity fundraiser in the Last Year: Never true  . Ran Out of Food in the Last Year: Never true  Transportation Needs: No Transportation Needs  . Lack of Transportation (Medical): No  . Lack of Transportation (Non-Medical): No  Physical Activity: Insufficiently Active  . Days of Exercise per Week: 2 days  . Minutes of Exercise per Session: 10 min  Stress: No Stress Concern Present  . Feeling of Stress : Not at all  Social Connections:   . Frequency of Communication with Friends and Family: Not on file  . Frequency of Social Gatherings with Friends and Family: Not on file  . Attends Religious  Services: Not on file  . Active Member of Clubs or Organizations: Not on file  . Attends Archivist Meetings: Not on file  . Marital Status: Not on file   Past Surgical History:  Procedure Laterality Date  . BREAST BIOPSY Left 04/29/2016  . BREAST BIOPSY Left 04/23/2016  . DILATATION & CURRETTAGE/HYSTEROSCOPY WITH RESECTOCOPE N/A 12/10/2012   Procedure: DILATATION & CURETTAGE/HYSTEROSCOPY WITH RESECTOCOPE;  Surgeon: Marvene Staff, MD;  Location: [facility];  Service: Gynecology;  Laterality: N/A;  . FOOT SURGERY     left-pins placed  . LYMPH NODE BIOPSY    . POLYPECTOMY N/A 12/10/2012   Procedure: POLYPECTOMY;  Surgeon: Marvene Staff, MD;  Location: [facility];  Service: Gynecology;  Laterality: N/A;  . STRABISMUS SURGERY Left 04/17/2017    Procedure: REPAIR STRABISMUS LEFT EYE;  Surgeon: Everitt Amber, MD;  Location: [facility];  Service: Ophthalmology;  Laterality: Left;   Past Medical History:  Diagnosis Date  . Chronic back pain   . H/O sarcoidosis   . Hypertension   . Seasonal allergies   . Stroke ([place])    Temp 98.1 F (36.7 C)   Ht 5' 7.5" (1.715 m)   Wt 206 lb (93.4 kg)   LMP 11/13/2012   BMI 31.79 kg/m   Opioid Risk Score:   Fall Risk Score:  `1  Depression screen PHQ 2/9  Depression screen [facility] 2/9 07/19/2019 04/13/2019 03/24/2019 02/07/2019 01/26/2019 01/25/2019 09/24/2018  Decreased Interest 0 0 0 0 0 0 0  Down, Depressed, Hopeless 0 0 0 0 0 0 0  PHQ - 2 Score 0 0 0 0 0 0 0  Altered sleeping - 0 - - - - -  Tired, decreased energy - 0 - - - - -  Change in appetite - 0 - - - - -  Feeling bad or failure about yourself  - 0 - - - - -  Trouble concentrating - 0 - - - - -  Moving slowly or fidgety/restless - 0 - - - - -  Suicidal thoughts - 0 - - - - -  PHQ-9 Score - 0 - - - - -    Review of Systems  Constitutional: Negative.   HENT: Negative.   Eyes: Negative.   Respiratory: Negative.   Cardiovascular: Negative.   Gastrointestinal: Negative.   Endocrine: Negative.   Genitourinary: Negative.   Musculoskeletal: Positive for back pain.  Skin: Negative.   Allergic/Immunologic: Negative.   Hematological: Negative.   Psychiatric/Behavioral: Negative.   All other systems reviewed and are negative.      Objective:   Physical Exam Vitals and nursing note reviewed.  Constitutional:      Appearance: Normal appearance.  Cardiovascular:     Rate and Rhythm: Normal rate and regular rhythm.     Pulses: Normal pulses.     Heart sounds: Normal heart sounds.  Pulmonary:     Effort: Pulmonary effort is normal.     Breath sounds: Normal breath sounds.  Musculoskeletal:     Cervical back: Normal range of motion and neck supple.     Comments: Normal Muscle Bulk and Muscle Testing Reveals:   Upper Extremities: Full ROM and Muscle Strength 5/5 Lumbar Paraspinal Tenderness: L-3-L-5 Lower Extremities: Full ROM and Muscle Strength 5/5 Arises from Table with ease Narrow Based  Gait   Skin:    General: Skin is warm and dry.  Neurological:     Mental Status: She is alert and oriented to  person, place, and time.  Psychiatric:        Mood and Affect: Mood normal.        Behavior: Behavior normal.           Assessment & Plan:  1. Lumbar spondylosis with DDD and facet arthropathy.02//12/2019 Refilled:Hydrocodone 7.5 /325 mg one tablet every 8 hours as needed for pain #85.Second scripte-scribefor the following month. We will continue the opioid monitoring program, this consists of regular clinic visits, examinations, urine drug screen, pill counts as well as use of [place] Controlled Substance Reporting System. 2. Bilateral knee pain: No complaints voiced today:Continue with heat/ice, exercise and Diclofenac.11/16/2019 3. Right thalamic/internal capsule lacunar infarct with persistent left hemisensory deficits:Neurology Following.Continue to Monitor.11/16/2019 4.BilateralGreater Trochanteric Bursitis:No complaints today.Continue HEP as Tolerated and Alternate Ice and Heat Therapy.Continue current medication regime.Continue to Monitor.11/16/2019.  93minutes of face to face patient care time was spent during this visit. All questions were encouraged and answered.   F/U in73months

## 2019-12-06 ENCOUNTER — Institutional Professional Consult (permissible substitution): Payer: Medicare HMO | Admitting: Physical Medicine and Rehabilitation

## 2019-12-07 ENCOUNTER — Encounter: Payer: Self-pay | Admitting: Physical Medicine and Rehabilitation

## 2019-12-07 ENCOUNTER — Ambulatory Visit (INDEPENDENT_AMBULATORY_CARE_PROVIDER_SITE_OTHER): Payer: Medicare HMO | Admitting: Physical Medicine and Rehabilitation

## 2019-12-07 ENCOUNTER — Other Ambulatory Visit: Payer: Self-pay

## 2019-12-07 VITALS — BP 137/96 | HR 97 | Ht 67.5 in | Wt 198.0 lb

## 2019-12-07 DIAGNOSIS — M47812 Spondylosis without myelopathy or radiculopathy, cervical region: Secondary | ICD-10-CM | POA: Insufficient documentation

## 2019-12-07 DIAGNOSIS — M5481 Occipital neuralgia: Secondary | ICD-10-CM

## 2019-12-07 DIAGNOSIS — M542 Cervicalgia: Secondary | ICD-10-CM | POA: Diagnosis not present

## 2019-12-07 DIAGNOSIS — F1190 Opioid use, unspecified, uncomplicated: Secondary | ICD-10-CM

## 2019-12-07 DIAGNOSIS — G894 Chronic pain syndrome: Secondary | ICD-10-CM | POA: Diagnosis not present

## 2019-12-07 NOTE — Progress Notes (Signed)
Pt states pain in the of head. Pt states pain started over a year ago. Pt states holding head down and sleeping on her belly creates a sharp pain in her head and lifting head up too quick makes pain worse. Pt also states when pain is present it is quick and sharp. Stretching, pain medication, and heating pad helps with pain.   .Numeric Pain Rating Scale and Functional Assessment Average Pain 5 Pain Right Now 2 My pain is intermittent, sharp and stabbing Pain is worse with: sleeping on belly Pain improves with: heat/ice, therapy/exercise and medication   In the last MONTH (on 0-10 scale) has pain interfered with the following?  1. General activity like being  able to carry out your everyday physical activities such as walking, climbing stairs, carrying groceries, or moving a chair?  Rating(6)  2. Relation with others like being able to carry out your usual social activities and roles such as  activities at home, at work and in your community. Rating(6)  3. Enjoyment of life such that you have  been bothered by emotional problems such as feeling anxious, depressed or irritable?  Rating(4)

## 2020-01-05 ENCOUNTER — Ambulatory Visit (INDEPENDENT_AMBULATORY_CARE_PROVIDER_SITE_OTHER): Payer: Medicare HMO | Admitting: Nurse Practitioner

## 2020-01-05 ENCOUNTER — Other Ambulatory Visit: Payer: Self-pay

## 2020-01-05 ENCOUNTER — Encounter: Payer: Self-pay | Admitting: Nurse Practitioner

## 2020-01-05 ENCOUNTER — Other Ambulatory Visit (HOSPITAL_COMMUNITY)
Admission: RE | Admit: 2020-01-05 | Discharge: 2020-01-05 | Disposition: A | Discharge status: Home / Self Care | Payer: Medicare HMO | Source: Ambulatory Visit | Attending: Nurse Practitioner | Admitting: Nurse Practitioner

## 2020-01-05 VITALS — BP 122/80 | HR 85 | Temp 98.7°F | Ht 67.5 in | Wt 210.2 lb

## 2020-01-05 DIAGNOSIS — R21 Rash and other nonspecific skin eruption: Secondary | ICD-10-CM | POA: Diagnosis not present

## 2020-01-05 DIAGNOSIS — R300 Dysuria: Secondary | ICD-10-CM | POA: Diagnosis not present

## 2020-01-05 DIAGNOSIS — R350 Frequency of micturition: Secondary | ICD-10-CM | POA: Insufficient documentation

## 2020-01-05 LAB — POCT URINALYSIS DIPSTICK
Bilirubin, UA: NEGATIVE
Glucose, UA: NEGATIVE
Ketones, UA: NEGATIVE
Nitrite, UA: NEGATIVE
Protein, UA: NEGATIVE
Spec Grav, UA: 1.025 (ref 1.010–1.025)
Urobilinogen, UA: 0.2 E.U./dL
pH, UA: 5.5 (ref 5.0–8.0)

## 2020-01-05 MED ORDER — TRIAMCINOLONE ACETONIDE 0.1 % EX CREA: 1.0000 "application " | TOPICAL_CREAM | Freq: Every evening | CUTANEOUS | 1 refills | Status: DC | PRN

## 2020-01-05 MED ORDER — NITROFURANTOIN MONOHYD MACRO 100 MG PO CAPS: 100.0000 mg | ORAL_CAPSULE | Freq: Two times a day (BID) | ORAL | 0 refills | Status: AC

## 2020-01-05 NOTE — Progress Notes (Signed)
This visit occurred during the SARS-CoV-2 public health emergency.  Safety protocols were in place, including screening questions prior to the visit, additional usage of staff PPE, and extensive cleaning of exam room while observing appropriate contact time as indicated for disinfecting solutions.  Subjective:     Patient ID: Brittany Crawford , female    DOB: 04/06/1956 , 64 y.o.   MRN: [id number]   Chief Complaint  Patient presents with  . Dysuria    patient stated she has had some lower adominal pain     HPI  She has had her 1st Covid vaccine already will have her second one on April 19th.    Dysuria  This is a new problem. The current episode started in the past 7 days (2-3 days ago). The problem occurs every urination. She is sexually active (uses lubricant). There is no history of pyelonephritis. Associated symptoms include frequency. Pertinent negatives include no chills, nausea or urgency.     Past Medical History:  Diagnosis Date  . Chronic back pain   . H/O sarcoidosis   . Hypertension   . Seasonal allergies   . Stroke [facility])      Family History  Problem Relation Age of Onset  . Colon cancer Maternal Uncle   . Stroke Mother      Current Outpatient Medications:  .  Ascorbic Acid (VITAMIN C) 1000 MG tablet, Take 1,000 mg by mouth daily. Takes when coming down with a cold., Disp: , Rfl:  .  aspirin 325 MG tablet, Take 1 tablet (325 mg total) by mouth daily. (Patient taking differently: Take 81 mg by mouth daily. ), Disp: 30 tablet, Rfl: 3 .  diclofenac sodium (VOLTAREN) 1 % GEL, Apply 2 g topically 4 (four) times daily., Disp: 300 g, Rfl: 2 .  HYDROcodone-acetaminophen (NORCO) 7.5-325 MG tablet, Take 1 tablet by mouth every 8 (eight) hours as needed for moderate pain., Disp: 85 tablet, Rfl: 0 .  Hypromellose (ARTIFICIAL TEARS OP), Place 1 drop into the right eye daily as needed (for dry eyes)., Disp: , Rfl:  .  losartan-hydrochlorothiazide (HYZAAR) 100-12.5 MG  tablet, TAKE 1 TABLET EVERY DAY, Disp: 90 tablet, Rfl: 1 .  Multiple Vitamin (MULTIVITAMIN WITH MINERALS) TABS tablet, Take 1 tablet by mouth daily., Disp: , Rfl:  .  Olopatadine HCl 0.2 % SOLN, Apply 1 drop to eye daily as needed for allergies., Disp: , Rfl: 3 .  prednisoLONE acetate (PRED FORTE) 1 % ophthalmic suspension, Place 1 drop into the left eye as needed (flare ups). , Disp: , Rfl:  .  triamcinolone cream (KENALOG) 0.1 %, Apply 1 application topically at bedtime as needed (for rash)., Disp: 30 g, Rfl: 0   Allergies  Allergen Reactions  . Gadobenate Nausea And Vomiting    Pt was fine after a few minutes , vomiting after contrast injection smills      Review of Systems  Constitutional: Negative for chills.  Gastrointestinal: Negative for nausea.  Genitourinary: Positive for dysuria and frequency. Negative for urgency.     Today's Vitals   01/05/20 1415  BP: 122/80  Pulse: 85  Temp: 98.7 F (37.1 C)  TempSrc: Oral  Weight: 210 lb 3.2 oz (95.3 kg)  Height: 5' 7.5" (1.715 m)  PainSc: 5   PainLoc: Abdomen   Body mass index is 32.44 kg/m.   Objective:  Physical Exam      Assessment And Plan:    1. Urinary frequency  Large leukocytes in her urine  Will treat with antibiotic and send for culture - POCT Urinalysis Dipstick (81002) - Culture, Urine - nitrofurantoin, macrocrystal-monohydrate, (MACROBID) 100 MG capsule; Take 1 capsule (100 mg total) by mouth 2 (two) times daily for 5 days.  Dispense: 10 capsule; Refill: 0 - Urine cytology ancillary only  2. Rash and nonspecific skin eruption  Intermittent rash to forehead, refill of steroid cream sent  None present currently - triamcinolone cream (KENALOG) 0.1 %; Apply 1 application topically at bedtime as needed (for rash).  Dispense: 30 g; Refill: 1   [name], FNP    THE PATIENT IS ENCOURAGED TO PRACTICE SOCIAL DISTANCING DUE TO THE COVID-19 PANDEMIC.

## 2020-01-09 ENCOUNTER — Encounter: Payer: Medicare HMO | Attending: Registered Nurse | Admitting: Registered Nurse

## 2020-01-09 ENCOUNTER — Encounter: Payer: Self-pay | Admitting: Registered Nurse

## 2020-01-09 ENCOUNTER — Other Ambulatory Visit: Payer: Self-pay

## 2020-01-09 VITALS — BP 132/73 | HR 73 | Temp 97.7°F | Wt 212.8 lb

## 2020-01-09 DIAGNOSIS — M7062 Trochanteric bursitis, left hip: Secondary | ICD-10-CM | POA: Diagnosis not present

## 2020-01-09 DIAGNOSIS — G894 Chronic pain syndrome: Secondary | ICD-10-CM | POA: Insufficient documentation

## 2020-01-09 DIAGNOSIS — Z79891 Long term (current) use of opiate analgesic: Secondary | ICD-10-CM | POA: Insufficient documentation

## 2020-01-09 DIAGNOSIS — M7061 Trochanteric bursitis, right hip: Secondary | ICD-10-CM | POA: Insufficient documentation

## 2020-01-09 DIAGNOSIS — Z800 Family history of malignant neoplasm of digestive organs: Secondary | ICD-10-CM | POA: Insufficient documentation

## 2020-01-09 DIAGNOSIS — Z5181 Encounter for therapeutic drug level monitoring: Secondary | ICD-10-CM

## 2020-01-09 DIAGNOSIS — M5416 Radiculopathy, lumbar region: Secondary | ICD-10-CM | POA: Diagnosis not present

## 2020-01-09 DIAGNOSIS — M129 Arthropathy, unspecified: Secondary | ICD-10-CM | POA: Insufficient documentation

## 2020-01-09 DIAGNOSIS — Z760 Encounter for issue of repeat prescription: Secondary | ICD-10-CM | POA: Diagnosis not present

## 2020-01-09 DIAGNOSIS — Z79899 Other long term (current) drug therapy: Secondary | ICD-10-CM | POA: Diagnosis not present

## 2020-01-09 DIAGNOSIS — M5136 Other intervertebral disc degeneration, lumbar region: Secondary | ICD-10-CM | POA: Diagnosis not present

## 2020-01-09 DIAGNOSIS — M47816 Spondylosis without myelopathy or radiculopathy, lumbar region: Secondary | ICD-10-CM | POA: Insufficient documentation

## 2020-01-09 DIAGNOSIS — Z87891 Personal history of nicotine dependence: Secondary | ICD-10-CM | POA: Insufficient documentation

## 2020-01-09 DIAGNOSIS — M25561 Pain in right knee: Secondary | ICD-10-CM | POA: Insufficient documentation

## 2020-01-09 DIAGNOSIS — I10 Essential (primary) hypertension: Secondary | ICD-10-CM | POA: Insufficient documentation

## 2020-01-09 DIAGNOSIS — M25562 Pain in left knee: Secondary | ICD-10-CM | POA: Insufficient documentation

## 2020-01-09 MED ORDER — HYDROCODONE-ACETAMINOPHEN 7.5-325 MG PO TABS: 1.0000 | ORAL_TABLET | Freq: Three times a day (TID) | ORAL | 0 refills | Status: DC | PRN

## 2020-01-09 NOTE — Progress Notes (Signed)
Subjective:    Patient ID: Brittany Crawford, female    DOB: [birth year], 64 y.o.   MRN: [id number]  HPI: Brittany Crawford is a 64 y.o. female who returns for follow up appointment for chronic pain and medication refill. She states her pain is located in her lower back radiating into her left hip. She rates her pain 3. Her current exercise regime is walking and performing stretching exercises.  Brittany Crawford Morphine equivalent is 22.77 MME.  UDS ordered today.   Pain Inventory Average Pain 4 Pain Right Now 3 My pain is intermittent, constant, dull and tingling  In the last 24 hours, has pain interfered with the following? General activity 5 Relation with others 9 Enjoyment of life 9 What TIME of day is your pain at its worst? morning and evening  Sleep (in general) Good  Pain is worse with: bending and standing Pain improves with: rest, heat/ice, therapy/exercise and medication Relief from Meds: 8  Mobility use a cane how many minutes can you walk? 20 ability to climb steps?  no do you drive?  no  Function disabled: date disabled 4  Neuro/Psych No problems in this area  Prior Studies none  Physicians involved in your care none    Family History  Problem Relation Age of Onset  . Colon cancer Maternal Uncle   . Stroke Mother    Social History   Socioeconomic History  . Marital status: Married    Spouse name: Not on file  . Number of children: 2  . Years of education: 62  . Highest education level: Not on file  Occupational History  . Occupation: disability  Tobacco Use  . Smoking status: Former Smoker    Packs/day: 0.00    Years: 0.00    Pack years: 0.00    Types: Cigarettes    Quit date: 03/13/2017    Years since quitting: 2.8  . Smokeless tobacco: Never Used  . Tobacco comment: 4  to 5  cigarettes daily for years  Substance and Sexual Activity  . Alcohol use: Not Currently    Comment: occassionally  . Drug use: Yes    Types: Hydrocodone  .  Sexual activity: Yes  Other Topics Concern  . Not on file  Social History Narrative   Patient is married with 2 children.   Patient is right handed.   Patient has hs education.   Patient drinks 4 cups daily.   Social Determinants of Health   Financial Resource Strain: Low Risk   . Difficulty of Paying Living Expenses: Not hard at all  Food Insecurity: No Food Insecurity  . Worried About Charity fundraiser in the Last Year: Never true  . Ran Out of Food in the Last Year: Never true  Transportation Needs: No Transportation Needs  . Lack of Transportation (Medical): No  . Lack of Transportation (Non-Medical): No  Physical Activity: Insufficiently Active  . Days of Exercise per Week: 2 days  . Minutes of Exercise per Session: 10 min  Stress: No Stress Concern Present  . Feeling of Stress : Not at all  Social Connections:   . Frequency of Communication with Friends and Family:   . Frequency of Social Gatherings with Friends and Family:   . Attends Religious Services:   . Active Member of Clubs or Organizations:   . Attends Archivist Meetings:   Marland Kitchen Marital Status:    Past Surgical History:  Procedure Laterality Date  . BREAST BIOPSY Left  04/29/2016  . BREAST BIOPSY Left 04/23/2016  . DILATATION & CURRETTAGE/HYSTEROSCOPY WITH RESECTOCOPE N/A 12/10/2012   Procedure: DILATATION & CURETTAGE/HYSTEROSCOPY WITH RESECTOCOPE;  Surgeon: Marvene Staff, MD;  Location: [facility];  Service: Gynecology;  Laterality: N/A;  . FOOT SURGERY     left-pins placed  . LYMPH NODE BIOPSY    . POLYPECTOMY N/A 12/10/2012   Procedure: POLYPECTOMY;  Surgeon: Marvene Staff, MD;  Location: [facility];  Service: Gynecology;  Laterality: N/A;  . STRABISMUS SURGERY Left 04/17/2017   Procedure: REPAIR STRABISMUS LEFT EYE;  Surgeon: Everitt Amber, MD;  Location: [facility];  Service: Ophthalmology;  Laterality: Left;   Past Medical History:  Diagnosis Date  . Chronic back pain    . H/O sarcoidosis   . Hypertension   . Seasonal allergies   . Stroke ([facility])    Temp 97.7 F (36.5 C)   Wt 212 lb 12.8 oz (96.5 kg)   LMP 11/13/2012   BMI 32.84 kg/m   Opioid Risk Score:   Fall Risk Score:  `1  Depression screen PHQ 2/9  Depression screen [facility] 2/9 01/05/2020 07/19/2019 04/13/2019 03/24/2019 02/07/2019 01/26/2019 01/25/2019  Decreased Interest 0 0 0 0 0 0 0  Down, Depressed, Hopeless 0 0 0 0 0 0 0  PHQ - 2 Score 0 0 0 0 0 0 0  Altered sleeping - - 0 - - - -  Tired, decreased energy - - 0 - - - -  Change in appetite - - 0 - - - -  Feeling bad or failure about yourself  - - 0 - - - -  Trouble concentrating - - 0 - - - -  Moving slowly or fidgety/restless - - 0 - - - -  Suicidal thoughts - - 0 - - - -  PHQ-9 Score - - 0 - - - -    Review of Systems  All other systems reviewed and are negative.      Objective:   Physical Exam Vitals and nursing note reviewed.  Constitutional:      Appearance: Normal appearance.  Cardiovascular:     Rate and Rhythm: Normal rate and regular rhythm.     Pulses: Normal pulses.     Heart sounds: Normal heart sounds.  Pulmonary:     Effort: Pulmonary effort is normal.     Breath sounds: Normal breath sounds.  Musculoskeletal:     Cervical back: Normal range of motion and neck supple.     Comments: Normal Muscle Bulk and Muscle Testing Reveals:  Upper Extremities: Full ROM and Muscle Strength 5/5 Lumbar Paraspinal Tenderness: L-3-L-5 Lower Extremities: Full ROM and Muscle Strength 5/5 Ar Gait   Skin:    General: Skin is warm and dry.  Neurological:     Mental Status: She is alert and oriented to person, place, and time.  Psychiatric:        Mood and Affect: Mood normal.        Behavior: Behavior normal.           Assessment & Plan:  1. Lumbar spondylosis with DDD and facet arthropathy.Left Lumbar Radiculitis: Continue HEP as Tolerated. Continue to Monitor. 03//29/2021 Refilled:Hydrocodone 7.5 /325 mg one tablet  every 8 hours as needed for pain #85.Second scripte-scribefor the following month. We will continue the opioid monitoring program, this consists of regular clinic visits, examinations, urine drug screen, pill counts as well as use of [place] Controlled Substance Reporting System. 2. Bilateral knee pain:  No complaints voiced today:Continue with heat/ice, exercise and Diclofenac.01/09/2020 3. Right thalamic/internal capsule lacunar infarct with persistent left hemisensory deficits:Neurology Following.Continue to Monitor.01/09/2020 4.LeftGreater Trochanteric Bursitis:No complaints today.Continue HEP as Tolerated and Alternate Ice and Heat Therapy.Continue current medication regime.Continue to Monitor.01/09/2020.  56minutes of face to face patient care time was spent during this visit. All questions were encouraged and answered.   F/U in46months

## 2020-01-12 ENCOUNTER — Encounter: Payer: Medicare HMO | Admitting: Registered Nurse

## 2020-01-12 ENCOUNTER — Other Ambulatory Visit: Payer: Self-pay | Admitting: Nurse Practitioner

## 2020-01-12 DIAGNOSIS — N760 Acute vaginitis: Secondary | ICD-10-CM

## 2020-01-12 DIAGNOSIS — B9689 Other specified bacterial agents as the cause of diseases classified elsewhere: Secondary | ICD-10-CM

## 2020-01-12 LAB — URINE CYTOLOGY ANCILLARY ONLY
Bacterial Vaginitis-Urine: POSITIVE — AB
Candida Urine: NEGATIVE
Chlamydia: NEGATIVE
Comment: NEGATIVE
Comment: NEGATIVE
Comment: NORMAL
Neisseria Gonorrhea: NEGATIVE
Trichomonas: NEGATIVE

## 2020-01-12 LAB — TOXASSURE SELECT,+ANTIDEPR,UR

## 2020-01-12 MED ORDER — TINIDAZOLE 500 MG PO TABS: ORAL_TABLET | ORAL | 0 refills | Status: DC

## 2020-01-16 ENCOUNTER — Telehealth: Payer: Self-pay | Admitting: *Deleted

## 2020-01-16 NOTE — Telephone Encounter (Signed)
Urine drug screen for this encounter is consistent for prescribed medication 

## 2020-02-09 ENCOUNTER — Other Ambulatory Visit: Payer: Self-pay | Admitting: Nurse Practitioner

## 2020-02-28 NOTE — Progress Notes (Signed)
Brittany Crawford - 64 y.o. female MRN [id number]  Date of birth: 1956-01-07  Office Visit Note: Visit Date: 12/07/2019 PCP: [name], FNP Referred by: [name], FNP  Subjective: Chief Complaint  Patient presents with  . Head - Pain   HPI: Brittany Crawford is a 64 y.o. female who comes in today For new patient consultation for chronic worsening upper cervical pain with associated cervicogenic occipital headache.  She is referred by Frann Rider, NP [facility].  She is referred today for evaluation for possible upper cervical facet joint/third occipital nerve block and potential radiofrequency ablation.  She is also seen there by Debbora Presto, NP and has received occipital nerve blocks from this nurse practitioner with at least temporary relief of some of her headache pain.  She describes 5 out of 10 intermittent sharp stabbing pains really in the upper cervical spine radiating to the occiput.  She reports this started over a year ago and is worse with holding her head down and sleeping on her belly.  She gets a real sharp pain when she does this.  She can also get pain with lifting up her head too quick or rotating too fast to look left or right.  When the pain is present is very quick and very sharp and does not last very long.  She has gotten mild relief with stretching pain medication and heating pad.  The nerve blocks did seem to help temporarily.  She has not noted any radicular symptoms in the arms.  She does have MRI of the cervical spine which is reviewed with her today using spine images and spine model.  This is reviewed below and show some spondylitic change at C4-5 and C5-6 with foraminal narrowing at C5-6 on the right foraminal narrowing on the left at C7.  No high-grade nerve compression or cervical stenosis.  She has tried and failed other conservative care and medication management through the neurologist.  Her case is complicated by chronic pain syndrome  on chronic opioid treatment.  This is managed at [facility] by Danella Sensing, NP.  She is on chronic opioid therapy further diagnosis of low back pain and spondylosis and facet arthropathy.         Review of Systems  Musculoskeletal: Positive for back pain, joint pain and neck pain.  Neurological: Positive for tingling and headaches.  All other systems reviewed and are negative.  Otherwise per HPI.  Assessment & Plan: Visit Diagnoses:  1. Cervical spondylosis without myelopathy   2. Bilateral occipital neuralgia   3. Cervicalgia   4. Chronic pain syndrome   5. Opioid use, unspecified, uncomplicated     Plan: Findings:  History of 1 year of chronic upper neck and occipital headache that are very infrequent and very quick but very painful when they happen.  She did get some relief with occipital nerve block.  We did discuss the use of diagnostic medial branch blocks at C2-3 and the reasoning behind potentially helping cervicogenic type headaches.  She does have cervical spondylosis but is more of the middle to lower facet joints although can always have pain in an area that just does not show up as arthritic changes.  At this point because the pain is fleeting and is very intermittent and not long-lasting she wants to hold off on any injection at this point.  We did discuss the fact that it is a little harder from a diagnostic standpoint if these  come and go but we could look at does not decrease the frequency although that is a little bit harder to do diagnostically.  At this point we will see her back as needed she understands what we are talking about at length and will wait to hear what she would like to do.  She will continue to see Frann Rider, NP for her headaches.    Meds & Orders: No orders of the defined types were placed in this encounter.  No orders of the defined types were placed in this encounter.   Follow-up: Return if symptoms worsen  or fail to improve.   Procedures: No procedures performed  No notes on file   Clinical History: MRI CERVICAL SPINE WITHOUT CONTRAST  TECHNIQUE: Multiplanar, multisequence MR imaging of the cervical spine was performed. No intravenous contrast was administered.  COMPARISON:  None.  FINDINGS: Alignment: Normal  Vertebrae: No fracture, evidence of discitis, or bone lesion.  Cord: Normal signal and morphology.  Posterior Fossa, vertebral arteries, paraspinal tissues: Negative.  Disc levels:  C1-C2: Normal.  C2-C3: Normal disc space and facets. No spinal canal or neuroforaminal stenosis.  C3-C4: There is mild uncovertebral hypertrophy with mild bilateral foraminal stenosis. No spinal canal stenosis.  C4-C5: Intermediate sized right uncovertebral osteophyte and left-greater-than-right facet hypertrophy. Moderate right and mild left foraminal stenosis. No spinal canal stenosis.  C5-C6: Intermediate sized disc osteophyte complex with moderate right and mild left foraminal stenosis. No central spinal canal stenosis.  C6-C7: Small disc osteophyte complex with mild right and moderate left foraminal stenosis. No spinal canal stenosis.  C7-T1: Normal disc space and facets. No spinal canal or neuroforaminal stenosis.  IMPRESSION: 1. Moderate right C5 and C6 neural foraminal stenosis due to combination of uncovertebral and facet hypertrophy. 2. Moderate left C7 neural foraminal stenosis. 3. No cervical spinal canal stenosis.   Electronically Signed   By: Ulyses Jarred M.D.   On: 09/08/2019 01:02   She reports that she quit smoking about 2 years ago. Her smoking use included cigarettes. She smoked 0.00 packs per day for 0.00 years. She has never used smokeless tobacco. No results for input(s): HGBA1C, LABURIC in the last 8760 hours.  Objective:  VS:  HT:5' 7.5" (171.5 cm)   WT:198 lb (89.8 kg)  BMI:30.54    BP:(!) 137/96  HR:97bpm  TEMP: ( )  RESP:   Physical Exam Vitals and nursing note reviewed.  Constitutional:      General: She is not in acute distress.    Appearance: Normal appearance. She is well-developed.  HENT:     Head: Normocephalic and atraumatic.     Nose: Nose normal.     Mouth/Throat:     Mouth: Mucous membranes are moist.     Pharynx: Oropharynx is clear.  Eyes:     Conjunctiva/sclera: Conjunctivae normal.     Pupils: Pupils are equal, round, and reactive to light.  Cardiovascular:     Rate and Rhythm: Regular rhythm.  Pulmonary:     Effort: Pulmonary effort is normal. No respiratory distress.  Abdominal:     General: There is no distension.     Palpations: Abdomen is soft.     Tenderness: There is no guarding.  Musculoskeletal:     Cervical back: Neck supple. Tenderness present. No rigidity.     Right lower leg: No edema.     Left lower leg: No edema.     Comments: Patient sits with forward flexed cervical spine.  She does have trigger  points noted in the levator scapula and trapezius in particular.  She does have some pain with extension of the cervical spine and she has some tenderness in the upper cervical lower occipital area.  She has a negative Tinel's over the occipital nerves.  She has good strength in the arms bilaterally with good wrist extension long finger flexion and abduction.  She has a negative Hoffmann's test.  She has negative Spurling's test.  Skin:    General: Skin is warm and dry.     Findings: No erythema or rash.  Neurological:     General: No focal deficit present.     Mental Status: She is alert and oriented to person, place, and time.     Motor: No abnormal muscle tone.     Coordination: Coordination normal.     Gait: Gait normal.  Psychiatric:        Mood and Affect: Mood normal.        Behavior: Behavior normal.        Thought Content: Thought content normal.     Ortho Exam  Imaging: No results found.  Past Medical/Family/Surgical/Social History: Medications &  Allergies reviewed per EMR, new medications updated. Patient Active Problem List   Diagnosis Date Noted  . Urinary frequency 01/05/2020  . Cervical spondylosis without myelopathy 12/07/2019  . Vaginal itching 01/26/2019  . Cervicogenic headache 01/25/2019  . Stage 3 chronic kidney disease 12/09/2018  . Acute vaginitis 09/15/2018  . Dysuria 09/15/2018  . Neuropathic pain 10/03/2015  . Chronic pain syndrome 08/03/2015  . Right-sided lacunar infarction ([place]) 12/04/2014  . Spondylosis of lumbar region without myelopathy or radiculopathy 12/04/2014  . Lumbar facet arthropathy 12/04/2014  . Marijuana abuse 07/07/2014  . Mixed hyperlipidemia 07/07/2014  . CVA (cerebral infarction) 07/06/2014  . Essential hypertension, benign 07/06/2014   Past Medical History:  Diagnosis Date  . Chronic back pain   . H/O sarcoidosis   . Hypertension   . Seasonal allergies   . Stroke [place])    Family History  Problem Relation Age of Onset  . Colon cancer Maternal Uncle   . Stroke Mother    Past Surgical History:  Procedure Laterality Date  . BREAST BIOPSY Left 04/29/2016  . BREAST BIOPSY Left 04/23/2016  . DILATATION & CURRETTAGE/HYSTEROSCOPY WITH RESECTOCOPE N/A 12/10/2012   Procedure: DILATATION & CURETTAGE/HYSTEROSCOPY WITH RESECTOCOPE;  Surgeon: Marvene Staff, MD;  Location: [facility];  Service: Gynecology;  Laterality: N/A;  . FOOT SURGERY     left-pins placed  . LYMPH NODE BIOPSY    . POLYPECTOMY N/A 12/10/2012   Procedure: POLYPECTOMY;  Surgeon: Marvene Staff, MD;  Location: [facility];  Service: Gynecology;  Laterality: N/A;  . STRABISMUS SURGERY Left 04/17/2017   Procedure: REPAIR STRABISMUS LEFT EYE;  Surgeon: Everitt Amber, MD;  Location: [place];  Service: Ophthalmology;  Laterality: Left;   Social History   Occupational History  . Occupation: disability  Tobacco Use  . Smoking status: Former Smoker    Packs/day: 0.00    Years: 0.00    Pack years: 0.00     Types: Cigarettes    Quit date: 03/13/2017    Years since quitting: 2.9  . Smokeless tobacco: Never Used  . Tobacco comment: 4  to 5  cigarettes daily for years  Substance and Sexual Activity  . Alcohol use: Not Currently    Comment: occassionally  . Drug use: Yes    Types: Hydrocodone  . Sexual activity: Yes

## 2020-03-06 ENCOUNTER — Other Ambulatory Visit: Payer: Self-pay

## 2020-03-06 ENCOUNTER — Encounter: Payer: Medicare HMO | Attending: Registered Nurse | Admitting: Registered Nurse

## 2020-03-06 ENCOUNTER — Encounter: Payer: Self-pay | Admitting: Registered Nurse

## 2020-03-06 VITALS — BP 119/77 | HR 84 | Temp 97.5°F | Ht 67.5 in | Wt 209.0 lb

## 2020-03-06 DIAGNOSIS — M25561 Pain in right knee: Secondary | ICD-10-CM | POA: Insufficient documentation

## 2020-03-06 DIAGNOSIS — Z760 Encounter for issue of repeat prescription: Secondary | ICD-10-CM | POA: Diagnosis not present

## 2020-03-06 DIAGNOSIS — Z87891 Personal history of nicotine dependence: Secondary | ICD-10-CM | POA: Diagnosis not present

## 2020-03-06 DIAGNOSIS — I10 Essential (primary) hypertension: Secondary | ICD-10-CM | POA: Diagnosis not present

## 2020-03-06 DIAGNOSIS — Z800 Family history of malignant neoplasm of digestive organs: Secondary | ICD-10-CM | POA: Diagnosis not present

## 2020-03-06 DIAGNOSIS — Z5181 Encounter for therapeutic drug level monitoring: Secondary | ICD-10-CM | POA: Diagnosis not present

## 2020-03-06 DIAGNOSIS — Z79899 Other long term (current) drug therapy: Secondary | ICD-10-CM | POA: Insufficient documentation

## 2020-03-06 DIAGNOSIS — M25562 Pain in left knee: Secondary | ICD-10-CM | POA: Diagnosis not present

## 2020-03-06 DIAGNOSIS — M129 Arthropathy, unspecified: Secondary | ICD-10-CM | POA: Diagnosis not present

## 2020-03-06 DIAGNOSIS — M7061 Trochanteric bursitis, right hip: Secondary | ICD-10-CM | POA: Insufficient documentation

## 2020-03-06 DIAGNOSIS — M5136 Other intervertebral disc degeneration, lumbar region: Secondary | ICD-10-CM | POA: Insufficient documentation

## 2020-03-06 DIAGNOSIS — M47816 Spondylosis without myelopathy or radiculopathy, lumbar region: Secondary | ICD-10-CM | POA: Diagnosis not present

## 2020-03-06 DIAGNOSIS — Z79891 Long term (current) use of opiate analgesic: Secondary | ICD-10-CM | POA: Insufficient documentation

## 2020-03-06 DIAGNOSIS — M25532 Pain in left wrist: Secondary | ICD-10-CM

## 2020-03-06 DIAGNOSIS — G894 Chronic pain syndrome: Secondary | ICD-10-CM | POA: Diagnosis not present

## 2020-03-06 DIAGNOSIS — M7062 Trochanteric bursitis, left hip: Secondary | ICD-10-CM | POA: Diagnosis not present

## 2020-03-06 MED ORDER — HYDROCODONE-ACETAMINOPHEN 7.5-325 MG PO TABS: 1.0000 | ORAL_TABLET | Freq: Three times a day (TID) | ORAL | 0 refills | Status: DC | PRN

## 2020-03-06 NOTE — Progress Notes (Signed)
Subjective:    Patient ID: Brittany Crawford, female    DOB: [birth year], 64 y.o.   MRN: [id number]  HPI: Brittany Crawford is a 64 y.o. female who returns for follow up appointment for chronic pain and medication refill. She states her pain is located in her left wrist ( joint pain) and lower back pain. She rates her pain 4. Her current exercise regime is walking and performing stretching exercises.  Brittany Crawford equivalent is 22.77  MME.    Last UDS was Performed on 01/09/2020, it was consistent.    Pain Inventory Average Pain 4 Pain Right Now 4 My pain is intermittent and constant  In the last 24 hours, has pain interfered with the following? General activity 8 Relation with others 10 Enjoyment of life 9 What TIME of day is your pain at its worst? morning, evening Sleep (in general) Good  Pain is worse with: bending and standing Pain improves with: heat/ice, therapy/exercise and medication Relief from Meds: 10  Mobility walk with assistance use a cane ability to climb steps?  yes do you drive?  no Do you have any goals in this area?  yes  Function disabled: date disabled .  Neuro/Psych No problems in this area  Prior Studies Any changes since last visit?  no  Physicians involved in your care Any changes since last visit?  no   Family History  Problem Relation Age of Onset  . Colon cancer Maternal Uncle   . Stroke Mother    Social History   Socioeconomic History  . Marital status: Married    Spouse name: Not on file  . Number of children: 2  . Years of education: 84  . Highest education level: Not on file  Occupational History  . Occupation: disability  Tobacco Use  . Smoking status: Former Smoker    Packs/day: 0.00    Years: 0.00    Pack years: 0.00    Types: Cigarettes    Quit date: 03/13/2017    Years since quitting: 2.9  . Smokeless tobacco: Never Used  . Tobacco comment: 4  to 5  cigarettes daily for years  Substance and Sexual  Activity  . Alcohol use: Not Currently    Comment: occassionally  . Drug use: Yes    Types: Hydrocodone  . Sexual activity: Yes  Other Topics Concern  . Not on file  Social History Narrative   Patient is married with 2 children.   Patient is right handed.   Patient has hs education.   Patient drinks 4 cups daily.   Social Determinants of Health   Financial Resource Strain: Low Risk   . Difficulty of Paying Living Expenses: Not hard at all  Food Insecurity: No Food Insecurity  . Worried About Charity fundraiser in the Last Year: Never true  . Ran Out of Food in the Last Year: Never true  Transportation Needs: No Transportation Needs  . Lack of Transportation (Medical): No  . Lack of Transportation (Non-Medical): No  Physical Activity: Insufficiently Active  . Days of Exercise per Week: 2 days  . Minutes of Exercise per Session: 10 min  Stress: No Stress Concern Present  . Feeling of Stress : Not at all  Social Connections:   . Frequency of Communication with Friends and Family:   . Frequency of Social Gatherings with Friends and Family:   . Attends Religious Services:   . Active Member of Clubs or Organizations:   . Attends  Club or Organization Meetings:   Marland Kitchen Marital Status:    Past Surgical History:  Procedure Laterality Date  . BREAST BIOPSY Left 04/29/2016  . BREAST BIOPSY Left 04/23/2016  . DILATATION & CURRETTAGE/HYSTEROSCOPY WITH RESECTOCOPE N/A 12/10/2012   Procedure: DILATATION & CURETTAGE/HYSTEROSCOPY WITH RESECTOCOPE;  Surgeon: Marvene Staff, MD;  Location: [facility];  Service: Gynecology;  Laterality: N/A;  . FOOT SURGERY     left-pins placed  . LYMPH NODE BIOPSY    . POLYPECTOMY N/A 12/10/2012   Procedure: POLYPECTOMY;  Surgeon: Marvene Staff, MD;  Location: [facility];  Service: Gynecology;  Laterality: N/A;  . STRABISMUS SURGERY Left 04/17/2017   Procedure: REPAIR STRABISMUS LEFT EYE;  Surgeon: Everitt Amber, MD;  Location: [place];   Service: Ophthalmology;  Laterality: Left;   Past Medical History:  Diagnosis Date  . Chronic back pain   . H/O sarcoidosis   . Hypertension   . Seasonal allergies   . Stroke ([place])    LMP 11/13/2012   Opioid Risk Score:   Fall Risk Score:  `1  Depression screen PHQ 2/9  Depression screen [facility] 2/9 01/05/2020 07/19/2019 04/13/2019 03/24/2019 02/07/2019 01/26/2019 01/25/2019  Decreased Interest 0 0 0 0 0 0 0  Down, Depressed, Hopeless 0 0 0 0 0 0 0  PHQ - 2 Score 0 0 0 0 0 0 0  Altered sleeping - - 0 - - - -  Tired, decreased energy - - 0 - - - -  Change in appetite - - 0 - - - -  Feeling bad or failure about yourself  - - 0 - - - -  Trouble concentrating - - 0 - - - -  Moving slowly or fidgety/restless - - 0 - - - -  Suicidal thoughts - - 0 - - - -  PHQ-9 Score - - 0 - - - -    Review of Systems  Constitutional: Negative.   HENT: Negative.   Eyes: Negative.   Respiratory: Negative.   Gastrointestinal: Negative.   Endocrine: Negative.   Genitourinary: Negative.   Musculoskeletal: Positive for back pain.  Skin: Negative.   Allergic/Immunologic: Negative.   Neurological: Negative.   Hematological: Negative.   Psychiatric/Behavioral: Negative.   All other systems reviewed and are negative.      Objective:   Physical Exam Vitals and nursing note reviewed.  Constitutional:      Appearance: Normal appearance.  Cardiovascular:     Rate and Rhythm: Normal rate and regular rhythm.     Pulses: Normal pulses.     Heart sounds: Normal heart sounds.  Pulmonary:     Effort: Pulmonary effort is normal.     Breath sounds: Normal breath sounds.  Musculoskeletal:     Cervical back: Normal range of motion and neck supple.     Comments: Normal Muscle Bulk and Muscle Testing Reveals:  Upper Extremities: Full ROM and Muscle Strength 5/5 , Lumbar Paraspinal Tenderness: L-3-L-5 Lower Extremities: Full ROM and Muscle Strength 5/5 Arises from Table with ease Narrow Based  Gait   Skin:     General: Skin is warm and dry.  Neurological:     Mental Status: She is alert and oriented to person, place, and time.  Psychiatric:        Mood and Affect: Mood normal.        Behavior: Behavior normal.           Assessment & Plan:  1. Lumbar spondylosis with DDD  and facet arthropathy.Left Lumbar Radiculitis: Continue HEP as Tolerated. Continue to Monitor. 05//25/2021 Refilled:Hydrocodone 7.5 /325 mg one tablet every 8 hours as needed for pain #85.Second scripte-scribefor the following month. We will continue the opioid monitoring program, this consists of regular clinic visits, examinations, urine drug screen, pill counts as well as use of [place] Controlled Substance Reporting System. 2. Bilateral knee pain: No complaints voiced today:Continue with heat/ice, exercise and Diclofenac.03/06/2020 3. Right thalamic/internal capsule lacunar infarct with persistent left hemisensory deficits:Neurology Following.Continue to Monitor.03/06/2020 4.LeftGreater Trochanteric Bursitis:No complaints today.Continue HEP as Tolerated and Alternate Ice and Heat Therapy.Continue current medication regime.Continue to Monitor.03/06/2020.  57minutes of face to face patient care time was spent during this visit. All questions were encouraged and answered.   F/U [id number]

## 2020-03-08 ENCOUNTER — Encounter: Payer: Medicare HMO | Admitting: Registered Nurse

## 2020-04-18 ENCOUNTER — Ambulatory Visit: Payer: Medicare HMO

## 2020-04-18 ENCOUNTER — Ambulatory Visit: Payer: Medicare HMO | Admitting: Nurse Practitioner

## 2020-04-23 ENCOUNTER — Other Ambulatory Visit (HOSPITAL_COMMUNITY)
Admission: RE | Admit: 2020-04-23 | Discharge: 2020-04-23 | Disposition: A | Discharge status: Home / Self Care | Payer: Medicare HMO | Source: Ambulatory Visit | Attending: Nurse Practitioner | Admitting: Nurse Practitioner

## 2020-04-23 ENCOUNTER — Other Ambulatory Visit: Payer: Self-pay

## 2020-04-23 ENCOUNTER — Ambulatory Visit (INDEPENDENT_AMBULATORY_CARE_PROVIDER_SITE_OTHER): Payer: Medicare HMO | Admitting: Nurse Practitioner

## 2020-04-23 ENCOUNTER — Encounter: Payer: Self-pay | Admitting: Nurse Practitioner

## 2020-04-23 VITALS — BP 120/78 | HR 87 | Temp 98.4°F | Ht 67.5 in | Wt 209.2 lb

## 2020-04-23 DIAGNOSIS — M545 Low back pain, unspecified: Secondary | ICD-10-CM

## 2020-04-23 DIAGNOSIS — R350 Frequency of micturition: Secondary | ICD-10-CM | POA: Diagnosis not present

## 2020-04-23 DIAGNOSIS — I10 Essential (primary) hypertension: Secondary | ICD-10-CM

## 2020-04-23 DIAGNOSIS — R3129 Other microscopic hematuria: Secondary | ICD-10-CM

## 2020-04-23 DIAGNOSIS — R1030 Lower abdominal pain, unspecified: Secondary | ICD-10-CM

## 2020-04-23 DIAGNOSIS — Z23 Encounter for immunization: Secondary | ICD-10-CM | POA: Diagnosis not present

## 2020-04-23 DIAGNOSIS — E782 Mixed hyperlipidemia: Secondary | ICD-10-CM

## 2020-04-23 LAB — POCT URINALYSIS DIPSTICK
Bilirubin, UA: NEGATIVE
Glucose, UA: NEGATIVE
Ketones, UA: NEGATIVE
Nitrite, UA: NEGATIVE
Protein, UA: NEGATIVE
Spec Grav, UA: 1.015 (ref 1.010–1.025)
Urobilinogen, UA: 0.2 E.U./dL
pH, UA: 7 (ref 5.0–8.0)

## 2020-04-23 MED ORDER — TETANUS-DIPHTH-ACELL PERTUSSIS 5-2.5-18.5 LF-MCG/0.5 IM SUSP: 0.5000 mL | Freq: Once | INTRAMUSCULAR | 0 refills | Status: AC

## 2020-04-23 MED ORDER — LEVOFLOXACIN 750 MG PO TABS
750.0000 mg | ORAL_TABLET | Freq: Every day | ORAL | 0 refills | Status: AC
Start: 2020-04-23 — End: 2020-04-30

## 2020-04-23 NOTE — Progress Notes (Addendum)
This visit occurred during the SARS-CoV-2 public health emergency.  Safety protocols were in place, including screening questions prior to the visit, additional usage of staff PPE, and extensive cleaning of exam room while observing appropriate contact time as indicated for disinfecting solutions.  Subjective:     Patient ID: Brittany Crawford , female    DOB: May 13, 1956 , 64 y.o.   MRN: [id number] .  Chief Complaint  Patient presents with  . Urinary Frequency    patient stated she started feeling like she has to urinate more frequently   . Hypertension    HPI  Presents today for follow up for HTN and complaints of urinary frequency and low back pain. This started a few days ago and denies any fever or chills. She drinks lots of water and noticed the frequency of urination is stronger and feels lots of pressure. She is not using any new soaps and is currently abstaining from sex. She is interested in a referral to OBGYN and is reporting that she is watching her salt and is taking all medication as directed. She otherwise is doing well.  She is taking tylenol in between her regular pain medications due to low abdominal pain/pressure.  Dysuria  This is a new problem. The current episode started in the past 7 days (2-3 days ago). The problem occurs every urination. The quality of the pain is described as aching. She is sexually active (uses lubricant). There is no history of pyelonephritis. Associated symptoms include frequency. Pertinent negatives include no chills ("to vaginal area"), discharge, hematuria, nausea, urgency or vomiting. There is no history of kidney stones.  Urinary Frequency  This is a recurrent problem. The current episode started more than 1 month ago. The problem occurs every urination. The problem has been unchanged. The patient is experiencing no pain. There has been no fever. She is not sexually active. There is no history of pyelonephritis. Associated symptoms include  frequency. Pertinent negatives include no chills ("to vaginal area"), discharge, hematuria, nausea, urgency or vomiting. She has tried antibiotics for the symptoms. The treatment provided mild relief. There is no history of kidney stones.  Hypertension This is a chronic problem. The current episode started more than 1 year ago. The problem is unchanged. The problem is uncontrolled. Pertinent negatives include no anxiety, blurred vision or palpitations. Risk factors for coronary artery disease include obesity and sedentary lifestyle. Past treatments include ACE inhibitors and angiotensin blockers. Compliance problems include exercise.  There is no history of angina or kidney disease. There is no history of chronic renal disease.     Past Medical History:  Diagnosis Date  . Chronic back pain   . H/O sarcoidosis   . Hypertension   . Seasonal allergies   . Stroke [facility])      Family History  Problem Relation Age of Onset  . Colon cancer Maternal Uncle   . Stroke Mother      Current Outpatient Medications:  .  Ascorbic Acid (VITAMIN C) 1000 MG tablet, Take 1,000 mg by mouth daily. Takes when coming down with a cold., Disp: , Rfl:  .  aspirin 325 MG tablet, Take 1 tablet (325 mg total) by mouth daily. (Patient taking differently: Take 81 mg by mouth daily. ), Disp: 30 tablet, Rfl: 3 .  diclofenac sodium (VOLTAREN) 1 % GEL, Apply 2 g topically 4 (four) times daily., Disp: 300 g, Rfl: 2 .  HYDROcodone-acetaminophen (NORCO) 7.5-325 MG tablet, Take 1 tablet by mouth every 8 (eight)  hours as needed for moderate pain., Disp: 85 tablet, Rfl: 0 .  Hypromellose (ARTIFICIAL TEARS OP), Place 1 drop into the right eye daily as needed (for dry eyes)., Disp: , Rfl:  .  losartan-hydrochlorothiazide (HYZAAR) 100-12.5 MG tablet, TAKE 1 TABLET BY MOUTH EVERY DAY, Disp: 90 tablet, Rfl: 1 .  Multiple Vitamin (MULTIVITAMIN WITH MINERALS) TABS tablet, Take 1 tablet by mouth daily., Disp: , Rfl:  .  Olopatadine HCl  0.2 % SOLN, Apply 1 drop to eye daily as needed for allergies., Disp: , Rfl: 3 .  prednisoLONE acetate (PRED FORTE) 1 % ophthalmic suspension, Place 1 drop into the left eye as needed (flare ups). , Disp: , Rfl:  .  triamcinolone cream (KENALOG) 0.1 %, Apply 1 application topically at bedtime as needed (for rash)., Disp: 30 g, Rfl: 1   Allergies  Allergen Reactions  . Gadobenate Nausea And Vomiting    Pt was fine after a few minutes , vomiting after contrast injection smills      Review of Systems  Constitutional: Negative for chills ("to vaginal area").  Eyes: Negative for blurred vision.  Cardiovascular: Negative for palpitations.  Gastrointestinal: Negative for nausea and vomiting.  Genitourinary: Positive for dysuria and frequency. Negative for hematuria and urgency.  Musculoskeletal: Positive for arthralgias and back pain.       Chronic Back Pain  Neurological: Negative.   Psychiatric/Behavioral: Negative.      Today's Vitals   04/23/20 1036  BP: 120/78  Pulse: 87  Temp: 98.4 F (36.9 C)  TempSrc: Oral  Weight: 209 lb 3.2 oz (94.9 kg)  Height: 5' 7.5" (1.715 m)  PainSc: 5   PainLoc: Back   Body mass index is 32.28 kg/m.   Objective:  Physical Exam Constitutional:      Appearance: Normal appearance. She is obese.  Neck:     Vascular: Carotid bruit present.  Cardiovascular:     Rate and Rhythm: Normal rate and regular rhythm.     Pulses: Normal pulses.     Heart sounds: Normal heart sounds. No murmur heard.   Pulmonary:     Effort: Pulmonary effort is normal.     Breath sounds: Normal breath sounds.  Abdominal:     General: Abdomen is flat. Bowel sounds are normal. There is no distension.     Palpations: Abdomen is soft.     Tenderness: There is no abdominal tenderness. There is right CVA tenderness (mild tenderness ). There is no left CVA tenderness or guarding.  Musculoskeletal:        General: Normal range of motion.     Cervical back: Normal range of  motion.  Skin:    General: Skin is warm and dry.     Capillary Refill: Capillary refill takes less than 2 seconds.  Neurological:     General: No focal deficit present.     Mental Status: She is alert and oriented to person, place, and time.  Psychiatric:        Mood and Affect: Mood normal.        Behavior: Behavior normal.        Thought Content: Thought content normal.        Judgment: Judgment normal.         Assessment And Plan:    1. Urinary frequency  Moderate blood urine and large leukocytes  Will send urinalysis for culture  Also educated on the proper way to wipe when voiding - from front to back - POCT Urinalysis  Dipstick (81002) - Urine Culture - Ambulatory referral to Gynecology - Urine cytology ancillary only - US Renal; Future  2. Essential hypertension . B/P is well controlled.  . CMP ordered to check renal function.  . The importance of regular exercise and dietary modification was stressed to the patient.   3. Lower abdominal pain - Ambulatory referral to Gynecology - Urine cytology ancillary only - US Renal; Future  4. Encounter for immunization  TDAP Rx sent to pharmacy due to having medicare for adults 42-62 years old every 10 years.  She is advised to not get shingles and tdap at the same time - Tdap ([place]) 5-2.5-18.5 LF-MCG/0.5 injection; Inject 0.5 mLs into the muscle once for 1 dose.  Dispense: 0.5 mL; Refill: 0  5. Other microscopic hematuria  Will check renal ultrasound since this is the 2nd or 3rd time has blood in urine over the last year - US Renal; Future  6. Acute left-sided low back pain without sciatica  Mild CVA tenderness present - US Renal; Future   Marylu Lund, RN    [name], DNP, FNP-BC  THE PATIENT IS ENCOURAGED TO PRACTICE SOCIAL DISTANCING DUE TO THE COVID-19 PANDEMIC.

## 2020-04-24 LAB — BMP8+EGFR
BUN/Creatinine Ratio: 9 — ABNORMAL LOW (ref 12–28)
BUN: 10 mg/dL (ref 8–27)
CO2: 22 mmol/L (ref 20–29)
Calcium: 10.2 mg/dL (ref 8.7–10.3)
Chloride: 101 mmol/L (ref 96–106)
Creatinine, Ser: 1.11 mg/dL — ABNORMAL HIGH (ref 0.57–1.00)
GFR calc Af Amer: 62 mL/min/{1.73_m2} (ref >59)
GFR calc non Af Amer: 54 mL/min/{1.73_m2} — ABNORMAL LOW (ref >59)
Glucose: 104 mg/dL — ABNORMAL HIGH (ref 65–99)
Potassium: 4.1 mmol/L (ref 3.5–5.2)
Sodium: 139 mmol/L (ref 134–144)

## 2020-04-24 LAB — LIPID PANEL
Chol/HDL Ratio: 4.4 ratio (ref 0.0–4.4)
Cholesterol, Total: 222 mg/dL — ABNORMAL HIGH (ref 100–199)
HDL: 50 mg/dL (ref >39)
LDL Chol Calc (NIH): 121 mg/dL — ABNORMAL HIGH (ref 0–99)
Triglycerides: 289 mg/dL — ABNORMAL HIGH (ref 0–149)
VLDL Cholesterol Cal: 51 mg/dL — ABNORMAL HIGH (ref 5–40)

## 2020-04-25 LAB — URINE CULTURE

## 2020-04-25 LAB — URINE CYTOLOGY ANCILLARY ONLY
Bacterial Vaginitis-Urine: POSITIVE — AB
Candida Urine: NEGATIVE
Chlamydia: NEGATIVE
Comment: NEGATIVE
Comment: NORMAL
Neisseria Gonorrhea: NEGATIVE

## 2020-04-30 ENCOUNTER — Other Ambulatory Visit: Payer: Self-pay

## 2020-04-30 ENCOUNTER — Encounter: Payer: Self-pay | Admitting: Registered Nurse

## 2020-04-30 ENCOUNTER — Encounter: Payer: Medicare HMO | Attending: Registered Nurse | Admitting: Registered Nurse

## 2020-04-30 VITALS — BP 126/81 | HR 87 | Temp 99.2°F | Ht 67.5 in | Wt 211.0 lb

## 2020-04-30 DIAGNOSIS — I10 Essential (primary) hypertension: Secondary | ICD-10-CM | POA: Insufficient documentation

## 2020-04-30 DIAGNOSIS — M7062 Trochanteric bursitis, left hip: Secondary | ICD-10-CM | POA: Insufficient documentation

## 2020-04-30 DIAGNOSIS — Z760 Encounter for issue of repeat prescription: Secondary | ICD-10-CM | POA: Insufficient documentation

## 2020-04-30 DIAGNOSIS — M25562 Pain in left knee: Secondary | ICD-10-CM | POA: Insufficient documentation

## 2020-04-30 DIAGNOSIS — M7061 Trochanteric bursitis, right hip: Secondary | ICD-10-CM | POA: Diagnosis not present

## 2020-04-30 DIAGNOSIS — M47816 Spondylosis without myelopathy or radiculopathy, lumbar region: Secondary | ICD-10-CM | POA: Insufficient documentation

## 2020-04-30 DIAGNOSIS — M25561 Pain in right knee: Secondary | ICD-10-CM | POA: Diagnosis not present

## 2020-04-30 DIAGNOSIS — Z79899 Other long term (current) drug therapy: Secondary | ICD-10-CM | POA: Insufficient documentation

## 2020-04-30 DIAGNOSIS — M129 Arthropathy, unspecified: Secondary | ICD-10-CM | POA: Insufficient documentation

## 2020-04-30 DIAGNOSIS — Z5181 Encounter for therapeutic drug level monitoring: Secondary | ICD-10-CM

## 2020-04-30 DIAGNOSIS — Z800 Family history of malignant neoplasm of digestive organs: Secondary | ICD-10-CM | POA: Diagnosis not present

## 2020-04-30 DIAGNOSIS — Z87891 Personal history of nicotine dependence: Secondary | ICD-10-CM | POA: Diagnosis not present

## 2020-04-30 DIAGNOSIS — M5136 Other intervertebral disc degeneration, lumbar region: Secondary | ICD-10-CM | POA: Insufficient documentation

## 2020-04-30 DIAGNOSIS — G894 Chronic pain syndrome: Secondary | ICD-10-CM | POA: Insufficient documentation

## 2020-04-30 DIAGNOSIS — Z79891 Long term (current) use of opiate analgesic: Secondary | ICD-10-CM | POA: Diagnosis not present

## 2020-04-30 MED ORDER — HYDROCODONE-ACETAMINOPHEN 7.5-325 MG PO TABS: 1.0000 | ORAL_TABLET | Freq: Three times a day (TID) | ORAL | 0 refills | Status: DC | PRN

## 2020-04-30 NOTE — Progress Notes (Signed)
Subjective:    Patient ID: Brittany Crawford, female    DOB: 1956-05-27, 64 y.o.   MRN: [id number]  HPI: Brittany Crawford is a 64 y.o. female who returns for follow up appointment for chronic pain and medication refill. She states her pain is located in her lower back. She rates her pain 4. Her current exercise regime is walking and performing stretching exercises.  Brittany Crawford arrived to office febrile, temperature was re-checked at 99.2. She states she was being treated for a UTI last week and was on antibiotics. She was instructed to F/U with her PCP, she verbalizes understanding. Also reports she was in [place] for 10 days, and returned last week. Also reports  she has received the COVID -19  vaccination and her family was following distance precautions.   Brittany Crawford Morphine equivalent is 22.77MME.    Last UDS was Performed on 01/09/2020, it was consistent.    Pain Inventory Average Pain 4 Pain Right Now 4 My pain is constant, dull, stabbing, tingling and aching  In the last 24 hours, has pain interfered with the following? General activity 7 Relation with others 8 Enjoyment of life 9 What TIME of day is your pain at its worst? Morning & Evening Sleep (in general) Good  Pain is worse with: bending and standing Pain improves with: rest, heat/ice and medication Relief from Meds: 8  Mobility use a cane how many minutes can you walk? 8-10 mins ability to climb steps?  yes do you drive?  no  Function disabled: date disabled 2018  Neuro/Psych tingling trouble walking  Prior Studies Any changes since last visit?  no  Physicians involved in your care Any changes since last visit?  no   Family History  Problem Relation Age of Onset  . Colon cancer Maternal Uncle   . Stroke Mother    Social History   Socioeconomic History  . Marital status: Married    Spouse name: Not on file  . Number of children: 2  . Years of education: 3  . Highest education level: Not on  file  Occupational History  . Occupation: disability  Tobacco Use  . Smoking status: Former Smoker    Packs/day: 0.00    Years: 0.00    Pack years: 0.00    Types: Cigarettes    Quit date: 03/13/2017    Years since quitting: 3.1  . Smokeless tobacco: Never Used  . Tobacco comment: 4  to 5  cigarettes daily for years  Vaping Use  . Vaping Use: Never used  Substance and Sexual Activity  . Alcohol use: Not Currently    Comment: occassionally  . Drug use: Yes    Types: Hydrocodone  . Sexual activity: Yes  Other Topics Concern  . Not on file  Social History Narrative   Patient is married with 2 children.   Patient is right handed.   Patient has hs education.   Patient drinks 4 cups daily.   Social Determinants of Health   Financial Resource Strain:   . Difficulty of Paying Living Expenses:   Food Insecurity:   . Worried About Charity fundraiser in the Last Year:   . Arboriculturist in the Last Year:   Transportation Needs:   . Film/video editor (Medical):   Marland Kitchen Lack of Transportation (Non-Medical):   Physical Activity:   . Days of Exercise per Week:   . Minutes of Exercise per Session:   Stress:   .  Feeling of Stress :   Social Connections:   . Frequency of Communication with Friends and Family:   . Frequency of Social Gatherings with Friends and Family:   . Attends Religious Services:   . Active Member of Clubs or Organizations:   . Attends Archivist Meetings:   Marland Kitchen Marital Status:    Past Surgical History:  Procedure Laterality Date  . BREAST BIOPSY Left 04/29/2016  . BREAST BIOPSY Left 04/23/2016  . DILATATION & CURRETTAGE/HYSTEROSCOPY WITH RESECTOCOPE N/A 12/10/2012   Procedure: DILATATION & CURETTAGE/HYSTEROSCOPY WITH RESECTOCOPE;  Surgeon: Marvene Staff, MD;  Location: [facility];  Service: Gynecology;  Laterality: N/A;  . FOOT SURGERY     left-pins placed  . LYMPH NODE BIOPSY    . POLYPECTOMY N/A 12/10/2012   Procedure: POLYPECTOMY;  Surgeon:  Marvene Staff, MD;  Location: [facility];  Service: Gynecology;  Laterality: N/A;  . STRABISMUS SURGERY Left 04/17/2017   Procedure: REPAIR STRABISMUS LEFT EYE;  Surgeon: Everitt Amber, MD;  Location: [facility];  Service: Ophthalmology;  Laterality: Left;   Past Medical History:  Diagnosis Date  . Chronic back pain   . H/O sarcoidosis   . Hypertension   . Seasonal allergies   . Stroke ([place])    BP 126/81   Pulse 87   Temp 100 F (37.8 C)   Ht 5' 7.5" (1.715 m)   Wt 211 lb (95.7 kg)   LMP 11/13/2012   SpO2 97%   BMI 32.56 kg/m   Opioid Risk Score:   Fall Risk Score:  `1  Depression screen PHQ 2/9  Depression screen [facility] 2/9 01/05/2020 07/19/2019 04/13/2019 03/24/2019 02/07/2019 01/26/2019 01/25/2019  Decreased Interest 0 0 0 0 0 0 0  Down, Depressed, Hopeless 0 0 0 0 0 0 0  PHQ - 2 Score 0 0 0 0 0 0 0  Altered sleeping - - 0 - - - -  Tired, decreased energy - - 0 - - - -  Change in appetite - - 0 - - - -  Feeling bad or failure about yourself  - - 0 - - - -  Trouble concentrating - - 0 - - - -  Moving slowly or fidgety/restless - - 0 - - - -  Suicidal thoughts - - 0 - - - -  PHQ-9 Score - - 0 - - - -   Review of Systems  Constitutional: Negative.   HENT: Negative.   Eyes: Negative.   Respiratory: Negative.   Cardiovascular: Negative.   Gastrointestinal: Negative.   Endocrine: Negative.   Genitourinary: Negative.   Musculoskeletal: Positive for back pain and gait problem.  Skin: Negative.   Allergic/Immunologic: Negative.   Neurological:       Tingling in hands and wrist  Hematological: Negative.   Psychiatric/Behavioral: Negative.   All other systems reviewed and are negative.      Objective:   Physical Exam Vitals and nursing note reviewed.  Constitutional:      Appearance: Normal appearance.  Cardiovascular:     Rate and Rhythm: Normal rate and regular rhythm.     Pulses: Normal pulses.     Heart sounds: Normal heart sounds.  Pulmonary:      Effort: Pulmonary effort is normal.     Breath sounds: Normal breath sounds.  Musculoskeletal:     Cervical back: Normal range of motion and neck supple.     Comments: Normal Muscle Bulk and Muscle Testing Reveals:  Upper Extremities:  Full ROM and Muscle Strength 5/5 Lumbar Paraspinal Tenderness: L-4-L-5 Lower Extremities: Full ROM and Muscle Strength 5/5 Arises from Table with ease Narrow Based  Gait   Skin:    General: Skin is warm and dry.  Neurological:     Mental Status: She is alert and oriented to person, place, and time.  Psychiatric:        Mood and Affect: Mood normal.        Behavior: Behavior normal.           Assessment & Plan:  1. Lumbar spondylosis with DDD and facet arthropathy.Left Lumbar Radiculitis: Continue HEP as Tolerated. Continue to Monitor.04/30/2020 Refilled:Hydrocodone 7.5 /325 mg one tablet every 8 hours as needed for pain #85.Second scripte-scribefor the following month. We will continue the opioid monitoring program, this consists of regular clinic visits, examinations, urine drug screen, pill counts as well as use of [place] Controlled Substance Reporting System. 2. Bilateral knee pain: No complaints voiced today:Continue with heat/ice, exercise and Diclofenac.04/30/2020 3. Right thalamic/internal capsule lacunar infarct with persistent left hemisensory deficits:Neurology Following.Continue to Monitor.04/30/2020 4.LeftGreater Trochanteric Bursitis:No complaints today.Continue HEP as Tolerated and Alternate Ice and Heat Therapy.Continue current medication regime.Continue to Monitor.04/30/2020.  25minutes of face to face patient care time was spent during this visit. All questions were encouraged and answered.   F/U in61months

## 2020-05-01 ENCOUNTER — Encounter: Payer: Medicare HMO | Admitting: Registered Nurse

## 2020-05-02 ENCOUNTER — Ambulatory Visit
Admission: RE | Admit: 2020-05-02 | Discharge: 2020-05-02 | Disposition: A | Discharge status: Home / Self Care | Payer: Medicare HMO | Source: Ambulatory Visit | Attending: Nurse Practitioner | Admitting: Nurse Practitioner

## 2020-05-02 DIAGNOSIS — R1030 Lower abdominal pain, unspecified: Secondary | ICD-10-CM

## 2020-05-02 DIAGNOSIS — R350 Frequency of micturition: Secondary | ICD-10-CM

## 2020-05-02 DIAGNOSIS — R3129 Other microscopic hematuria: Secondary | ICD-10-CM

## 2020-05-02 DIAGNOSIS — M545 Low back pain, unspecified: Secondary | ICD-10-CM

## 2020-05-04 ENCOUNTER — Other Ambulatory Visit: Payer: Self-pay | Admitting: Nurse Practitioner

## 2020-05-04 DIAGNOSIS — N281 Cyst of kidney, acquired: Secondary | ICD-10-CM

## 2020-05-21 ENCOUNTER — Telehealth: Payer: Self-pay

## 2020-05-21 NOTE — Telephone Encounter (Signed)
call Brittany Crawford and see if she is taking her losartan daily her ins company saying she is not getting it filled as often as she should be  Per pt she takes her medications on a regular the pharmacy seems to be filling them to fast.

## 2020-05-23 ENCOUNTER — Telehealth: Payer: Self-pay | Admitting: *Deleted

## 2020-05-23 DIAGNOSIS — M47816 Spondylosis without myelopathy or radiculopathy, lumbar region: Secondary | ICD-10-CM

## 2020-05-23 MED ORDER — HYDROCODONE-ACETAMINOPHEN 7.5-325 MG PO TABS: 1.0000 | ORAL_TABLET | Freq: Three times a day (TID) | ORAL | 0 refills | Status: DC | PRN

## 2020-05-23 NOTE — Telephone Encounter (Signed)
Brittany Crawford is requesting that if possible her  Hydrocodone be moved to the [facility] on [address]. She does not drive and doesn't like to take public transportation if she can help it and this would be more convenient for her.  I have called and cancelled the Rx at the [facility] on [address].

## 2020-05-23 NOTE — Telephone Encounter (Signed)
PMP was Reviewed. Hydrocodone e-scribed.

## 2020-06-19 ENCOUNTER — Other Ambulatory Visit: Payer: Self-pay | Admitting: Nurse Practitioner

## 2020-06-19 DIAGNOSIS — Z1231 Encounter for screening mammogram for malignant neoplasm of breast: Secondary | ICD-10-CM

## 2020-06-21 ENCOUNTER — Ambulatory Visit (INDEPENDENT_AMBULATORY_CARE_PROVIDER_SITE_OTHER): Payer: Medicare HMO | Admitting: Obstetrics and Gynecology

## 2020-06-21 ENCOUNTER — Other Ambulatory Visit: Payer: Self-pay

## 2020-06-21 ENCOUNTER — Encounter: Payer: Self-pay | Admitting: Obstetrics and Gynecology

## 2020-06-21 VITALS — BP 129/88 | HR 82 | Ht 67.0 in | Wt 208.0 lb

## 2020-06-21 DIAGNOSIS — N952 Postmenopausal atrophic vaginitis: Secondary | ICD-10-CM | POA: Insufficient documentation

## 2020-06-21 DIAGNOSIS — R350 Frequency of micturition: Secondary | ICD-10-CM | POA: Diagnosis not present

## 2020-06-21 MED ORDER — PREMARIN 0.625 MG/GM VA CREA: 1.0000 | TOPICAL_CREAM | Freq: Every day | VAGINAL | 3 refills | Status: DC

## 2020-06-21 NOTE — Patient Instructions (Signed)
Atrophic Vaginitis Atrophic vaginitis is a condition in which the tissues that line the vagina become dry and thin. This condition occurs in women who have stopped having their period. It is caused by a drop in a female hormone (estrogen). This hormone helps:  To keep the vagina moist.  To make a clear fluid. This clear fluid helps: ? To make the vagina ready for sex. ? To protect the vagina from infection. If the lining of the vagina is dry and thin, it may cause irritation, burning, or itchiness. It may also:  Make sex painful.  Make an exam of your vagina painful.  Cause bleeding.  Make you lose interest in sex.  Cause a burning feeling when you pee (urinate).  Cause a brown or yellow fluid to come from your vagina. Some women do not have symptoms. Follow these instructions at home: Medicines  Take over-the-counter and prescription medicines only as told by your doctor.  Do not use herbs or other medicines unless your doctor says it is okay.  Use medicines for for dryness. These include: ? Oils to make the vagina soft. ? Creams. ? Moisturizers. General instructions  Do not douche.  Do not use products that can make your vagina dry. These include: ? Scented sprays. ? Scented tampons. ? Scented soaps.  Sex can help increase blood flow and soften the tissue in the vagina. If it hurts to have sex: ? Tell your partner. ? Use products to make sex more comfortable. Use these only as told by your doctor. Contact a doctor if you:  Have discharge from the vagina that is different than usual.  Have a bad smell coming from your vagina.  Have new symptoms.  Do not get better.  Get worse. Summary  Atrophic vaginitis is a condition in which the lining of the vagina becomes dry and thin.  This condition affects women who have stopped having their periods.  Treatment may include using products that help make the vagina soft.  Call a doctor if do not get better with  treatment. This information is not intended to replace advice given to you by your health care provider. Make sure you discuss any questions you have with your health care provider. Document Revised: 10/12/2017 Document Reviewed: 10/12/2017 Elsevier Patient Education  2020 Elsevier Inc.  

## 2020-06-21 NOTE — Progress Notes (Signed)
Subjective:    Patient ID: Brittany Crawford is a 64 y.o. female presenting with Urinary Frequency  on 06/21/2020  HPI: 65 yo G4P2, SVD x 2 seen at [facility] for discussion of urinary frequency.  Pt has had this issue for the last 2-3 months.  Her primary complaint is regarding a pelvic pressure.  She notes nocturia twice nightly.  The patient denies loss of fluid with cough or laugh.  She states she is uncomfortable holding her urine and it causes her to want to use the bathroom with urgency.  Pt denies any recent "accidents."  She has taken no meds for urinary frequency and does not use any incontinence garments.  Pt does note caffeine 2 cups of coffee/day.  Pt also notes vaginal dryness and occasional discomfort during intercourse.  She noted menopause in her 35s.  The pt did have an UTI in July 2021 which was treated with antibiotics and symptoms resolved quickly.  The patient also has an appointment with Urology soon for further evaluation.  Recent renal CT was overall WNL.  Review of Systems  Constitutional: Negative.   HENT: Negative.   Eyes: Negative.   Respiratory: Negative.   Cardiovascular: Negative.   Gastrointestinal: Positive for abdominal pain.  Genitourinary: Positive for frequency, urgency and vaginal pain. Negative for dysuria and enuresis.  Musculoskeletal: Negative.   Neurological: Negative.   Psychiatric/Behavioral: Negative.    Past Medical History:  Diagnosis Date  . Chronic back pain   . H/O sarcoidosis   . Hypertension   . Seasonal allergies   . Stroke [facility])    Past Surgical History:  Procedure Laterality Date  . BREAST BIOPSY Left 04/29/2016  . BREAST BIOPSY Left 04/23/2016  . DILATATION & CURRETTAGE/HYSTEROSCOPY WITH RESECTOCOPE N/A 12/10/2012   Procedure: DILATATION & CURETTAGE/HYSTEROSCOPY WITH RESECTOCOPE;  Surgeon: Marvene Staff, MD;  Location: [facility];  Service: Gynecology;  Laterality: N/A;  . FOOT SURGERY     left-pins placed  . LYMPH NODE BIOPSY     . POLYPECTOMY N/A 12/10/2012   Procedure: POLYPECTOMY;  Surgeon: Marvene Staff, MD;  Location: [facility];  Service: Gynecology;  Laterality: N/A;  . STRABISMUS SURGERY Left 04/17/2017   Procedure: REPAIR STRABISMUS LEFT EYE;  Surgeon: Everitt Amber, MD;  Location: [facility];  Service: Ophthalmology;  Laterality: Left;   Social History   Socioeconomic History  . Marital status: Married    Spouse name: Not on file  . Number of children: 2  . Years of education: 45  . Highest education level: Not on file  Occupational History  . Occupation: disability  Tobacco Use  . Smoking status: Former Smoker    Packs/day: 0.00    Years: 0.00    Pack years: 0.00    Types: Cigarettes    Quit date: 03/13/2017    Years since quitting: 3.2  . Smokeless tobacco: Never Used  . Tobacco comment: 4  to 5  cigarettes daily for years  Vaping Use  . Vaping Use: Never used  Substance and Sexual Activity  . Alcohol use: Not Currently    Comment: occassionally  . Drug use: Yes    Types: Hydrocodone  . Sexual activity: Yes  Other Topics Concern  . Not on file  Social History Narrative   Patient is married with 2 children.   Patient is right handed.   Patient has hs education.   Patient drinks 4 cups daily.   Social Determinants of Radio broadcast assistant  Strain:   . Difficulty of Paying Living Expenses: Not on file  Food Insecurity:   . Worried About Charity fundraiser in the Last Year: Not on file  . Ran Out of Food in the Last Year: Not on file  Transportation Needs:   . Lack of Transportation (Medical): Not on file  . Lack of Transportation (Non-Medical): Not on file  Physical Activity:   . Days of Exercise per Week: Not on file  . Minutes of Exercise per Session: Not on file  Stress:   . Feeling of Stress : Not on file  Social Connections:   . Frequency of Communication with Friends and Family: Not on file  . Frequency of Social Gatherings with Friends and  Family: Not on file  . Attends Religious Services: Not on file  . Active Member of Clubs or Organizations: Not on file  . Attends Archivist Meetings: Not on file  . Marital Status: Not on file  Intimate Partner Violence:   . Fear of Current or Ex-Partner: Not on file  . Emotionally Abused: Not on file  . Physically Abused: Not on file  . Sexually Abused: Not on file      Objective:    BP 129/88   Pulse 82   Ht 5\' 7"  (1.702 m)   Wt 208 lb (94.3 kg)   LMP 11/13/2012   BMI 32.58 kg/m  Physical Exam Constitutional:      Appearance: Normal appearance. She is normal weight.  HENT:     Head: Normocephalic and atraumatic.  Cardiovascular:     Rate and Rhythm: Normal rate and regular rhythm.     Heart sounds: Normal heart sounds.  Pulmonary:     Effort: Pulmonary effort is normal.     Breath sounds: Normal breath sounds.  Genitourinary:    Comments: Vagina: mild vaginal atrophy noted Cervix/uterus with minimal descent and well supported No obvious cystocele No obvious bladder leakage Musculoskeletal:        General: Normal range of motion.  Skin:    General: Skin is warm and dry.  Neurological:     Mental Status: She is alert.         Assessment & Plan:  Urinary frequency:  Do not believe that the patient has stress incontinence, she may have some urge incontinence which would be best evaluated with urodynamics  Vaginal atrophy:  Mild atrophy noted, per history it appears there is mild irritation Reviewed medical history, hx of stroke noted but it was not thromboembolic. Due to limited full body exposure, will try premarin vaginal cream x 3-4 months to improve vaginal tissue Nightly x 2 weeks then 1-2 times per week  MDM low Return in about 4 months (around 10/21/2020) for follow up, in person.  Griffin Basil 06/21/2020 2:33 PM

## 2020-06-26 ENCOUNTER — Other Ambulatory Visit: Payer: Self-pay

## 2020-06-26 ENCOUNTER — Encounter: Payer: Self-pay | Admitting: Registered Nurse

## 2020-06-26 ENCOUNTER — Encounter: Payer: Medicare HMO | Attending: Registered Nurse | Admitting: Registered Nurse

## 2020-06-26 VITALS — BP 134/84 | HR 84 | Temp 98.9°F | Ht 67.0 in | Wt 206.0 lb

## 2020-06-26 DIAGNOSIS — Z79899 Other long term (current) drug therapy: Secondary | ICD-10-CM | POA: Insufficient documentation

## 2020-06-26 DIAGNOSIS — M546 Pain in thoracic spine: Secondary | ICD-10-CM

## 2020-06-26 DIAGNOSIS — M25562 Pain in left knee: Secondary | ICD-10-CM | POA: Diagnosis not present

## 2020-06-26 DIAGNOSIS — Z5181 Encounter for therapeutic drug level monitoring: Secondary | ICD-10-CM | POA: Diagnosis not present

## 2020-06-26 DIAGNOSIS — M7062 Trochanteric bursitis, left hip: Secondary | ICD-10-CM | POA: Diagnosis not present

## 2020-06-26 DIAGNOSIS — M47816 Spondylosis without myelopathy or radiculopathy, lumbar region: Secondary | ICD-10-CM | POA: Diagnosis not present

## 2020-06-26 DIAGNOSIS — M129 Arthropathy, unspecified: Secondary | ICD-10-CM | POA: Diagnosis not present

## 2020-06-26 DIAGNOSIS — Z79891 Long term (current) use of opiate analgesic: Secondary | ICD-10-CM | POA: Diagnosis not present

## 2020-06-26 DIAGNOSIS — G8929 Other chronic pain: Secondary | ICD-10-CM

## 2020-06-26 DIAGNOSIS — M5136 Other intervertebral disc degeneration, lumbar region: Secondary | ICD-10-CM | POA: Insufficient documentation

## 2020-06-26 DIAGNOSIS — G894 Chronic pain syndrome: Secondary | ICD-10-CM

## 2020-06-26 DIAGNOSIS — Z800 Family history of malignant neoplasm of digestive organs: Secondary | ICD-10-CM | POA: Insufficient documentation

## 2020-06-26 DIAGNOSIS — M7061 Trochanteric bursitis, right hip: Secondary | ICD-10-CM

## 2020-06-26 DIAGNOSIS — M25561 Pain in right knee: Secondary | ICD-10-CM | POA: Insufficient documentation

## 2020-06-26 DIAGNOSIS — M5416 Radiculopathy, lumbar region: Secondary | ICD-10-CM

## 2020-06-26 DIAGNOSIS — Z87891 Personal history of nicotine dependence: Secondary | ICD-10-CM | POA: Diagnosis not present

## 2020-06-26 DIAGNOSIS — Z760 Encounter for issue of repeat prescription: Secondary | ICD-10-CM | POA: Insufficient documentation

## 2020-06-26 DIAGNOSIS — I10 Essential (primary) hypertension: Secondary | ICD-10-CM | POA: Diagnosis not present

## 2020-06-26 MED ORDER — HYDROCODONE-ACETAMINOPHEN 7.5-325 MG PO TABS: 1.0000 | ORAL_TABLET | Freq: Three times a day (TID) | ORAL | 0 refills | Status: DC | PRN

## 2020-06-26 NOTE — Progress Notes (Signed)
Subjective:    Patient ID: Brittany Crawford, female    DOB: 1955/11/08, 64 y.o.   MRN: [id number]  HPI: Brittany Crawford is a 64 y.o. female who returns for follow up appointment for chronic pain and medication refill. She states her pain is located in her mid- lower back pain radiating into her bilateral hips. She rates her pain 3. Her current exercise regime is walking, performing stretching exercises and riding her stationary bicycle for 3- 4 minutes two days a week.   Brittany Crawford Morphine equivalent is 22.77 MME.  UDS ordered for today.   Pain Inventory Average Pain 4 Pain Right Now 3 My pain is intermittent, sharp, dull and aching  In the last 24 hours, has pain interfered with the following? General activity 8 Relation with others 9 Enjoyment of life 9 What TIME of day is your pain at its worst? evening Sleep (in general) Fair  Pain is worse with: bending and standing Pain improves with: rest, heat/ice, therapy/exercise and medication Relief from Meds: 9  Family History  Problem Relation Age of Onset  . Colon cancer Maternal Uncle   . Stroke Mother    Social History   Socioeconomic History  . Marital status: Married    Spouse name: Not on file  . Number of children: 2  . Years of education: 41  . Highest education level: Not on file  Occupational History  . Occupation: disability  Tobacco Use  . Smoking status: Former Smoker    Packs/day: 0.00    Years: 0.00    Pack years: 0.00    Types: Cigarettes    Quit date: 03/13/2017    Years since quitting: 3.2  . Smokeless tobacco: Never Used  . Tobacco comment: 4  to 5  cigarettes daily for years  Vaping Use  . Vaping Use: Never used  Substance and Sexual Activity  . Alcohol use: Not Currently    Comment: occassionally  . Drug use: Yes    Types: Hydrocodone  . Sexual activity: Yes  Other Topics Concern  . Not on file  Social History Narrative   Patient is married with 2 children.   Patient is right  handed.   Patient has hs education.   Patient drinks 4 cups daily.   Social Determinants of Health   Financial Resource Strain:   . Difficulty of Paying Living Expenses: Not on file  Food Insecurity:   . Worried About Charity fundraiser in the Last Year: Not on file  . Ran Out of Food in the Last Year: Not on file  Transportation Needs:   . Lack of Transportation (Medical): Not on file  . Lack of Transportation (Non-Medical): Not on file  Physical Activity:   . Days of Exercise per Week: Not on file  . Minutes of Exercise per Session: Not on file  Stress:   . Feeling of Stress : Not on file  Social Connections:   . Frequency of Communication with Friends and Family: Not on file  . Frequency of Social Gatherings with Friends and Family: Not on file  . Attends Religious Services: Not on file  . Active Member of Clubs or Organizations: Not on file  . Attends Archivist Meetings: Not on file  . Marital Status: Not on file   Past Surgical History:  Procedure Laterality Date  . BREAST BIOPSY Left 04/29/2016  . BREAST BIOPSY Left 04/23/2016  . DILATATION & CURRETTAGE/HYSTEROSCOPY WITH RESECTOCOPE N/A 12/10/2012  Procedure: [place];  Surgeon: Marvene Staff, MD;  Location: [facility];  Service: Gynecology;  Laterality: N/A;  . FOOT SURGERY     left-pins placed  . LYMPH NODE BIOPSY    . POLYPECTOMY N/A 12/10/2012   Procedure: POLYPECTOMY;  Surgeon: Marvene Staff, MD;  Location: [facility];  Service: Gynecology;  Laterality: N/A;  . STRABISMUS SURGERY Left 04/17/2017   Procedure: REPAIR STRABISMUS LEFT EYE;  Surgeon: Everitt Amber, MD;  Location: [facility];  Service: Ophthalmology;  Laterality: Left;   Past Surgical History:  Procedure Laterality Date  . BREAST BIOPSY Left 04/29/2016  . BREAST BIOPSY Left 04/23/2016  . DILATATION & CURRETTAGE/HYSTEROSCOPY WITH RESECTOCOPE N/A 12/10/2012   Procedure:  DILATATION & CURETTAGE/HYSTEROSCOPY WITH RESECTOCOPE;  Surgeon: Marvene Staff, MD;  Location: [facility];  Service: Gynecology;  Laterality: N/A;  . FOOT SURGERY     left-pins placed  . LYMPH NODE BIOPSY    . POLYPECTOMY N/A 12/10/2012   Procedure: POLYPECTOMY;  Surgeon: Marvene Staff, MD;  Location: [facility];  Service: Gynecology;  Laterality: N/A;  . STRABISMUS SURGERY Left 04/17/2017   Procedure: REPAIR STRABISMUS LEFT EYE;  Surgeon: Everitt Amber, MD;  Location: [facility];  Service: Ophthalmology;  Laterality: Left;   Past Medical History:  Diagnosis Date  . Chronic back pain   . H/O sarcoidosis   . Hypertension   . Seasonal allergies   . Stroke (HCC)    BP 134/84   Pulse 84   Temp 98.9 F (37.2 C)   Ht 5\' 7"  (1.702 m)   Wt 206 lb (93.4 kg)   LMP 11/13/2012   SpO2 96%   BMI 32.26 kg/m   Opioid Risk Score:   Fall Risk Score:  `1  Depression screen PHQ 2/9  Depression screen [facility] 2/9 06/22/2020 04/30/2020 01/05/2020 07/19/2019 04/13/2019 03/24/2019 02/07/2019  Decreased Interest - 0 0 0 0 0 0  Down, Depressed, Hopeless 0 0 0 0 0 0 0  PHQ - 2 Score 0 0 0 0 0 0 0  Altered sleeping 0 - - - 0 - -  Tired, decreased energy 0 - - - 0 - -  Change in appetite 0 - - - 0 - -  Feeling bad or failure about yourself  0 - - - 0 - -  Trouble concentrating 0 - - - 0 - -  Moving slowly or fidgety/restless 0 - - - 0 - -  Suicidal thoughts 0 - - - 0 - -  PHQ-9 Score 0 - - - 0 - -  Some recent data might be hidden     Review of Systems  Musculoskeletal: Positive for back pain.  All other systems reviewed and are negative.      Objective:   Physical Exam Vitals and nursing note reviewed.  Constitutional:      Appearance: Normal appearance.  Cardiovascular:     Rate and Rhythm: Normal rate and regular rhythm.     Pulses: Normal pulses.     Heart sounds: Normal heart sounds.  Pulmonary:     Effort: Pulmonary effort is normal.     Breath sounds: Normal breath  sounds.  Musculoskeletal:     Cervical back: Normal range of motion and neck supple.     Comments: Normal Muscle Bulk and Muscle Testing Reveals:  Upper Extremities: Full ROM and Muscle Strength 5/5 Thoracic Paraspinal Tenderness: T-7-T-9 Lumbar Paraspinal Tenderness: L-3-L-5 Lower Extremities: Full ROM and  Muscle Strength 5/5 Arises from Table with ease Narrow Based Gait   Skin:    General: Skin is warm and dry.  Neurological:     Mental Status: She is alert and oriented to person, place, and time.  Psychiatric:        Mood and Affect: Mood normal.        Behavior: Behavior normal.           Assessment & Plan:  1. Lumbar spondylosis with DDD and facet arthropathy.Left Lumbar Radiculitis: Continue HEP as Tolerated. Continue to Monitor.06/26/2020 Refilled:Hydrocodone 7.5 /325 mg one tablet every 8 hours as needed for pain #85.Second scripte-scribefor the following month. We will continue the opioid monitoring program, this consists of regular clinic visits, examinations, urine drug screen, pill counts as well as use of [place] Controlled Substance Reporting system. A 12 month History has been reviewed on the [place] on 06/26/2020. 2. Bilateral knee pain: No complaints voiced today:Continue with heat/ice, exercise and Diclofenac.06/26/2020 3. Right thalamic/internal capsule lacunar infarct with persistent left hemisensory deficits:Neurology Following.Continue to Monitor.06/26/2020 4.Bilateral Greater Trochanteric Bursitis:Continue HEP as Tolerated and Alternate Ice and Heat Therapy.Continue current medication regime.Continue to Monitor.06/26/2020.  78minutes of face to face patient care time was spent during this visit. All questions were encouraged and answered.   F/U in12months

## 2020-06-29 LAB — TOXASSURE SELECT,+ANTIDEPR,UR

## 2020-07-02 ENCOUNTER — Ambulatory Visit (INDEPENDENT_AMBULATORY_CARE_PROVIDER_SITE_OTHER): Payer: Medicare HMO | Admitting: Podiatry

## 2020-07-02 ENCOUNTER — Other Ambulatory Visit: Payer: Self-pay

## 2020-07-02 ENCOUNTER — Encounter: Payer: Self-pay | Admitting: Podiatry

## 2020-07-02 DIAGNOSIS — L600 Ingrowing nail: Secondary | ICD-10-CM

## 2020-07-02 DIAGNOSIS — Z79899 Other long term (current) drug therapy: Secondary | ICD-10-CM | POA: Diagnosis not present

## 2020-07-02 DIAGNOSIS — B351 Tinea unguium: Secondary | ICD-10-CM

## 2020-07-02 MED ORDER — GENTAMICIN SULFATE 0.1 % EX CREA
1.0000 | TOPICAL_CREAM | Freq: Two times a day (BID) | CUTANEOUS | 1 refills | Status: DC
Start: 2020-07-02 — End: 2021-09-03

## 2020-07-02 NOTE — Patient Instructions (Signed)

## 2020-07-02 NOTE — Progress Notes (Signed)
   Subjective: Patient presents today for evaluation of pain to the lateral aspect of the right great toe. Patient is concerned for possible ingrown nail.  Patient has had partial permanent nail avulsion matricectomy to the lateral border of the right hallux approximately 2 years ago.  She states that over time it slowly came back and is painful.  She also complains that she is getting thickening and discoloration to the right hallux nail plate as well.  Patient presents today for further treatment and evaluation.  Past Medical History:  Diagnosis Date  . Chronic back pain   . H/O sarcoidosis   . Hypertension   . Seasonal allergies   . Stroke [facility])     Objective:  General: Well developed, nourished, in no acute distress, alert and oriented x3   Dermatology: Skin is warm, dry and supple bilateral.  Lateral border right great toe appears to be erythematous with evidence of an ingrowing nail. Pain on palpation noted to the border of the nail fold. The remaining nails appear unremarkable at this time. There are no open sores, lesions. The entire right hallux nail plate is also discolored and dystrophic with thickening and hyperkeratosis consistent with findings of onychomycosis  Vascular: Dorsalis Pedis artery and Posterior Tibial artery pedal pulses palpable. No lower extremity edema noted.   Neruologic: Grossly intact via light touch bilateral.  Musculoskeletal: Muscular strength within normal limits in all groups bilateral. Normal range of motion noted to all pedal and ankle joints.   Assesement: #1 Paronychia with ingrowing nail lateral border right great toe #2 Pain in toe #3  Onychomycosis of toenail right hallux Plan of Care:  1. Patient evaluated.  2. Discussed treatment alternatives and plan of care. Explained nail avulsion procedure and post procedure course to patient. 3. Patient opted for permanent partial nail avulsion of the lateral border right great toe.  4. Prior to  procedure, local anesthesia infiltration utilized using 3 ml of a 50:50 mixture of 2% plain lidocaine and 0.5% plain marcaine in a normal hallux block fashion and a betadine prep performed.  5. Partial permanent nail avulsion with chemical matrixectomy performed using [id number] applications of phenol followed by alcohol flush.  6. Light dressing applied. 7.  Prescription for gentamicin cream.  Apply daily.   8.  Today we discussed different treatment options for onychomycosis of the right hallux nail plate.  Order placed today for hepatic function panel.  If within normal limits we will prescribe Lamisil 250 mg #90 daily  9.  Return to clinic 2 weeks for follow-up and to review liver function test and possibly prescribe Lamisil if LFT is WNL.  Edrick Kins, DPM [facility]  Dr. Edrick Kins, [address]                                        [place], [place] [address]                Office [phone]  Fax [phone]

## 2020-07-05 ENCOUNTER — Other Ambulatory Visit: Payer: Self-pay | Admitting: Podiatry

## 2020-07-05 ENCOUNTER — Telehealth: Payer: Self-pay | Admitting: *Deleted

## 2020-07-05 NOTE — Telephone Encounter (Signed)
Urine drug screen for this encounter is consistent for prescribed medication 

## 2020-07-06 LAB — HEPATIC FUNCTION PANEL
ALT: 11 IU/L (ref 0–32)
AST: 14 IU/L (ref 0–40)
Albumin: 4.6 g/dL (ref 3.8–4.8)
Alkaline Phosphatase: 67 IU/L (ref 44–121)
Bilirubin Total: 0.6 mg/dL (ref 0.0–1.2)
Bilirubin, Direct: 0.13 mg/dL (ref 0.00–0.40)
Total Protein: 7.7 g/dL (ref 6.0–8.5)

## 2020-07-16 ENCOUNTER — Other Ambulatory Visit: Payer: Self-pay

## 2020-07-16 ENCOUNTER — Ambulatory Visit (INDEPENDENT_AMBULATORY_CARE_PROVIDER_SITE_OTHER): Payer: Medicare HMO | Admitting: Podiatry

## 2020-07-16 DIAGNOSIS — L600 Ingrowing nail: Secondary | ICD-10-CM

## 2020-07-16 DIAGNOSIS — B351 Tinea unguium: Secondary | ICD-10-CM | POA: Diagnosis not present

## 2020-07-16 MED ORDER — TERBINAFINE HCL 250 MG PO TABS: 250.0000 mg | ORAL_TABLET | Freq: Every day | ORAL | 0 refills | Status: DC

## 2020-07-16 NOTE — Progress Notes (Signed)
   Subjective: 64 y.o. female presents today status post permanent nail avulsion procedure of the lateral border right great toe that was performed on 07/02/2020.  Patient states she is doing very well.  She does have some pain and tenderness to the area.  She also was diagnosed with onychomycosis of the toenail last visit.  She presents today to review her hepatic function panel and possibly begin Lamisil.   Past Medical History:  Diagnosis Date  . Chronic back pain   . H/O sarcoidosis   . Hypertension   . Seasonal allergies   . Stroke [facility])     Objective: Skin is warm, dry and supple. Nail and respective nail fold appears to be healing appropriately. Open wound to the associated nail fold with a granular wound base and moderate amount of fibrotic tissue. Minimal drainage noted. Mild erythema around the periungual region likely due to phenol chemical matricectomy.  Assessment: #1 postop permanent partial nail avulsion lateral border right great toe #2 open wound periungual nail fold of respective digit.  #3 onychomycosis of toenail right hallux  Plan of care: #1 patient was evaluated.  Hepatic function panel reviewed.  WNL. #2 debridement of open wound was performed to the periungual border of the respective toe using a currette. Antibiotic ointment and Band-Aid was applied. #3  Prescription for Lamisil 250 mg #90 daily  #4 patient is to return to clinic 6 months   Edrick Kins, DPM [facility]  Dr. Edrick Kins, [address]                                        [place], [place] [address]                Office [phone]  Fax [phone]

## 2020-07-23 ENCOUNTER — Other Ambulatory Visit: Payer: Self-pay

## 2020-07-23 ENCOUNTER — Ambulatory Visit
Admission: RE | Admit: 2020-07-23 | Discharge: 2020-07-23 | Disposition: A | Discharge status: Home / Self Care | Payer: Medicare HMO | Source: Ambulatory Visit | Attending: Nurse Practitioner | Admitting: Nurse Practitioner

## 2020-07-23 DIAGNOSIS — Z1231 Encounter for screening mammogram for malignant neoplasm of breast: Secondary | ICD-10-CM

## 2020-08-17 ENCOUNTER — Encounter (HOSPITAL_COMMUNITY): Payer: Self-pay

## 2020-08-17 ENCOUNTER — Other Ambulatory Visit: Payer: Self-pay

## 2020-08-17 ENCOUNTER — Ambulatory Visit (HOSPITAL_COMMUNITY)
Admission: EM | Admit: 2020-08-17 | Discharge: 2020-08-17 | Disposition: A | Discharge status: Home / Self Care | Payer: Medicare HMO | Attending: Family Medicine | Admitting: Family Medicine

## 2020-08-17 DIAGNOSIS — J3089 Other allergic rhinitis: Secondary | ICD-10-CM

## 2020-08-17 DIAGNOSIS — J329 Chronic sinusitis, unspecified: Secondary | ICD-10-CM

## 2020-08-17 DIAGNOSIS — B9789 Other viral agents as the cause of diseases classified elsewhere: Secondary | ICD-10-CM

## 2020-08-17 MED ORDER — FLUTICASONE PROPIONATE 50 MCG/ACT NA SUSP
1.0000 | Freq: Two times a day (BID) | NASAL | 2 refills | Status: DC
Start: 2020-08-17 — End: 2020-10-18

## 2020-08-17 NOTE — ED Provider Notes (Signed)
[facility]    CSN: [id number] Arrival date & time: 08/17/20  9629      History   Chief Complaint Chief Complaint  Patient presents with  . Sinus Problem  . Headache    HPI Brittany Crawford is a 64 y.o. female.   Patient presenting today with about 3 day history of sinus pressure and congestion, post nasal drainage, scratchy throat, headache. Denies fever, chills, CP, SOB, cough, abdominal pain, N/V/D. Denies any new sick contacts, recent travel. Hx of seasonal allergies, not currently on anything for these.      Past Medical History:  Diagnosis Date  . Chronic back pain   . H/O sarcoidosis   . Hypertension   . Seasonal allergies   . Stroke [facility])     Patient Active Problem List   Diagnosis Date Noted  . Vaginal atrophy 06/21/2020  . Urinary frequency 01/05/2020  . Cervical spondylosis without myelopathy 12/07/2019  . Vaginal itching 01/26/2019  . Cervicogenic headache 01/25/2019  . Stage 3 chronic kidney disease ([place]) 12/09/2018  . Acute vaginitis 09/15/2018  . Dysuria 09/15/2018  . Neuropathic pain 10/03/2015  . Chronic pain syndrome 08/03/2015  . Right-sided lacunar infarction ([place]) 12/04/2014  . Spondylosis of lumbar region without myelopathy or radiculopathy 12/04/2014  . Lumbar facet arthropathy 12/04/2014  . Marijuana abuse 07/07/2014  . Mixed hyperlipidemia 07/07/2014  . CVA (cerebral infarction) 07/06/2014  . Essential hypertension, benign 07/06/2014    Past Surgical History:  Procedure Laterality Date  . BREAST BIOPSY Left 04/29/2016  . BREAST BIOPSY Left 04/23/2016  . DILATATION & CURRETTAGE/HYSTEROSCOPY WITH RESECTOCOPE N/A 12/10/2012   Procedure: DILATATION & CURETTAGE/HYSTEROSCOPY WITH RESECTOCOPE;  Surgeon: Marvene Staff, MD;  Location: [facility];  Service: Gynecology;  Laterality: N/A;  . FOOT SURGERY     left-pins placed  . LYMPH NODE BIOPSY    . POLYPECTOMY N/A 12/10/2012   Procedure: POLYPECTOMY;  Surgeon: Marvene Staff, MD;  Location: [facility];  Service: Gynecology;  Laterality: N/A;  . STRABISMUS SURGERY Left 04/17/2017   Procedure: REPAIR STRABISMUS LEFT EYE;  Surgeon: Everitt Amber, MD;  Location: [facility];  Service: Ophthalmology;  Laterality: Left;    OB History    Gravida  5   Para  2   Term  2   Preterm      AB  3   Living  2     SAB      TAB  3   Ectopic      Multiple      Live Births               Home Medications    Prior to Admission medications   Medication Sig Start Date End Date Taking? Authorizing Provider  Ascorbic Acid (VITAMIN C) 1000 MG tablet Take 1,000 mg by mouth daily. Takes when coming down with a cold.    [provider]  aspirin 325 MG tablet Take 1 tablet (325 mg total) by mouth daily. Patient taking differently: Take 81 mg by mouth daily.  07/07/14   Bonnielee Haff, MD  conjugated estrogens (PREMARIN) vaginal cream Place 1 Applicatorful vaginally daily. 0.5 mg per vagina nightly x 2 weeks then 1-2 times per week 06/21/20   Griffin Basil, MD  diclofenac sodium (VOLTAREN) 1 % GEL Apply 2 g topically 4 (four) times daily. 08/16/18   Meredith Staggers, MD  fluticasone (FLONASE) 50 MCG/ACT nasal spray Place 1 spray into both nostrils 2 (two)  times daily. 08/17/20   Volney American, PA-C  gentamicin cream (GARAMYCIN) 0.1 % Apply 1 application topically 2 (two) times daily. 07/02/20   Edrick Kins, DPM  HYDROcodone-acetaminophen (NORCO) 7.5-325 MG tablet Take 1 tablet by mouth every 8 (eight) hours as needed for moderate pain. 06/26/20   Bayard Hugger, NP  Hypromellose (ARTIFICIAL TEARS OP) Place 1 drop into the right eye daily as needed (for dry eyes).    [provider]  losartan-hydrochlorothiazide (HYZAAR) 100-12.5 MG tablet TAKE 1 TABLET BY MOUTH EVERY DAY 02/09/20   [name], FNP  Multiple Vitamin (MULTIVITAMIN WITH MINERALS) TABS tablet Take 1 tablet by mouth daily.    [provider]    Olopatadine HCl 0.2 % SOLN Apply 1 drop to eye daily as needed for allergies. 06/18/17   [provider]  prednisoLONE acetate (PRED FORTE) 1 % ophthalmic suspension Place 1 drop into the left eye as needed (flare ups).     [provider]  terbinafine (LAMISIL) 250 MG tablet Take 1 tablet (250 mg total) by mouth daily. 07/16/20   Edrick Kins, DPM  triamcinolone cream (KENALOG) 0.1 % Apply 1 application topically at bedtime as needed (for rash). 01/05/20   [name], FNP    Family History Family History  Problem Relation Age of Onset  . Colon cancer Maternal Uncle   . Stroke Mother     Social History Social History   Tobacco Use  . Smoking status: Former Smoker    Packs/day: 0.00    Years: 0.00    Pack years: 0.00    Types: Cigarettes    Quit date: 03/13/2017    Years since quitting: 3.4  . Smokeless tobacco: Never Used  . Tobacco comment: 4  to 5  cigarettes daily for years  Vaping Use  . Vaping Use: Never used  Substance Use Topics  . Alcohol use: Not Currently    Comment: occassionally  . Drug use: Yes    Types: Hydrocodone     Allergies   Gadobenate   Review of Systems Review of Systems PER HPI    Physical Exam Triage Vital Signs ED Triage Vitals  Enc Vitals Group     BP 08/17/20 0950 123/81     Pulse Rate 08/17/20 0950 87     Resp 08/17/20 0950 16     Temp 08/17/20 0950 98.5 F (36.9 C)     Temp Source 08/17/20 0950 Oral     SpO2 08/17/20 0950 100 %     Weight --      Height --      Head Circumference --      Peak Flow --      Pain Score 08/17/20 0952 0     Pain Loc --      Pain Edu? --      Excl. in [place]? --    No data found.  Updated Vital Signs BP 123/81 (BP Location: Right Arm)   Pulse 87   Temp 98.5 F (36.9 C) (Oral)   Resp 16   LMP 11/13/2012   SpO2 100%   Visual Acuity Right Eye Distance:   Left Eye Distance:   Bilateral Distance:    Right Eye Near:   Left Eye Near:    Bilateral Near:     Physical  Exam Vitals and nursing note reviewed.  Constitutional:      Appearance: Normal appearance. She is not ill-appearing.  HENT:     Head: Atraumatic.  Right Ear: Tympanic membrane normal.     Left Ear: Tympanic membrane normal.     Nose: Rhinorrhea (and edematous nasal turbinates b/l) present.     Mouth/Throat:     Mouth: Mucous membranes are moist.     Pharynx: Posterior oropharyngeal erythema present.  Eyes:     Extraocular Movements: Extraocular movements intact.     Conjunctiva/sclera: Conjunctivae normal.  Cardiovascular:     Rate and Rhythm: Normal rate and regular rhythm.     Heart sounds: Normal heart sounds.  Pulmonary:     Effort: Pulmonary effort is normal.     Breath sounds: Normal breath sounds. No wheezing or rales.  Abdominal:     General: Bowel sounds are normal. There is no distension.     Palpations: Abdomen is soft.     Tenderness: There is no abdominal tenderness. There is no guarding.  Musculoskeletal:        General: Normal range of motion.     Cervical back: Normal range of motion and neck supple.  Skin:    General: Skin is warm and dry.  Neurological:     Mental Status: She is alert and oriented to person, place, and time.  Psychiatric:        Mood and Affect: Mood normal.        Thought Content: Thought content normal.        Judgment: Judgment normal.    UC Treatments / Results  Labs (all labs ordered are listed, but only abnormal results are displayed) Labs Reviewed - No data to display  EKG   Radiology No results found.  Procedures Procedures (including critical care time)  Medications Ordered in UC Medications - No data to display  Initial Impression / Assessment and Plan / UC Course  I have reviewed the triage vital signs and the nursing notes.  Pertinent labs & imaging results that were available during my care of the patient were reviewed by me and considered in my medical decision making (see chart for details).     Will  tx with flonase, antihistamines, supportive home care. She declines further prescription options at this time and wishes to try these more conservative methods first. Declines respiratory testing and will quarantine at home until improved. Return precautions given.  Final Clinical Impressions(s) / UC Diagnoses   Final diagnoses:  Viral sinusitis  Seasonal allergic rhinitis due to other allergic trigger   Discharge Instructions   None    ED Prescriptions    Medication Sig Dispense Auth. Provider   fluticasone (FLONASE) 50 MCG/ACT nasal spray Place 1 spray into both nostrils 2 (two) times daily. 16 g Volney American, [place]     PDMP not reviewed this encounter.   Volney American, [place] 08/17/20 1541

## 2020-08-17 NOTE — ED Triage Notes (Signed)
Pt present headache with sinus pressure around her nose and top of head. Pt states she is having a lot of pressure in her eyes. Symptom started 3 days ago.

## 2020-08-23 ENCOUNTER — Encounter: Payer: Self-pay | Admitting: Registered Nurse

## 2020-08-23 ENCOUNTER — Encounter: Payer: Medicare HMO | Attending: Registered Nurse | Admitting: Registered Nurse

## 2020-08-23 ENCOUNTER — Other Ambulatory Visit: Payer: Self-pay

## 2020-08-23 VITALS — BP 138/86 | HR 81 | Temp 98.1°F | Ht 67.5 in | Wt 210.0 lb

## 2020-08-23 DIAGNOSIS — Z79891 Long term (current) use of opiate analgesic: Secondary | ICD-10-CM | POA: Insufficient documentation

## 2020-08-23 DIAGNOSIS — Z5181 Encounter for therapeutic drug level monitoring: Secondary | ICD-10-CM | POA: Insufficient documentation

## 2020-08-23 DIAGNOSIS — M47816 Spondylosis without myelopathy or radiculopathy, lumbar region: Secondary | ICD-10-CM | POA: Diagnosis present

## 2020-08-23 DIAGNOSIS — G894 Chronic pain syndrome: Secondary | ICD-10-CM | POA: Diagnosis present

## 2020-08-23 MED ORDER — HYDROCODONE-ACETAMINOPHEN 7.5-325 MG PO TABS: 1.0000 | ORAL_TABLET | Freq: Three times a day (TID) | ORAL | 0 refills | Status: DC | PRN

## 2020-08-23 NOTE — Progress Notes (Signed)
Subjective:    Patient ID: Brittany Crawford, female    DOB: 1955/12/15, 64 y.o.   MRN: [id number]  HPI: Brittany Crawford is a 64 y.o. female who returns for follow up appointment for chronic pain and medication refill. She states her pain is located in her lower back and occasionally radiates into her bilateral lower extremities. She rates her pain 7. Her current exercise regime is walking and performing stretching exercises.  Mr. Putz Morphine equivalent is 21.25  MME.  UDS ordered today.    Pain Inventory Average Pain 7 Pain Right Now 7 My pain is intermittent, dull, tingling and aching  In the last 24 hours, has pain interfered with the following? General activity 8 Relation with others 10 Enjoyment of life 9 What TIME of day is your pain at its worst? morning  and evening Sleep (in general) Good  Pain is worse with: walking, bending and standing Pain improves with: rest, heat/ice, therapy/exercise and medication Relief from Meds: 10  Family History  Problem Relation Age of Onset  . Colon cancer Maternal Uncle   . Stroke Mother    Social History   Socioeconomic History  . Marital status: Married    Spouse name: Not on file  . Number of children: 2  . Years of education: 19  . Highest education level: Not on file  Occupational History  . Occupation: disability  Tobacco Use  . Smoking status: Former Smoker    Packs/day: 0.00    Years: 0.00    Pack years: 0.00    Types: Cigarettes    Quit date: 03/13/2017    Years since quitting: 3.4  . Smokeless tobacco: Never Used  . Tobacco comment: 4  to 5  cigarettes daily for years  Vaping Use  . Vaping Use: Never used  Substance and Sexual Activity  . Alcohol use: Not Currently    Comment: occassionally  . Drug use: Yes    Types: Hydrocodone  . Sexual activity: Yes  Other Topics Concern  . Not on file  Social History Narrative   Patient is married with 2 children.   Patient is right handed.   Patient has hs  education.   Patient drinks 4 cups daily.   Social Determinants of Health   Financial Resource Strain:   . Difficulty of Paying Living Expenses: Not on file  Food Insecurity:   . Worried About Charity fundraiser in the Last Year: Not on file  . Ran Out of Food in the Last Year: Not on file  Transportation Needs:   . Lack of Transportation (Medical): Not on file  . Lack of Transportation (Non-Medical): Not on file  Physical Activity:   . Days of Exercise per Week: Not on file  . Minutes of Exercise per Session: Not on file  Stress:   . Feeling of Stress : Not on file  Social Connections:   . Frequency of Communication with Friends and Family: Not on file  . Frequency of Social Gatherings with Friends and Family: Not on file  . Attends Religious Services: Not on file  . Active Member of Clubs or Organizations: Not on file  . Attends Archivist Meetings: Not on file  . Marital Status: Not on file   Past Surgical History:  Procedure Laterality Date  . BREAST BIOPSY Left 04/29/2016  . BREAST BIOPSY Left 04/23/2016  . DILATATION & CURRETTAGE/HYSTEROSCOPY WITH RESECTOCOPE N/A 12/10/2012   Procedure: DILATATION & CURETTAGE/HYSTEROSCOPY WITH RESECTOCOPE;  Surgeon: Marvene Staff, MD;  Location: [facility];  Service: Gynecology;  Laterality: N/A;  . FOOT SURGERY     left-pins placed  . LYMPH NODE BIOPSY    . POLYPECTOMY N/A 12/10/2012   Procedure: POLYPECTOMY;  Surgeon: Marvene Staff, MD;  Location: [facility];  Service: Gynecology;  Laterality: N/A;  . STRABISMUS SURGERY Left 04/17/2017   Procedure: REPAIR STRABISMUS LEFT EYE;  Surgeon: Everitt Amber, MD;  Location: [facility];  Service: Ophthalmology;  Laterality: Left;   Past Surgical History:  Procedure Laterality Date  . BREAST BIOPSY Left 04/29/2016  . BREAST BIOPSY Left 04/23/2016  . DILATATION & CURRETTAGE/HYSTEROSCOPY WITH RESECTOCOPE N/A 12/10/2012   Procedure: DILATATION &  CURETTAGE/HYSTEROSCOPY WITH RESECTOCOPE;  Surgeon: Marvene Staff, MD;  Location: [facility];  Service: Gynecology;  Laterality: N/A;  . FOOT SURGERY     left-pins placed  . LYMPH NODE BIOPSY    . POLYPECTOMY N/A 12/10/2012   Procedure: POLYPECTOMY;  Surgeon: Marvene Staff, MD;  Location: [facility];  Service: Gynecology;  Laterality: N/A;  . STRABISMUS SURGERY Left 04/17/2017   Procedure: REPAIR STRABISMUS LEFT EYE;  Surgeon: Everitt Amber, MD;  Location: [facility];  Service: Ophthalmology;  Laterality: Left;   Past Medical History:  Diagnosis Date  . Chronic back pain   . H/O sarcoidosis   . Hypertension   . Seasonal allergies   . Stroke ([place])    BP 138/86   Pulse 81   Temp 98.1 F (36.7 C)   Ht 5' 7.5" (1.715 m)   Wt 210 lb (95.3 kg)   LMP 11/13/2012   SpO2 96%   BMI 32.41 kg/m   Opioid Risk Score:   Fall Risk Score:  `1  Depression screen PHQ 2/9  Depression screen [facility] 2/9 06/26/2020 06/22/2020 04/30/2020 01/05/2020 07/19/2019 04/13/2019 03/24/2019  Decreased Interest 0 - 0 0 0 0 0  Down, Depressed, Hopeless 0 0 0 0 0 0 0  PHQ - 2 Score 0 0 0 0 0 0 0  Altered sleeping - 0 - - - 0 -  Tired, decreased energy - 0 - - - 0 -  Change in appetite - 0 - - - 0 -  Feeling bad or failure about yourself  - 0 - - - 0 -  Trouble concentrating - 0 - - - 0 -  Moving slowly or fidgety/restless - 0 - - - 0 -  Suicidal thoughts - 0 - - - 0 -  PHQ-9 Score - 0 - - - 0 -  Some recent data might be hidden   Review of Systems  Musculoskeletal: Positive for back pain and gait problem.       Lower back  All other systems reviewed and are negative.      Objective:   Physical Exam Vitals and nursing note reviewed.  Constitutional:      Appearance: Normal appearance.  Cardiovascular:     Rate and Rhythm: Normal rate and regular rhythm.     Pulses: Normal pulses.     Heart sounds: Normal heart sounds.  Pulmonary:     Effort: Pulmonary effort is normal.     Breath  sounds: Normal breath sounds.  Musculoskeletal:     Cervical back: Normal range of motion and neck supple.     Comments: Normal Muscle Bulk and Muscle Testing Reveals:  Upper Extremities: Full ROM and Muscle Strength 5/5  No Paraspinal Tenderness Lower Extremities: Full ROM and Muscle Strength  5/5 Arises from table with ease Narrow Based  Gait   Skin:    General: Skin is warm and dry.  Neurological:     Mental Status: She is alert and oriented to person, place, and time.  Psychiatric:        Mood and Affect: Mood normal.        Behavior: Behavior normal.           Assessment & Plan:  1. Lumbar spondylosis with DDD and facet arthropathy.Left Lumbar Radiculitis: Continue HEP as Tolerated. Continue to Monitor.08/23/2020 Refilled:Hydrocodone 7.5 /325 mg one tablet every 8 hours as needed for pain #85.Second scripte-scribefor the following month. We will continue the opioid monitoring program, this consists of regular clinic visits, examinations, urine drug screen, pill counts as well as use of [place] Controlled Substance Reporting system. A 12 month History has been reviewed on the [facility] on 08/23/2020. 2. Bilateral knee pain: No complaints voiced today:Continue with heat/ice, exercise and Diclofenac.08/23/2020 3. Right thalamic/internal capsule lacunar infarct with persistent left hemisensory deficits:Neurology Following.Continue to Monitor.08/23/2020 4.Bilateral Greater Trochanteric Bursitis:No complaints today.Continue HEP as Tolerated and Alternate Ice and Heat Therapy.Continue current medication regime.Continue to Monitor.08/23/2020.  43minutes of face to face patient care time was spent during this visit. All questions were encouraged and answered.   F/U in59months

## 2020-08-30 ENCOUNTER — Ambulatory Visit (INDEPENDENT_AMBULATORY_CARE_PROVIDER_SITE_OTHER): Payer: Medicare HMO

## 2020-08-30 ENCOUNTER — Other Ambulatory Visit: Payer: Self-pay

## 2020-08-30 VITALS — BP 138/68 | HR 98 | Temp 98.9°F | Ht 68.0 in | Wt 209.4 lb

## 2020-08-30 DIAGNOSIS — Z0000 Encounter for general adult medical examination without abnormal findings: Secondary | ICD-10-CM | POA: Diagnosis not present

## 2020-08-30 NOTE — Progress Notes (Signed)
This visit occurred during the SARS-CoV-2 public health emergency.  Safety protocols were in place, including screening questions prior to the visit, additional usage of staff PPE, and extensive cleaning of exam room while observing appropriate contact time as indicated for disinfecting solutions.  Subjective:   Brittany Crawford is a 64 y.o. female who presents for Medicare Annual (Subsequent) preventive examination.  Review of Systems     Cardiac Risk Factors include: dyslipidemia;hypertension;obesity (BMI >30kg/m2);sedentary lifestyle     Objective:    Today's Vitals   08/30/20 0910  BP: 138/68  Pulse: 98  Temp: 98.9 F (37.2 C)  TempSrc: Oral  SpO2: 95%  Weight: 209 lb 6.4 oz (95 kg)  Height: 5\' 8"  (1.727 m)   Body mass index is 31.84 kg/m.  Advanced Directives 08/30/2020 04/13/2019 07/29/2017 06/10/2017 04/17/2017 04/10/2017 02/11/2017  Does Patient Have a Medical Advance Directive? No No No No No No No  Does patient want to make changes to medical advance directive? - - - - - - Yes (MAU/Ambulatory/Procedural Areas - Information given)  Would patient like information on creating a medical advance directive? No - Patient declined No - Patient declined - - No - Patient declined - -  Pre-existing out of facility DNR order (yellow form or pink MOST form) - - - - - - -    Current Medications (verified) Outpatient Encounter Medications as of 08/30/2020  Medication Sig  . Ascorbic Acid (VITAMIN C) 1000 MG tablet Take 1,000 mg by mouth daily. Takes when coming down with a cold.  . conjugated estrogens (PREMARIN) vaginal cream Place 1 Applicatorful vaginally daily. 0.5 mg per vagina nightly x 2 weeks then 1-2 times per week  . diclofenac sodium (VOLTAREN) 1 % GEL Apply 2 g topically 4 (four) times daily.  . fluticasone (FLONASE) 50 MCG/ACT nasal spray Place 1 spray into both nostrils 2 (two) times daily.  Marland Kitchen gentamicin cream (GARAMYCIN) 0.1 % Apply 1 application topically 2 (two) times  daily.  Marland Kitchen HYDROcodone-acetaminophen (NORCO) 7.5-325 MG tablet Take 1 tablet by mouth every 8 (eight) hours as needed for moderate pain.  . Hypromellose (ARTIFICIAL TEARS OP) Place 1 drop into the right eye daily as needed (for dry eyes).  Marland Kitchen losartan-hydrochlorothiazide (HYZAAR) 100-12.5 MG tablet TAKE 1 TABLET BY MOUTH EVERY DAY  . Multiple Vitamin (MULTIVITAMIN WITH MINERALS) TABS tablet Take 1 tablet by mouth daily.  . Olopatadine HCl 0.2 % SOLN Apply 1 drop to eye daily as needed for allergies.  . prednisoLONE acetate (PRED FORTE) 1 % ophthalmic suspension Place 1 drop into the left eye as needed (flare ups).   . triamcinolone cream (KENALOG) 0.1 % Apply 1 application topically at bedtime as needed (for rash).  Marland Kitchen aspirin 325 MG tablet Take 1 tablet (325 mg total) by mouth daily. (Patient not taking: Reported on 08/30/2020)  . terbinafine (LAMISIL) 250 MG tablet Take 1 tablet (250 mg total) by mouth daily. (Patient not taking: Reported on 08/30/2020)   No facility-administered encounter medications on file as of 08/30/2020.    Allergies (verified) Gadobenate   History: Past Medical History:  Diagnosis Date  . Chronic back pain   . H/O sarcoidosis   . Hypertension   . Seasonal allergies   . Stroke [facility])    Past Surgical History:  Procedure Laterality Date  . BREAST BIOPSY Left 04/29/2016  . BREAST BIOPSY Left 04/23/2016  . DILATATION & CURRETTAGE/HYSTEROSCOPY WITH RESECTOCOPE N/A 12/10/2012   Procedure: DILATATION & CURETTAGE/HYSTEROSCOPY WITH RESECTOCOPE;  Surgeon: Alanda Slim  Clint Bolder, MD;  Location: [facility];  Service: Gynecology;  Laterality: N/A;  . FOOT SURGERY     left-pins placed  . LYMPH NODE BIOPSY    . POLYPECTOMY N/A 12/10/2012   Procedure: POLYPECTOMY;  Surgeon: Marvene Staff, MD;  Location: [facility];  Service: Gynecology;  Laterality: N/A;  . STRABISMUS SURGERY Left 04/17/2017   Procedure: REPAIR STRABISMUS LEFT EYE;  Surgeon: Everitt Amber, MD;  Location: [facility];  Service: Ophthalmology;  Laterality: Left;   Family History  Problem Relation Age of Onset  . Colon cancer Maternal Uncle   . Stroke Mother    Social History   Socioeconomic History  . Marital status: Married    Spouse name: Not on file  . Number of children: 2  . Years of education: 47  . Highest education level: Not on file  Occupational History  . Occupation: disability  Tobacco Use  . Smoking status: Former Smoker    Packs/day: 0.00    Years: 0.00    Pack years: 0.00    Types: Cigarettes    Quit date: 03/13/2017    Years since quitting: 3.4  . Smokeless tobacco: Never Used  . Tobacco comment: 4  to 5  cigarettes daily for years  Vaping Use  . Vaping Use: Never used  Substance and Sexual Activity  . Alcohol use: Not Currently    Comment: occassionally  . Drug use: Yes    Types: Hydrocodone  . Sexual activity: Yes  Other Topics Concern  . Not on file  Social History Narrative   Patient is married with 2 children.   Patient is right handed.   Patient has hs education.   Patient drinks 4 cups daily.   Social Determinants of Health   Financial Resource Strain: Low Risk   . Difficulty of Paying Living Expenses: Not hard at all  Food Insecurity: No Food Insecurity  . Worried About Charity fundraiser in the Last Year: Never true  . Ran Out of Food in the Last Year: Never true  Transportation Needs: No Transportation Needs  . Lack of Transportation (Medical): No  . Lack of Transportation (Non-Medical): No  Physical Activity: Inactive  . Days of Exercise per Week: 0 days  . Minutes of Exercise per Session: 0 min  Stress: No Stress Concern Present  . Feeling of Stress : Not at all  Social Connections:   . Frequency of Communication with Friends and Family: Not on file  . Frequency of Social Gatherings with Friends and Family: Not on file  . Attends Religious Services: Not on file  . Active Member of Clubs or Organizations: Not on file  .  Attends Archivist Meetings: Not on file  . Marital Status: Not on file    Tobacco Counseling Counseling given: Not Answered Comment: 4  to 5  cigarettes daily for years   Clinical Intake:  Pre-visit preparation completed: Yes  Pain : No/denies pain     Nutritional Status: BMI > 30  Obese Nutritional Risks: None Diabetes: No  How often do you need to have someone help you when you read instructions, pamphlets, or other written materials from your doctor or pharmacy?: 1 - Never What is the last grade level you completed in school?: 12th grade  Diabetic? no  Interpreter Needed?: No  Information entered by :: NAllen LPN   Activities of Daily Living In your present state of health, do you have any difficulty performing the  following activities: 08/30/2020 04/23/2020  Hearing? N N  Vision? Y N  Difficulty concentrating or making decisions? N N  Walking or climbing stairs? N N  Dressing or bathing? N N  Doing errands, shopping? N N  Preparing Food and eating ? N -  Using the Toilet? N -  In the past six months, have you accidently leaked urine? N -  Do you have problems with loss of bowel control? N -  Managing your Medications? N -  Managing your Finances? N -  Some recent data might be hidden    Patient Care Team: [name], FNP as PCP - General (General Practice)  Indicate any recent Medical Services you may have received from other than [facility] providers in the past year (date may be approximate).     Assessment:   This is a routine wellness examination for Jasira.  Hearing/Vision screen  Hearing Screening   125Hz  250Hz  500Hz  1000Hz  2000Hz  3000Hz  4000Hz  6000Hz  8000Hz   Right ear:           Left ear:           Vision Screening Comments: Regular eye exams, Dr. Prudencio Burly  Dietary issues and exercise activities discussed: Current Exercise Habits: The patient does not participate in regular exercise at present  Goals    . Patient Stated     Wants to  get off sugar. Eat better    . Patient Stated     08/30/2020, just maintain      Depression Screen PHQ 2/9 Scores 08/30/2020 08/23/2020 06/26/2020 06/22/2020 04/30/2020 01/05/2020 07/19/2019  PHQ - 2 Score 0 0 0 0 0 0 0  PHQ- 9 Score - - - 0 - - -    Fall Risk Fall Risk  08/30/2020 08/23/2020 06/26/2020 04/30/2020 03/06/2020  Falls in the past year? 0 0 0 0 0  Number falls in past yr: - 0 - 0 -  Injury with Fall? - 0 - 0 -  Risk for fall due to : Medication side effect - - - -  Follow up Falls evaluation completed;Education provided;Falls prevention discussed - - - -    Any stairs in or around the home? Yes  If so, are there any without handrails? No  Home free of loose throw rugs in walkways, pet beds, electrical cords, etc? Yes  Adequate lighting in your home to reduce risk of falls? Yes   ASSISTIVE DEVICES UTILIZED TO PREVENT FALLS:  Life alert? No  Use of a cane, walker or w/c? No  Grab bars in the bathroom? Yes  Shower chair or bench in shower? No  Elevated toilet seat or a handicapped toilet? Yes   TIMED UP AND GO:  Was the test performed? No .    Gait steady and fast without use of assistive device  Cognitive Function:     6CIT Screen 08/30/2020 04/13/2019  What Year? 0 points 0 points  What month? 0 points 0 points  What time? 0 points 0 points  Count back from 20 2 points 0 points  Months in reverse 2 points 2 points  Repeat phrase 4 points 2 points  Total Score 8 4    Immunizations Immunization History  Administered Date(s) Administered  . Influenza,inj,Quad PF,6+ Mos 07/08/2014, 09/07/2019  . Influenza-Unspecified 08/11/2018  . Moderna SARS-COVID-2 Vaccination 12/30/2019, 01/30/2020  . Pneumococcal Polysaccharide-23 07/08/2014    TDAP status: Due, Education has been provided regarding the importance of this vaccine. Advised may receive this vaccine at local pharmacy or  Health Dept. Aware to provide a copy of the vaccination record if obtained from local  pharmacy or Health Dept. Verbalized acceptance and understanding. Flu Vaccine status: Declined, Education has been provided regarding the importance of this vaccine but patient still declined. Advised may receive this vaccine at local pharmacy or Health Dept. Aware to provide a copy of the vaccination record if obtained from local pharmacy or Health Dept. Verbalized acceptance and understanding. Pneumococcal vaccine status: Up to date Covid-19 vaccine status: Completed vaccines  Qualifies for Shingles Vaccine? Yes   Zostavax completed No   Shingrix Completed?: No.    Education has been provided regarding the importance of this vaccine. Patient has been advised to call insurance company to determine out of pocket expense if they have not yet received this vaccine. Advised may also receive vaccine at local pharmacy or Health Dept. Verbalized acceptance and understanding.  Screening Tests Health Maintenance  Topic Date Due  . INFLUENZA VACCINE  01/10/2021 (Originally 05/13/2020)  . TETANUS/TDAP  08/30/2021 (Originally [birth year]1977)  . COLONOSCOPY  07/05/2022  . MAMMOGRAM  07/23/2022  . PAP SMEAR-Modifier  09/06/2022  . COVID-19 Vaccine  Completed  . Hepatitis C Screening  Completed  . HIV Screening  Completed    Health Maintenance  There are no preventive care reminders to display for this patient.  Colorectal cancer screening: Completed 07/05/2012. Repeat every 10 years Mammogram status: Completed 07/23/2020. Repeat every year Bone Density status: n/a  Lung Cancer Screening: (Low Dose CT Chest recommended if Age 61-80 years, 30 pack-year currently smoking OR have quit w/in 15years.) does not qualify.   Lung Cancer Screening Referral: no  Additional Screening:  Hepatitis C Screening: does qualify; Completed 09/01/2018  Vision Screening: Recommended annual ophthalmology exams for early detection of glaucoma and other disorders of the eye. Is the patient up to date with their annual eye  exam?  Yes  Who is the provider or what is the name of the office in which the patient attends annual eye exams? Dr. Prudencio Burly If pt is not established with a provider, would they like to be referred to a provider to establish care? No .   Dental Screening: Recommended annual dental exams for proper oral hygiene  Community Resource Referral / Chronic Care Management: CRR required this visit?  No   CCM required this visit?  No      Plan:     I have personally reviewed and noted the following in the patient's chart:   . Medical and social history . Use of alcohol, tobacco or illicit drugs  . Current medications and supplements . Functional ability and status . Nutritional status . Physical activity . Advanced directives . List of other physicians . Hospitalizations, surgeries, and ER visits in previous 12 months . Vitals . Screenings to include cognitive, depression, and falls . Referrals and appointments  In addition, I have reviewed and discussed with patient certain preventive protocols, quality metrics, and best practice recommendations. A written personalized care plan for preventive services as well as general preventive health recommendations were provided to patient.     Kellie Simmering, LPN   [id number]   Nurse Notes:

## 2020-08-30 NOTE — Patient Instructions (Signed)
Brittany Crawford , Thank you for taking time to come for your Medicare Wellness Visit. I appreciate your ongoing commitment to your health goals. Please review the following plan we discussed and let me know if I can assist you in the future.   Screening recommendations/referrals: Colonoscopy: completed 07/05/2012 Mammogram: completed 07/23/2020 Bone Density: n/a Recommended yearly ophthalmology/optometry visit for glaucoma screening and checkup Recommended yearly dental visit for hygiene and checkup  Vaccinations: Influenza vaccine: decline Pneumococcal vaccine: completed 07/08/2014 Tdap vaccine: decline Shingles vaccine: decline  Covid-19:  01/30/2020, 12/30/2019  Advanced directives: Advance directive discussed with you today. Even though you declined this today please call our office should you change your mind and we can give you the proper paperwork for you to fill out.   Conditions/risks identified: none  Next appointment: 09/12/2020 at 10:00 Follow up in one year for your annual wellness visit.   Preventive Care 40-64 Years, Female Preventive care refers to lifestyle choices and visits with your health care provider that can promote health and wellness. What does preventive care include?  A yearly physical exam. This is also called an annual well check.  Dental exams once or twice a year.  Routine eye exams. Ask your health care provider how often you should have your eyes checked.  Personal lifestyle choices, including:  Daily care of your teeth and gums.  Regular physical activity.  Eating a healthy diet.  Avoiding tobacco and drug use.  Limiting alcohol use.  Practicing safe sex.  Taking low-dose aspirin daily starting at age 15.  Taking vitamin and mineral supplements as recommended by your health care provider. What happens during an annual well check? The services and screenings done by your health care provider during your annual well check will depend on  your age, overall health, lifestyle risk factors, and family history of disease. Counseling  Your health care provider may ask you questions about your:  Alcohol use.  Tobacco use.  Drug use.  Emotional well-being.  Home and relationship well-being.  Sexual activity.  Eating habits.  Work and work Statistician.  Method of birth control.  Menstrual cycle.  Pregnancy history. Screening  You may have the following tests or measurements:  Height, weight, and BMI.  Blood pressure.  Lipid and cholesterol levels. These may be checked every 5 years, or more frequently if you are over 83 years old.  Skin check.  Lung cancer screening. You may have this screening every year starting at age 19 if you have a 30-pack-year history of smoking and currently smoke or have quit within the past 15 years.  Fecal occult blood test (FOBT) of the stool. You may have this test every year starting at age 60.  Flexible sigmoidoscopy or colonoscopy. You may have a sigmoidoscopy every 5 years or a colonoscopy every 10 years starting at age 24.  Hepatitis C blood test.  Hepatitis B blood test.  Sexually transmitted disease (STD) testing.  Diabetes screening. This is done by checking your blood sugar (glucose) after you have not eaten for a while (fasting). You may have this done every 1-3 years.  Mammogram. This may be done every 1-2 years. Talk to your health care provider about when you should start having regular mammograms. This may depend on whether you have a family history of breast cancer.  BRCA-related cancer screening. This may be done if you have a family history of breast, ovarian, tubal, or peritoneal cancers.  Pelvic exam and Pap test. This may be done every  3 years starting at age 71. Starting at age 46, this may be done every 5 years if you have a Pap test in combination with an HPV test.  Bone density scan. This is done to screen for osteoporosis. You may have this scan if  you are at high risk for osteoporosis. Discuss your test results, treatment options, and if necessary, the need for more tests with your health care provider. Vaccines  Your health care provider may recommend certain vaccines, such as:  Influenza vaccine. This is recommended every year.  Tetanus, diphtheria, and acellular pertussis (Tdap, Td) vaccine. You may need a Td booster every 10 years.  Zoster vaccine. You may need this after age 50.  Pneumococcal 13-valent conjugate (PCV13) vaccine. You may need this if you have certain conditions and were not previously vaccinated.  Pneumococcal polysaccharide (PPSV23) vaccine. You may need one or two doses if you smoke cigarettes or if you have certain conditions. Talk to your health care provider about which screenings and vaccines you need and how often you need them. This information is not intended to replace advice given to you by your health care provider. Make sure you discuss any questions you have with your health care provider. Document Released: 10/26/2015 Document Revised: 06/18/2016 Document Reviewed: 07/31/2015 Elsevier Interactive Patient Education  2017 [employer] Prevention in the Home Falls can cause injuries. They can happen to people of all ages. There are many things you can do to make your home safe and to help prevent falls. What can I do on the outside of my home?  Regularly fix the edges of walkways and driveways and fix any cracks.  Remove anything that might make you trip as you walk through a door, such as a raised step or threshold.  Trim any bushes or trees on the path to your home.  Use bright outdoor lighting.  Clear any walking paths of anything that might make someone trip, such as rocks or tools.  Regularly check to see if handrails are loose or broken. Make sure that both sides of any steps have handrails.  Any raised decks and porches should have guardrails on the edges.  Have any  leaves, snow, or ice cleared regularly.  Use sand or salt on walking paths during winter.  Clean up any spills in your garage right away. This includes oil or grease spills. What can I do in the bathroom?  Use night lights.  Install grab bars by the toilet and in the tub and shower. Do not use towel bars as grab bars.  Use non-skid mats or decals in the tub or shower.  If you need to sit down in the shower, use a plastic, non-slip stool.  Keep the floor dry. Clean up any water that spills on the floor as soon as it happens.  Remove soap buildup in the tub or shower regularly.  Attach bath mats securely with double-sided non-slip rug tape.  Do not have throw rugs and other things on the floor that can make you trip. What can I do in the bedroom?  Use night lights.  Make sure that you have a light by your bed that is easy to reach.  Do not use any sheets or blankets that are too big for your bed. They should not hang down onto the floor.  Have a firm chair that has side arms. You can use this for support while you get dressed.  Do not have throw  rugs and other things on the floor that can make you trip. What can I do in the kitchen?  Clean up any spills right away.  Avoid walking on wet floors.  Keep items that you use a lot in easy-to-reach places.  If you need to reach something above you, use a strong step stool that has a grab bar.  Keep electrical cords out of the way.  Do not use floor polish or wax that makes floors slippery. If you must use wax, use non-skid floor wax.  Do not have throw rugs and other things on the floor that can make you trip. What can I do with my stairs?  Do not leave any items on the stairs.  Make sure that there are handrails on both sides of the stairs and use them. Fix handrails that are broken or loose. Make sure that handrails are as long as the stairways.  Check any carpeting to make sure that it is firmly attached to the stairs.  Fix any carpet that is loose or worn.  Avoid having throw rugs at the top or bottom of the stairs. If you do have throw rugs, attach them to the floor with carpet tape.  Make sure that you have a light switch at the top of the stairs and the bottom of the stairs. If you do not have them, ask someone to add them for you. What else can I do to help prevent falls?  Wear shoes that:  Do not have high heels.  Have rubber bottoms.  Are comfortable and fit you well.  Are closed at the toe. Do not wear sandals.  If you use a stepladder:  Make sure that it is fully opened. Do not climb a closed stepladder.  Make sure that both sides of the stepladder are locked into place.  Ask someone to hold it for you, if possible.  Clearly mark and make sure that you can see:  Any grab bars or handrails.  First and last steps.  Where the edge of each step is.  Use tools that help you move around (mobility aids) if they are needed. These include:  Canes.  Walkers.  Scooters.  Crutches.  Turn on the lights when you go into a dark area. Replace any light bulbs as soon as they burn out.  Set up your furniture so you have a clear path. Avoid moving your furniture around.  If any of your floors are uneven, fix them.  If there are any pets around you, be aware of where they are.  Review your medicines with your doctor. Some medicines can make you feel dizzy. This can increase your chance of falling. Ask your doctor what other things that you can do to help prevent falls. This information is not intended to replace advice given to you by your health care provider. Make sure you discuss any questions you have with your health care provider. Document Released: 07/26/2009 Document Revised: 03/06/2016 Document Reviewed: 11/03/2014 Elsevier Interactive Patient Education  2017 [employer].

## 2020-09-02 LAB — TOXASSURE SELECT,+ANTIDEPR,UR

## 2020-09-12 ENCOUNTER — Encounter: Payer: Medicare HMO | Admitting: Nurse Practitioner

## 2020-09-29 ENCOUNTER — Encounter (HOSPITAL_COMMUNITY): Payer: Self-pay | Admitting: *Deleted

## 2020-09-29 ENCOUNTER — Ambulatory Visit (HOSPITAL_COMMUNITY)
Admission: EM | Admit: 2020-09-29 | Discharge: 2020-09-29 | Disposition: A | Discharge status: Home / Self Care | Payer: Medicare HMO | Attending: Family Medicine | Admitting: Family Medicine

## 2020-09-29 ENCOUNTER — Other Ambulatory Visit: Payer: Self-pay

## 2020-09-29 DIAGNOSIS — H66002 Acute suppurative otitis media without spontaneous rupture of ear drum, left ear: Secondary | ICD-10-CM

## 2020-09-29 DIAGNOSIS — H6506 Acute serous otitis media, recurrent, bilateral: Secondary | ICD-10-CM | POA: Diagnosis not present

## 2020-09-29 MED ORDER — AMOXICILLIN 875 MG PO TABS
875.0000 mg | ORAL_TABLET | Freq: Two times a day (BID) | ORAL | 0 refills | Status: DC
Start: 2020-09-29 — End: 2020-10-10

## 2020-09-29 NOTE — ED Provider Notes (Signed)
[facility]    CSN: [id number] Arrival date & time: 09/29/20  1022      History   Chief Complaint Chief Complaint  Patient presents with  . Otalgia    Both ears    HPI Brittany Crawford is a 64 y.o. female.   Here today with 3 day history of progressively worsening b/l ear pain, headache. Denies fever, chills, congestion, muffled hearing, dizziness, drainage from ears. Was seen 3 weeks ago for similar issue, given flonase which she feels made things worse and not better. OTC pain relievers, heating pads not well controlling sxs.      Past Medical History:  Diagnosis Date  . Chronic back pain   . H/O sarcoidosis   . Hypertension   . Seasonal allergies   . Stroke [facility])     Patient Active Problem List   Diagnosis Date Noted  . Vaginal atrophy 06/21/2020  . Urinary frequency 01/05/2020  . Cervical spondylosis without myelopathy 12/07/2019  . Vaginal itching 01/26/2019  . Cervicogenic headache 01/25/2019  . Stage 3 chronic kidney disease ([place]) 12/09/2018  . Acute vaginitis 09/15/2018  . Dysuria 09/15/2018  . Neuropathic pain 10/03/2015  . Chronic pain syndrome 08/03/2015  . Right-sided lacunar infarction ([place]) 12/04/2014  . Spondylosis of lumbar region without myelopathy or radiculopathy 12/04/2014  . Lumbar facet arthropathy 12/04/2014  . Marijuana abuse 07/07/2014  . Mixed hyperlipidemia 07/07/2014  . CVA (cerebral infarction) 07/06/2014  . Essential hypertension, benign 07/06/2014    Past Surgical History:  Procedure Laterality Date  . BREAST BIOPSY Left 04/29/2016  . BREAST BIOPSY Left 04/23/2016  . DILATATION & CURRETTAGE/HYSTEROSCOPY WITH RESECTOCOPE N/A 12/10/2012   Procedure: DILATATION & CURETTAGE/HYSTEROSCOPY WITH RESECTOCOPE;  Surgeon: Marvene Staff, MD;  Location: [facility];  Service: Gynecology;  Laterality: N/A;  . FOOT SURGERY     left-pins placed  . LYMPH NODE BIOPSY    . POLYPECTOMY N/A 12/10/2012   Procedure: POLYPECTOMY;   Surgeon: Marvene Staff, MD;  Location: [facility];  Service: Gynecology;  Laterality: N/A;  . STRABISMUS SURGERY Left 04/17/2017   Procedure: REPAIR STRABISMUS LEFT EYE;  Surgeon: Everitt Amber, MD;  Location: [facility];  Service: Ophthalmology;  Laterality: Left;    OB History    Gravida  5   Para  2   Term  2   Preterm      AB  3   Living  2     SAB      IAB  3   Ectopic      Multiple      Live Births               Home Medications    Prior to Admission medications   Medication Sig Start Date End Date Taking? Authorizing Provider  Ascorbic Acid (VITAMIN C) 1000 MG tablet Take 1,000 mg by mouth daily. Takes when coming down with a cold.   Yes [provider]  conjugated estrogens (PREMARIN) vaginal cream Place 1 Applicatorful vaginally daily. 0.5 mg per vagina nightly x 2 weeks then 1-2 times per week 06/21/20  Yes Griffin Basil, MD  HYDROcodone-acetaminophen (NORCO) 7.5-325 MG tablet Take 1 tablet by mouth every 8 (eight) hours as needed for moderate pain. 08/23/20  Yes Bayard Hugger, NP  Hypromellose (ARTIFICIAL TEARS OP) Place 1 drop into the right eye daily as needed (for dry eyes).   Yes [provider]  losartan-hydrochlorothiazide (HYZAAR) 100-12.5 MG tablet TAKE 1 TABLET  BY MOUTH EVERY DAY 02/09/20  Yes [name], FNP  Multiple Vitamin (MULTIVITAMIN WITH MINERALS) TABS tablet Take 1 tablet by mouth daily.   Yes [provider]  Olopatadine HCl 0.2 % SOLN Apply 1 drop to eye daily as needed for allergies. 06/18/17  Yes [provider]  prednisoLONE acetate (PRED FORTE) 1 % ophthalmic suspension Place 1 drop into the left eye as needed (flare ups).    Yes [provider]  amoxicillin (AMOXIL) 875 MG tablet Take 1 tablet (875 mg total) by mouth 2 (two) times daily. 09/29/20   Volney American, PA-C  aspirin 325 MG tablet Take 1 tablet (325 mg total) by mouth daily. Patient not taking:  Reported on 08/30/2020 07/07/14   Bonnielee Haff, MD  diclofenac sodium (VOLTAREN) 1 % GEL Apply 2 g topically 4 (four) times daily. 08/16/18   Meredith Staggers, MD  fluticasone (FLONASE) 50 MCG/ACT nasal spray Place 1 spray into both nostrils 2 (two) times daily. 08/17/20   Volney American, PA-C  gentamicin cream (GARAMYCIN) 0.1 % Apply 1 application topically 2 (two) times daily. 07/02/20   Edrick Kins, DPM  terbinafine (LAMISIL) 250 MG tablet Take 1 tablet (250 mg total) by mouth daily. Patient not taking: Reported on 08/30/2020 07/16/20   Edrick Kins, DPM  triamcinolone cream (KENALOG) 0.1 % Apply 1 application topically at bedtime as needed (for rash). 01/05/20   [name], FNP    Family History Family History  Problem Relation Age of Onset  . Colon cancer Maternal Uncle   . Stroke Mother     Social History Social History   Tobacco Use  . Smoking status: Former Smoker    Packs/day: 0.00    Years: 0.00    Pack years: 0.00    Types: Cigarettes    Quit date: 03/13/2017    Years since quitting: 3.5  . Smokeless tobacco: Never Used  . Tobacco comment: 4  to 5  cigarettes daily for years  Vaping Use  . Vaping Use: Never used  Substance Use Topics  . Alcohol use: Not Currently    Comment: occassionally  . Drug use: Yes    Types: Hydrocodone     Allergies   Gadobenate   Review of Systems Review of Systems PER HPI    Physical Exam Triage Vital Signs ED Triage Vitals  Enc Vitals Group     BP 09/29/20 1130 (!) 137/96     Pulse Rate 09/29/20 1130 77     Resp 09/29/20 1130 20     Temp 09/29/20 1130 99 F (37.2 C)     Temp Source 09/29/20 1130 Oral     SpO2 --      Weight 09/29/20 1132 203 lb (92.1 kg)     Height 09/29/20 1132 5' 7.5" (1.715 m)     Head Circumference --      Peak Flow --      Pain Score 09/29/20 1131 10     Pain Loc --      Pain Edu? --      Excl. in [place]? --    No data found.  Updated Vital Signs BP (!) 137/96 (BP Location:  Right Arm)   Pulse 77   Temp 99 F (37.2 C) (Oral)   Resp 20   Ht 5' 7.5" (1.715 m)   Wt 203 lb (92.1 kg)   LMP 11/13/2012   BMI 31.33 kg/m   Visual Acuity Right Eye Distance:  Left Eye Distance:   Bilateral Distance:    Right Eye Near:   Left Eye Near:    Bilateral Near:     Physical Exam Vitals and nursing note reviewed.  Constitutional:      Appearance: Normal appearance. She is not ill-appearing.  HENT:     Head: Atraumatic.     Ears:     Comments: Right middle ear effusion present, left TM erythematous and mildly bulging    Nose:     Comments: Nasal mucosa erythematous and edematous b/l     Mouth/Throat:     Mouth: Mucous membranes are moist.     Pharynx: Oropharynx is clear.  Eyes:     Extraocular Movements: Extraocular movements intact.     Conjunctiva/sclera: Conjunctivae normal.     Pupils: Pupils are equal, round, and reactive to light.  Cardiovascular:     Rate and Rhythm: Normal rate and regular rhythm.     Heart sounds: Normal heart sounds.  Pulmonary:     Effort: Pulmonary effort is normal.     Breath sounds: Normal breath sounds.  Musculoskeletal:        General: Normal range of motion.     Cervical back: Normal range of motion and neck supple.  Skin:    General: Skin is warm and dry.  Neurological:     Mental Status: She is alert and oriented to person, place, and time.  Psychiatric:        Mood and Affect: Mood normal.        Thought Content: Thought content normal.        Judgment: Judgment normal.      UC Treatments / Results  Labs (all labs ordered are listed, but only abnormal results are displayed) Labs Reviewed - No data to display  EKG   Radiology No results found.  Procedures Procedures (including critical care time)  Medications Ordered in UC Medications - No data to display  Initial Impression / Assessment and Plan / UC Course  I have reviewed the triage vital signs and the nursing notes.  Pertinent labs &  imaging results that were available during my care of the patient were reviewed by me and considered in my medical decision making (see chart for details).     Discussed amoxil for mild early stage ear infection, but that this would not clear her eustachian tube issues, ongoing effusions which are likely more responsible for her pain sxs. Offered astelin as she refuses steroid nasal sprays and suggested a good oral allergy regimen, short prednisone burst but she refuses all of these today. Has f/u next week with PCP and will speak with them about these options. Continue OTC pain relievers, heat as needed.   Final Clinical Impressions(s) / UC Diagnoses   Final diagnoses:  Recurrent acute serous otitis media of both ears  Acute suppurative otitis media of left ear without spontaneous rupture of tympanic membrane, recurrence not specified   Discharge Instructions   None    ED Prescriptions    Medication Sig Dispense Auth. Provider   amoxicillin (AMOXIL) 875 MG tablet Take 1 tablet (875 mg total) by mouth 2 (two) times daily. 20 tablet Volney American, [place]     PDMP not reviewed this encounter.   Volney American, [place] 09/29/20 1318

## 2020-09-29 NOTE — ED Triage Notes (Signed)
Pt reports sharp pain in both ears for 3 days.

## 2020-10-10 ENCOUNTER — Ambulatory Visit (INDEPENDENT_AMBULATORY_CARE_PROVIDER_SITE_OTHER): Payer: Medicare HMO | Admitting: Nurse Practitioner

## 2020-10-10 ENCOUNTER — Encounter: Payer: Self-pay | Admitting: Nurse Practitioner

## 2020-10-10 ENCOUNTER — Other Ambulatory Visit: Payer: Self-pay

## 2020-10-10 VITALS — BP 138/82 | HR 94 | Temp 99.3°F

## 2020-10-10 DIAGNOSIS — J329 Chronic sinusitis, unspecified: Secondary | ICD-10-CM | POA: Diagnosis not present

## 2020-10-10 DIAGNOSIS — J3489 Other specified disorders of nose and nasal sinuses: Secondary | ICD-10-CM | POA: Diagnosis not present

## 2020-10-10 DIAGNOSIS — Z1152 Encounter for screening for COVID-19: Secondary | ICD-10-CM

## 2020-10-10 MED ORDER — LOSARTAN POTASSIUM-HCTZ 100-12.5 MG PO TABS: 1.0000 | ORAL_TABLET | Freq: Every day | ORAL | 1 refills | Status: DC

## 2020-10-10 MED ORDER — AMOXICILLIN-POT CLAVULANATE 875-125 MG PO TABS
1.0000 | ORAL_TABLET | Freq: Two times a day (BID) | ORAL | 0 refills | Status: DC
Start: 2020-10-10 — End: 2020-10-18

## 2020-10-10 NOTE — Progress Notes (Signed)
I,Yamilka Roman [employer] as a Neurosurgeon for [employer], FNP.,have documented all relevant documentation on the behalf of Arnette Felts, FNP,as directed by  Arnette Felts, FNP while in the presence of Arnette Felts, FNP. This visit occurred during the SARS-CoV-2 public health emergency.  Safety protocols were in place, including screening questions prior to the visit, additional usage of staff PPE, and extensive cleaning of exam room while observing appropriate contact time as indicated for disinfecting solutions.  Subjective:     Patient ID: Brittany Crawford , female    DOB: 03-13-1956 , 64 y.o.   MRN: [id number]   Chief Complaint  Patient presents with  . Sinus Problem    Patient stated she started feeling bad about 3 weeks ago. She has been having some sinus issues.     HPI  She has been using sweet oil and flonase nasal spray.  Would go away and come back was waking her in the middle of her sleep .  She has been seen at the Urgent Care.    Sinus Problem This is a new problem. The current episode started more than 1 month ago. The problem has been gradually worsening since onset. There has been no fever. She is experiencing no pain. Associated symptoms include sinus pressure. Pertinent negatives include no chills, congestion, coughing, shortness of breath, sore throat or swollen glands. Past treatments include spray decongestants and antibiotics.     Past Medical History:  Diagnosis Date  . Chronic back pain   . H/O sarcoidosis   . Hypertension   . Seasonal allergies   . Stroke [facility])      Family History  Problem Relation Age of Onset  . Colon cancer Maternal Uncle   . Stroke Mother      Current Outpatient Medications:  .  amoxicillin-clavulanate (AUGMENTIN) 875-125 MG tablet, Take 1 tablet by mouth 2 (two) times daily for 10 days., Disp: 20 tablet, Rfl: 0 .  Ascorbic Acid (VITAMIN C) 1000 MG tablet, Take 1,000 mg by mouth daily. Takes when coming down with a cold., Disp:  , Rfl:  .  conjugated estrogens (PREMARIN) vaginal cream, Place 1 Applicatorful vaginally daily. 0.5 mg per vagina nightly x 2 weeks then 1-2 times per week, Disp: 42.5 g, Rfl: 3 .  diclofenac sodium (VOLTAREN) 1 % GEL, Apply 2 g topically 4 (four) times daily., Disp: 300 g, Rfl: 2 .  fluticasone (FLONASE) 50 MCG/ACT nasal spray, Place 1 spray into both nostrils 2 (two) times daily., Disp: 16 g, Rfl: 2 .  gentamicin cream (GARAMYCIN) 0.1 %, Apply 1 application topically 2 (two) times daily., Disp: 15 g, Rfl: 1 .  HYDROcodone-acetaminophen (NORCO) 7.5-325 MG tablet, Take 1 tablet by mouth every 8 (eight) hours as needed for moderate pain., Disp: 85 tablet, Rfl: 0 .  Hypromellose (ARTIFICIAL TEARS OP), Place 1 drop into the right eye daily as needed (for dry eyes)., Disp: , Rfl:  .  losartan-hydrochlorothiazide (HYZAAR) 100-12.5 MG tablet, TAKE 1 TABLET BY MOUTH EVERY DAY, Disp: 90 tablet, Rfl: 1 .  Multiple Vitamin (MULTIVITAMIN WITH MINERALS) TABS tablet, Take 1 tablet by mouth daily., Disp: , Rfl:  .  Olopatadine HCl 0.2 % SOLN, Apply 1 drop to eye daily as needed for allergies., Disp: , Rfl: 3 .  prednisoLONE acetate (PRED FORTE) 1 % ophthalmic suspension, Place 1 drop into the left eye as needed (flare ups). , Disp: , Rfl:  .  triamcinolone cream (KENALOG) 0.1 %, Apply 1 application topically at bedtime  as needed (for rash)., Disp: 30 g, Rfl: 1   Allergies  Allergen Reactions  . Gadobenate Nausea And Vomiting    Pt was fine after a few minutes , vomiting after contrast injection smills      Review of Systems  Constitutional: Negative.  Negative for chills.  HENT: Positive for sinus pressure. Negative for congestion and sore throat.   Respiratory: Negative.  Negative for cough and shortness of breath.   Cardiovascular: Negative.   Psychiatric/Behavioral: Negative.      Today's Vitals   10/10/20 1541  BP: 138/82  Pulse: 94  Temp: 99.3 F (37.4 C)   There is no height or weight on  file to calculate BMI.   Objective:  Physical Exam Constitutional:      General: She is not in acute distress.    Appearance: Normal appearance.  HENT:     Right Ear: Tympanic membrane, ear canal and external ear normal. There is no impacted cerumen.     Left Ear: Tympanic membrane, ear canal and external ear normal. There is no impacted cerumen.     Nose: No congestion.     Right Sinus: Maxillary sinus tenderness and frontal sinus tenderness present.     Left Sinus: Maxillary sinus tenderness and frontal sinus tenderness present.  Cardiovascular:     Rate and Rhythm: Normal rate and regular rhythm.     Pulses: Normal pulses.     Heart sounds: Normal heart sounds. No murmur heard.   Pulmonary:     Effort: Pulmonary effort is normal. No respiratory distress.     Breath sounds: Normal breath sounds. No wheezing.  Skin:    General: Skin is warm and dry.     Capillary Refill: Capillary refill takes less than 2 seconds.  Neurological:     General: No focal deficit present.     Mental Status: She is alert and oriented to person, place, and time.     Cranial Nerves: No cranial nerve deficit.     Motor: No weakness.  Psychiatric:        Mood and Affect: Mood normal.        Behavior: Behavior normal.        Thought Content: Thought content normal.        Judgment: Judgment normal.         Assessment And Plan:     1. Sinus pressure  This has been ongoing for approximately 6 weeks had improved then returned, will check a sinus xray pending negative covid test  I will also treat with augmentin  - DG Sinuses Complete; Future  2. Recurrent sinus infections  Will check sinus xray  Encouraged to use nettie pot to flush sinuses  3. Encounter for screening for COVID-19  She is to quarantine until she has negative covid results - Novel Coronavirus, NAA ([facility])     Patient was given opportunity to ask questions. Patient verbalized understanding of the plan and was able to  repeat key elements of the plan. All questions were answered to their satisfaction.  Arnette Felts, FNP   I, Arnette Felts, FNP, have reviewed all documentation for this visit. The documentation on 10/10/20 for the exam, diagnosis, procedures, and orders are all accurate and complete.   THE PATIENT IS ENCOURAGED TO PRACTICE SOCIAL DISTANCING DUE TO THE COVID-19 PANDEMIC.

## 2020-10-10 NOTE — Patient Instructions (Signed)

## 2020-10-12 LAB — NOVEL CORONAVIRUS, NAA: SARS-CoV-2, NAA: NOT DETECTED

## 2020-10-12 LAB — SARS-COV-2, NAA 2 DAY TAT

## 2020-10-15 ENCOUNTER — Telehealth: Payer: Self-pay

## 2020-10-15 NOTE — Telephone Encounter (Signed)
Left the patient a message to call back for lab results. 

## 2020-10-15 NOTE — Telephone Encounter (Signed)
-----   Message from Arnette Felts, FNP sent at 10/12/2020  2:55 PM EST ----- You are negative for covid

## 2020-10-16 ENCOUNTER — Encounter: Payer: Medicare HMO | Admitting: Nurse Practitioner

## 2020-10-18 ENCOUNTER — Ambulatory Visit
Admission: RE | Admit: 2020-10-18 | Discharge: 2020-10-18 | Disposition: A | Discharge status: Home / Self Care | Payer: Medicare HMO | Source: Ambulatory Visit | Attending: Nurse Practitioner | Admitting: Nurse Practitioner

## 2020-10-18 ENCOUNTER — Other Ambulatory Visit: Payer: Self-pay | Admitting: Nurse Practitioner

## 2020-10-18 DIAGNOSIS — J3489 Other specified disorders of nose and nasal sinuses: Secondary | ICD-10-CM

## 2020-10-18 DIAGNOSIS — J320 Chronic maxillary sinusitis: Secondary | ICD-10-CM

## 2020-10-18 MED ORDER — AZELASTINE HCL 0.1 % NA SOLN: 2.0000 | Freq: Two times a day (BID) | NASAL | 2 refills | Status: DC

## 2020-10-18 MED ORDER — AMOXICILLIN-POT CLAVULANATE 875-125 MG PO TABS: 1.0000 | ORAL_TABLET | Freq: Two times a day (BID) | ORAL | 0 refills | Status: AC

## 2020-10-18 NOTE — Progress Notes (Signed)
I will send another round and she needs to use a netti pot, I will also send a nasal spray for her to use

## 2020-10-19 ENCOUNTER — Encounter: Payer: Self-pay | Admitting: Registered Nurse

## 2020-10-19 ENCOUNTER — Other Ambulatory Visit: Payer: Self-pay

## 2020-10-19 ENCOUNTER — Encounter: Payer: Medicare HMO | Attending: Registered Nurse | Admitting: Registered Nurse

## 2020-10-19 VITALS — BP 149/84 | HR 87 | Temp 99.0°F | Ht 67.5 in | Wt 212.0 lb

## 2020-10-19 DIAGNOSIS — M7062 Trochanteric bursitis, left hip: Secondary | ICD-10-CM

## 2020-10-19 DIAGNOSIS — M47816 Spondylosis without myelopathy or radiculopathy, lumbar region: Secondary | ICD-10-CM

## 2020-10-19 DIAGNOSIS — Z5181 Encounter for therapeutic drug level monitoring: Secondary | ICD-10-CM

## 2020-10-19 DIAGNOSIS — G894 Chronic pain syndrome: Secondary | ICD-10-CM

## 2020-10-19 DIAGNOSIS — M7061 Trochanteric bursitis, right hip: Secondary | ICD-10-CM

## 2020-10-19 DIAGNOSIS — Z79891 Long term (current) use of opiate analgesic: Secondary | ICD-10-CM | POA: Diagnosis present

## 2020-10-19 MED ORDER — HYDROCODONE-ACETAMINOPHEN 7.5-325 MG PO TABS: 1.0000 | ORAL_TABLET | Freq: Three times a day (TID) | ORAL | 0 refills | Status: DC | PRN

## 2020-10-19 NOTE — Progress Notes (Signed)
Subjective:     Patient ID: Brittany Crawford, female   DOB: 1955-12-30, 65 y.o.   MRN: [id number]  HPI: Brittany Crawford is a 65 y.o. female who returns for follow up appointment for chronic pain and medication refill. She states her pain is located in her lower back and bilateral hips. Also reports lower back pain has increased in intensity, we will increase her tablets to #90, she verbalizes understanding. She rates her pain 7. Her current exercise regime is walking and performing stretching exercises.  Brittany Crawford Morphine equivalent is 22.01.  Last UDS was Performed on 08/23/2020, it was consistent.   Pain Inventory Average Pain 5 Pain Right Now 7 My pain is sharp and dull  In the last 24 hours, has pain interfered with the following? General activity 8 Relation with others 9 Enjoyment of life 0 What TIME of day is your pain at its worst? morning  and evening Sleep (in general) Fair  Pain is worse with: bending and standing Pain improves with: medication Relief from Meds: 9  walk without assistance walk with assistance use a cane ability to climb steps?  yes do you drive?  no Do you have any goals in this area?  yes  disabled: date disabled .  No problems in this area  Any changes since last visit?  no  Any changes since last visit?  no    Family History  Problem Relation Age of Onset  . Colon cancer Maternal Uncle   . Stroke Mother    Social History   Socioeconomic History  . Marital status: Married    Spouse name: Not on file  . Number of children: 2  . Years of education: 58  . Highest education level: Not on file  Occupational History  . Occupation: disability  Tobacco Use  . Smoking status: Former Smoker    Packs/day: 0.00    Years: 0.00    Pack years: 0.00    Types: Cigarettes    Quit date: 03/13/2017    Years since quitting: 3.6  . Smokeless tobacco: Never Used  . Tobacco comment: 4  to 5  cigarettes daily for years  Vaping Use  . Vaping  Use: Never used  Substance and Sexual Activity  . Alcohol use: Not Currently    Comment: occassionally  . Drug use: Yes    Types: Hydrocodone  . Sexual activity: Yes  Other Topics Concern  . Not on file  Social History Narrative   Patient is married with 2 children.   Patient is right handed.   Patient has hs education.   Patient drinks 4 cups daily.   Social Determinants of Health   Financial Resource Strain: Low Risk   . Difficulty of Paying Living Expenses: Not hard at all  Food Insecurity: No Food Insecurity  . Worried About Charity fundraiser in the Last Year: Never true  . Ran Out of Food in the Last Year: Never true  Transportation Needs: No Transportation Needs  . Lack of Transportation (Medical): No  . Lack of Transportation (Non-Medical): No  Physical Activity: Inactive  . Days of Exercise per Week: 0 days  . Minutes of Exercise per Session: 0 min  Stress: No Stress Concern Present  . Feeling of Stress : Not at all  Social Connections: Not on file   Past Surgical History:  Procedure Laterality Date  . BREAST BIOPSY Left 04/29/2016  . BREAST BIOPSY Left 04/23/2016  . DILATATION & CURRETTAGE/HYSTEROSCOPY WITH  RESECTOCOPE N/A 12/10/2012   Procedure: DILATATION & CURETTAGE/HYSTEROSCOPY WITH RESECTOCOPE;  Surgeon: Marvene Staff, MD;  Location: [facility];  Service: Gynecology;  Laterality: N/A;  . FOOT SURGERY     left-pins placed  . LYMPH NODE BIOPSY    . POLYPECTOMY N/A 12/10/2012   Procedure: POLYPECTOMY;  Surgeon: Marvene Staff, MD;  Location: [facility];  Service: Gynecology;  Laterality: N/A;  . STRABISMUS SURGERY Left 04/17/2017   Procedure: REPAIR STRABISMUS LEFT EYE;  Surgeon: Everitt Amber, MD;  Location: [place];  Service: Ophthalmology;  Laterality: Left;   Past Medical History:  Diagnosis Date  . Chronic back pain   . H/O sarcoidosis   . Hypertension   . Seasonal allergies   . Stroke ([place])    BP (!) 149/84   Pulse 87    Temp 99 F (37.2 C)   Ht 5' 7.5" (1.715 m)   Wt 212 lb (96.2 kg)   LMP 11/13/2012   SpO2 98%   BMI 32.71 kg/m   Opioid Risk Score:   Fall Risk Score:  `1  Depression screen PHQ 2/9  Depression screen [facility] 2/9 08/30/2020 08/23/2020 06/26/2020 06/22/2020 04/30/2020 01/05/2020 07/19/2019  Decreased Interest 0 0 0 - 0 0 0  Down, Depressed, Hopeless 0 0 0 0 0 0 0  PHQ - 2 Score 0 0 0 0 0 0 0  Altered sleeping - - - 0 - - -  Tired, decreased energy - - - 0 - - -  Change in appetite - - - 0 - - -  Feeling bad or failure about yourself  - - - 0 - - -  Trouble concentrating - - - 0 - - -  Moving slowly or fidgety/restless - - - 0 - - -  Suicidal thoughts - - - 0 - - -  PHQ-9 Score - - - 0 - - -  Some recent data might be hidden    Review of Systems  Constitutional: Negative.   HENT: Negative.   Eyes: Negative.   Respiratory: Negative.   Cardiovascular: Negative.   Gastrointestinal: Negative.   Endocrine: Negative.   Genitourinary: Negative.   Musculoskeletal: Positive for back pain.  Skin: Negative.   Allergic/Immunologic: Negative.   Neurological: Negative.   Hematological: Negative.   Psychiatric/Behavioral: Negative.   All other systems reviewed and are negative.      Objective:   Physical Exam Vitals and nursing note reviewed.  Constitutional:      Appearance: Normal appearance.  Cardiovascular:     Rate and Rhythm: Normal rate and regular rhythm.     Pulses: Normal pulses.     Heart sounds: Normal heart sounds.  Pulmonary:     Effort: Pulmonary effort is normal.     Breath sounds: Normal breath sounds.  Musculoskeletal:     Cervical back: Normal range of motion and neck supple.     Comments: Normal Muscle Bulk and Muscle Testing Reveals:  Upper Extremities: Full ROM and Muscle Strength 5/5 Lumbar Paraspinal Tenderness: L-3-L-5 Lower Extremities: Full ROM and Muscle Strength 5/5 Arises from Table with ease Narrow Based  Gait   Skin:    General: Skin is warm  and dry.  Neurological:     Mental Status: She is alert and oriented to person, place, and time.  Psychiatric:        Mood and Affect: Mood normal.        Behavior: Behavior normal.  Assessment: Plan  1. Lumbar spondylosis with DDD and facet arthropathy.Left Lumbar Radiculitis: Continue HEP as Tolerated. Continue to Monitor.10/19/2020 Refilled:Hydrocodone 7.5 /325 mg one tablet every 8 hours as needed for pain. Increased to  #90 tablets. .Second scripte-scribefor the following month. We will continue the opioid monitoring program, this consists of regular clinic visits, examinations, urine drug screen, pill counts as well as use of [place] Controlled Substance Reporting system. A 12 month History has been reviewed on the [place] Controlled Substance Reporting Systemon 10/19/2020. 2. Bilateral knee pain: No complaints voiced today:Continue with heat/ice, exercise and Diclofenac.10/19/2020 3. Right thalamic/internal capsule lacunar infarct with persistent left hemisensory deficits:Neurology Following.Continue to Monitor.10/19/2020. 4.BilateralGreater Trochanteric Bursitis:Continue HEP as Tolerated and Alternate Ice and Heat Therapy.Continue current medication regime.Continue to Monitor.10/19/2020.  F/U in62months

## 2020-11-20 ENCOUNTER — Encounter: Payer: Medicare HMO | Admitting: Nurse Practitioner

## 2020-12-10 ENCOUNTER — Telehealth: Payer: Self-pay | Admitting: *Deleted

## 2020-12-10 NOTE — Telephone Encounter (Signed)
Urine drug screen for this encounter is consistent for prescribed medication 

## 2020-12-11 ENCOUNTER — Other Ambulatory Visit: Payer: Self-pay

## 2020-12-11 ENCOUNTER — Encounter: Payer: Medicare HMO | Attending: Registered Nurse | Admitting: Registered Nurse

## 2020-12-11 VITALS — HR 74 | Temp 99.0°F | Ht 67.5 in | Wt 214.0 lb

## 2020-12-11 DIAGNOSIS — M7062 Trochanteric bursitis, left hip: Secondary | ICD-10-CM | POA: Diagnosis present

## 2020-12-11 DIAGNOSIS — M47816 Spondylosis without myelopathy or radiculopathy, lumbar region: Secondary | ICD-10-CM

## 2020-12-11 DIAGNOSIS — Z5181 Encounter for therapeutic drug level monitoring: Secondary | ICD-10-CM

## 2020-12-11 DIAGNOSIS — M7061 Trochanteric bursitis, right hip: Secondary | ICD-10-CM

## 2020-12-11 DIAGNOSIS — G894 Chronic pain syndrome: Secondary | ICD-10-CM | POA: Diagnosis present

## 2020-12-11 DIAGNOSIS — Z79891 Long term (current) use of opiate analgesic: Secondary | ICD-10-CM

## 2020-12-11 MED ORDER — HYDROCODONE-ACETAMINOPHEN 7.5-325 MG PO TABS: 1.0000 | ORAL_TABLET | Freq: Three times a day (TID) | ORAL | 0 refills | Status: DC | PRN

## 2020-12-11 NOTE — Progress Notes (Signed)
Subjective:    Patient ID: Brittany Crawford, female    DOB: 1956/02/26, 65 y.o.   MRN: [id number]  HPI: Brittany Crawford is a 65 y.o. female who returns for follow up appointment for chronic pain and medication refill. She states her pain is located in her lower back and bilateral hips. She rates her pain 5. Her current exercise regime is walking, walking on treadmill two days a week and performing stretching exercises.  Brittany Crawford equivalent is 22.50 MME.  Last UDS was Performed on 08/23/2020, it was consistent.   VS: BP 146/89 P 74 Sats: 94%  Pain Inventory Average Pain 7 Pain Right Now 5 My pain is sharp and dull  In the last 24 hours, has pain interfered with the following? General activity 8 Relation with others 9 Enjoyment of life 9 What TIME of day is your pain at its worst? morning  and evening Sleep (in general) NA  Pain is worse with: bending and standing Pain improves with: rest, heat/ice, therapy/exercise and medication Relief from Meds: 10  Family History  Problem Relation Age of Onset  . Colon cancer Maternal Uncle   . Stroke Mother    Social History   Socioeconomic History  . Marital status: Married    Spouse name: Not on file  . Number of children: 2  . Years of education: 59  . Highest education level: Not on file  Occupational History  . Occupation: disability  Tobacco Use  . Smoking status: Former Smoker    Packs/day: 0.00    Years: 0.00    Pack years: 0.00    Types: Cigarettes    Quit date: 03/13/2017    Years since quitting: 3.7  . Smokeless tobacco: Never Used  . Tobacco comment: 4  to 5  cigarettes daily for years  Vaping Use  . Vaping Use: Never used  Substance and Sexual Activity  . Alcohol use: Not Currently    Comment: occassionally  . Drug use: Yes    Types: Hydrocodone  . Sexual activity: Yes  Other Topics Concern  . Not on file  Social History Narrative   Patient is married with 2 children.   Patient is right  handed.   Patient has hs education.   Patient drinks 4 cups daily.   Social Determinants of Health   Financial Resource Strain: Low Risk   . Difficulty of Paying Living Expenses: Not hard at all  Food Insecurity: No Food Insecurity  . Worried About Charity fundraiser in the Last Year: Never true  . Ran Out of Food in the Last Year: Never true  Transportation Needs: No Transportation Needs  . Lack of Transportation (Medical): No  . Lack of Transportation (Non-Medical): No  Physical Activity: Inactive  . Days of Exercise per Week: 0 days  . Minutes of Exercise per Session: 0 min  Stress: No Stress Concern Present  . Feeling of Stress : Not at all  Social Connections: Not on file   Past Surgical History:  Procedure Laterality Date  . BREAST BIOPSY Left 04/29/2016  . BREAST BIOPSY Left 04/23/2016  . DILATATION & CURRETTAGE/HYSTEROSCOPY WITH RESECTOCOPE N/A 12/10/2012   Procedure: DILATATION & CURETTAGE/HYSTEROSCOPY WITH RESECTOCOPE;  Surgeon: Marvene Staff, MD;  Location: [facility];  Service: Gynecology;  Laterality: N/A;  . FOOT SURGERY     left-pins placed  . LYMPH NODE BIOPSY    . POLYPECTOMY N/A 12/10/2012   Procedure: POLYPECTOMY;  Surgeon: Marvene Staff,  MD;  Location: [facility];  Service: Gynecology;  Laterality: N/A;  . STRABISMUS SURGERY Left 04/17/2017   Procedure: REPAIR STRABISMUS LEFT EYE;  Surgeon: Everitt Amber, MD;  Location: [facility];  Service: Ophthalmology;  Laterality: Left;   Past Surgical History:  Procedure Laterality Date  . BREAST BIOPSY Left 04/29/2016  . BREAST BIOPSY Left 04/23/2016  . DILATATION & CURRETTAGE/HYSTEROSCOPY WITH RESECTOCOPE N/A 12/10/2012   Procedure: DILATATION & CURETTAGE/HYSTEROSCOPY WITH RESECTOCOPE;  Surgeon: Marvene Staff, MD;  Location: [facility];  Service: Gynecology;  Laterality: N/A;  . FOOT SURGERY     left-pins placed  . LYMPH NODE BIOPSY    . POLYPECTOMY N/A 12/10/2012   Procedure: POLYPECTOMY;   Surgeon: Marvene Staff, MD;  Location: [facility];  Service: Gynecology;  Laterality: N/A;  . STRABISMUS SURGERY Left 04/17/2017   Procedure: REPAIR STRABISMUS LEFT EYE;  Surgeon: Everitt Amber, MD;  Location: [facility];  Service: Ophthalmology;  Laterality: Left;   Past Medical History:  Diagnosis Date  . Chronic back pain   . H/O sarcoidosis   . Hypertension   . Seasonal allergies   . Stroke ([place])    Pulse 74   Temp 99 F (37.2 C)   Ht 5' 7.5" (1.715 m)   Wt 214 lb (97.1 kg)   LMP 11/13/2012   SpO2 94%   BMI 33.02 kg/m   Opioid Risk Score:   Fall Risk Score:  `1  Depression screen PHQ 2/9  Depression screen [facility] 2/9 08/30/2020 08/23/2020 06/26/2020 06/22/2020 04/30/2020 01/05/2020 07/19/2019  Decreased Interest 0 0 0 - 0 0 0  Down, Depressed, Hopeless 0 0 0 0 0 0 0  PHQ - 2 Score 0 0 0 0 0 0 0  Altered sleeping - - - 0 - - -  Tired, decreased energy - - - 0 - - -  Change in appetite - - - 0 - - -  Feeling bad or failure about yourself  - - - 0 - - -  Trouble concentrating - - - 0 - - -  Moving slowly or fidgety/restless - - - 0 - - -  Suicidal thoughts - - - 0 - - -  PHQ-9 Score - - - 0 - - -  Some recent data might be hidden    Review of Systems  Constitutional: Negative.   HENT: Negative.   Eyes: Negative.   Respiratory: Negative.   Cardiovascular: Negative.   Gastrointestinal: Negative.   Endocrine: Negative.   Genitourinary: Negative.   Musculoskeletal: Positive for back pain.  Skin: Negative.   Allergic/Immunologic: Negative.   Neurological: Negative.   Hematological: Negative.   Psychiatric/Behavioral: Negative.   All other systems reviewed and are negative.      Objective:   Physical Exam Vitals and nursing note reviewed.  Constitutional:      Appearance: Normal appearance.  Cardiovascular:     Rate and Rhythm: Normal rate and regular rhythm.     Pulses: Normal pulses.     Heart sounds: Normal heart sounds.  Pulmonary:      Effort: Pulmonary effort is normal.     Breath sounds: Normal breath sounds.  Musculoskeletal:     Cervical back: Normal range of motion and neck supple.     Comments: Normal Muscle Bulk and Muscle Testing Reveals:  Upper Extremities: Full ROM and Muscle Strength 5/5  Lumbar Paraspinal Tenderness: L-3-L-5 Lower Extremities: Full ROM and Muscle Strength 5/5 Arises from Table with ease Narrow  Based  Gait   Skin:    General: Skin is warm and dry.  Neurological:     Mental Status: She is alert and oriented to person, place, and time.  Psychiatric:        Mood and Affect: Mood normal.        Behavior: Behavior normal.           Assessment & Plan:  1. Lumbar spondylosis with DDD and facet arthropathy.Left Lumbar Radiculitis: Continue HEP as Tolerated. Continue to Monitor.12/11/2020 Refilled:Hydrocodone 7.5 /325 mg one tablet every 8 hours as needed for pain. Increased to  #90 tablets. .Second scripte-scribefor the following month. We will continue the opioid monitoring program, this consists of regular clinic visits, examinations, urine drug screen, pill counts as well as use of [place] Controlled Substance Reporting system. A 12 month History has been reviewed on the [place] Controlled Substance Reporting Systemon03/10/2020. 2. Bilateral knee pain: No complaints voiced today:Continue with heat/ice, exercise and Diclofenac.12/11/2020 3. Right thalamic/internal capsule lacunar infarct with persistent left hemisensory deficits:Neurology Following.Continue to Monitor.12/11/2020. 4.BilateralGreater Trochanteric Bursitis:Continue HEP as Tolerated and Alternate Ice and Heat Therapy.Continue current medication regime.Continue to Monitor.12/11/2020.  F/U in78months

## 2020-12-13 ENCOUNTER — Encounter: Payer: Self-pay | Admitting: Registered Nurse

## 2021-01-14 ENCOUNTER — Other Ambulatory Visit: Payer: Self-pay

## 2021-01-14 ENCOUNTER — Ambulatory Visit (INDEPENDENT_AMBULATORY_CARE_PROVIDER_SITE_OTHER): Payer: Medicare HMO | Admitting: Podiatry

## 2021-01-14 DIAGNOSIS — L603 Nail dystrophy: Secondary | ICD-10-CM

## 2021-01-14 DIAGNOSIS — B351 Tinea unguium: Secondary | ICD-10-CM

## 2021-01-18 NOTE — Progress Notes (Signed)
   HPI: 65 y.o. female presenting today for follow-up evaluation of onychomycosis to the bilateral toenails, specifically to the right hallux toenail.  Patient states that she took all that about approximately 2-3 weeks of the oral Lamisil.  She has noticed significant improvement and is the nails are growing out she notices clearing of the nail plates.  No new complaints at this time  Past Medical History:  Diagnosis Date  . Chronic back pain   . H/O sarcoidosis   . Hypertension   . Seasonal allergies   . Stroke [facility])      Physical Exam: General: The patient is alert and oriented x3 in no acute distress.  Dermatology: Skin is warm, dry and supple bilateral lower extremities. Negative for open lesions or macerations.  The base of the nail plate to the right hallux appears to be growing out routinely.  The nail appears to be healthy and viable.  Vascular: Palpable pedal pulses bilaterally. No edema or erythema noted. Capillary refill within normal limits.  Neurological: Epicritic and protective threshold grossly intact bilaterally.   Musculoskeletal Exam: No pedal deformities noted  Assessment: 1.  Onychomycosis of toenail right hallux; improving   Plan of Care:  1. Patient evaluated.  2.  Recommend good foot hygiene as the nail grows out. 3.  Recommend wide fitting shoes that do not constrict her aggravate the toebox area 4.  Return to clinic as needed      Edrick Kins, DPM [facility]  Dr. Edrick Kins, DPM    [address], [place] [address]                Office [phone]  Fax [phone]

## 2021-02-11 NOTE — Progress Notes (Signed)
Subjective:    Patient ID: Brittany Crawford, female    DOB: 1956-07-07, 65 y.o.   MRN: [id number]  HPI: Brittany Crawford is a 65 y.o. female who returns for follow up appointment for chronic pain and medication refill. She states her pain is located in her lower back pain and bilateral hip pain. She rates her pain 7. Her current exercise regime is walking and performing stretching exercises.  [name] Morphine equivalent is 22.50 MME.  UDS ordered today.     Pain Inventory Average Pain 7 Pain Right Now 7 My pain is sharp and dull  In the last 24 hours, has pain interfered with the following? General activity 8 Relation with others 10 Enjoyment of life 9 What TIME of day is your pain at its worst? morning  and evening Sleep (in general) Fair  Pain is worse with: walking, bending and standing Pain improves with: rest, heat/ice, therapy/exercise, pacing activities and medication Relief from Meds: 9  Family History  Problem Relation Age of Onset  . Colon cancer Maternal Uncle   . Stroke Mother    Social History   Socioeconomic History  . Marital status: Married    Spouse name: Not on file  . Number of children: 2  . Years of education: 25  . Highest education level: Not on file  Occupational History  . Occupation: disability  Tobacco Use  . Smoking status: Former Smoker    Packs/day: 0.00    Years: 0.00    Pack years: 0.00    Types: Cigarettes    Quit date: 03/13/2017    Years since quitting: 3.9  . Smokeless tobacco: Never Used  . Tobacco comment: 4  to 5  cigarettes daily for years  Vaping Use  . Vaping Use: Never used  Substance and Sexual Activity  . Alcohol use: Not Currently    Comment: occassionally  . Drug use: Yes    Types: Hydrocodone  . Sexual activity: Yes  Other Topics Concern  . Not on file  Social History Narrative   Patient is married with 2 children.   Patient is right handed.   Patient has hs education.   Patient drinks 4 cups  daily.   Social Determinants of Health   Financial Resource Strain: Low Risk   . Difficulty of Paying Living Expenses: Not hard at all  Food Insecurity: No Food Insecurity  . Worried About Charity fundraiser in the Last Year: Never true  . Ran Out of Food in the Last Year: Never true  Transportation Needs: No Transportation Needs  . Lack of Transportation (Medical): No  . Lack of Transportation (Non-Medical): No  Physical Activity: Inactive  . Days of Exercise per Week: 0 days  . Minutes of Exercise per Session: 0 min  Stress: No Stress Concern Present  . Feeling of Stress : Not at all  Social Connections: Not on file   Past Surgical History:  Procedure Laterality Date  . BREAST BIOPSY Left 04/29/2016  . BREAST BIOPSY Left 04/23/2016  . DILATATION & CURRETTAGE/HYSTEROSCOPY WITH RESECTOCOPE N/A 12/10/2012   Procedure: DILATATION & CURETTAGE/HYSTEROSCOPY WITH RESECTOCOPE;  Surgeon: Marvene Staff, MD;  Location: [facility];  Service: Gynecology;  Laterality: N/A;  . FOOT SURGERY     left-pins placed  . LYMPH NODE BIOPSY    . POLYPECTOMY N/A 12/10/2012   Procedure: POLYPECTOMY;  Surgeon: Marvene Staff, MD;  Location: [facility];  Service: Gynecology;  Laterality: N/A;  . STRABISMUS  SURGERY Left 04/17/2017   Procedure: REPAIR STRABISMUS LEFT EYE;  Surgeon: Everitt Amber, MD;  Location: [facility];  Service: Ophthalmology;  Laterality: Left;   Past Surgical History:  Procedure Laterality Date  . BREAST BIOPSY Left 04/29/2016  . BREAST BIOPSY Left 04/23/2016  . DILATATION & CURRETTAGE/HYSTEROSCOPY WITH RESECTOCOPE N/A 12/10/2012   Procedure: DILATATION & CURETTAGE/HYSTEROSCOPY WITH RESECTOCOPE;  Surgeon: Marvene Staff, MD;  Location: [facility];  Service: Gynecology;  Laterality: N/A;  . FOOT SURGERY     left-pins placed  . LYMPH NODE BIOPSY    . POLYPECTOMY N/A 12/10/2012   Procedure: POLYPECTOMY;  Surgeon: Marvene Staff, MD;  Location: [facility];   Service: Gynecology;  Laterality: N/A;  . STRABISMUS SURGERY Left 04/17/2017   Procedure: REPAIR STRABISMUS LEFT EYE;  Surgeon: Everitt Amber, MD;  Location: [facility];  Service: Ophthalmology;  Laterality: Left;   Past Medical History:  Diagnosis Date  . Chronic back pain   . H/O sarcoidosis   . Hypertension   . Seasonal allergies   . Stroke ([place])    LMP 11/13/2012   Opioid Risk Score:   Fall Risk Score:  `1  Depression screen PHQ 2/9  Depression screen [facility] 2/9 08/30/2020 08/23/2020 06/26/2020 06/22/2020 04/30/2020 01/05/2020 07/19/2019  Decreased Interest 0 0 0 - 0 0 0  Down, Depressed, Hopeless 0 0 0 0 0 0 0  PHQ - 2 Score 0 0 0 0 0 0 0  Altered sleeping - - - 0 - - -  Tired, decreased energy - - - 0 - - -  Change in appetite - - - 0 - - -  Feeling bad or failure about yourself  - - - 0 - - -  Trouble concentrating - - - 0 - - -  Moving slowly or fidgety/restless - - - 0 - - -  Suicidal thoughts - - - 0 - - -  PHQ-9 Score - - - 0 - - -  Some recent data might be hidden   Review of Systems  Constitutional: Negative.   HENT: Negative.   Eyes: Negative.   Respiratory: Negative.   Cardiovascular: Negative.   Gastrointestinal: Negative.   Endocrine: Negative.   Genitourinary: Negative.   Musculoskeletal: Positive for arthralgias and back pain.  Skin: Negative.   Allergic/Immunologic: Negative.   Neurological: Negative.   Hematological: Negative.   Psychiatric/Behavioral: Negative.   All other systems reviewed and are negative.      Objective:   Physical Exam Vitals and nursing note reviewed.  Constitutional:      Appearance: Normal appearance.  Cardiovascular:     Rate and Rhythm: Normal rate and regular rhythm.     Pulses: Normal pulses.     Heart sounds: Normal heart sounds.  Pulmonary:     Effort: Pulmonary effort is normal.     Breath sounds: Normal breath sounds.  Musculoskeletal:     Cervical back: Normal range of motion and neck supple.      Comments: Normal Muscle Bulk and Muscle Testing Reveals:  Upper Extremities: Full ROM and Muscle Strength 5/5 Thoracic Paraspinal Tenderness: T-3-T-7  Lumbar Paraspinal Tenderness: L-3-L-5 Bilateral Greater Trochanter Tenderness Lower Extremities: Full ROM and Muscle Strength 5/5 Arises from Table with ease Narrow Based  Gait   Skin:    General: Skin is warm and dry.  Neurological:     Mental Status: She is alert and oriented to person, place, and time.  Psychiatric:  Mood and Affect: Mood normal.        Behavior: Behavior normal.           Assessment & Plan:  1. Lumbar spondylosis with DDD and facet arthropathy.Left Lumbar Radiculitis: Continue HEP as Tolerated. Continue to Monitor.02/12/2021 Refilled:Hydrocodone 7.5 /325 mg one tablet every 8 hours as needed for pain. Increased to#90 tablets..Second scripte-scribefor the following month. We will continue the opioid monitoring program, this consists of regular clinic visits, examinations, urine drug screen, pill counts as well as use of [place] Controlled Substance Reporting system. A 12 month History has been reviewed on the [place] Controlled Substance Reporting Systemon05/12/2020. 2. Bilateral knee pain: No complaints voiced today:Continue with heat/ice, exercise and Diclofenac.02/12/2021 3. Right thalamic/internal capsule lacunar infarct with persistent left hemisensory deficits:Neurology Following.Continue to Monitor.02/12/2021. 4.BilateralGreater Trochanteric Bursitis:Continue HEP as Tolerated and Alternate Ice and Heat Therapy.Continue current medication regime.Continue to Monitor.02/12/2021.  F/U in72months

## 2021-02-12 ENCOUNTER — Encounter: Payer: Self-pay | Admitting: Registered Nurse

## 2021-02-12 ENCOUNTER — Other Ambulatory Visit: Payer: Self-pay

## 2021-02-12 ENCOUNTER — Encounter: Payer: Medicare HMO | Attending: Registered Nurse | Admitting: Registered Nurse

## 2021-02-12 VITALS — BP 124/84 | HR 77 | Temp 98.8°F | Ht 67.5 in | Wt 216.0 lb

## 2021-02-12 DIAGNOSIS — Z79891 Long term (current) use of opiate analgesic: Secondary | ICD-10-CM | POA: Diagnosis present

## 2021-02-12 DIAGNOSIS — G894 Chronic pain syndrome: Secondary | ICD-10-CM | POA: Insufficient documentation

## 2021-02-12 DIAGNOSIS — M47816 Spondylosis without myelopathy or radiculopathy, lumbar region: Secondary | ICD-10-CM | POA: Diagnosis present

## 2021-02-12 DIAGNOSIS — Z5181 Encounter for therapeutic drug level monitoring: Secondary | ICD-10-CM | POA: Insufficient documentation

## 2021-02-12 DIAGNOSIS — M7062 Trochanteric bursitis, left hip: Secondary | ICD-10-CM | POA: Diagnosis present

## 2021-02-12 DIAGNOSIS — M7061 Trochanteric bursitis, right hip: Secondary | ICD-10-CM | POA: Insufficient documentation

## 2021-02-12 MED ORDER — HYDROCODONE-ACETAMINOPHEN 7.5-325 MG PO TABS: 1.0000 | ORAL_TABLET | Freq: Three times a day (TID) | ORAL | 0 refills | Status: DC | PRN

## 2021-02-21 ENCOUNTER — Telehealth: Payer: Self-pay | Admitting: *Deleted

## 2021-02-21 LAB — TOXASSURE SELECT,+ANTIDEPR,UR

## 2021-02-21 NOTE — Telephone Encounter (Signed)
Urine drug screen for this encounter is consistent for prescribed medication 

## 2021-02-27 ENCOUNTER — Encounter: Payer: Self-pay | Admitting: Nurse Practitioner

## 2021-02-27 ENCOUNTER — Ambulatory Visit (INDEPENDENT_AMBULATORY_CARE_PROVIDER_SITE_OTHER): Payer: Medicare HMO | Admitting: Nurse Practitioner

## 2021-02-27 ENCOUNTER — Other Ambulatory Visit: Payer: Self-pay

## 2021-02-27 VITALS — BP 110/70 | HR 97 | Temp 98.5°F | Ht 68.0 in | Wt 213.0 lb

## 2021-02-27 DIAGNOSIS — Z0000 Encounter for general adult medical examination without abnormal findings: Secondary | ICD-10-CM

## 2021-02-27 DIAGNOSIS — I10 Essential (primary) hypertension: Secondary | ICD-10-CM

## 2021-02-27 DIAGNOSIS — E6609 Other obesity due to excess calories: Secondary | ICD-10-CM

## 2021-02-27 DIAGNOSIS — Z6832 Body mass index (BMI) 32.0-32.9, adult: Secondary | ICD-10-CM

## 2021-02-27 DIAGNOSIS — E782 Mixed hyperlipidemia: Secondary | ICD-10-CM

## 2021-02-27 DIAGNOSIS — R0781 Pleurodynia: Secondary | ICD-10-CM

## 2021-02-27 DIAGNOSIS — Z2821 Immunization not carried out because of patient refusal: Secondary | ICD-10-CM

## 2021-02-27 DIAGNOSIS — Z862 Personal history of diseases of the blood and blood-forming organs and certain disorders involving the immune mechanism: Secondary | ICD-10-CM

## 2021-02-27 LAB — POCT URINALYSIS DIPSTICK
Bilirubin, UA: NEGATIVE
Glucose, UA: NEGATIVE
Ketones, UA: NEGATIVE
Nitrite, UA: NEGATIVE
Protein, UA: NEGATIVE
Spec Grav, UA: 1.02 (ref 1.010–1.025)
Urobilinogen, UA: 0.2 E.U./dL
pH, UA: 7 (ref 5.0–8.0)

## 2021-02-27 LAB — POCT UA - MICROALBUMIN
Albumin/Creatinine Ratio, Urine, POC: <30
Creatinine, POC: 200 mg/dL
Microalbumin Ur, POC: 10 mg/L

## 2021-02-27 MED ORDER — LOSARTAN POTASSIUM-HCTZ 100-12.5 MG PO TABS: 1.0000 | ORAL_TABLET | Freq: Every day | ORAL | 1 refills | Status: DC

## 2021-02-27 NOTE — Progress Notes (Signed)
ef I,Yamilka Roman [employer] as a Education administrator for [employer], FNP.,have documented all relevant documentation on the behalf of [name], FNP,as directed by  [name], FNP while in the presence of [name], [place].  This visit occurred during the SARS-CoV-2 public health emergency.  Safety protocols were in place, including screening questions prior to the visit, additional usage of staff PPE, and extensive cleaning of exam room while observing appropriate contact time as indicated for disinfecting solutions.  Subjective:     Patient ID: Brittany Crawford , female    DOB: 12-15-1955 , 65 y.o.   MRN: [id number]   Chief Complaint  Patient presents with  . Annual Exam    HPI  Patient here for hm. She has seen her pain management provider and the the ophthalmologist.   She reports a history of sarcoidosis and possible scarring to her lung in the 1980's. She reports having been sick in the past initially.   Hypertension This is a chronic problem. The current episode started more than 1 year ago. The problem is unchanged. The problem is controlled. Pertinent negatives include no anxiety, chest pain, headaches, palpitations or shortness of breath. There are no associated agents to hypertension. Risk factors for coronary artery disease include obesity and sedentary lifestyle. Past treatments include angiotensin blockers. There are no compliance problems.  There is no history of angina. There is no history of chronic renal disease.     Past Medical History:  Diagnosis Date  . Chronic back pain   . H/O sarcoidosis   . Hypertension   . Seasonal allergies   . Stroke [facility])      Family History  Problem Relation Age of Onset  . Colon cancer Maternal Uncle   . Stroke Mother      Current Outpatient Medications:  .  Ascorbic Acid (VITAMIN C) 1000 MG tablet, Take 1,000 mg by mouth daily. Takes when coming down with a cold., Disp: , Rfl:  .  azelastine (ASTELIN) 0.1 % nasal spray, Place  2 sprays into both nostrils 2 (two) times daily. Use in each nostril as directed, Disp: 30 mL, Rfl: 2 .  azelastine (OPTIVAR) 0.05 % ophthalmic solution, SMARTSIG:1 Drop(s) In Eye(s) Twice Daily PRN, Disp: , Rfl:  .  diclofenac sodium (VOLTAREN) 1 % GEL, Apply 2 g topically 4 (four) times daily., Disp: 300 g, Rfl: 2 .  gentamicin cream (GARAMYCIN) 0.1 %, Apply 1 application topically 2 (two) times daily., Disp: 15 g, Rfl: 1 .  HYDROcodone-acetaminophen (NORCO) 7.5-325 MG tablet, Take 1 tablet by mouth every 8 (eight) hours as needed for moderate pain., Disp: 90 tablet, Rfl: 0 .  Hypromellose (ARTIFICIAL TEARS OP), Place 1 drop into the right eye daily as needed (for dry eyes)., Disp: , Rfl:  .  Multiple Vitamin (MULTIVITAMIN WITH MINERALS) TABS tablet, Take 1 tablet by mouth daily., Disp: , Rfl:  .  Olopatadine HCl 0.2 % SOLN, Apply 1 drop to eye daily as needed for allergies., Disp: , Rfl: 3 .  prednisoLONE acetate (PRED FORTE) 1 % ophthalmic suspension, Place 1 drop into the left eye as needed (flare ups). , Disp: , Rfl:  .  triamcinolone cream (KENALOG) 0.1 %, Apply 1 application topically at bedtime as needed (for rash)., Disp: 30 g, Rfl: 1 .  conjugated estrogens (PREMARIN) vaginal cream, Place 1 Applicatorful vaginally daily. 0.5 mg per vagina nightly x 2 weeks then 1-2 times per week (Patient not taking: Reported on 02/27/2021), Disp: 42.5 g, Rfl: 3 .  losartan-hydrochlorothiazide (HYZAAR) 100-12.5 MG tablet, Take 1 tablet by mouth daily., Disp: 90 tablet, Rfl: 1   Allergies  Allergen Reactions  . Gadobenate Nausea And Vomiting    Pt was fine after a few minutes , vomiting after contrast injection smills       The patient states she uses post menopausal status for birth control.  Patient's last menstrual period was 11/13/2012.. Negative for Dysmenorrhea and Negative for Menorrhagia. Negative for: breast discharge, breast lump(s), breast pain and breast self exam. Associated symptoms include  abnormal vaginal bleeding. Pertinent negatives include abnormal bleeding (hematology), anxiety, decreased libido, depression, difficulty falling sleep, dyspareunia, history of infertility, nocturia, sexual dysfunction, sleep disturbances, urinary incontinence, urinary urgency, vaginal discharge and vaginal itching. Diet regular - she reports she tries to eat healthy. She does eat candy but not a lot of cakes/desserts. The patient states her exercise level is minimal; she is unable to walk because no one will walk with her and she does not drive.   The patient's tobacco use is:  Social History   Tobacco Use  Smoking Status Former Smoker  . Packs/day: 0.00  . Years: 0.00  . Pack years: 0.00  . Types: Cigarettes  . Quit date: 03/13/2017  . Years since quitting: 3.9  Smokeless Tobacco Never Used  Tobacco Comment   4  to 5  cigarettes daily for years   She has been exposed to passive smoke. The patient's alcohol use is:  Social History   Substance and Sexual Activity  Alcohol Use Not Currently   Comment: occassionally   Additional information: Last pap 2020, next one scheduled for 2023.    Review of Systems  Constitutional: Negative.  Negative for fever.  HENT: Negative.   Eyes: Negative.   Respiratory: Negative.  Negative for shortness of breath and wheezing.   Cardiovascular: Negative.  Negative for chest pain, palpitations and leg swelling.       Pain underneath left rib cage area  Gastrointestinal: Negative.   Endocrine: Negative.  Negative for polydipsia, polyphagia and polyuria.  Genitourinary: Negative.   Musculoskeletal: Negative.   Skin: Negative.   Allergic/Immunologic: Negative.   Neurological: Negative.  Negative for dizziness and headaches.  Hematological: Negative.   Psychiatric/Behavioral: Negative.      Today's Vitals   02/27/21 0944  BP: 110/70  Pulse: 97  Temp: 98.5 F (36.9 C)  Weight: 213 lb (96.6 kg)  Height: '5\' 8"'  (1.727 m)  PainSc: 0-No pain    Body mass index is 32.39 kg/m.   Objective:  Physical Exam Vitals reviewed.  Constitutional:      General: She is not in acute distress.    Appearance: Normal appearance. She is well-developed. She is obese.  HENT:     Head: Normocephalic and atraumatic.     Right Ear: Hearing, tympanic membrane, ear canal and external ear normal. There is no impacted cerumen.     Left Ear: Hearing, tympanic membrane, ear canal and external ear normal. There is no impacted cerumen.     Nose:     Comments: Deferred - masked    Mouth/Throat:     Comments: Deferred - masked Eyes:     General: Lids are normal.     Conjunctiva/sclera: Conjunctivae normal.     Pupils: Pupils are equal, round, and reactive to light.     Funduscopic exam:    Right eye: No papilledema.        Left eye: No papilledema.  Neck:     Thyroid:  No thyroid mass.     Vascular: No carotid bruit.  Cardiovascular:     Rate and Rhythm: Normal rate and regular rhythm.     Pulses: Normal pulses.     Heart sounds: Normal heart sounds. No murmur heard.   Pulmonary:     Effort: Pulmonary effort is normal. No respiratory distress.     Breath sounds: Normal breath sounds. No wheezing.  Abdominal:     General: Bowel sounds are normal.     Palpations: Abdomen is soft.  Genitourinary:    Labia:        Right: No rash or tenderness.        Left: No rash or tenderness.      Vagina: Normal.     Cervix: Normal.     Uterus: Normal.      Adnexa: Right adnexa normal and left adnexa normal.     Rectum: Normal. Guaiac result negative.  Musculoskeletal:        General: No swelling or tenderness. Normal range of motion.     Cervical back: Full passive range of motion without pain, normal range of motion and neck supple.  Skin:    General: Skin is warm and dry.     Capillary Refill: Capillary refill takes less than 2 seconds.     Coloration: Skin is not jaundiced.     Findings: No bruising.  Neurological:     Mental Status: She is  alert and oriented to person, place, and time.     Cranial Nerves: No cranial nerve deficit.     Sensory: No sensory deficit.  Psychiatric:        Behavior: Behavior normal.        Thought Content: Thought content normal.        Judgment: Judgment normal.         Assessment And Plan:     1. Encounter for general adult medical examination w/o abnormal findings . Behavior modifications discussed and diet history reviewed.   . Pt will continue to exercise regularly and modify diet with low GI, plant based foods and decrease intake of processed foods.  . Recommend intake of daily multivitamin, Vitamin D, and calcium.  . Recommend mammogram and colonoscopy for preventive screenings, as well as recommend immunizations that include influenza, TDAP, and Shingles  2. Essential hypertension . B/P is well controlled.  . CMP ordered to check renal function.  . The importance of regular exercise and dietary modification was stressed to the patient.  . Stressed importance of losing ten percent of her body weight to help with B/P control.  . The weight loss would help with decreasing cardiac and cancer risk as well.  . EKG done with NSR HR 68 - POCT Urinalysis Dipstick (81002) - POCT UA - Microalbumin - EKG 12-Lead - CMP14+EGFR - CBC  3. Mixed hyperlipidemia  Chronic, controlled  No current medications - Lipid panel  4. Class 1 obesity due to excess calories without serious comorbidity with body mass index (BMI) of 32.0 to 32.9 in adult  Chronic  Discussed healthy diet and regular exercise options   Encouraged to exercise at least 150 minutes per week with 2 days of strength training as tolerated - Hemoglobin A1c  5. History of sarcoidosis  She has not seen a pulmonologist since she lived in [place] and was diagnosed with Sarcoidosis, she is having pain to her chest area  No abnormal breat sounds noted. - Ambulatory referral to Pulmonology - DG Ribs Unilateral  W/Chest Left;  Future  6. COVID-19 vaccination declined Declines covid 19 vaccine. Discussed risk of covid 6 and if she changes her mind about the vaccine to call the office.  Encouraged to take multivitamin, vitamin d, vitamin c and zinc to increase immune system. Aware can call office if would like to have vaccine here at office.    Reports history of sarcoidosis and told she had scarred lung  Patient was given opportunity to ask questions. Patient verbalized understanding of the plan and was able to repeat key elements of the plan. All questions were answered to their satisfaction.   [name], FNP   I, [name], FNP, have reviewed all documentation for this visit. The documentation on 02/27/21 for the exam, diagnosis, procedures, and orders are all accurate and complete.   THE PATIENT IS ENCOURAGED TO PRACTICE SOCIAL DISTANCING DUE TO THE COVID-19 PANDEMIC.

## 2021-02-27 NOTE — Patient Instructions (Signed)
Health Maintenance, Female Adopting a healthy lifestyle and getting preventive care are important in promoting health and wellness. Ask your health care provider about:  The right schedule for you to have regular tests and exams.  Things you can do on your own to prevent diseases and keep yourself healthy. What should I know about diet, weight, and exercise? Eat a healthy diet  Eat a diet that includes plenty of vegetables, fruits, low-fat dairy products, and lean protein.  Do not eat a lot of foods that are high in solid fats, added sugars, or sodium.   Maintain a healthy weight Body mass index (BMI) is used to identify weight problems. It estimates body fat based on height and weight. Your health care provider can help determine your BMI and help you achieve or maintain a healthy weight. Get regular exercise Get regular exercise. This is one of the most important things you can do for your health. Most adults should:  Exercise for at least 150 minutes each week. The exercise should increase your heart rate and make you sweat (moderate-intensity exercise).  Do strengthening exercises at least twice a week. This is in addition to the moderate-intensity exercise.  Spend less time sitting. Even light physical activity can be beneficial. Watch cholesterol and blood lipids Have your blood tested for lipids and cholesterol at 63 years of age, then have this test every 5 years. Have your cholesterol levels checked more often if:  Your lipid or cholesterol levels are high.  You are older than 63 years of age.  You are at high risk for heart disease. What should I know about cancer screening? Depending on your health history and family history, you may need to have cancer screening at various ages. This may include screening for:  Breast cancer.  Cervical cancer.  Colorectal cancer.  Skin cancer.  Lung cancer. What should I know about heart disease, diabetes, and high blood  pressure? Blood pressure and heart disease  High blood pressure causes heart disease and increases the risk of stroke. This is more likely to develop in people who have high blood pressure readings, are of African descent, or are overweight.  Have your blood pressure checked: ? Every 3-5 years if you are 18-39 years of age. ? Every year if you are 40 years old or older. Diabetes Have regular diabetes screenings. This checks your fasting blood sugar level. Have the screening done:  Once every three years after age 40 if you are at a normal weight and have a low risk for diabetes.  More often and at a younger age if you are overweight or have a high risk for diabetes. What should I know about preventing infection? Hepatitis B If you have a higher risk for hepatitis B, you should be screened for this virus. Talk with your health care provider to find out if you are at risk for hepatitis B infection. Hepatitis C Testing is recommended for:  Everyone born from 1945 through 1965.  Anyone with known risk factors for hepatitis C. Sexually transmitted infections (STIs)  Get screened for STIs, including gonorrhea and chlamydia, if: ? You are sexually active and are younger than 63 years of age. ? You are older than 63 years of age and your health care provider tells you that you are at risk for this type of infection. ? Your sexual activity has changed since you were last screened, and you are at increased risk for chlamydia or gonorrhea. Ask your health care provider   if you are at risk.  Ask your health care provider about whether you are at high risk for HIV. Your health care provider may recommend a prescription medicine to help prevent HIV infection. If you choose to take medicine to prevent HIV, you should first get tested for HIV. You should then be tested every 3 months for as long as you are taking the medicine. Pregnancy  If you are about to stop having your period (premenopausal) and  you may become pregnant, seek counseling before you get pregnant.  Take 400 to 800 micrograms (mcg) of folic acid every day if you become pregnant.  Ask for birth control (contraception) if you want to prevent pregnancy. Osteoporosis and menopause Osteoporosis is a disease in which the bones lose minerals and strength with aging. This can result in bone fractures. If you are 65 years old or older, or if you are at risk for osteoporosis and fractures, ask your health care provider if you should:  Be screened for bone loss.  Take a calcium or vitamin D supplement to lower your risk of fractures.  Be given hormone replacement therapy (HRT) to treat symptoms of menopause. Follow these instructions at home: Lifestyle  Do not use any products that contain nicotine or tobacco, such as cigarettes, e-cigarettes, and chewing tobacco. If you need help quitting, ask your health care provider.  Do not use street drugs.  Do not share needles.  Ask your health care provider for help if you need support or information about quitting drugs. Alcohol use  Do not drink alcohol if: ? Your health care provider tells you not to drink. ? You are pregnant, may be pregnant, or are planning to become pregnant.  If you drink alcohol: ? Limit how much you use to 0-1 drink a day. ? Limit intake if you are breastfeeding.  Be aware of how much alcohol is in your drink. In the [place] one drink equals one 12 oz bottle of beer (355 mL), one 5 oz glass of wine (148 mL), or one 1 oz glass of hard liquor (44 mL). General instructions  Schedule regular health, dental, and eye exams.  Stay current with your vaccines.  Tell your health care provider if: ? You often feel depressed. ? You have ever been abused or do not feel safe at home. Summary  Adopting a healthy lifestyle and getting preventive care are important in promoting health and wellness.  Follow your health care provider's instructions about healthy  diet, exercising, and getting tested or screened for diseases.  Follow your health care provider's instructions on monitoring your cholesterol and blood pressure. This information is not intended to replace advice given to you by your health care provider. Make sure you discuss any questions you have with your health care provider. Document Revised: 09/22/2018 Document Reviewed: 09/22/2018 Elsevier Patient Education  2021 Elsevier Inc.  

## 2021-02-28 ENCOUNTER — Ambulatory Visit
Admission: RE | Admit: 2021-02-28 | Discharge: 2021-02-28 | Disposition: A | Discharge status: Home / Self Care | Payer: Medicare HMO | Source: Ambulatory Visit | Attending: Nurse Practitioner | Admitting: Nurse Practitioner

## 2021-02-28 DIAGNOSIS — Z862 Personal history of diseases of the blood and blood-forming organs and certain disorders involving the immune mechanism: Secondary | ICD-10-CM

## 2021-02-28 LAB — HEMOGLOBIN A1C
Est. average glucose Bld gHb Est-mCnc: 140 mg/dL
Hgb A1c MFr Bld: 6.5 % — ABNORMAL HIGH (ref 4.8–5.6)

## 2021-02-28 LAB — CBC
Hematocrit: 39.1 % (ref 34.0–46.6)
Hemoglobin: 13 g/dL (ref 11.1–15.9)
MCH: 29.1 pg (ref 26.6–33.0)
MCHC: 33.2 g/dL (ref 31.5–35.7)
MCV: 88 fL (ref 79–97)
Platelets: 289 10*3/uL (ref 150–450)
RBC: 4.47 x10E6/uL (ref 3.77–5.28)
RDW: 13.4 % (ref 11.7–15.4)
WBC: 6.1 10*3/uL (ref 3.4–10.8)

## 2021-02-28 LAB — CMP14+EGFR
ALT: 10 IU/L (ref 0–32)
AST: 14 IU/L (ref 0–40)
Albumin/Globulin Ratio: 1.7 (ref 1.2–2.2)
Albumin: 4.7 g/dL (ref 3.8–4.8)
Alkaline Phosphatase: 70 IU/L (ref 44–121)
BUN/Creatinine Ratio: 8 — ABNORMAL LOW (ref 12–28)
BUN: 9 mg/dL (ref 8–27)
Bilirubin Total: 0.7 mg/dL (ref 0.0–1.2)
CO2: 23 mmol/L (ref 20–29)
Calcium: 10.1 mg/dL (ref 8.7–10.3)
Chloride: 99 mmol/L (ref 96–106)
Creatinine, Ser: 1.07 mg/dL — ABNORMAL HIGH (ref 0.57–1.00)
Globulin, Total: 2.8 g/dL (ref 1.5–4.5)
Glucose: 105 mg/dL — ABNORMAL HIGH (ref 65–99)
Potassium: 3.8 mmol/L (ref 3.5–5.2)
Sodium: 138 mmol/L (ref 134–144)
Total Protein: 7.5 g/dL (ref 6.0–8.5)
eGFR: 59 mL/min/{1.73_m2} — ABNORMAL LOW (ref >59)

## 2021-02-28 LAB — LIPID PANEL
Chol/HDL Ratio: 4.5 ratio — ABNORMAL HIGH (ref 0.0–4.4)
Cholesterol, Total: 224 mg/dL — ABNORMAL HIGH (ref 100–199)
HDL: 50 mg/dL (ref >39)
LDL Chol Calc (NIH): 120 mg/dL — ABNORMAL HIGH (ref 0–99)
Triglycerides: 309 mg/dL — ABNORMAL HIGH (ref 0–149)
VLDL Cholesterol Cal: 54 mg/dL — ABNORMAL HIGH (ref 5–40)

## 2021-04-12 ENCOUNTER — Encounter: Payer: Medicare HMO | Attending: Registered Nurse | Admitting: Registered Nurse

## 2021-04-12 ENCOUNTER — Other Ambulatory Visit: Payer: Self-pay

## 2021-04-12 ENCOUNTER — Encounter: Payer: Self-pay | Admitting: Registered Nurse

## 2021-04-12 VITALS — BP 125/81 | HR 85 | Temp 99.0°F | Ht 68.0 in | Wt 214.2 lb

## 2021-04-12 DIAGNOSIS — G894 Chronic pain syndrome: Secondary | ICD-10-CM | POA: Diagnosis present

## 2021-04-12 DIAGNOSIS — Z79891 Long term (current) use of opiate analgesic: Secondary | ICD-10-CM

## 2021-04-12 DIAGNOSIS — M2550 Pain in unspecified joint: Secondary | ICD-10-CM | POA: Diagnosis present

## 2021-04-12 DIAGNOSIS — M47816 Spondylosis without myelopathy or radiculopathy, lumbar region: Secondary | ICD-10-CM

## 2021-04-12 DIAGNOSIS — M7062 Trochanteric bursitis, left hip: Secondary | ICD-10-CM

## 2021-04-12 DIAGNOSIS — M7061 Trochanteric bursitis, right hip: Secondary | ICD-10-CM

## 2021-04-12 DIAGNOSIS — Z5181 Encounter for therapeutic drug level monitoring: Secondary | ICD-10-CM

## 2021-04-12 MED ORDER — HYDROCODONE-ACETAMINOPHEN 7.5-325 MG PO TABS: 1.0000 | ORAL_TABLET | Freq: Three times a day (TID) | ORAL | 0 refills | Status: DC | PRN

## 2021-04-12 NOTE — Progress Notes (Signed)
Subjective:    Patient ID: Brittany Crawford, female    DOB: 05-27-1956, 65 y.o.   MRN: [id number]  HPI: Brittany Crawford is a 65 y.o. female who returns for follow up appointment for chronic pain and medication refill. She states her  pain is located in her lower back, bilateral hips and generalized joint pain. She rates her pain 9. Her current exercise regime is walking, pool therapy and performing stretching exercises.    Ms. Lonsway Morphine equivalent is 22.50 MME.   Last UDS was Performed on 02/12/2021, it was consistent.   Pain Inventory Average Pain 7 Pain Right Now 9 My pain is constant  In the last 24 hours, has pain interfered with the following? General activity 8 Relation with others 10 Enjoyment of life 9 What TIME of day is your pain at its worst? morning  and night Sleep (in general) Good  Pain is worse with: walking, bending, standing, and some activites Pain improves with: rest, heat/ice, therapy/exercise, and medication Relief from Meds: 7  Family History  Problem Relation Age of Onset   Colon cancer Maternal Uncle    Stroke Mother    Social History   Socioeconomic History   Marital status: Married    Spouse name: Not on file   Number of children: 2   Years of education: 12   Highest education level: Not on file  Occupational History   Occupation: disability  Tobacco Use   Smoking status: Former    Packs/day: 0.00    Years: 0.00    Pack years: 0.00    Types: Cigarettes    Quit date: 03/13/2017    Years since quitting: 4.0   Smokeless tobacco: Never   Tobacco comments:    4  to 5  cigarettes daily for years  Vaping Use   Vaping Use: Never used  Substance and Sexual Activity   Alcohol use: Not Currently    Comment: occassionally   Drug use: Yes    Types: Hydrocodone   Sexual activity: Yes  Other Topics Concern   Not on file  Social History Narrative   Patient is married with 2 children.   Patient is right handed.   Patient has hs  education.   Patient drinks 4 cups daily.   Social Determinants of Health   Financial Resource Strain: Low Risk    Difficulty of Paying Living Expenses: Not hard at all  Food Insecurity: No Food Insecurity   Worried About Charity fundraiser in the Last Year: Never true   [place] in the Last Year: Never true  Transportation Needs: No Transportation Needs   Lack of Transportation (Medical): No   Lack of Transportation (Non-Medical): No  Physical Activity: Inactive   Days of Exercise per Week: 0 days   Minutes of Exercise per Session: 0 min  Stress: No Stress Concern Present   Feeling of Stress : Not at all  Social Connections: Not on file   Past Surgical History:  Procedure Laterality Date   BREAST BIOPSY Left 04/29/2016   BREAST BIOPSY Left 04/23/2016   DILATATION & CURRETTAGE/HYSTEROSCOPY WITH RESECTOCOPE N/A 12/10/2012   Procedure: DILATATION & CURETTAGE/HYSTEROSCOPY WITH RESECTOCOPE;  Surgeon: Marvene Staff, MD;  Location: [facility];  Service: Gynecology;  Laterality: N/A;   FOOT SURGERY     left-pins placed   LYMPH NODE BIOPSY     POLYPECTOMY N/A 12/10/2012   Procedure: POLYPECTOMY;  Surgeon: Marvene Staff, MD;  Location: [facility];  Service: Gynecology;  Laterality: N/A;   STRABISMUS SURGERY Left 04/17/2017   Procedure: REPAIR STRABISMUS LEFT EYE;  Surgeon: Everitt Amber, MD;  Location: [facility];  Service: Ophthalmology;  Laterality: Left;   Past Surgical History:  Procedure Laterality Date   BREAST BIOPSY Left 04/29/2016   BREAST BIOPSY Left 04/23/2016   DILATATION & CURRETTAGE/HYSTEROSCOPY WITH RESECTOCOPE N/A 12/10/2012   Procedure: DILATATION & CURETTAGE/HYSTEROSCOPY WITH RESECTOCOPE;  Surgeon: Marvene Staff, MD;  Location: [facility];  Service: Gynecology;  Laterality: N/A;   FOOT SURGERY     left-pins placed   LYMPH NODE BIOPSY     POLYPECTOMY N/A 12/10/2012   Procedure: POLYPECTOMY;  Surgeon: Marvene Staff, MD;  Location:  [facility];  Service: Gynecology;  Laterality: N/A;   STRABISMUS SURGERY Left 04/17/2017   Procedure: REPAIR STRABISMUS LEFT EYE;  Surgeon: Everitt Amber, MD;  Location: [facility];  Service: Ophthalmology;  Laterality: Left;   Past Medical History:  Diagnosis Date   Chronic back pain    H/O sarcoidosis    Hypertension    Seasonal allergies    Stroke (HCC)    BP 125/81 (BP Location: Right Arm, Patient Position: Sitting, Cuff Size: Large)   Pulse 85   Temp 99 F (37.2 C) (Oral)   Ht 5\' 8"  (1.727 m)   Wt 214 lb 3.2 oz (97.2 kg)   LMP 11/13/2012   SpO2 94%   BMI 32.57 kg/m   Opioid Risk Score:   Fall Risk Score:  `1  Depression screen PHQ 2/9  Depression screen [facility] 2/9 08/30/2020 08/23/2020 06/26/2020 06/22/2020 04/30/2020 01/05/2020 07/19/2019  Decreased Interest 0 0 0 - 0 0 0  Down, Depressed, Hopeless 0 0 0 0 0 0 0  PHQ - 2 Score 0 0 0 0 0 0 0  Altered sleeping - - - 0 - - -  Tired, decreased energy - - - 0 - - -  Change in appetite - - - 0 - - -  Feeling bad or failure about yourself  - - - 0 - - -  Trouble concentrating - - - 0 - - -  Moving slowly or fidgety/restless - - - 0 - - -  Suicidal thoughts - - - 0 - - -  PHQ-9 Score - - - 0 - - -  Some recent data might be hidden     Review of Systems  Constitutional: Negative.   HENT: Negative.    Eyes: Negative.   Respiratory: Negative.    Cardiovascular: Negative.   Gastrointestinal: Negative.   Endocrine: Negative.   Genitourinary: Negative.   Musculoskeletal:  Positive for back pain.       Right and left hip pain  Skin: Negative.   Allergic/Immunologic: Negative.   Neurological: Negative.   Hematological: Negative.   Psychiatric/Behavioral: Negative.        Objective:   Physical Exam Vitals and nursing note reviewed.  Constitutional:      Appearance: Normal appearance.  Cardiovascular:     Rate and Rhythm: Normal rate and regular rhythm.     Pulses: Normal pulses.     Heart sounds: Normal  heart sounds.  Pulmonary:     Effort: Pulmonary effort is normal.     Breath sounds: Normal breath sounds.  Musculoskeletal:     Cervical back: Normal range of motion and neck supple.     Comments: Normal Muscle Bulk and Muscle Testing Reveals:  Upper Extremities: Full ROM and Muscle  Strength  5/5  Lumbar Paraspinal Tenderness: L-3-L-5 Bilateral Greater Trochanter Tenderness Lower Extremities: Full ROM and Muscle Strength 5/5 Arises from Table with ease Narrow based  Gait     Skin:    General: Skin is warm and dry.  Neurological:     Mental Status: She is alert and oriented to person, place, and time.  Psychiatric:        Mood and Affect: Mood normal.        Behavior: Behavior normal.         Assessment & Plan:  1. Lumbar spondylosis with DDD and facet arthropathy.Left Lumbar Radiculitis: Continue HEP as Tolerated. Continue to Monitor.  04/12/2021 Refilled: Hydrocodone 7.5 /325 mg one tablet every 8 hours as needed for pain. Increased to  #90 tablets. . Second script e-scribe for the following month. We will continue the opioid monitoring program, this consists of regular clinic visits, examinations, urine drug screen, pill counts as well as use of [place] Controlled Substance Reporting system. A 12 month History has been reviewed on the [place] on 04/12/2021. 2. Bilateral knee pain: No complaints voiced today: Continue with heat/ice, exercise and Diclofenac. 04/12/2021 3. Right thalamic/internal capsule lacunar infarct with persistent left hemisensory deficits: Neurology Following. Continue to Monitor. 04/12/2021. 4.Bilateral Greater Trochanteric Bursitis: Continue HEP as Tolerated and Alternate Ice and Heat Therapy.  Continue current medication regime. Continue to Monitor.  04/12/2021. 5. Polyarthralgia: Continue HEP as Tolerated. Continue to monitor.     F/U in 2 months

## 2021-04-23 ENCOUNTER — Encounter: Payer: Self-pay | Admitting: Emergency Medicine

## 2021-04-23 ENCOUNTER — Ambulatory Visit (INDEPENDENT_AMBULATORY_CARE_PROVIDER_SITE_OTHER): Payer: Medicare HMO | Admitting: Emergency Medicine

## 2021-04-23 ENCOUNTER — Other Ambulatory Visit: Payer: Self-pay

## 2021-04-23 DIAGNOSIS — D869 Sarcoidosis, unspecified: Secondary | ICD-10-CM | POA: Diagnosis not present

## 2021-04-23 NOTE — Addendum Note (Signed)
Addended by: Gavin Potters R on: 04/23/2021 01:54 PM   Modules accepted: Orders

## 2021-04-23 NOTE — Patient Instructions (Signed)
We will work on obtaining your prior pulmonary records from [place]. We will arrange for a CT scan of your chest We will arrange for pulmonary function testing in next office visit Continue your prednisolone eyedrops and follow with ophthalmology as planned. Follow with Dr. Lamonte Sakai next available with full pulmonary function testing on the same day.

## 2021-04-23 NOTE — Progress Notes (Signed)
Subjective:    Patient ID: Brittany Crawford, female    DOB: 04/06/1956, 65 y.o.   MRN: [id number]  HPI 65 year old former smoker (5-10 pack years) been diagnosed with sarcoidosis by mediastinoscopy in her 50's, in [place] state. The eval was started after long stint of cough and dyspnea. She was treated with prednisone at the time of diagnosis (difficulty tolerating due to side effects). She is on prednisolone eye drops for L sided iritis, followed by ophthalmology in [place], ? Related to sarcoid.  Has remotely been on inhaled meds, but never recently. The initial presentation was her only flare, only time she was treated with pred. She does deal with seasonal rhinitis and cough especially in Fall. She can sometimes get a red patchy rash on forehead, wrists. Has used a steroid cream for this w success. Functional capacity is ok - she is limited some by back pain. No real breathing limitations. She will sometimes cough when exposed to fumes.   Chest x-ray of the ribs 02/28/2021 reviewed by me, showed no evidence of fracture, pneumothorax, effusion, there was some linear subsegmental left lower lobe atelectasis, no infiltrates. No CT scan of the chest or pulmonary function testing available currently.   Review of Systems As per HPI  Past Medical History:  Diagnosis Date   Chronic back pain    H/O sarcoidosis    Hypertension    Seasonal allergies    Stroke [facility]      Family History  Problem Relation Age of Onset   Colon cancer Maternal Uncle    Stroke Mother      Social History   Socioeconomic History   Marital status: Married    Spouse name: Not on file   Number of children: 2   Years of education: 12   Highest education level: Not on file  Occupational History   Occupation: disability  Tobacco Use   Smoking status: Former    Packs/day: 0.00    Years: 0.00    Pack years: 0.00    Types: Cigarettes    Quit date: 03/13/2017    Years since quitting: 4.1   Smokeless tobacco:  Never   Tobacco comments:    4  to 5  cigarettes daily for years  Vaping Use   Vaping Use: Never used  Substance and Sexual Activity   Alcohol use: Not Currently    Comment: occassionally   Drug use: Yes    Types: Hydrocodone   Sexual activity: Yes  Other Topics Concern   Not on file  Social History Narrative   Patient is married with 2 children.   Patient is right handed.   Patient has hs education.   Patient drinks 4 cups daily.   Social Determinants of Health   Financial Resource Strain: Low Risk    Difficulty of Paying Living Expenses: Not hard at all  Food Insecurity: No Food Insecurity   Worried About Charity fundraiser in the Last Year: Never true   [place] in the Last Year: Never true  Transportation Needs: No Transportation Needs   Lack of Transportation (Medical): No   Lack of Transportation (Non-Medical): No  Physical Activity: Inactive   Days of Exercise per Week: 0 days   Minutes of Exercise per Session: 0 min  Stress: No Stress Concern Present   Feeling of Stress : Not at all  Social Connections: Not on file  Intimate Partner Violence: Not on file    Raised in [place],  then back to [place] 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   Allergies  Allergen Reactions   Gadobenate Nausea And Vomiting    Pt was fine after a few minutes , vomiting after contrast injection smills      Outpatient Medications Prior to Visit  Medication Sig Dispense Refill   Ascorbic Acid (VITAMIN C) 1000 MG tablet Take 1,000 mg by mouth daily. Takes when coming down with a cold.     azelastine (ASTELIN) 0.1 % nasal spray Place 2 sprays into both nostrils 2 (two) times daily. Use in each nostril as directed 30 mL 2   azelastine (OPTIVAR) 0.05 % ophthalmic solution SMARTSIG:1 Drop(s) In Eye(s) Twice Daily PRN     conjugated estrogens (PREMARIN) vaginal cream Place 1 Applicatorful vaginally daily. 0.5 mg per vagina nightly x 2 weeks then 1-2 times  per week 42.5 g 3   diclofenac sodium (VOLTAREN) 1 % GEL Apply 2 g topically 4 (four) times daily. 300 g 2   gentamicin cream (GARAMYCIN) 0.1 % Apply 1 application topically 2 (two) times daily. 15 g 1   HYDROcodone-acetaminophen (NORCO) 7.5-325 MG tablet Take 1 tablet by mouth every 8 (eight) hours as needed for moderate pain. 90 tablet 0   Hypromellose (ARTIFICIAL TEARS OP) Place 1 drop into the right eye daily as needed (for dry eyes).     losartan-hydrochlorothiazide (HYZAAR) 100-12.5 MG tablet Take 1 tablet by mouth daily. 90 tablet 1   Multiple Vitamin (MULTIVITAMIN WITH MINERALS) TABS tablet Take 1 tablet by mouth daily.     Olopatadine HCl 0.2 % SOLN Apply 1 drop to eye daily as needed for allergies.  3   prednisoLONE acetate (PRED FORTE) 1 % ophthalmic suspension Place 1 drop into the left eye as needed (flare ups).      triamcinolone cream (KENALOG) 0.1 % Apply 1 application topically at bedtime as needed (for rash). 30 g 1   No facility-administered medications prior to visit.         Objective:   Physical Exam Vitals:   04/23/21 1144  BP: 118/80  Pulse: 82  Temp: 98.7 F (37.1 C)  TempSrc: Oral  SpO2: 98%  Weight: 211 lb 3.2 oz (95.8 kg)  Height: 5' 7.5" (1.715 m)   Gen: Pleasant, well-nourished, in no distress,  normal affect  ENT: No lesions,  mouth clear,  oropharynx clear, no postnasal drip  Neck: No JVD, no stridor  Lungs: No use of accessory muscles, no crackles or wheezing on normal respiration, no wheeze on forced expiration  Cardiovascular: RRR, heart sounds normal, no murmur or gallops, no peripheral edema  Musculoskeletal: No deformities, no cyanosis or clubbing  Neuro: alert, awake, non focal  Skin: Warm, no lesions or rash      Assessment & Plan:  Sarcoidosis With history of remote active pulmonary disease at the time of original diagnosis.  No flares since, no chronic or acute prednisone or steroid sparing agents per her history.  I will  work on getting her studies from [place] including imaging, office notes, PFT.  She has a history of intermittent rash, also iritis.  Both could possibly relate to sarcoid, and appear to respond to local steroid treatment. Need to establish her current baseline with regard to pulmonary disease.  She needs baseline PFT and CT chest to look for interstitial change.  We will arrange for these and then follow-up with her.  We will work on obtaining your prior  pulmonary records from [place]. We will arrange for a CT scan of your chest We will arrange for pulmonary function testing in next office visit Continue your prednisolone eyedrops and follow with ophthalmology as planned. Follow with Dr. Lamonte Sakai next available with full pulmonary function testing on the same day.     Baltazar Apo, MD, PhD 04/23/2021, 1:32 PM [facility] [phone] or if no answer before 7:00PM call [phone] For any issues after 7:00PM please call eLink [phone]

## 2021-04-23 NOTE — Assessment & Plan Note (Signed)
With history of remote active pulmonary disease at the time of original diagnosis.  No flares since, no chronic or acute prednisone or steroid sparing agents per her history.  I will work on getting her studies from [place] including imaging, office notes, PFT.  She has a history of intermittent rash, also iritis.  Both could possibly relate to sarcoid, and appear to respond to local steroid treatment. Need to establish her current baseline with regard to pulmonary disease.  She needs baseline PFT and CT chest to look for interstitial change.  We will arrange for these and then follow-up with her.  We will work on obtaining your prior pulmonary records from [place]. We will arrange for a CT scan of your chest We will arrange for pulmonary function testing in next office visit Continue your prednisolone eyedrops and follow with ophthalmology as planned. Follow with Dr. Lamonte Sakai next available with full pulmonary function testing on the same day.

## 2021-04-30 ENCOUNTER — Other Ambulatory Visit: Payer: Medicare HMO

## 2021-05-04 ENCOUNTER — Ambulatory Visit
Admission: RE | Admit: 2021-05-04 | Discharge: 2021-05-04 | Disposition: A | Discharge status: Home / Self Care | Payer: Medicare HMO | Source: Ambulatory Visit | Attending: Emergency Medicine | Admitting: Emergency Medicine

## 2021-05-04 ENCOUNTER — Other Ambulatory Visit: Payer: Self-pay

## 2021-05-04 DIAGNOSIS — D869 Sarcoidosis, unspecified: Secondary | ICD-10-CM

## 2021-05-17 ENCOUNTER — Other Ambulatory Visit: Payer: Self-pay

## 2021-05-17 ENCOUNTER — Ambulatory Visit (INDEPENDENT_AMBULATORY_CARE_PROVIDER_SITE_OTHER): Payer: Medicare HMO | Admitting: Emergency Medicine

## 2021-05-17 ENCOUNTER — Encounter: Payer: Self-pay | Admitting: Emergency Medicine

## 2021-05-17 DIAGNOSIS — D869 Sarcoidosis, unspecified: Secondary | ICD-10-CM

## 2021-05-17 LAB — PULMONARY FUNCTION TEST
DL/VA % pred: 104 %
DL/VA: 4.29 ml/min/mmHg/L
DLCO cor % pred: 51 %
DLCO cor: 11.66 ml/min/mmHg
DLCO unc % pred: 51 %
DLCO unc: 11.66 ml/min/mmHg
FEF 25-75 Post: 1.23 L/sec
FEF 25-75 Pre: 2.19 L/sec
FEF2575-%Change-Post: -43 %
FEF2575-%Pred-Post: 54 %
FEF2575-%Pred-Pre: 97 %
FEV1-%Change-Post: -13 %
FEV1-%Pred-Post: 79 %
FEV1-%Pred-Pre: 91 %
FEV1-Post: 1.87 L
FEV1-Pre: 2.16 L
FEV1FVC-%Change-Post: -1 %
FEV1FVC-%Pred-Pre: 102 %
FEV6-%Change-Post: -10 %
FEV6-%Pred-Post: 80 %
FEV6-%Pred-Pre: 89 %
FEV6-Post: 2.34 L
FEV6-Pre: 2.62 L
FEV6FVC-%Change-Post: 1 %
FEV6FVC-%Pred-Post: 103 %
FEV6FVC-%Pred-Pre: 101 %
FVC-%Change-Post: -12 %
FVC-%Pred-Post: 77 %
FVC-%Pred-Pre: 88 %
FVC-Post: 2.34 L
FVC-Pre: 2.66 L
Post FEV1/FVC ratio: 80 %
Post FEV6/FVC ratio: 100 %
Pre FEV1/FVC ratio: 81 %
Pre FEV6/FVC Ratio: 98 %
RV % pred: 46 %
RV: 1.02 L
TLC % pred: 95 %
TLC: 5.3 L

## 2021-05-17 NOTE — Assessment & Plan Note (Signed)
Clinically stable.  No rash, no wheezing or cough unless she has exposure to fumes or chemicals.  Pulmonary function testing with normal airflows, some mild restriction and diffusion capacity that corrects to normal range.  Her CT scan of the chest is also reassuring without any lymphadenopathy or significant infiltrate, scar associated with sarcoidosis.  I reassured her about both of these findings.  She knows to contact me if she develops any respiratory symptoms, rash.  She follows with ophthalmology and is on prednisolone eyedrops.  We reviewed your pulmonary function testing today.  Your airflows are normal which is good news.  There was a mild restriction on the size of each breath, possibly related to some weight gain.  No abnormalities consistent with sarcoidosis. Your CT scan of the chest was reviewed.  No enlarged lymph nodes or changes consistent with sarcoidosis.  Again good news. Continue your prednisolone eyedrops as you have been taking them and follow-up with ophthalmology. Follow Dr. Lamonte Sakai in 1 year or sooner if you have any changes or problems with breathing.

## 2021-05-17 NOTE — Progress Notes (Signed)
Subjective:    Patient ID: Brittany Crawford, female    DOB: March 29, 1956, 65 y.o.   MRN: [id number]  HPI 65 year old former smoker (5-10 pack years) been diagnosed with sarcoidosis by mediastinoscopy in her 31's, in [place] state. The eval was started after long stint of cough and dyspnea. She was treated with prednisone at the time of diagnosis (difficulty tolerating due to side effects). She is on prednisolone eye drops for L sided iritis, followed by ophthalmology in [place], ? Related to sarcoid.  Has remotely been on inhaled meds, but never recently. The initial presentation was her only flare, only time she was treated with pred. She does deal with seasonal rhinitis and cough especially in Fall. She can sometimes get a red patchy rash on forehead, wrists. Has used a steroid cream for this w success. Functional capacity is ok - she is limited some by back pain. No real breathing limitations. She will sometimes cough when exposed to fumes.   Chest x-ray of the ribs 02/28/2021 reviewed by me, showed no evidence of fracture, pneumothorax, effusion, there was some linear subsegmental left lower lobe atelectasis, no infiltrates. No CT scan of the chest or pulmonary function testing available currently.   ROV 05/17/21 --follow-up visit for 65 year old woman with sarcoidosis with ocular effects on prednisolone eyedrops for iritis.  Also with a history of intermittent rash.  She has not had any respiratory flares for many years.   Pulmonary function testing performed today and reviewed by me, shows normal airflows without a bronchodilator response.  Some irregularity of the inspiratory curve of the flow-volume loop consistent with possible intermittent upper airway obstruction or vocal cord irritability.  Her lung volumes suggest possible restriction based on the decreased RV.  Her diffusion capacity is decreased but corrects to the normal range when adjusted for alveolar volume. She is feeling good. Not  having any cough or wheeze - she is sensitive to chemicals and fumes, can bring on cough. She does not have any breathing limitations at this time.   CT scan of the chest performed 05/04/2021 reviewed by me, shows no lymphadenopathy, some mild centrilobular emphysematous change and some linear basilar atelectasis but no infiltrates, no nodular changes or sequela of sarcoidosis.   Review of Systems As per HPI      Objective:   Physical Exam Vitals:   05/17/21 1627  BP: 126/64  Pulse: 85  Temp: 98.3 F (36.8 C)  TempSrc: Oral  SpO2: 98%  Weight: 213 lb 12.8 oz (97 kg)  Height: 5' 7.5" (1.715 m)    Gen: Pleasant, well-nourished, in no distress,  normal affect  ENT: No lesions,  mouth clear,  oropharynx clear, no postnasal drip  Neck: No JVD, no stridor  Lungs: No use of accessory muscles, no crackles or wheezing on normal respiration, no wheeze on forced expiration  Cardiovascular: RRR, heart sounds normal, no murmur or gallops, no peripheral edema  Musculoskeletal: No deformities, no cyanosis or clubbing  Neuro: alert, awake, non focal  Skin: Warm, no lesions or rash      Assessment & Plan:  Sarcoidosis Clinically stable.  No rash, no wheezing or cough unless she has exposure to fumes or chemicals.  Pulmonary function testing with normal airflows, some mild restriction and diffusion capacity that corrects to normal range.  Her CT scan of the chest is also reassuring without any lymphadenopathy or significant infiltrate, scar associated with sarcoidosis.  I reassured her about both of these findings.  She knows  to contact me if she develops any respiratory symptoms, rash.  She follows with ophthalmology and is on prednisolone eyedrops.  We reviewed your pulmonary function testing today.  Your airflows are normal which is good news.  There was a mild restriction on the size of each breath, possibly related to some weight gain.  No abnormalities consistent with  sarcoidosis. Your CT scan of the chest was reviewed.  No enlarged lymph nodes or changes consistent with sarcoidosis.  Again good news. Continue your prednisolone eyedrops as you have been taking them and follow-up with ophthalmology. Follow Dr. Lamonte Sakai in 1 year or sooner if you have any changes or problems with breathing.    Baltazar Apo, MD, PhD 05/17/2021, 4:47 PM [facility] [phone] or if no answer before 7:00PM call [phone] For any issues after 7:00PM please call eLink [phone]

## 2021-05-17 NOTE — Patient Instructions (Signed)
We reviewed your pulmonary function testing today.  Your airflows are normal which is good news.  There was a mild restriction on the size of each breath, possibly related to some weight gain.  No abnormalities consistent with sarcoidosis. Your CT scan of the chest was reviewed.  No enlarged lymph nodes or changes consistent with sarcoidosis.  Again good news. Continue your prednisolone eyedrops as you have been taking them and follow-up with ophthalmology. Follow Dr. Lamonte Sakai in 1 year or sooner if you have any changes or problems with breathing.

## 2021-05-17 NOTE — Progress Notes (Signed)
Full PFT completed today ? ?

## 2021-06-13 ENCOUNTER — Encounter: Payer: Medicare HMO | Attending: Registered Nurse | Admitting: Registered Nurse

## 2021-06-13 ENCOUNTER — Other Ambulatory Visit: Payer: Self-pay

## 2021-06-13 VITALS — BP 123/77 | HR 80 | Temp 98.8°F | Ht 67.5 in | Wt 214.8 lb

## 2021-06-13 DIAGNOSIS — M25562 Pain in left knee: Secondary | ICD-10-CM | POA: Insufficient documentation

## 2021-06-13 DIAGNOSIS — M25561 Pain in right knee: Secondary | ICD-10-CM | POA: Insufficient documentation

## 2021-06-13 DIAGNOSIS — Z5181 Encounter for therapeutic drug level monitoring: Secondary | ICD-10-CM

## 2021-06-13 DIAGNOSIS — M7061 Trochanteric bursitis, right hip: Secondary | ICD-10-CM

## 2021-06-13 DIAGNOSIS — Z79891 Long term (current) use of opiate analgesic: Secondary | ICD-10-CM

## 2021-06-13 DIAGNOSIS — G894 Chronic pain syndrome: Secondary | ICD-10-CM

## 2021-06-13 DIAGNOSIS — M2550 Pain in unspecified joint: Secondary | ICD-10-CM | POA: Diagnosis present

## 2021-06-13 DIAGNOSIS — M7062 Trochanteric bursitis, left hip: Secondary | ICD-10-CM

## 2021-06-13 DIAGNOSIS — M5416 Radiculopathy, lumbar region: Secondary | ICD-10-CM | POA: Diagnosis present

## 2021-06-13 DIAGNOSIS — M47816 Spondylosis without myelopathy or radiculopathy, lumbar region: Secondary | ICD-10-CM

## 2021-06-13 DIAGNOSIS — G8929 Other chronic pain: Secondary | ICD-10-CM

## 2021-06-13 MED ORDER — HYDROCODONE-ACETAMINOPHEN 7.5-325 MG PO TABS: 1.0000 | ORAL_TABLET | Freq: Three times a day (TID) | ORAL | 0 refills | Status: DC | PRN

## 2021-06-13 NOTE — Progress Notes (Signed)
Subjective:    Patient ID: Brittany Crawford, female    DOB: 12/07/1955, 65 y.o.   MRN: [id number]  HPI: Brittany Crawford is a 65 y.o. female who returns for follow up appointment for chronic pain and medication refill.  She states her pain is located in her  lower back pain radiating into her bilateral hips and bilateral knee pain. He rates his pain 6. His current exercise regime is walking and performing stretching exercises.  Ms. Merchen Morphine equivalent is 22.50 MME.   Last UDS was Performed on 02/12/2021, it was consistent.     Pain Inventory Average Pain 8 Pain Right Now 6 My pain is dull and aching  In the last 24 hours, has pain interfered with the following? General activity 8 Relation with others 10 Enjoyment of life 9 What TIME of day is your pain at its worst? evening Sleep (in general) Fair  Pain is worse with: bending and standing Pain improves with: rest, heat/ice, therapy/exercise, and medication Relief from Meds: 9  Family History  Problem Relation Age of Onset   Colon cancer Maternal Uncle    Stroke Mother    Social History   Socioeconomic History   Marital status: Married    Spouse name: Not on file   Number of children: 2   Years of education: 12   Highest education level: Not on file  Occupational History   Occupation: disability  Tobacco Use   Smoking status: Former    Packs/day: 0.00    Years: 0.00    Pack years: 0.00    Types: Cigarettes    Quit date: 03/13/2017    Years since quitting: 4.2   Smokeless tobacco: Never   Tobacco comments:    4  to 5  cigarettes daily for years  Vaping Use   Vaping Use: Never used  Substance and Sexual Activity   Alcohol use: Not Currently    Comment: occassionally   Drug use: Yes    Types: Hydrocodone   Sexual activity: Yes  Other Topics Concern   Not on file  Social History Narrative   Patient is married with 2 children.   Patient is right handed.   Patient has hs education.   Patient drinks  4 cups daily.   Social Determinants of Health   Financial Resource Strain: Low Risk    Difficulty of Paying Living Expenses: Not hard at all  Food Insecurity: No Food Insecurity   Worried About Charity fundraiser in the Last Year: Never true   [place] in the Last Year: Never true  Transportation Needs: No Transportation Needs   Lack of Transportation (Medical): No   Lack of Transportation (Non-Medical): No  Physical Activity: Inactive   Days of Exercise per Week: 0 days   Minutes of Exercise per Session: 0 min  Stress: No Stress Concern Present   Feeling of Stress : Not at all  Social Connections: Not on file   Past Surgical History:  Procedure Laterality Date   BREAST BIOPSY Left 04/29/2016   BREAST BIOPSY Left 04/23/2016   DILATATION & CURRETTAGE/HYSTEROSCOPY WITH RESECTOCOPE N/A 12/10/2012   Procedure: DILATATION & CURETTAGE/HYSTEROSCOPY WITH RESECTOCOPE;  Surgeon: Marvene Staff, MD;  Location: [facility];  Service: Gynecology;  Laterality: N/A;   FOOT SURGERY     left-pins placed   LYMPH NODE BIOPSY     POLYPECTOMY N/A 12/10/2012   Procedure: POLYPECTOMY;  Surgeon: Marvene Staff, MD;  Location: [facility];  Service: Gynecology;  Laterality: N/A;   STRABISMUS SURGERY Left 04/17/2017   Procedure: REPAIR STRABISMUS LEFT EYE;  Surgeon: Everitt Amber, MD;  Location: [facility];  Service: Ophthalmology;  Laterality: Left;   Past Surgical History:  Procedure Laterality Date   BREAST BIOPSY Left 04/29/2016   BREAST BIOPSY Left 04/23/2016   DILATATION & CURRETTAGE/HYSTEROSCOPY WITH RESECTOCOPE N/A 12/10/2012   Procedure: DILATATION & CURETTAGE/HYSTEROSCOPY WITH RESECTOCOPE;  Surgeon: Marvene Staff, MD;  Location: [facility];  Service: Gynecology;  Laterality: N/A;   FOOT SURGERY     left-pins placed   LYMPH NODE BIOPSY     POLYPECTOMY N/A 12/10/2012   Procedure: POLYPECTOMY;  Surgeon: Marvene Staff, MD;  Location: [facility];  Service: Gynecology;   Laterality: N/A;   STRABISMUS SURGERY Left 04/17/2017   Procedure: REPAIR STRABISMUS LEFT EYE;  Surgeon: Everitt Amber, MD;  Location: [facility];  Service: Ophthalmology;  Laterality: Left;   Past Medical History:  Diagnosis Date   Chronic back pain    H/O sarcoidosis    Hypertension    Seasonal allergies    Stroke (HCC)    BP 123/77 (BP Location: Left Arm, Patient Position: Sitting, Cuff Size: Large)   Pulse 80   Temp 98.8 F (37.1 C) (Oral)   Ht 5' 7.5" (1.715 m)   Wt 214 lb 12.8 oz (97.4 kg)   LMP 11/13/2012   SpO2 96%   BMI 33.15 kg/m   Opioid Risk Score:   Fall Risk Score:  `1  Depression screen PHQ 2/9  Depression screen [facility] 2/9 04/12/2021 08/30/2020 08/23/2020 06/26/2020 06/22/2020 04/30/2020 01/05/2020  Decreased Interest 0 0 0 0 - 0 0  Down, Depressed, Hopeless 0 0 0 0 0 0 0  PHQ - 2 Score 0 0 0 0 0 0 0  Altered sleeping - - - - 0 - -  Tired, decreased energy - - - - 0 - -  Change in appetite - - - - 0 - -  Feeling bad or failure about yourself  - - - - 0 - -  Trouble concentrating - - - - 0 - -  Moving slowly or fidgety/restless - - - - 0 - -  Suicidal thoughts - - - - 0 - -  PHQ-9 Score - - - - 0 - -  Some recent data might be hidden     Review of Systems  Musculoskeletal:  Positive for back pain.       Hip pain  All other systems reviewed and are negative.     Objective:   Physical Exam Vitals and nursing note reviewed.  Constitutional:      Appearance: Normal appearance.  Cardiovascular:     Rate and Rhythm: Normal rate and regular rhythm.     Pulses: Normal pulses.     Heart sounds: Normal heart sounds.  Pulmonary:     Effort: Pulmonary effort is normal.     Breath sounds: Normal breath sounds.  Musculoskeletal:     Cervical back: Normal range of motion and neck supple.     Comments: Normal Muscle Bulk and Muscle Testing Reveals:  Upper Extremities:Full  ROM and Muscle Strength 5/5 Bilateral AC Joint Tenderness Lumbar Paraspinal  Tenderness: L-3-L-5 Lower Extremities: Full ROM and Muscle Strength 5/5 Arises from chair with ease Narrow Based Gait     Skin:    General: Skin is warm and dry.  Neurological:     Mental Status: She is alert and oriented  to person, place, and time.  Psychiatric:        Mood and Affect: Mood normal.        Behavior: Behavior normal.         Assessment & Plan:  1. Lumbar spondylosis with DDD and facet arthropathy.Left Lumbar Radiculitis: Continue HEP as Tolerated. Continue to Monitor.  06/13/2021 Refilled: Hydrocodone 7.5 /325 mg one tablet every 8 hours as needed for pain. Increased to  #90 tablets. . Second script e-scribe for the following month. We will continue the opioid monitoring program, this consists of regular clinic visits, examinations, urine drug screen, pill counts as well as use of [place] Controlled Substance Reporting system. A 12 month History has been reviewed on the [place] on 06/13/2021. 2. Bilateral knee pain: No complaints voiced today: Continue with heat/ice, exercise and Diclofenac. 06/13/2021 3. Right thalamic/internal capsule lacunar infarct with persistent left hemisensory deficits: Neurology Following. Continue to Monitor. 06/13/2021. 4.Bilateral Greater Trochanteric Bursitis: Continue HEP as Tolerated and Alternate Ice and Heat Therapy.  Continue current medication regime. Continue to Monitor.  06/13/2021. 5. Polyarthralgia: Continue HEP as Tolerated. Continue to monitor. 06/13/2021    F/U in 2 months

## 2021-06-15 ENCOUNTER — Telehealth: Payer: Self-pay | Admitting: Internal Medicine

## 2021-06-15 DIAGNOSIS — M47816 Spondylosis without myelopathy or radiculopathy, lumbar region: Secondary | ICD-10-CM

## 2021-06-15 MED ORDER — HYDROCODONE-ACETAMINOPHEN 7.5-325 MG PO TABS: 1.0000 | ORAL_TABLET | Freq: Three times a day (TID) | ORAL | 0 refills | Status: DC | PRN

## 2021-06-15 NOTE — Telephone Encounter (Signed)
Pt called on call provider with concern regarding pain meds. She usually uses [facility] on [address]. States most recent visit was earlier in week. Unfortunately, pharmacy has lost DEA and cannot order/dispense  narcotics. Pt runs out of meds today. She is anxious b/c she is not sure how she will get through the weekend. I spoke with pharmacist who confirmed everything as above.   Pt advised that I did not want her to compromise her pain contract, but this is obviously something out of her control. I will send hydrocodone 7.'5mg'$  q8h prn #10 to [facility] as requested. Home pharmacist states she can remove 10 pills when she is able to fill original prescription. Pharmacist agreed with plan as stated above. Pt agrees to contact pain mgmt clinic on Tuesday to explain current situation so they can send new rx to different pharmacy as needed.   All questions were answered to the patient's satisfaction.   RS

## 2021-06-17 ENCOUNTER — Encounter: Payer: Self-pay | Admitting: Registered Nurse

## 2021-06-18 ENCOUNTER — Telehealth: Payer: Self-pay

## 2021-06-18 DIAGNOSIS — M47816 Spondylosis without myelopathy or radiculopathy, lumbar region: Secondary | ICD-10-CM

## 2021-06-18 NOTE — Telephone Encounter (Signed)
Wants to speak to someone about her medication that she could not get over the weekend and had to get her PCP to prescribe #10.

## 2021-06-18 NOTE — Telephone Encounter (Signed)
Prescriptions has been cancelled at [facility] and resend to [facility]

## 2021-06-19 MED ORDER — HYDROCODONE-ACETAMINOPHEN 7.5-325 MG PO TABS: 1.0000 | ORAL_TABLET | Freq: Three times a day (TID) | ORAL | 0 refills | Status: DC | PRN

## 2021-06-19 NOTE — Telephone Encounter (Signed)
PMP was Reviewed.  Hydrocodne e-scribed today.  Placed a call to Ms. Borjon regarding the above, she verbalizes understanding.

## 2021-07-01 NOTE — Telephone Encounter (Signed)
error 

## 2021-07-19 ENCOUNTER — Telehealth: Payer: Self-pay

## 2021-07-19 DIAGNOSIS — M47816 Spondylosis without myelopathy or radiculopathy, lumbar region: Secondary | ICD-10-CM

## 2021-07-19 MED ORDER — HYDROCODONE-ACETAMINOPHEN 7.5-325 MG PO TABS: 1.0000 | ORAL_TABLET | Freq: Three times a day (TID) | ORAL | 0 refills | Status: DC | PRN

## 2021-07-19 NOTE — Telephone Encounter (Signed)
Mrs. Speece was only given a 7 day supply of Rx  hydrocodone 7.5-325 MG. Because she changed insurance plans. She was advised to call back for a refill before her current supply runs out.

## 2021-07-19 NOTE — Telephone Encounter (Signed)
Patient called stating appt is not until next month and will be out of meds after today. Next appt 08/13/21, last appt 06/13/21, last fill Hydrocodone/APAP 7.5/325 mg #90 on 06/19/21. [facility]

## 2021-07-19 NOTE — Telephone Encounter (Signed)
PMP was Reviewed.  Hydrocodone e-scribed today.  Placed a call to Ms. Mcquown, she is aware of the above and verbalizes understanding.

## 2021-07-24 ENCOUNTER — Telehealth: Payer: Self-pay | Admitting: Registered Nurse

## 2021-07-24 ENCOUNTER — Other Ambulatory Visit: Payer: Self-pay

## 2021-07-24 ENCOUNTER — Ambulatory Visit (INDEPENDENT_AMBULATORY_CARE_PROVIDER_SITE_OTHER): Payer: Medicare Other

## 2021-07-24 VITALS — BP 128/70 | HR 96 | Temp 98.7°F | Ht 68.0 in | Wt 211.0 lb

## 2021-07-24 DIAGNOSIS — Z23 Encounter for immunization: Secondary | ICD-10-CM

## 2021-07-24 DIAGNOSIS — Z0000 Encounter for general adult medical examination without abnormal findings: Secondary | ICD-10-CM | POA: Diagnosis not present

## 2021-07-24 DIAGNOSIS — M47816 Spondylosis without myelopathy or radiculopathy, lumbar region: Secondary | ICD-10-CM

## 2021-07-24 MED ORDER — HYDROCODONE-ACETAMINOPHEN 7.5-325 MG PO TABS: 1.0000 | ORAL_TABLET | Freq: Three times a day (TID) | ORAL | 0 refills | Status: DC | PRN

## 2021-07-24 NOTE — Telephone Encounter (Signed)
PMP was Reviewed:  Ms. Hird had changed insurance companies, she was only dispense a 7 day prescription.  Hydrocodone e-scribed today, Ms. Adger is aware of the above.

## 2021-07-24 NOTE — Patient Instructions (Signed)
Brittany Crawford , Thank you for taking time to come for your Medicare Wellness Visit. I appreciate your ongoing commitment to your health goals. Please review the following plan we discussed and let me know if I can assist you in the future.   Screening recommendations/referrals: Colonoscopy: completed 07/05/2012 Mammogram: completed 07/23/2020 Bone Density: n/a Recommended yearly ophthalmology/optometry visit for glaucoma screening and checkup Recommended yearly dental visit for hygiene and checkup  Vaccinations: Influenza vaccine: today Pneumococcal vaccine: completed 07/08/2014 Tdap vaccine: decline Shingles vaccine: only had one dose  Covid-19:  01/30/2020, 12/30/2019  Advanced directives: Advance directive discussed with you today. Even though you declined this today please call our office should you change your mind and we can give you the proper paperwork for you to fill out.  Conditions/risks identified: none  Next appointment: Follow up in one year for your annual wellness visit.   Preventive Care 40-64 Years, Female Preventive care refers to lifestyle choices and visits with your health care provider that can promote health and wellness. What does preventive care include? A yearly physical exam. This is also called an annual well check. Dental exams once or twice a year. Routine eye exams. Ask your health care provider how often you should have your eyes checked. Personal lifestyle choices, including: Daily care of your teeth and gums. Regular physical activity. Eating a healthy diet. Avoiding tobacco and drug use. Limiting alcohol use. Practicing safe sex. Taking low-dose aspirin daily starting at age 56. Taking vitamin and mineral supplements as recommended by your health care provider. What happens during an annual well check? The services and screenings done by your health care provider during your annual well check will depend on your age, overall health, lifestyle risk  factors, and family history of disease. Counseling  Your health care provider may ask you questions about your: Alcohol use. Tobacco use. Drug use. Emotional well-being. Home and relationship well-being. Sexual activity. Eating habits. Work and work Statistician. Method of birth control. Menstrual cycle. Pregnancy history. Screening  You may have the following tests or measurements: Height, weight, and BMI. Blood pressure. Lipid and cholesterol levels. These may be checked every 5 years, or more frequently if you are over 66 years old. Skin check. Lung cancer screening. You may have this screening every year starting at age 96 if you have a 30-pack-year history of smoking and currently smoke or have quit within the past 15 years. Fecal occult blood test (FOBT) of the stool. You may have this test every year starting at age 88. Flexible sigmoidoscopy or colonoscopy. You may have a sigmoidoscopy every 5 years or a colonoscopy every 10 years starting at age 54. Hepatitis C blood test. Hepatitis B blood test. Sexually transmitted disease (STD) testing. Diabetes screening. This is done by checking your blood sugar (glucose) after you have not eaten for a while (fasting). You may have this done every 1-3 years. Mammogram. This may be done every 1-2 years. Talk to your health care provider about when you should start having regular mammograms. This may depend on whether you have a family history of breast cancer. BRCA-related cancer screening. This may be done if you have a family history of breast, ovarian, tubal, or peritoneal cancers. Pelvic exam and Pap test. This may be done every 3 years starting at age 70. Starting at age 72, this may be done every 5 years if you have a Pap test in combination with an HPV test. Bone density scan. This is done to screen for  osteoporosis. You may have this scan if you are at high risk for osteoporosis. Discuss your test results, treatment options, and if  necessary, the need for more tests with your health care provider. Vaccines  Your health care provider may recommend certain vaccines, such as: Influenza vaccine. This is recommended every year. Tetanus, diphtheria, and acellular pertussis (Tdap, Td) vaccine. You may need a Td booster every 10 years. Zoster vaccine. You may need this after age 68. Pneumococcal 13-valent conjugate (PCV13) vaccine. You may need this if you have certain conditions and were not previously vaccinated. Pneumococcal polysaccharide (PPSV23) vaccine. You may need one or two doses if you smoke cigarettes or if you have certain conditions. Talk to your health care provider about which screenings and vaccines you need and how often you need them. This information is not intended to replace advice given to you by your health care provider. Make sure you discuss any questions you have with your health care provider. Document Released: 10/26/2015 Document Revised: 06/18/2016 Document Reviewed: 07/31/2015 Elsevier Interactive Patient Education  2017 [place] Prevention in the Home Falls can cause injuries. They can happen to people of all ages. There are many things you can do to make your home safe and to help prevent falls. What can I do on the outside of my home? Regularly fix the edges of walkways and driveways and fix any cracks. Remove anything that might make you trip as you walk through a door, such as a raised step or threshold. Trim any bushes or trees on the path to your home. Use bright outdoor lighting. Clear any walking paths of anything that might make someone trip, such as rocks or tools. Regularly check to see if handrails are loose or broken. Make sure that both sides of any steps have handrails. Any raised decks and porches should have guardrails on the edges. Have any leaves, snow, or ice cleared regularly. Use sand or salt on walking paths during winter. Clean up any spills in your  garage right away. This includes oil or grease spills. What can I do in the bathroom? Use night lights. Install grab bars by the toilet and in the tub and shower. Do not use towel bars as grab bars. Use non-skid mats or decals in the tub or shower. If you need to sit down in the shower, use a plastic, non-slip stool. Keep the floor dry. Clean up any water that spills on the floor as soon as it happens. Remove soap buildup in the tub or shower regularly. Attach bath mats securely with double-sided non-slip rug tape. Do not have throw rugs and other things on the floor that can make you trip. What can I do in the bedroom? Use night lights. Make sure that you have a light by your bed that is easy to reach. Do not use any sheets or blankets that are too big for your bed. They should not hang down onto the floor. Have a firm chair that has side arms. You can use this for support while you get dressed. Do not have throw rugs and other things on the floor that can make you trip. What can I do in the kitchen? Clean up any spills right away. Avoid walking on wet floors. Keep items that you use a lot in easy-to-reach places. If you need to reach something above you, use a strong step stool that has a grab bar. Keep electrical cords out of the way. Do not  use floor polish or wax that makes floors slippery. If you must use wax, use non-skid floor wax. Do not have throw rugs and other things on the floor that can make you trip. What can I do with my stairs? Do not leave any items on the stairs. Make sure that there are handrails on both sides of the stairs and use them. Fix handrails that are broken or loose. Make sure that handrails are as long as the stairways. Check any carpeting to make sure that it is firmly attached to the stairs. Fix any carpet that is loose or worn. Avoid having throw rugs at the top or bottom of the stairs. If you do have throw rugs, attach them to the floor with carpet  tape. Make sure that you have a light switch at the top of the stairs and the bottom of the stairs. If you do not have them, ask someone to add them for you. What else can I do to help prevent falls? Wear shoes that: Do not have high heels. Have rubber bottoms. Are comfortable and fit you well. Are closed at the toe. Do not wear sandals. If you use a stepladder: Make sure that it is fully opened. Do not climb a closed stepladder. Make sure that both sides of the stepladder are locked into place. Ask someone to hold it for you, if possible. Clearly mark and make sure that you can see: Any grab bars or handrails. First and last steps. Where the edge of each step is. Use tools that help you move around (mobility aids) if they are needed. These include: Canes. Walkers. Scooters. Crutches. Turn on the lights when you go into a dark area. Replace any light bulbs as soon as they burn out. Set up your furniture so you have a clear path. Avoid moving your furniture around. If any of your floors are uneven, fix them. If there are any pets around you, be aware of where they are. Review your medicines with your doctor. Some medicines can make you feel dizzy. This can increase your chance of falling. Ask your doctor what other things that you can do to help prevent falls. This information is not intended to replace advice given to you by your health care provider. Make sure you discuss any questions you have with your health care provider. Document Released: 07/26/2009 Document Revised: 03/06/2016 Document Reviewed: 11/03/2014 Elsevier Interactive Patient Education  2017 [employer].

## 2021-07-24 NOTE — Progress Notes (Signed)
This visit occurred during the SARS-CoV-2 public health emergency.  Safety protocols were in place, including screening questions prior to the visit, additional usage of staff PPE, and extensive cleaning of exam room while observing appropriate contact time as indicated for disinfecting solutions.  Subjective:   Brittany Crawford is a 65 y.o. female who presents for Medicare Annual (Subsequent) preventive examination.  Review of Systems     Cardiac Risk Factors include: hypertension;dyslipidemia;obesity (BMI >30kg/m2)     Objective:    Today's Vitals   07/24/21 1146 07/24/21 1152  BP: 128/70   Pulse: 96   Temp: 98.7 F (37.1 C)   TempSrc: Oral   SpO2: 96%   Weight: 211 lb (95.7 kg)   Height: 5\' 8"  (1.727 m)   PainSc:  8    Body mass index is 32.08 kg/m.  Advanced Directives 07/24/2021 08/30/2020 04/13/2019 07/29/2017 06/10/2017 04/17/2017 04/10/2017  Does Patient Have a Medical Advance Directive? No No No No No No No  Does patient want to make changes to medical advance directive? - - - - - - -  Would patient like information on creating a medical advance directive? No - Patient declined No - Patient declined No - Patient declined - - No - Patient declined -  Pre-existing out of facility DNR order (yellow form or pink MOST form) - - - - - - -    Current Medications (verified) Outpatient Encounter Medications as of 07/24/2021  Medication Sig   Ascorbic Acid (VITAMIN C) 1000 MG tablet Take 1,000 mg by mouth daily. Takes when coming down with a cold.   azelastine (OPTIVAR) 0.05 % ophthalmic solution SMARTSIG:1 Drop(s) In Eye(s) Twice Daily PRN   diclofenac sodium (VOLTAREN) 1 % GEL Apply 2 g topically 4 (four) times daily.   gentamicin cream (GARAMYCIN) 0.1 % Apply 1 application topically 2 (two) times daily.   HYDROcodone-acetaminophen (NORCO) 7.5-325 MG tablet Take 1 tablet by mouth every 8 (eight) hours as needed for moderate pain.   Hypromellose (ARTIFICIAL TEARS OP) Place 1  drop into the right eye daily as needed (for dry eyes).   losartan-hydrochlorothiazide (HYZAAR) 100-12.5 MG tablet Take 1 tablet by mouth daily.   Multiple Vitamin (MULTIVITAMIN WITH MINERALS) TABS tablet Take 1 tablet by mouth daily.   Olopatadine HCl 0.2 % SOLN Apply 1 drop to eye daily as needed for allergies.   prednisoLONE acetate (PRED FORTE) 1 % ophthalmic suspension Place 1 drop into the left eye as needed (flare ups).    triamcinolone cream (KENALOG) 0.1 % Apply 1 application topically at bedtime as needed (for rash).   No facility-administered encounter medications on file as of 07/24/2021.    Allergies (verified) Gadobenate   History: Past Medical History:  Diagnosis Date   Chronic back pain    H/O sarcoidosis    Hypertension    Seasonal allergies    Stroke [facility])    Past Surgical History:  Procedure Laterality Date   BREAST BIOPSY Left 04/29/2016   BREAST BIOPSY Left 04/23/2016   DILATATION & CURRETTAGE/HYSTEROSCOPY WITH RESECTOCOPE N/A 12/10/2012   Procedure: DILATATION & CURETTAGE/HYSTEROSCOPY WITH RESECTOCOPE;  Surgeon: Marvene Staff, MD;  Location: [facility];  Service: Gynecology;  Laterality: N/A;   FOOT SURGERY     left-pins placed   LYMPH NODE BIOPSY     POLYPECTOMY N/A 12/10/2012   Procedure: POLYPECTOMY;  Surgeon: Marvene Staff, MD;  Location: [facility];  Service: Gynecology;  Laterality: N/A;   STRABISMUS SURGERY Left 04/17/2017  Procedure: REPAIR STRABISMUS LEFT EYE;  Surgeon: Everitt Amber, MD;  Location: [facility];  Service: Ophthalmology;  Laterality: Left;   Family History  Problem Relation Age of Onset   Colon cancer Maternal Uncle    Stroke Mother    Social History   Socioeconomic History   Marital status: Married    Spouse name: Not on file   Number of children: 2   Years of education: 12   Highest education level: Not on file  Occupational History   Occupation: disability  Tobacco Use   Smoking status: Former     Packs/day: 0.00    Years: 0.00    Pack years: 0.00    Types: Cigarettes    Quit date: 03/13/2017    Years since quitting: 4.3   Smokeless tobacco: Never   Tobacco comments:    4  to 5  cigarettes daily for years  Vaping Use   Vaping Use: Never used  Substance and Sexual Activity   Alcohol use: Not Currently    Comment: occassionally   Drug use: Yes    Types: Hydrocodone   Sexual activity: Not Currently  Other Topics Concern   Not on file  Social History Narrative   Patient is married with 2 children.   Patient is right handed.   Patient has hs education.   Patient drinks 4 cups daily.   Social Determinants of Health   Financial Resource Strain: Low Risk    Difficulty of Paying Living Expenses: Not hard at all  Food Insecurity: No Food Insecurity   Worried About Charity fundraiser in the Last Year: Never true   Juda in the Last Year: Never true  Transportation Needs: No Transportation Needs   Lack of Transportation (Medical): No   Lack of Transportation (Non-Medical): No  Physical Activity: Insufficiently Active   Days of Exercise per Week: 2 days   Minutes of Exercise per Session: 10 min  Stress: No Stress Concern Present   Feeling of Stress : Not at all  Social Connections: Not on file    Tobacco Counseling Counseling given: Not Answered Tobacco comments: 4  to 5  cigarettes daily for years   Clinical Intake:  Pre-visit preparation completed: Yes  Pain : 0-10 Pain Score: 8  Pain Type: Chronic pain Pain Location: Back Pain Orientation: Lower Pain Descriptors / Indicators: Aching Pain Onset: More than a month ago Pain Frequency: Constant     Diabetes: No  How often do you need to have someone help you when you read instructions, pamphlets, or other written materials from your doctor or pharmacy?: 1 - Never  Diabetic? no  Interpreter Needed?: No  Information entered by :: NAllen LPN   Activities of Daily Living In your present state  of health, do you have any difficulty performing the following activities: 07/24/2021 08/30/2020  Hearing? N N  Vision? Y Y  Difficulty concentrating or making decisions? Y N  Comment sometimes brief memory issues -  Walking or climbing stairs? N N  Dressing or bathing? N N  Doing errands, shopping? Y N  Comment does not drive Facilities manager and eating ? N N  Using the Toilet? N N  In the past six months, have you accidently leaked urine? N N  Do you have problems with loss of bowel control? N N  Managing your Medications? N N  Managing your Finances? N N  Housekeeping or managing your Housekeeping? N -  Some recent data might be hidden    Patient Care Team: [name], FNP as PCP - General (General Practice)  Indicate any recent Medical Services you may have received from other than [facility] providers in the past year (date may be approximate).     Assessment:   This is a routine wellness examination for Brittany Crawford.  Hearing/Vision screen Vision Screening - Comments:: Regular eye exams, Dr. Prudencio Burly  Dietary issues and exercise activities discussed: Current Exercise Habits: Home exercise routine, Type of exercise: Other - see comments (stationary bike), Time (Minutes): 10, Frequency (Times/Week): 2, Weekly Exercise (Minutes/Week): 20   Goals Addressed             This Visit's Progress    Patient Stated       07/24/2021, eat healthy       Depression Screen PHQ 2/9 Scores 07/24/2021 04/12/2021 08/30/2020 08/23/2020 06/26/2020 06/22/2020 04/30/2020  PHQ - 2 Score 0 0 0 0 0 0 0  PHQ- 9 Score - - - - - 0 -    Fall Risk Fall Risk  07/24/2021 06/13/2021 04/12/2021 02/12/2021 12/11/2020  Falls in the past year? 0 0 0 0 0  Number falls in past yr: - - 0 - -  Injury with Fall? - - - - -  Risk for fall due to : Medication side effect - - - -  Follow up Falls evaluation completed;Education provided;Falls prevention discussed - - - -    FALL RISK PREVENTION PERTAINING TO THE  HOME:  Any stairs in or around the home? Yes  If so, are there any without handrails? No  Home free of loose throw rugs in walkways, pet beds, electrical cords, etc? Yes  Adequate lighting in your home to reduce risk of falls? Yes   ASSISTIVE DEVICES UTILIZED TO PREVENT FALLS:  Life alert? No  Use of a cane, walker or w/c? No  Grab bars in the bathroom? Yes  Shower chair or bench in shower? Yes  Elevated toilet seat or a handicapped toilet? Yes   TIMED UP AND GO:  Was the test performed? No .    Gait steady and fast without use of assistive device  Cognitive Function:     6CIT Screen 07/24/2021 08/30/2020 04/13/2019  What Year? 0 points 0 points 0 points  What month? 0 points 0 points 0 points  What time? 0 points 0 points 0 points  Count back from 20 0 points 2 points 0 points  Months in reverse 4 points 2 points 2 points  Repeat phrase 10 points 4 points 2 points  Total Score 14 8 4     Immunizations Immunization History  Administered Date(s) Administered   Influenza,inj,Quad PF,6+ Mos 07/08/2014, 08/12/2018, 09/07/2019, 07/24/2021   Influenza-Unspecified 08/11/2018   Moderna Sars-Covid-2 Vaccination 12/30/2019, 01/30/2020   Pneumococcal Polysaccharide-23 07/08/2014   Zoster Recombinat (Shingrix) 03/06/2020    TDAP status: Due, Education has been provided regarding the importance of this vaccine. Advised may receive this vaccine at local pharmacy or Health Dept. Aware to provide a copy of the vaccination record if obtained from local pharmacy or Health Dept. Verbalized acceptance and understanding.  Flu Vaccine status: Completed at today's visit  Pneumococcal vaccine status: Up to date  Covid-19 vaccine status: Completed vaccines  Qualifies for Shingles Vaccine? Yes   Zostavax completed No   Shingrix Completed?: only had 1 does  Screening Tests Health Maintenance  Topic Date Due   COVID-19 Vaccine (3 - Moderna risk series) 02/27/2020   TETANUS/TDAP  08/30/2021 (Originally 09/08/1975)   Zoster Vaccines- Shingrix (2 of 2) 10/24/2021 (Originally 05/01/2020)   COLONOSCOPY (Pts 45-19yrs Insurance coverage will need to be confirmed)  07/05/2022   MAMMOGRAM  07/23/2022   PAP SMEAR-Modifier  09/06/2022   INFLUENZA VACCINE  Completed   Hepatitis C Screening  Completed   HIV Screening  Completed   HPV VACCINES  Aged Out    Health Maintenance  Health Maintenance Due  Topic Date Due   COVID-19 Vaccine (3 - Moderna risk series) 02/27/2020    Colorectal cancer screening: Type of screening: Colonoscopy. Completed 07/05/2012. Repeat every 10 years  Mammogram status: Completed 07/23/2020. Repeat every year  Bone Density status: n/a  Lung Cancer Screening: (Low Dose CT Chest recommended if Age 81-80 years, 30 pack-year currently smoking OR have quit w/in 15years.) does not qualify.   Lung Cancer Screening Referral: no  Additional Screening:  Hepatitis C Screening: does qualify; Completed 09/01/2018  Vision Screening: Recommended annual ophthalmology exams for early detection of glaucoma and other disorders of the eye. Is the patient up to date with their annual eye exam?  Yes  Who is the provider or what is the name of the office in which the patient attends annual eye exams? Dr. Prudencio Burly If pt is not established with a provider, would they like to be referred to a provider to establish care? No .   Dental Screening: Recommended annual dental exams for proper oral hygiene  Community Resource Referral / Chronic Care Management: CRR required this visit?  No   CCM required this visit?  No      Plan:     I have personally reviewed and noted the following in the patient's chart:   Medical and social history Use of alcohol, tobacco or illicit drugs  Current medications and supplements including opioid prescriptions.  Functional ability and status Nutritional status Physical activity Advanced directives List of other  physicians Hospitalizations, surgeries, and ER visits in previous 12 months Vitals Screenings to include cognitive, depression, and falls Referrals and appointments  In addition, I have reviewed and discussed with patient certain preventive protocols, quality metrics, and best practice recommendations. A written personalized care plan for preventive services as well as general preventive health recommendations were provided to patient.     Kellie Simmering, LPN   [id number]   Nurse Notes:

## 2021-07-25 ENCOUNTER — Other Ambulatory Visit: Payer: Self-pay | Admitting: Nurse Practitioner

## 2021-07-25 ENCOUNTER — Telehealth: Payer: Self-pay | Admitting: Registered Nurse

## 2021-07-25 DIAGNOSIS — Z1231 Encounter for screening mammogram for malignant neoplasm of breast: Secondary | ICD-10-CM

## 2021-07-25 NOTE — Telephone Encounter (Signed)
Patient only granted a 7 day supply. Appeal sent for grievance because longer therapy was not checked by Jorja Loa. Patient informed about the matter. REF: [id number]

## 2021-07-25 NOTE — Telephone Encounter (Signed)
Received a call from [facility] and Ms. Detlefsen regarding her PA  on her hydrocodone. Explain to Ms. Leaming the process, Robbin CMA spoke with Ms. Reddinger this morning as well. Ms. taran hable understanding and all questions answered.

## 2021-08-13 ENCOUNTER — Encounter: Payer: Self-pay | Admitting: Registered Nurse

## 2021-08-13 ENCOUNTER — Encounter: Payer: Medicare Other | Attending: Registered Nurse | Admitting: Registered Nurse

## 2021-08-13 ENCOUNTER — Other Ambulatory Visit: Payer: Self-pay

## 2021-08-13 VITALS — BP 120/80 | HR 90 | Ht 68.0 in | Wt 209.6 lb

## 2021-08-13 DIAGNOSIS — Z79891 Long term (current) use of opiate analgesic: Secondary | ICD-10-CM | POA: Diagnosis not present

## 2021-08-13 DIAGNOSIS — Z5181 Encounter for therapeutic drug level monitoring: Secondary | ICD-10-CM | POA: Insufficient documentation

## 2021-08-13 DIAGNOSIS — M2550 Pain in unspecified joint: Secondary | ICD-10-CM | POA: Diagnosis not present

## 2021-08-13 DIAGNOSIS — M47816 Spondylosis without myelopathy or radiculopathy, lumbar region: Secondary | ICD-10-CM | POA: Diagnosis not present

## 2021-08-13 DIAGNOSIS — G894 Chronic pain syndrome: Secondary | ICD-10-CM | POA: Insufficient documentation

## 2021-08-13 MED ORDER — HYDROCODONE-ACETAMINOPHEN 7.5-325 MG PO TABS: 1.0000 | ORAL_TABLET | Freq: Three times a day (TID) | ORAL | 0 refills | Status: DC | PRN

## 2021-08-13 NOTE — Progress Notes (Signed)
Subjective:    Patient ID: Brittany Crawford, female    DOB: 1955/12/08, 65 y.o.   MRN: [id number]  HPI: Brittany Crawford is a 65 y.o. female who returns for follow up appointment for chronic pain and medication refill. She states her pain is located in her neck and lower back. She rates her pain 5. Her current exercise regime is walking and performing stretching exercises.  Brittany Crawford Morphine equivalent is 22.50 MME.   UDS ordered today.      Pain Inventory Average Pain 7 Pain Right Now 5 My pain is intermittent, sharp, and dull  In the last 24 hours, has pain interfered with the following? General activity 7 Relation with others 0 Enjoyment of life 10 What TIME of day is your pain at its worst? evening Sleep (in general) Fair  Pain is worse with: bending, standing, and some activites Pain improves with: medication Relief from Meds: 10  Family History  Problem Relation Age of Onset   Colon cancer Maternal Uncle    Stroke Mother    Social History   Socioeconomic History   Marital status: Married    Spouse name: Not on file   Number of children: 2   Years of education: 12   Highest education level: Not on file  Occupational History   Occupation: disability  Tobacco Use   Smoking status: Former    Packs/day: 0.00    Years: 0.00    Pack years: 0.00    Types: Cigarettes    Quit date: 03/13/2017    Years since quitting: 4.4   Smokeless tobacco: Never   Tobacco comments:    4  to 5  cigarettes daily for years  Vaping Use   Vaping Use: Never used  Substance and Sexual Activity   Alcohol use: Not Currently    Comment: occassionally   Drug use: Yes    Types: Hydrocodone   Sexual activity: Not Currently  Other Topics Concern   Not on file  Social History Narrative   Patient is married with 2 children.   Patient is right handed.   Patient has hs education.   Patient drinks 4 cups daily.   Social Determinants of Health   Financial Resource Strain: Low  Risk    Difficulty of Paying Living Expenses: Not hard at all  Food Insecurity: No Food Insecurity   Worried About Charity fundraiser in the Last Year: Never true   [place] in the Last Year: Never true  Transportation Needs: No Transportation Needs   Lack of Transportation (Medical): No   Lack of Transportation (Non-Medical): No  Physical Activity: Insufficiently Active   Days of Exercise per Week: 2 days   Minutes of Exercise per Session: 10 min  Stress: No Stress Concern Present   Feeling of Stress : Not at all  Social Connections: Not on file   Past Surgical History:  Procedure Laterality Date   BREAST BIOPSY Left 04/29/2016   BREAST BIOPSY Left 04/23/2016   DILATATION & CURRETTAGE/HYSTEROSCOPY WITH RESECTOCOPE N/A 12/10/2012   Procedure: [place];  Surgeon: Marvene Staff, MD;  Location: [facility];  Service: Gynecology;  Laterality: N/A;   FOOT SURGERY     left-pins placed   LYMPH NODE BIOPSY     POLYPECTOMY N/A 12/10/2012   Procedure: POLYPECTOMY;  Surgeon: Marvene Staff, MD;  Location: [facility];  Service: Gynecology;  Laterality: N/A;   STRABISMUS SURGERY Left 04/17/2017  Procedure: REPAIR STRABISMUS LEFT EYE;  Surgeon: Everitt Amber, MD;  Location: [facility];  Service: Ophthalmology;  Laterality: Left;   Past Surgical History:  Procedure Laterality Date   BREAST BIOPSY Left 04/29/2016   BREAST BIOPSY Left 04/23/2016   DILATATION & CURRETTAGE/HYSTEROSCOPY WITH RESECTOCOPE N/A 12/10/2012   Procedure: DILATATION & CURETTAGE/HYSTEROSCOPY WITH RESECTOCOPE;  Surgeon: Marvene Staff, MD;  Location: [facility];  Service: Gynecology;  Laterality: N/A;   FOOT SURGERY     left-pins placed   LYMPH NODE BIOPSY     POLYPECTOMY N/A 12/10/2012   Procedure: POLYPECTOMY;  Surgeon: Marvene Staff, MD;  Location: [facility];  Service: Gynecology;  Laterality: N/A;   STRABISMUS SURGERY Left 04/17/2017   Procedure:  REPAIR STRABISMUS LEFT EYE;  Surgeon: Everitt Amber, MD;  Location: [facility];  Service: Ophthalmology;  Laterality: Left;   Past Medical History:  Diagnosis Date   Chronic back pain    H/O sarcoidosis    Hypertension    Seasonal allergies    Stroke ([place])    LMP 11/13/2012   Opioid Risk Score:   Fall Risk Score:  `1  Depression screen PHQ 2/9  Depression screen [facility] 2/9 07/24/2021 04/12/2021 08/30/2020 08/23/2020 06/26/2020 06/22/2020 04/30/2020  Decreased Interest 0 0 0 0 0 - 0  Down, Depressed, Hopeless 0 0 0 0 0 0 0  PHQ - 2 Score 0 0 0 0 0 0 0  Altered sleeping - - - - - 0 -  Tired, decreased energy - - - - - 0 -  Change in appetite - - - - - 0 -  Feeling bad or failure about yourself  - - - - - 0 -  Trouble concentrating - - - - - 0 -  Moving slowly or fidgety/restless - - - - - 0 -  Suicidal thoughts - - - - - 0 -  PHQ-9 Score - - - - - 0 -  Some recent data might be hidden    Review of Systems  Musculoskeletal:  Positive for back pain.  All other systems reviewed and are negative.     Objective:   Physical Exam Vitals and nursing note reviewed.  Constitutional:      Appearance: Normal appearance.  Cardiovascular:     Rate and Rhythm: Normal rate and regular rhythm.     Pulses: Normal pulses.     Heart sounds: Normal heart sounds.  Pulmonary:     Effort: Pulmonary effort is normal.     Breath sounds: Normal breath sounds.  Musculoskeletal:     Cervical back: Normal range of motion and neck supple.     Comments: Normal Muscle Bulk and Muscle Testing Reveals:  Upper Extremities: Full ROM and Muscle Strength 5/5 Lumbar Paraspinal Tenderness: L-3-L-5 Lower Extremities: Full ROM and Muscle Strength 5/5 Arises from Table with ease Narrow Based  Gait     Skin:    General: Skin is warm and dry.  Neurological:     Mental Status: She is alert and oriented to person, place, and time.  Psychiatric:        Mood and Affect: Mood normal.         Behavior: Behavior normal.         Assessment & Plan:  1. Lumbar spondylosis with DDD and facet arthropathy.Left Lumbar Radiculitis: Continue HEP as Tolerated. Continue to Monitor.  08/13/2021 Refilled: Hydrocodone 7.5 /325 mg one tablet every 8 hours as needed for pain. Increased to  #  90 tablets. . Second script e-scribe for the following month. We will continue the opioid monitoring program, this consists of regular clinic visits, examinations, urine drug screen, pill counts as well as use of [place] Controlled Substance Reporting system. A 12 month History has been reviewed on the [facility] on 08/13/2021. 2. Bilateral knee pain: No complaints voiced today: Continue with heat/ice, exercise and Diclofenac. 08/13/2021 3. Right thalamic/internal capsule lacunar infarct with persistent left hemisensory deficits: Neurology Following. Continue to Monitor. 08/13/2021. 4.Bilateral Greater Trochanteric Bursitis: Continue HEP as Tolerated and Alternate Ice and Heat Therapy.  Continue current medication regime. Continue to Monitor.  08/13/2021. 5. Polyarthralgia: Continue HEP as Tolerated. Continue to monitor. 08/13/2021    F/U in 2 months

## 2021-08-15 ENCOUNTER — Telehealth: Payer: Self-pay

## 2021-08-15 DIAGNOSIS — M47816 Spondylosis without myelopathy or radiculopathy, lumbar region: Secondary | ICD-10-CM

## 2021-08-15 MED ORDER — HYDROCODONE-ACETAMINOPHEN 7.5-325 MG PO TABS: 1.0000 | ORAL_TABLET | Freq: Three times a day (TID) | ORAL | 0 refills | Status: DC | PRN

## 2021-08-15 NOTE — Telephone Encounter (Signed)
[facility] on [address] is closed. Patient has requested her pain medication to be sent to [facility] on [address].    Call back phone [phone]. Thank you

## 2021-08-15 NOTE — Telephone Encounter (Addendum)
Patient stated her medications can go to [facility] on [address] stated she would like the 7 day supply of her Hydrocodone sent to [facility]. She also wants to know when we do get in touch with [facility] on [address] if we could cancel the prescription and send it to the [facility] on [address].

## 2021-08-15 NOTE — Telephone Encounter (Signed)
PMP was Reviewed.  Hydrocodone e-scribed today, Brittany Crawford is aware of the above and verbalizes understanding.

## 2021-08-15 NOTE — Telephone Encounter (Deleted)
Patient stated she would like the 7 day supply of her Hydrocodone sent to [facility]. She also wants to know when we do get in touch with [facility] on [address] if we could cancel the prescription and send it to the [facility] on [address].

## 2021-08-17 LAB — TOXASSURE SELECT,+ANTIDEPR,UR

## 2021-08-19 ENCOUNTER — Telehealth: Payer: Self-pay

## 2021-08-19 DIAGNOSIS — M47816 Spondylosis without myelopathy or radiculopathy, lumbar region: Secondary | ICD-10-CM

## 2021-08-19 MED ORDER — HYDROCODONE-ACETAMINOPHEN 7.5-325 MG PO TABS: 1.0000 | ORAL_TABLET | Freq: Three times a day (TID) | ORAL | 0 refills | Status: DC | PRN

## 2021-08-19 NOTE — Telephone Encounter (Signed)
PMP was Reviewed. Hydrocodone e-scribed today to [facility] on [address].

## 2021-08-19 NOTE — Telephone Encounter (Signed)
Spoke with patient to inform her that I have been talking with Suezanne Jacquet from [facility] to see if they can somehow get the prescription cancelled at [facility] so she can get the rest of her prescription on her insurance.

## 2021-08-19 NOTE — Addendum Note (Signed)
Addended by: Bayard Hugger on: 08/19/2021 04:09 PM   Modules accepted: Orders

## 2021-08-19 NOTE — Telephone Encounter (Signed)
Patient went to [facility] and spoke with pharmacist while on the phone with me, Isla Pence, CMA. He cancelled pain med prescriptions for November and December. Informed patient that I will call [facility] to inform them that it has been cancelled and it is okay to fill new prescription. Please send remaining weeks of medication to [facility].

## 2021-08-20 ENCOUNTER — Telehealth: Payer: Self-pay | Admitting: *Deleted

## 2021-08-20 NOTE — Telephone Encounter (Signed)
Urine drug screen for this encounter is consistent for prescribed medication 

## 2021-08-26 ENCOUNTER — Telehealth: Payer: Self-pay

## 2021-08-26 NOTE — Telephone Encounter (Signed)
Patient called to inquire why no refills were on her bottle of Hydrocodone. Explained to patient that with controlled substances, we cannot put refills on the bottle. Did inform her that the December prescription was at the pharmacy. Verbalized understanding

## 2021-08-29 ENCOUNTER — Other Ambulatory Visit: Payer: Self-pay

## 2021-08-29 ENCOUNTER — Ambulatory Visit
Admission: RE | Admit: 2021-08-29 | Discharge: 2021-08-29 | Disposition: A | Discharge status: Home / Self Care | Payer: Medicare Other | Source: Ambulatory Visit | Attending: Nurse Practitioner | Admitting: Nurse Practitioner

## 2021-08-29 DIAGNOSIS — Z1231 Encounter for screening mammogram for malignant neoplasm of breast: Secondary | ICD-10-CM | POA: Diagnosis not present

## 2021-09-03 ENCOUNTER — Encounter: Payer: Self-pay | Admitting: Nurse Practitioner

## 2021-09-03 ENCOUNTER — Ambulatory Visit (INDEPENDENT_AMBULATORY_CARE_PROVIDER_SITE_OTHER): Payer: Medicare Other | Admitting: Nurse Practitioner

## 2021-09-03 ENCOUNTER — Other Ambulatory Visit: Payer: Self-pay

## 2021-09-03 VITALS — BP 124/80 | HR 94 | Temp 99.4°F | Ht 68.0 in | Wt 206.0 lb

## 2021-09-03 DIAGNOSIS — Z23 Encounter for immunization: Secondary | ICD-10-CM | POA: Diagnosis not present

## 2021-09-03 DIAGNOSIS — I700 Atherosclerosis of aorta: Secondary | ICD-10-CM

## 2021-09-03 DIAGNOSIS — I129 Hypertensive chronic kidney disease with stage 1 through stage 4 chronic kidney disease, or unspecified chronic kidney disease: Secondary | ICD-10-CM | POA: Diagnosis not present

## 2021-09-03 DIAGNOSIS — R7309 Other abnormal glucose: Secondary | ICD-10-CM | POA: Diagnosis not present

## 2021-09-03 DIAGNOSIS — I10 Essential (primary) hypertension: Secondary | ICD-10-CM

## 2021-09-03 DIAGNOSIS — E6609 Other obesity due to excess calories: Secondary | ICD-10-CM | POA: Diagnosis not present

## 2021-09-03 DIAGNOSIS — E782 Mixed hyperlipidemia: Secondary | ICD-10-CM

## 2021-09-03 DIAGNOSIS — E1122 Type 2 diabetes mellitus with diabetic chronic kidney disease: Secondary | ICD-10-CM | POA: Diagnosis not present

## 2021-09-03 DIAGNOSIS — N183 Chronic kidney disease, stage 3 unspecified: Secondary | ICD-10-CM | POA: Diagnosis not present

## 2021-09-03 DIAGNOSIS — Z2821 Immunization not carried out because of patient refusal: Secondary | ICD-10-CM

## 2021-09-03 DIAGNOSIS — N1831 Chronic kidney disease, stage 3a: Secondary | ICD-10-CM

## 2021-09-03 DIAGNOSIS — Z6832 Body mass index (BMI) 32.0-32.9, adult: Secondary | ICD-10-CM

## 2021-09-03 MED ORDER — TETANUS-DIPHTH-ACELL PERTUSSIS 5-2.5-18.5 LF-MCG/0.5 IM SUSP: 0.5000 mL | Freq: Once | INTRAMUSCULAR | 0 refills | Status: AC

## 2021-09-03 NOTE — Progress Notes (Signed)
I,Brittany Crawford,acting as a Education administrator for [employer], FNP.,have documented all relevant documentation on the behalf of Brittany Brine, FNP,as directed by  Brittany Brine, FNP while in the presence of Brittany Crawford, Brittany Crawford.   This visit occurred during the SARS-CoV-2 public health emergency.  Safety protocols were in place, including screening questions prior to the visit, additional usage of staff PPE, and extensive cleaning of exam room while observing appropriate contact time as indicated for disinfecting solutions.  Subjective:     Patient ID: Brittany Crawford , female    DOB: [birth year] , 65 y.o.   MRN: [id number]   Chief Complaint  Patient presents with   Hypertension    HPI  The patient is here today for a blood pressure f/u.  She has been checking her blood pressure at home which was negative. [facility] due to recent break ins.   Hypertension This is a chronic problem. The current episode started more than 1 year ago. The problem is unchanged. The problem is controlled. Pertinent negatives include no anxiety, chest pain, headaches or palpitations. There are no associated agents to hypertension. There are no known risk factors for coronary artery disease. Past treatments include calcium channel blockers and angiotensin blockers. The current treatment provides no improvement. There are no compliance problems.  Hypertensive end-organ damage includes kidney disease and CVA. There is no history of angina. Identifiable causes of hypertension include chronic renal disease.    Past Medical History:  Diagnosis Date   Chronic back pain    H/O sarcoidosis    Hypertension    Seasonal allergies    Stroke [facility])      Family History  Problem Relation Age of Onset   Stroke Mother    Colon cancer Maternal Uncle    Breast cancer Neg Hx      Current Outpatient Medications:    Ascorbic Acid (VITAMIN C) 1000 MG tablet, Take 1,000 mg by mouth daily. Takes when coming down with a  cold., Disp: , Rfl:    azelastine (OPTIVAR) 0.05 % ophthalmic solution, SMARTSIG:1 Drop(s) In Eye(s) Twice Daily PRN, Disp: , Rfl:    diclofenac sodium (VOLTAREN) 1 % GEL, Apply 2 g topically 4 (four) times daily., Disp: 300 g, Rfl: 2   HYDROcodone-acetaminophen (NORCO) 7.5-325 MG tablet, Take 1 tablet by mouth every 8 (eight) hours as needed for moderate pain. Do Not Fill Before 09/14/2021, Disp: 90 tablet, Rfl: 0   Hypromellose (ARTIFICIAL TEARS OP), Place 1 drop into the right eye daily as needed (for dry eyes)., Disp: , Rfl:    losartan-hydrochlorothiazide (HYZAAR) 100-12.5 MG tablet, Take 1 tablet by mouth daily., Disp: 90 tablet, Rfl: 1   Multiple Vitamin (MULTIVITAMIN WITH MINERALS) TABS tablet, Take 1 tablet by mouth daily., Disp: , Rfl:    Olopatadine HCl 0.2 % SOLN, Apply 1 drop to eye daily as needed for allergies., Disp: , Rfl: 3   prednisoLONE acetate (PRED FORTE) 1 % ophthalmic suspension, Place 1 drop into the left eye as needed (flare ups). , Disp: , Rfl:    triamcinolone cream (KENALOG) 0.1 %, Apply 1 application topically at bedtime as needed (for rash)., Disp: 30 g, Rfl: 1   Allergies  Allergen Reactions   Gadobenate Nausea And Vomiting    Pt was fine after a few minutes , vomiting after contrast injection smills      Review of Systems  Constitutional: Negative.   Respiratory: Negative.    Cardiovascular:  Negative for chest pain, palpitations  and leg swelling.  Neurological:  Negative for dizziness and headaches.  Psychiatric/Behavioral: Negative.      Today's Vitals   09/03/21 1028  BP: 124/80  Pulse: 94  Temp: 99.4 F (37.4 C)  Weight: 206 lb (93.4 kg)  Height: '5\' 8"'  (1.727 m)  PainSc: 0-No pain   Body mass index is 31.32 kg/m.  Wt Readings from Last 3 Encounters:  09/03/21 206 lb (93.4 kg)  08/13/21 209 lb 9.6 oz (95.1 kg)  07/24/21 211 lb (95.7 kg)    BP Readings from Last 3 Encounters:  09/03/21 124/80  08/13/21 120/80  07/24/21 128/70     Objective:  Physical Exam Vitals reviewed.  Constitutional:      General: She is not in acute distress.    Appearance: Normal appearance. She is well-developed. She is obese.  HENT:     Head: Normocephalic and atraumatic.     Right Ear: Hearing normal.     Left Ear: Hearing normal.     Nose:     Comments: Deferred - masked    Mouth/Throat:     Comments: Deferred - masked Eyes:     General: Lids are normal.     Extraocular Movements: Extraocular movements intact.     Conjunctiva/sclera: Conjunctivae normal.     Pupils: Pupils are equal, round, and reactive to light.     Funduscopic exam:    Right eye: No papilledema.        Left eye: No papilledema.  Neck:     Thyroid: No thyroid mass.     Vascular: No carotid bruit.  Cardiovascular:     Rate and Rhythm: Normal rate and regular rhythm.     Pulses: Normal pulses.     Heart sounds: Normal heart sounds. No murmur heard. Pulmonary:     Effort: Pulmonary effort is normal. No respiratory distress.     Breath sounds: Normal breath sounds. No wheezing.  Genitourinary:    Labia:        Right: No rash or tenderness.        Left: No rash or tenderness.      Vagina: Normal.     Cervix: Normal.     Uterus: Normal.      Adnexa: Right adnexa normal and left adnexa normal.  Musculoskeletal:        General: No swelling or tenderness. Normal range of motion.     Cervical back: Full passive range of motion without pain, normal range of motion and neck supple.  Skin:    General: Skin is warm and dry.     Capillary Refill: Capillary refill takes less than 2 seconds.     Coloration: Skin is not jaundiced.     Findings: No bruising.  Neurological:     General: No focal deficit present.     Mental Status: She is alert and oriented to person, place, and time.     Cranial Nerves: No cranial nerve deficit.     Sensory: No sensory deficit.  Psychiatric:        Mood and Affect: Mood normal.        Behavior: Behavior normal.        Thought  Content: Thought content normal.        Judgment: Judgment normal.        Assessment And Plan:     1. Benign hypertension with chronic kidney disease, stage III ([place]) Comments: Well controlled, continue current medications. Will check kidney functions - BMP8+EGFR  2.  Class 1 obesity due to excess calories without serious comorbidity with body mass index (BMI) of 32.0 to 32.9 in adult Chronic Discussed healthy diet and regular exercise options  Encouraged to exercise at least 150 minutes per week with 2 days of strength training She is encouraged to strive for BMI less than 30 to decrease cardiac risk.  3. Need for vaccination Rx sent to pharmacy  TDAP will be administered to adults 76-57 years old every 10 years. - Tdap (BOOSTRIX) 5-2.5-18.5 LF-MCG/0.5 injection; Inject 0.5 mLs into the muscle once for 1 dose.  Dispense: 0.5 mL; Refill: 0  4. Mixed hyperlipidemia Comments: Patient refuses statins due to side effects reported as having problems with head issues. will refer to lipid clinic  - Lipid panel - AMB Referral to [facility]  5. Abnormal glucose Comments: No current medications, discussed risk of prolonged prediabetes - Hemoglobin A1c  6. Aortic atherosclerosis ([place]) Comments: At this time she declines taking a statin as she reports having side effects, will refer to lipid clinic for further evaluation may benefit from injection - AMB Referral to [facility]  7. COVID-19 vaccination declined  Declines covid 19 vaccine. Discussed risk of covid 78 and if she changes her mind about the vaccine to call the office.  Encouraged to take multivitamin, vitamin d, vitamin c and zinc to increase immune system. Aware can call office if would like to have vaccine here at office.    Patient was given opportunity to ask questions. Patient verbalized understanding of the plan and was able to repeat key elements of the plan. All questions were  answered to their satisfaction.  Brittany Brine, FNP   I, Brittany Brine, FNP, have reviewed all documentation for this visit. The documentation on 09/03/21 for the exam, diagnosis, procedures, and orders are all accurate and complete.   IF YOU HAVE BEEN REFERRED TO A SPECIALIST, IT MAY TAKE 1-2 WEEKS TO SCHEDULE/PROCESS THE REFERRAL. IF YOU HAVE NOT HEARD FROM US/SPECIALIST IN TWO WEEKS, PLEASE GIVE [place] A CALL AT [phone].   THE PATIENT IS ENCOURAGED TO PRACTICE SOCIAL DISTANCING DUE TO THE COVID-19 PANDEMIC.

## 2021-09-03 NOTE — Patient Instructions (Signed)
Cooking With Less Salt Cooking with less salt is one way to reduce the amount of sodium you get from food. Sodium is one of the elements that make up salt. It is found naturally in foods and is also added to certain foods. Depending on your condition and overall health, your health care provider or dietitian may recommend that you reduce your sodium intake. Most people should have less than 2,300 milligrams (mg) of sodium each day. If you have high blood pressure (hypertension), you may need to limit your sodium to 1,500 mg each day. Follow the tipsbelow to help reduce your sodium intake. What are tips for eating less sodium? Reading food labels  Check the food label before buying or using packaged ingredients. Always check the label for the serving size and sodium content. Look for products with no more than 140 mg of sodium in one serving. Check the % Daily Value column to see what percent of the daily recommended amount of sodium is provided in one serving of the product. Foods with 5% or less in this column are considered low in sodium. Foods with 20% or higher are considered high in sodium. Do not choose foods with salt as one of the first three ingredients on the ingredients list. If salt is one of the first three ingredients, it usually means the item is high in sodium.  Shopping Buy sodium-free or low-sodium products. Look for the following words on food labels: Low-sodium. Sodium-free. Reduced-sodium. No salt added. Unsalted. Always check the sodium content even if foods are labeled as low-sodium or no salt added. Buy fresh foods. Cooking Use herbs, seasonings without salt, and spices as substitutes for salt. Use sodium-free baking soda when baking. Grill, braise, or roast foods to add flavor with less salt. Avoid adding salt to pasta, rice, or hot cereals. Drain and rinse canned vegetables, beans, and meat before use. Avoid adding salt when cooking sweets and desserts. Cook with  low-sodium ingredients. What foods are high in sodium? Vegetables Regular canned vegetables (not low-sodium or reduced-sodium). Sauerkraut, pickled vegetables, and relishes. Olives. French fries. Onion rings. Regular canned tomato sauce and paste. Regular tomato and vegetable juice. Frozenvegetables in sauces. Grains Instant hot cereals. Bread stuffing, pancake, and biscuit mixes. Croutons. Seasoned rice or pasta mixes. Noodle soup cups. Boxed or frozen macaroni and cheese. Regular salted crackers. Self-rising flour. Rolls. Bagels. Flourtortillas and wraps. Meats and other proteins Meat or fish that is salted, canned, smoked, cured, spiced, or pickled. This includes bacon, ham, sausages, hot dogs, corned beef, chipped beef, meat loaves, salt pork, jerky, pickled herring, anchovies, regular canned tuna, andsardines. Salted nuts. Dairy Processed cheese and cheese spreads. Cheese curds. Blue cheese. Feta cheese.String cheese. Regular cottage cheese. Buttermilk. Canned milk. The items listed above may not be a complete list of foods high in sodium. Actual amounts of sodium may be different depending on processing. Contact a dietitian for more information. What foods are low in sodium? Fruits Fresh, frozen, or canned fruit with no sauce added. Fruit juice. Vegetables Fresh or frozen vegetables with no sauce added. "No salt added" canned vegetables. "No salt added" tomato sauce and paste. Low-sodium orreduced-sodium tomato and vegetable juice. Grains Noodles, pasta, quinoa, rice. Shredded or puffed wheat or puffed rice. Regular or quick oats (not instant). Low-sodium crackers. Low-sodium bread. Whole-grainbread and whole-grain pasta. Unsalted popcorn. Meats and other proteins Fresh or frozen whole meats, poultry (not injected with sodium), and fish with no sauce added. Unsalted nuts. Dried peas, beans, and   lentils without added salt. Unsalted canned beans. Eggs. Unsalted nut butters. Low-sodium canned  tunaor chicken. Dairy Milk. Soy milk. Yogurt. Low-sodium cheeses, such as Swiss, Monterey Jack, mozzarella, and ricotta. Sherbet or ice cream (keep to  cup per serving).Cream cheese. Fats and oils Unsalted butter or margarine. Other foods Homemade pudding. Sodium-free baking soda and baking powder. Herbs and spices.Low-sodium seasoning mixes. Beverages Coffee and tea. Carbonated beverages. The items listed above may not be a complete list of foods low in sodium. Actual amounts of sodium may be different depending on processing. Contact a dietitian for more information. What are some salt alternatives when cooking? The following are herbs, seasonings, and spices that can be used instead of salt to flavor your food. Herbs should be fresh or dried. Do not choose packaged mixes. Next to the name of the herb, spice, or seasoning aresome examples of foods you can pair it with. Herbs [place] leaves - Soups, meat and vegetable dishes, and spaghetti sauce. Basil - Italian dishes, soups, pasta, and fish dishes. Cilantro - Meat, poultry, and vegetable dishes. Chili powder - Marinades and Mexican dishes. Chives - Salad dressings and potato dishes. Cumin - Mexican dishes, couscous, and meat dishes. Dill - Fish dishes, sauces, and salads. Fennel - Meat and vegetable dishes, breads, and cookies. Garlic (do not use garlic salt) - Italian dishes, meat dishes, salad dressings, and sauces. Marjoram - Soups, potato dishes, and meat dishes. Oregano - Pizza and spaghetti sauce. Parsley - Salads, soups, pasta, and meat dishes. Rosemary - Italian dishes, salad dressings, soups, and red meats. Saffron - Fish dishes, pasta, and some poultry dishes. Sage - Stuffings and sauces. Tarragon - Fish and poultry dishes. Thyme - Stuffing, meat, and fish dishes. Seasonings Lemon juice - Fish dishes, poultry dishes, vegetables, and salads. Vinegar - Salad dressings, vegetables, and fish dishes. Spices Cinnamon - Sweet  dishes, such as cakes, cookies, and puddings. Cloves - Gingerbread, puddings, and marinades for meats. Curry - Vegetable dishes, fish and poultry dishes, and stir-fry dishes. Ginger - Vegetable dishes, fish dishes, and stir-fry dishes. Nutmeg - Pasta, vegetables, poultry, fish dishes, and custard. Summary Cooking with less salt is one way to reduce the amount of sodium that you get from food. Buy sodium-free or low-sodium products. Check the food label before using or buying packaged ingredients. Use herbs, seasonings without salt, and spices as substitutes for salt in foods. This information is not intended to replace advice given to you by your health care provider. Make sure you discuss any questions you have with your healthcare provider. Document Revised: 09/21/2019 Document Reviewed: 09/21/2019 Elsevier Patient Education  2022 Elsevier Inc.  

## 2021-09-04 LAB — LIPID PANEL
Chol/HDL Ratio: 5.7 ratio — ABNORMAL HIGH (ref 0.0–4.4)
Cholesterol, Total: 222 mg/dL — ABNORMAL HIGH (ref 100–199)
HDL: 39 mg/dL — ABNORMAL LOW (ref >39)
LDL Chol Calc (NIH): 127 mg/dL — ABNORMAL HIGH (ref 0–99)
Triglycerides: 313 mg/dL — ABNORMAL HIGH (ref 0–149)
VLDL Cholesterol Cal: 56 mg/dL — ABNORMAL HIGH (ref 5–40)

## 2021-09-04 LAB — BMP8+EGFR
BUN/Creatinine Ratio: 10 — ABNORMAL LOW (ref 12–28)
BUN: 12 mg/dL (ref 8–27)
CO2: 25 mmol/L (ref 20–29)
Calcium: 10.1 mg/dL (ref 8.7–10.3)
Chloride: 102 mmol/L (ref 96–106)
Creatinine, Ser: 1.26 mg/dL — ABNORMAL HIGH (ref 0.57–1.00)
Glucose: 105 mg/dL — ABNORMAL HIGH (ref 70–99)
Potassium: 3.9 mmol/L (ref 3.5–5.2)
Sodium: 141 mmol/L (ref 134–144)
eGFR: 48 mL/min/{1.73_m2} — ABNORMAL LOW (ref >59)

## 2021-09-04 LAB — HEMOGLOBIN A1C
Est. average glucose Bld gHb Est-mCnc: 146 mg/dL
Hgb A1c MFr Bld: 6.7 % — ABNORMAL HIGH (ref 4.8–5.6)

## 2021-09-04 MED ORDER — DAPAGLIFLOZIN PROPANEDIOL 10 MG PO TABS: 10.0000 mg | ORAL_TABLET | Freq: Every day | ORAL | 2 refills | Status: DC

## 2021-09-09 ENCOUNTER — Other Ambulatory Visit: Payer: Self-pay

## 2021-09-09 NOTE — Progress Notes (Signed)
Okay, I will send a message to Tillie Rung to call her about transportation.

## 2021-09-24 ENCOUNTER — Telehealth: Payer: Self-pay | Admitting: *Deleted

## 2021-09-24 DIAGNOSIS — M47816 Spondylosis without myelopathy or radiculopathy, lumbar region: Secondary | ICD-10-CM

## 2021-09-24 MED ORDER — HYDROCODONE-ACETAMINOPHEN 7.5-325 MG PO TABS: 1.0000 | ORAL_TABLET | Freq: Three times a day (TID) | ORAL | 0 refills | Status: DC | PRN

## 2021-09-24 NOTE — Telephone Encounter (Signed)
Brittany Crawford called and says [facility] does not have an Rx for her December Hydrocodone. I have verified they do not have the Rx and they do have the medication on hand. Please send an new Rx.

## 2021-09-24 NOTE — Telephone Encounter (Signed)
PMP was Reviewed.  Hydrocodone was e-scribed today.  Sybil RN called Ms. Lagrand to informed her of the above. She verbalizes understanding.

## 2021-10-22 ENCOUNTER — Other Ambulatory Visit: Payer: Self-pay

## 2021-10-22 ENCOUNTER — Encounter: Payer: Self-pay | Admitting: Registered Nurse

## 2021-10-22 ENCOUNTER — Encounter: Payer: Commercial Managed Care - HMO | Attending: Registered Nurse | Admitting: Registered Nurse

## 2021-10-22 VITALS — BP 121/71 | HR 79 | Temp 99.0°F | Ht 68.0 in | Wt 201.0 lb

## 2021-10-22 DIAGNOSIS — M2550 Pain in unspecified joint: Secondary | ICD-10-CM | POA: Insufficient documentation

## 2021-10-22 DIAGNOSIS — Z5181 Encounter for therapeutic drug level monitoring: Secondary | ICD-10-CM | POA: Diagnosis not present

## 2021-10-22 DIAGNOSIS — G894 Chronic pain syndrome: Secondary | ICD-10-CM | POA: Diagnosis not present

## 2021-10-22 DIAGNOSIS — Z79891 Long term (current) use of opiate analgesic: Secondary | ICD-10-CM | POA: Diagnosis not present

## 2021-10-22 DIAGNOSIS — M47816 Spondylosis without myelopathy or radiculopathy, lumbar region: Secondary | ICD-10-CM | POA: Insufficient documentation

## 2021-10-22 DIAGNOSIS — M7061 Trochanteric bursitis, right hip: Secondary | ICD-10-CM | POA: Insufficient documentation

## 2021-10-22 DIAGNOSIS — M7062 Trochanteric bursitis, left hip: Secondary | ICD-10-CM | POA: Diagnosis not present

## 2021-10-22 MED ORDER — HYDROCODONE-ACETAMINOPHEN 7.5-325 MG PO TABS: 1.0000 | ORAL_TABLET | Freq: Three times a day (TID) | ORAL | 0 refills | Status: DC | PRN

## 2021-10-22 NOTE — Progress Notes (Signed)
Subjective:    Patient ID: Brittany Crawford, female    DOB: Dec 31, 1955, 66 y.o.   MRN: [id number]  HPI: Brittany Crawford is a 66 y.o. female who returns for follow up appointment for chronic pain and medication refill. She states her pain is located in her lower back, bilateral hips and generalized joint pain. She rates her pain 3. Her current exercise regime is walking and performing stretching exercises.  Ms. Dix Morphine equivalent is 22.50 MME.   Last UDS was Performed on 08/13/2021, it was consistent.      Pain Inventory Average Pain 8 Pain Right Now 3 My pain is constant, sharp, dull, and aching  In the last 24 hours, has pain interfered with the following? General activity 8 Relation with others 9 Enjoyment of life 10 What TIME of day is your pain at its worst? morning  and evening Sleep (in general) Fair  Pain is worse with: walking, bending, standing, and some activites Pain improves with: rest, heat/ice, therapy/exercise, and medication Relief from Meds:  GOOD  Family History  Problem Relation Age of Onset   Stroke Mother    Colon cancer Maternal Uncle    Breast cancer Neg Hx    Social History   Socioeconomic History   Marital status: Married    Spouse name: Not on file   Number of children: 2   Years of education: 12   Highest education level: Not on file  Occupational History   Occupation: disability  Tobacco Use   Smoking status: Former    Packs/day: 0.00    Years: 0.00    Pack years: 0.00    Types: Cigarettes    Quit date: 03/13/2017    Years since quitting: 4.6   Smokeless tobacco: Never   Tobacco comments:    4  to 5  cigarettes daily for years  Vaping Use   Vaping Use: Never used  Substance and Sexual Activity   Alcohol use: Not Currently    Comment: occassionally   Drug use: Yes    Types: Hydrocodone   Sexual activity: Not Currently  Other Topics Concern   Not on file  Social History Narrative   Patient is married with 2  children.   Patient is right handed.   Patient has hs education.   Patient drinks 4 cups daily.   Social Determinants of Health   Financial Resource Strain: Low Risk    Difficulty of Paying Living Expenses: Not hard at all  Food Insecurity: No Food Insecurity   Worried About Charity fundraiser in the Last Year: Never true   [place] in the Last Year: Never true  Transportation Needs: No Transportation Needs   Lack of Transportation (Medical): No   Lack of Transportation (Non-Medical): No  Physical Activity: Insufficiently Active   Days of Exercise per Week: 2 days   Minutes of Exercise per Session: 10 min  Stress: No Stress Concern Present   Feeling of Stress : Not at all  Social Connections: Not on file   Past Surgical History:  Procedure Laterality Date   BREAST BIOPSY Left 04/29/2016   BREAST BIOPSY Left 04/23/2016   DILATATION & CURRETTAGE/HYSTEROSCOPY WITH RESECTOCOPE N/A 12/10/2012   Procedure: [place];  Surgeon: Marvene Staff, MD;  Location: [facility];  Service: Gynecology;  Laterality: N/A;   FOOT SURGERY     left-pins placed   LYMPH NODE BIOPSY     POLYPECTOMY N/A 12/10/2012  Procedure: POLYPECTOMY;  Surgeon: Marvene Staff, MD;  Location: [facility];  Service: Gynecology;  Laterality: N/A;   STRABISMUS SURGERY Left 04/17/2017   Procedure: REPAIR STRABISMUS LEFT EYE;  Surgeon: Everitt Amber, MD;  Location: [facility];  Service: Ophthalmology;  Laterality: Left;   Past Surgical History:  Procedure Laterality Date   BREAST BIOPSY Left 04/29/2016   BREAST BIOPSY Left 04/23/2016   DILATATION & CURRETTAGE/HYSTEROSCOPY WITH RESECTOCOPE N/A 12/10/2012   Procedure: DILATATION & CURETTAGE/HYSTEROSCOPY WITH RESECTOCOPE;  Surgeon: Marvene Staff, MD;  Location: [facility];  Service: Gynecology;  Laterality: N/A;   FOOT SURGERY     left-pins placed   LYMPH NODE BIOPSY     POLYPECTOMY N/A 12/10/2012    Procedure: POLYPECTOMY;  Surgeon: Marvene Staff, MD;  Location: [facility];  Service: Gynecology;  Laterality: N/A;   STRABISMUS SURGERY Left 04/17/2017   Procedure: REPAIR STRABISMUS LEFT EYE;  Surgeon: Everitt Amber, MD;  Location: [facility];  Service: Ophthalmology;  Laterality: Left;   Past Medical History:  Diagnosis Date   Chronic back pain    H/O sarcoidosis    Hypertension    Seasonal allergies    Stroke (HCC)    BP 121/71    Pulse 79    Temp 99 F (37.2 C)    Ht 5\' 8"  (1.727 m)    Wt 201 lb (91.2 kg)    LMP 11/13/2012    SpO2 95%    BMI 30.56 kg/m   Opioid Risk Score:   Fall Risk Score:  `1  Depression screen PHQ 2/9  Depression screen [facility] 2/9 08/13/2021 07/24/2021 04/12/2021 08/30/2020 08/23/2020 06/26/2020 06/22/2020  Decreased Interest 0 0 0 0 0 0 -  Down, Depressed, Hopeless 0 0 0 0 0 0 0  PHQ - 2 Score 0 0 0 0 0 0 0  Altered sleeping - - - - - - 0  Tired, decreased energy - - - - - - 0  Change in appetite - - - - - - 0  Feeling bad or failure about yourself  - - - - - - 0  Trouble concentrating - - - - - - 0  Moving slowly or fidgety/restless - - - - - - 0  Suicidal thoughts - - - - - - 0  PHQ-9 Score - - - - - - 0  Some recent data might be hidden    Review of Systems  Musculoskeletal:  Positive for back pain.       LEFT HIP PAIN  All other systems reviewed and are negative.     Objective:   Physical Exam Vitals and nursing note reviewed.  Constitutional:      Appearance: Normal appearance.  Cardiovascular:     Rate and Rhythm: Normal rate and regular rhythm.     Pulses: Normal pulses.     Heart sounds: Normal heart sounds.  Pulmonary:     Effort: Pulmonary effort is normal.     Breath sounds: Normal breath sounds.  Musculoskeletal:     Cervical back: Normal range of motion and neck supple.     Comments: Normal Muscle Bulk and Muscle Testing Reveals:  Upper Extremities:Full  ROM and Muscle Strength 5/5 Lumbar Paraspinal Tenderness:  L-3-L-5 Bilateral Greater Trochanter Tenderness Lower Extremities: Full ROM and Muscle Strength 5/5  Arises from Table with ease Narrow Based Gait     Skin:    General: Skin is warm and dry.  Neurological:  Mental Status: She is alert and oriented to person, place, and time.  Psychiatric:        Mood and Affect: Mood normal.        Behavior: Behavior normal.         Assessment & Plan:  1. Lumbar spondylosis with DDD and facet arthropathy.Left Lumbar Radiculitis: Continue HEP as Tolerated. Continue to Monitor.  10/22/2021 Refilled: Hydrocodone 7.5 /325 mg one tablet every 8 hours as needed for pain. Increased to  #90 tablets. . Second script e-scribe for the following month. We will continue the opioid monitoring program, this consists of regular clinic visits, examinations, urine drug screen, pill counts as well as use of [place] Controlled Substance Reporting system. A 12 month History has been reviewed on the [place] on 10/22/2021. 2. Bilateral knee pain: No complaints voiced today: Continue with heat/ice, exercise and Diclofenac. 10/22/2021 3. Right thalamic/internal capsule lacunar infarct with persistent left hemisensory deficits: Neurology Following. Continue to Monitor. 10/22/2021. 4.Bilateral Greater Trochanteric Bursitis: Continue HEP as Tolerated and Alternate Ice and Heat Therapy.  Continue current medication regime. Continue to Monitor.  10/22/2021. 5. Polyarthralgia: Continue HEP as Tolerated. Continue to monitor. 10/22/2021    F/U in 2 months

## 2021-10-23 ENCOUNTER — Ambulatory Visit: Payer: Medicare Other | Admitting: Nurse Practitioner

## 2021-10-30 ENCOUNTER — Other Ambulatory Visit: Payer: Self-pay

## 2021-10-30 MED ORDER — LOSARTAN POTASSIUM-HCTZ 100-12.5 MG PO TABS: 1.0000 | ORAL_TABLET | Freq: Every day | ORAL | 1 refills | Status: DC

## 2021-12-05 ENCOUNTER — Ambulatory Visit: Payer: Medicare Other | Admitting: Nurse Practitioner

## 2021-12-09 ENCOUNTER — Other Ambulatory Visit: Payer: Self-pay | Admitting: Nurse Practitioner

## 2021-12-09 DIAGNOSIS — E1122 Type 2 diabetes mellitus with diabetic chronic kidney disease: Secondary | ICD-10-CM

## 2021-12-17 ENCOUNTER — Encounter: Payer: Medicare Other | Attending: Registered Nurse | Admitting: Registered Nurse

## 2021-12-17 ENCOUNTER — Other Ambulatory Visit: Payer: Self-pay

## 2021-12-17 ENCOUNTER — Encounter: Payer: Self-pay | Admitting: Registered Nurse

## 2021-12-17 VITALS — BP 135/85 | HR 72 | Temp 99.4°F | Ht 68.0 in | Wt 201.8 lb

## 2021-12-17 DIAGNOSIS — Z79891 Long term (current) use of opiate analgesic: Secondary | ICD-10-CM | POA: Diagnosis not present

## 2021-12-17 DIAGNOSIS — Z5181 Encounter for therapeutic drug level monitoring: Secondary | ICD-10-CM | POA: Diagnosis not present

## 2021-12-17 DIAGNOSIS — M47816 Spondylosis without myelopathy or radiculopathy, lumbar region: Secondary | ICD-10-CM | POA: Diagnosis not present

## 2021-12-17 DIAGNOSIS — G894 Chronic pain syndrome: Secondary | ICD-10-CM | POA: Insufficient documentation

## 2021-12-17 DIAGNOSIS — M7062 Trochanteric bursitis, left hip: Secondary | ICD-10-CM | POA: Diagnosis not present

## 2021-12-17 DIAGNOSIS — M546 Pain in thoracic spine: Secondary | ICD-10-CM | POA: Diagnosis not present

## 2021-12-17 MED ORDER — HYDROCODONE-ACETAMINOPHEN 7.5-325 MG PO TABS: 1.0000 | ORAL_TABLET | Freq: Three times a day (TID) | ORAL | 0 refills | Status: DC | PRN

## 2021-12-17 NOTE — Progress Notes (Signed)
? ?Subjective:  ? ? Patient ID: Brittany Crawford, female    DOB: 1956/10/05, 66 y.o.   MRN: [id number] ? ?HPI: Brittany Crawford is a 66 y.o. female who returns for follow up appointment for chronic pain and medication refill. She states her pain is located in her lower back  and left hip pain. She also reports left eye pain, she has a scheduled appointment with her ophthalmologist she states. She rates her pain 9. Her current exercise regime is walking and performing stretching exercises. ? ?Brittany Crawford Morphine equivalent is 22.50 MME.   UDS ordered today.  ?  ? ?Pain Inventory ?Average Pain 7 ?Pain Right Now 9 ?My pain is burning, dull, and aching ? ?In the last 24 hours, has pain interfered with the following? ?General activity 7 ?Relation with others 10 ?Enjoyment of life 9 ?What TIME of day is your pain at its worst? morning  and evening ?Sleep (in general) Fair ? ?Pain is worse with: walking, bending, standing, and some activites ?Pain improves with: rest, heat/ice, therapy/exercise, and medication ?Relief from Meds: 8 ? ?Family History  ?Problem Relation Age of Onset  ? Stroke Mother   ? Colon cancer Maternal Uncle   ? Breast cancer Neg Hx   ? ?Social History  ? ?Socioeconomic History  ? Marital status: Married  ?  Spouse name: Not on file  ? Number of children: 2  ? Years of education: 5  ? Highest education level: Not on file  ?Occupational History  ? Occupation: disability  ?Tobacco Use  ? Smoking status: Former  ?  Packs/day: 0.00  ?  Years: 0.00  ?  Pack years: 0.00  ?  Types: Cigarettes  ?  Quit date: 03/13/2017  ?  Years since quitting: 4.7  ? Smokeless tobacco: Never  ? Tobacco comments:  ?  4  to 5  cigarettes daily for years  ?Vaping Use  ? Vaping Use: Never used  ?Substance and Sexual Activity  ? Alcohol use: Not Currently  ?  Comment: occassionally  ? Drug use: Yes  ?  Types: Hydrocodone  ? Sexual activity: Not Currently  ?Other Topics Concern  ? Not on file  ?Social History Narrative  ? Patient  is married with 2 children.  ? Patient is right handed.  ? Patient has hs education.  ? Patient drinks 4 cups daily.  ? ?Social Determinants of Health  ? ?Financial Resource Strain: Low Risk   ? Difficulty of Paying Living Expenses: Not hard at all  ?Food Insecurity: No Food Insecurity  ? Worried About Charity fundraiser in the Last Year: Never true  ? Ran Out of Food in the Last Year: Never true  ?Transportation Needs: No Transportation Needs  ? Lack of Transportation (Medical): No  ? Lack of Transportation (Non-Medical): No  ?Physical Activity: Insufficiently Active  ? Days of Exercise per Week: 2 days  ? Minutes of Exercise per Session: 10 min  ?Stress: No Stress Concern Present  ? Feeling of Stress : Not at all  ?Social Connections: Not on file  ? ?Past Surgical History:  ?Procedure Laterality Date  ? BREAST BIOPSY Left 04/29/2016  ? BREAST BIOPSY Left 04/23/2016  ? DILATATION & CURRETTAGE/HYSTEROSCOPY WITH RESECTOCOPE N/A 12/10/2012  ? Procedure: [place];  Surgeon: Marvene Staff, MD;  Location: [facility];  Service: Gynecology;  Laterality: N/A;  ? FOOT SURGERY    ? left-pins placed  ? LYMPH NODE BIOPSY    ?  POLYPECTOMY N/A 12/10/2012  ? Procedure: POLYPECTOMY;  Surgeon: Marvene Staff, MD;  Location: [facility];  Service: Gynecology;  Laterality: N/A;  ? STRABISMUS SURGERY Left 04/17/2017  ? Procedure: REPAIR STRABISMUS LEFT EYE;  Surgeon: Everitt Amber, MD;  Location: [facility];  Service: Ophthalmology;  Laterality: Left;  ? ?Past Surgical History:  ?Procedure Laterality Date  ? BREAST BIOPSY Left 04/29/2016  ? BREAST BIOPSY Left 04/23/2016  ? DILATATION & CURRETTAGE/HYSTEROSCOPY WITH RESECTOCOPE N/A 12/10/2012  ? Procedure: [place];  Surgeon: Marvene Staff, MD;  Location: [facility];  Service: Gynecology;  Laterality: N/A;  ? FOOT SURGERY    ? left-pins placed  ? LYMPH NODE BIOPSY    ? POLYPECTOMY  N/A 12/10/2012  ? Procedure: POLYPECTOMY;  Surgeon: Marvene Staff, MD;  Location: [facility];  Service: Gynecology;  Laterality: N/A;  ? STRABISMUS SURGERY Left 04/17/2017  ? Procedure: REPAIR STRABISMUS LEFT EYE;  Surgeon: Everitt Amber, MD;  Location: [facility];  Service: Ophthalmology;  Laterality: Left;  ? ?Past Medical History:  ?Diagnosis Date  ? Chronic back pain   ? H/O sarcoidosis   ? Hypertension   ? Seasonal allergies   ? Stroke [facility])   ? ?BP 135/85   Pulse 72   Temp 99.4 ?F (37.4 ?C)   Ht '5\' 8"'$  (1.727 m)   Wt 201 lb 12.8 oz (91.5 kg)   LMP 11/13/2012   SpO2 95%   BMI 30.68 kg/m?  ? ?Opioid Risk Score:   ?Fall Risk Score:  `1 ? ?Depression screen PHQ 2/9 ? ?Depression screen [facility] 2/9 12/17/2021 10/22/2021 08/13/2021 07/24/2021 04/12/2021 08/30/2020 08/23/2020  ?Decreased Interest 0 0 0 0 0 0 0  ?Down, Depressed, Hopeless 0 0 0 0 0 0 0  ?PHQ - 2 Score 0 0 0 0 0 0 0  ?Altered sleeping - - - - - - -  ?Tired, decreased energy - - - - - - -  ?Change in appetite - - - - - - -  ?Feeling bad or failure about yourself  - - - - - - -  ?Trouble concentrating - - - - - - -  ?Moving slowly or fidgety/restless - - - - - - -  ?Suicidal thoughts - - - - - - -  ?PHQ-9 Score - - - - - - -  ?Some recent data might be hidden  ?  ? ?Review of Systems  ?Constitutional: Negative.   ?HENT: Negative.    ?Eyes: Negative.   ?Respiratory: Negative.    ?Cardiovascular: Negative.   ?Gastrointestinal: Negative.   ?Endocrine: Negative.   ?Genitourinary: Negative.   ?Musculoskeletal:  Positive for back pain.  ?Skin: Negative.   ?Allergic/Immunologic: Negative.   ?Neurological: Negative.   ?Hematological: Negative.   ?Psychiatric/Behavioral: Negative.    ?All other systems reviewed and are negative. ? ?   ?Objective:  ? Physical Exam ?Vitals and nursing note reviewed.  ?Constitutional:   ?   Appearance: Normal appearance.  ?Cardiovascular:  ?   Rate and Rhythm: Normal rate and regular rhythm.  ?   Pulses: Normal pulses.   ?   Heart sounds: Normal heart sounds.  ?Pulmonary:  ?   Effort: Pulmonary effort is normal.  ?   Breath sounds: Normal breath sounds.  ?Musculoskeletal:  ?   Cervical back: Normal range of motion and neck supple.  ?   Comments: Normal Muscle Bulk and Muscle Testing Reveals:  ?Upper Extremities: Full ROM and  Muscle Strength 5/5 ?Lumbar Paraspinal Tenderness: L-4-L-5 ?Left Greater Trochanter Tenderness ?Lower Extremities: Full ROM and Muscle Strength 5/5 ?Arises from Table with ease ?Narrow Based  Gait  ?   ?Skin: ?   General: Skin is warm and dry.  ?Neurological:  ?   Mental Status: She is alert and oriented to person, place, and time.  ?Psychiatric:     ?   Mood and Affect: Mood normal.     ?   Behavior: Behavior normal.  ? ? ? ? ?   ?Assessment & Plan:  ?1. Lumbar spondylosis with DDD and facet arthropathy.Left Lumbar Radiculitis: Continue HEP as Tolerated. Continue to Monitor.  12/17/2021 ?Refilled: Hydrocodone 7.5 /325 mg one tablet every 8 hours as needed for pain. Increased to  #90 tablets. . Second script e-scribe for the following month. ?We will continue the opioid monitoring program, this consists of regular clinic visits, examinations, urine drug screen, pill counts as well as use of [place] Controlled Substance Reporting system. A 12 month History has been reviewed on the [place] Controlled Substance Reporting System on 12/17/2021. ?2. Bilateral knee pain: No complaints voiced today: Continue with heat/ice, exercise and Diclofenac. 12/17/2021 ?3. Right thalamic/internal capsule lacunar infarct with persistent left hemisensory deficits: Neurology Following. Continue to Monitor. 12/17/2021. ?4.Left Greater Trochanteric Bursitis: Continue HEP as Tolerated and Alternate Ice and Heat Therapy.  Continue current medication regime. Continue to Monitor.  12/17/2021. ?5. Polyarthralgia: Continue HEP as Tolerated. Continue to monitor. 12/17/2021 ?   ?F/U in 2 months ?  ? ?

## 2021-12-20 LAB — TOXASSURE SELECT,+ANTIDEPR,UR

## 2021-12-23 ENCOUNTER — Telehealth: Payer: Self-pay | Admitting: *Deleted

## 2021-12-23 NOTE — Telephone Encounter (Signed)
Urine drug screen for this encounter is consistent for prescribed medication 

## 2021-12-24 DIAGNOSIS — H4422 Degenerative myopia, left eye: Secondary | ICD-10-CM | POA: Diagnosis not present

## 2021-12-24 DIAGNOSIS — H30142 Acute posterior multifocal placoid pigment epitheliopathy, left eye: Secondary | ICD-10-CM | POA: Diagnosis not present

## 2021-12-24 DIAGNOSIS — H40023 Open angle with borderline findings, high risk, bilateral: Secondary | ICD-10-CM | POA: Diagnosis not present

## 2021-12-24 DIAGNOSIS — H5211 Myopia, right eye: Secondary | ICD-10-CM | POA: Diagnosis not present

## 2021-12-24 DIAGNOSIS — H25813 Combined forms of age-related cataract, bilateral: Secondary | ICD-10-CM | POA: Diagnosis not present

## 2022-01-08 IMAGING — DX DG RIBS W/ CHEST 3+V*L*
5 series · 5 of 5 positions shown · non-contrast
Comparison: 07/06/2014

CLINICAL DATA: Left pain

EXAM:
LEFT RIBS AND CHEST - 3+ VIEW

[dg ribs unilateral w/chest left (1 of 5)]
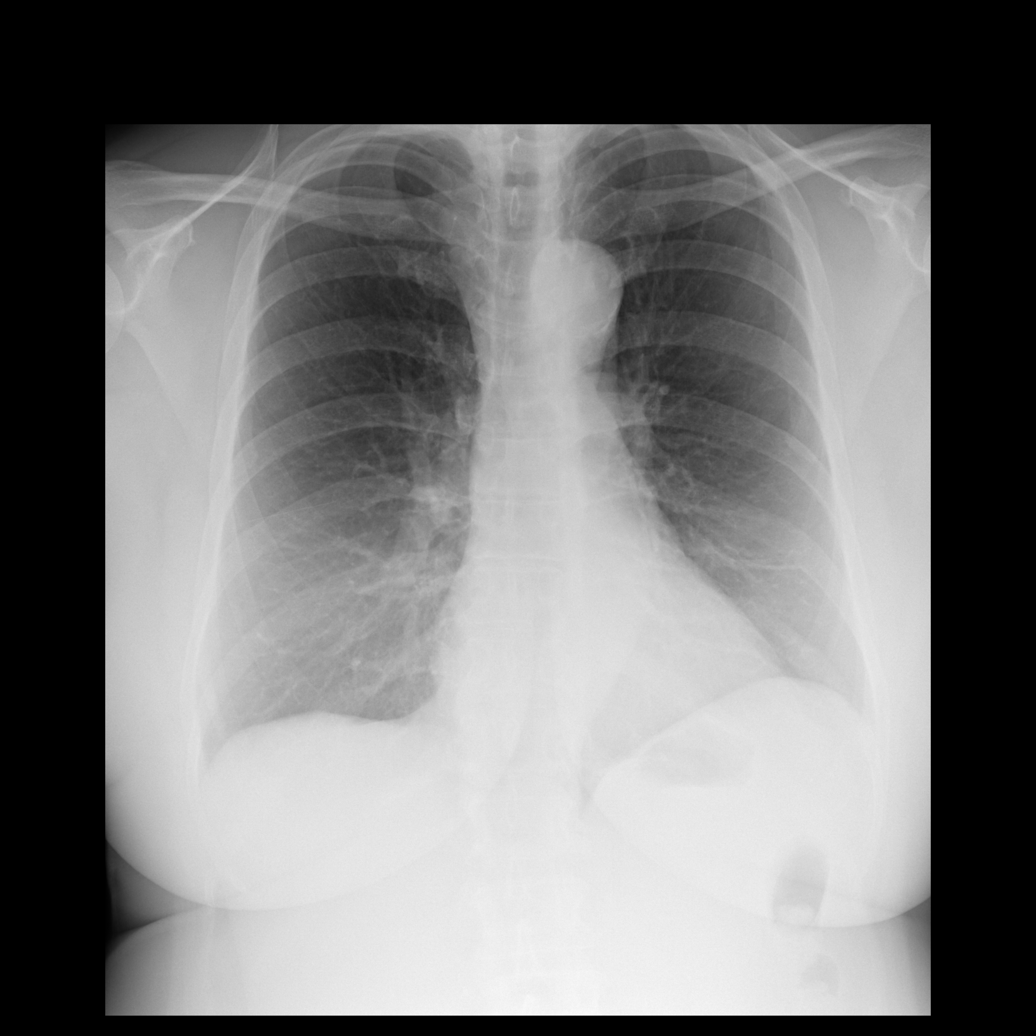

[dg ribs unilateral w/chest left (2 of 5)]
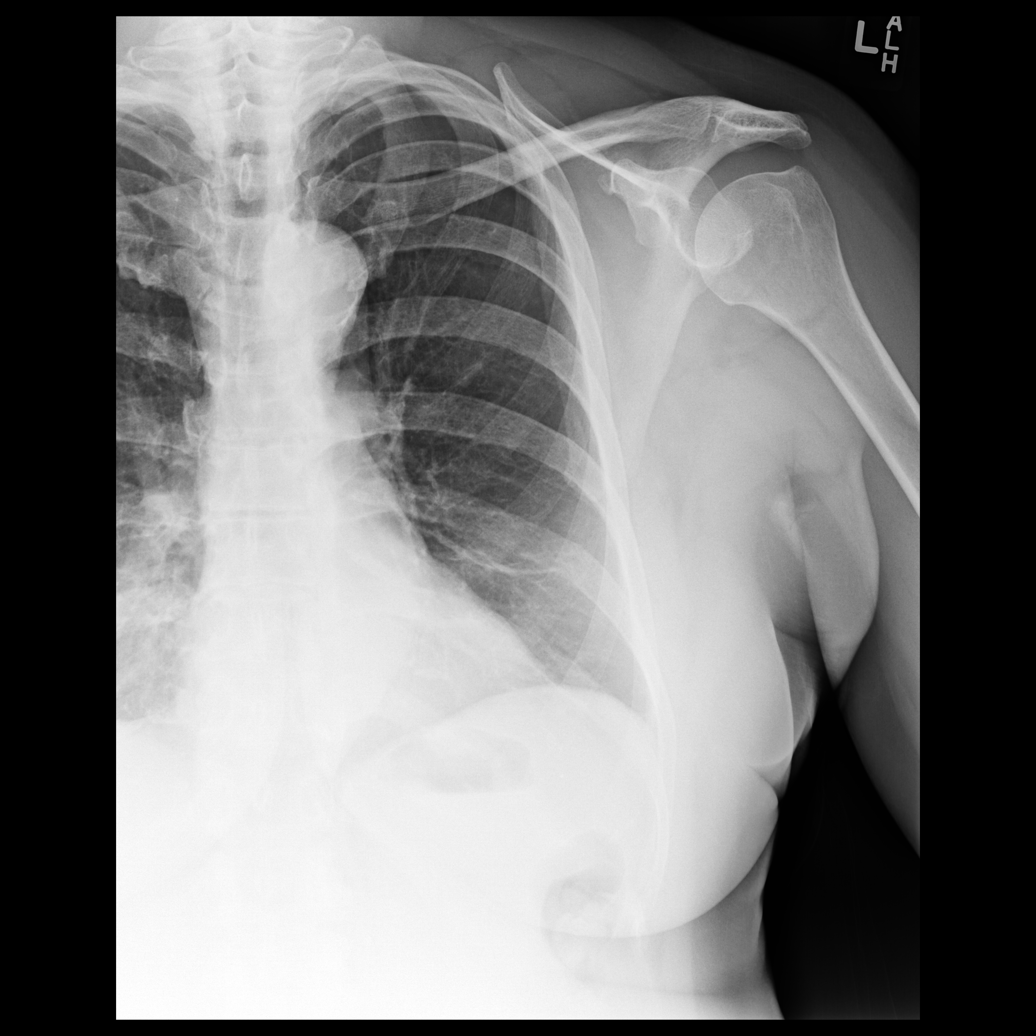

[dg ribs unilateral w/chest left (3 of 5)]
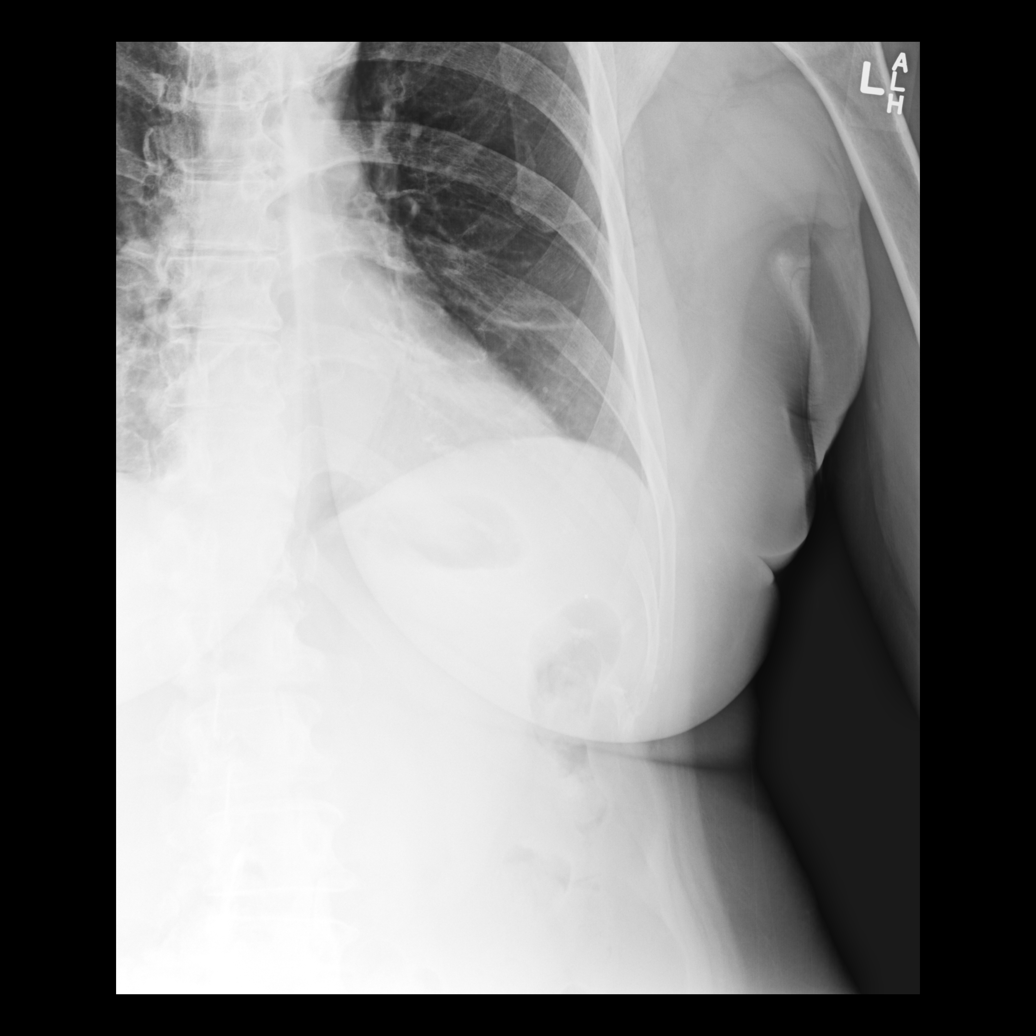

[dg ribs unilateral w/chest left (4 of 5)]
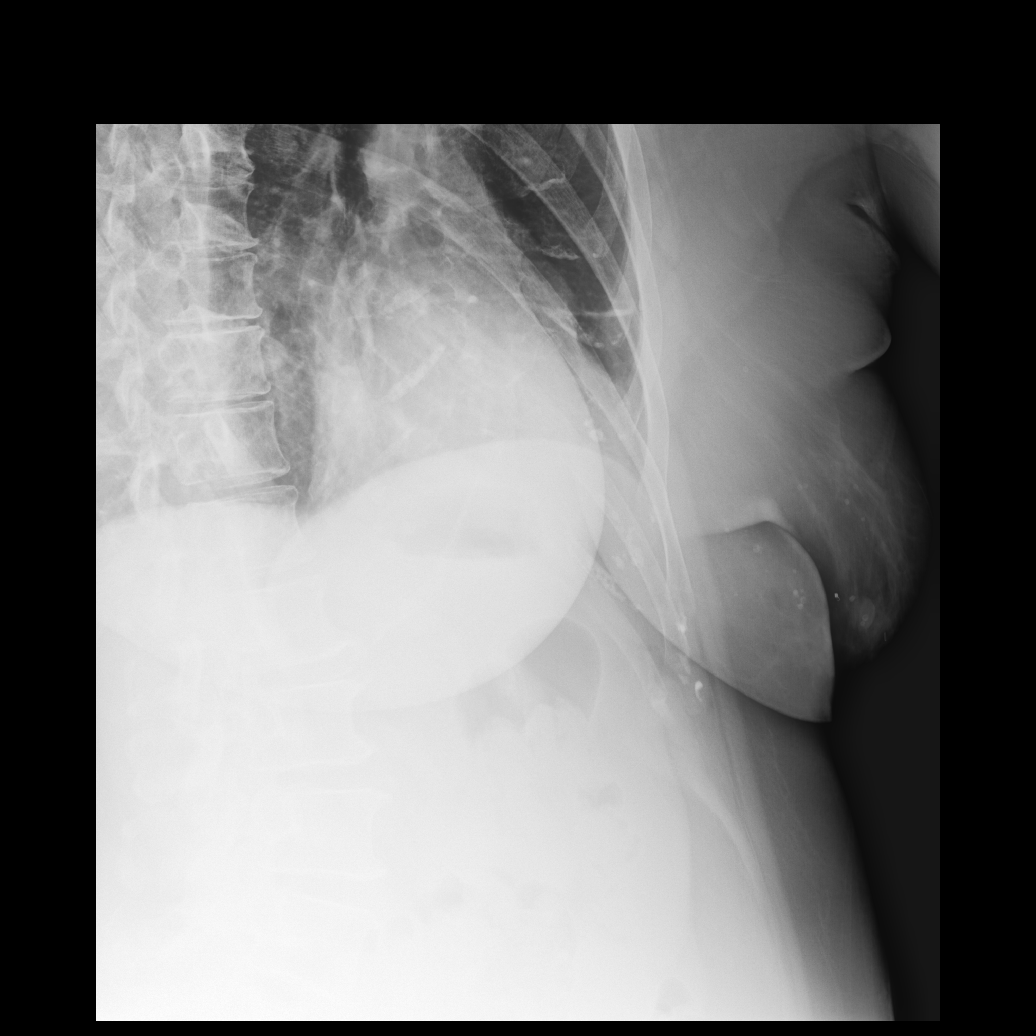

[dg ribs unilateral w/chest left (5 of 5)]
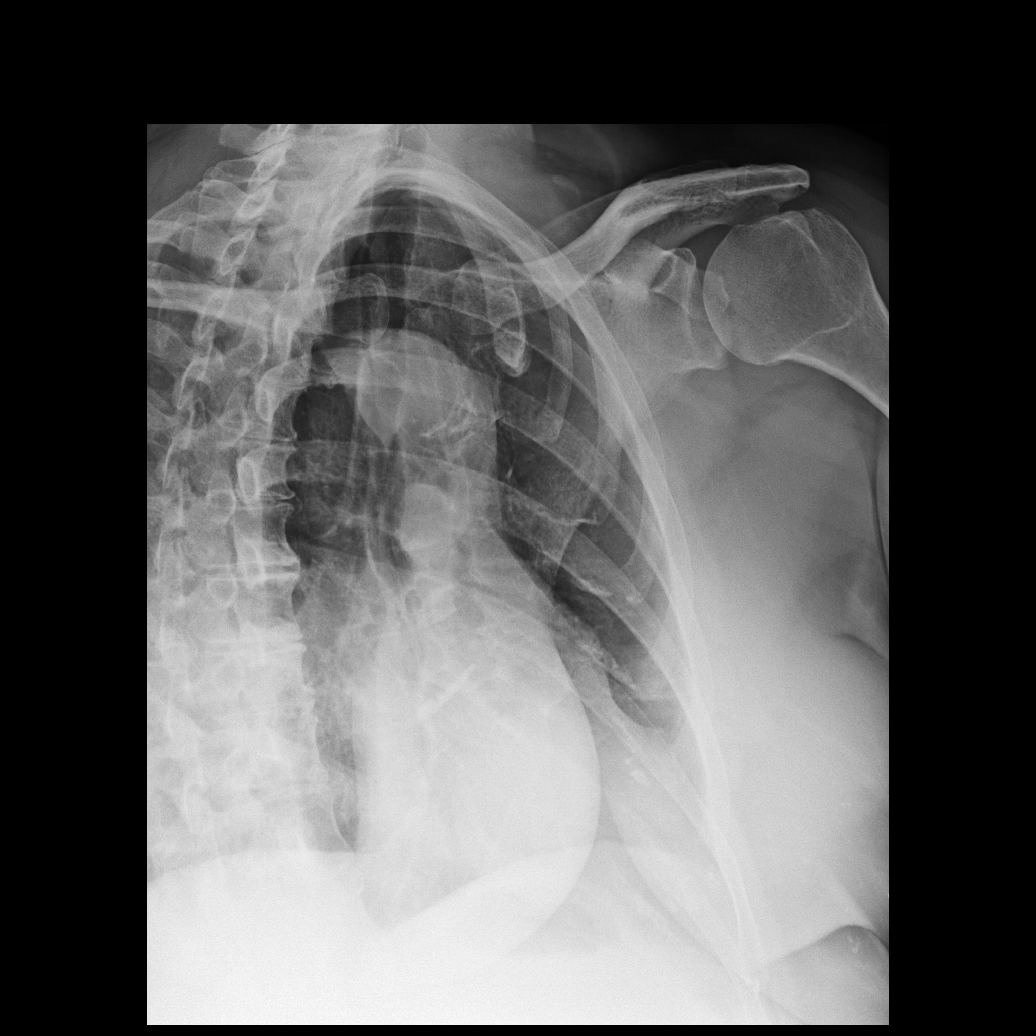

[5 of 5 positions shown; findings below may reference images not displayed]

FINDINGS: No fracture or other bone lesions are seen involving the ribs. There
is no evidence of pneumothorax or pleural effusion. Stable linear
scarring or subsegmental atelectasis in the left lower lung. Heart
size and mediastinal contours are within normal limits.
IMPRESSION: Negative.

## 2022-02-05 DIAGNOSIS — H40023 Open angle with borderline findings, high risk, bilateral: Secondary | ICD-10-CM | POA: Diagnosis not present

## 2022-02-05 DIAGNOSIS — H30142 Acute posterior multifocal placoid pigment epitheliopathy, left eye: Secondary | ICD-10-CM | POA: Diagnosis not present

## 2022-02-05 DIAGNOSIS — H4422 Degenerative myopia, left eye: Secondary | ICD-10-CM | POA: Diagnosis not present

## 2022-02-05 DIAGNOSIS — H25813 Combined forms of age-related cataract, bilateral: Secondary | ICD-10-CM | POA: Diagnosis not present

## 2022-02-11 ENCOUNTER — Encounter: Payer: Medicare Other | Attending: Registered Nurse | Admitting: Registered Nurse

## 2022-02-11 ENCOUNTER — Encounter: Payer: Self-pay | Admitting: Registered Nurse

## 2022-02-11 VITALS — BP 145/81 | HR 86 | Ht 68.0 in | Wt 202.0 lb

## 2022-02-11 DIAGNOSIS — G894 Chronic pain syndrome: Secondary | ICD-10-CM | POA: Diagnosis not present

## 2022-02-11 DIAGNOSIS — Z79891 Long term (current) use of opiate analgesic: Secondary | ICD-10-CM | POA: Diagnosis not present

## 2022-02-11 DIAGNOSIS — M47816 Spondylosis without myelopathy or radiculopathy, lumbar region: Secondary | ICD-10-CM | POA: Diagnosis not present

## 2022-02-11 DIAGNOSIS — Z5181 Encounter for therapeutic drug level monitoring: Secondary | ICD-10-CM | POA: Insufficient documentation

## 2022-02-11 MED ORDER — HYDROCODONE-ACETAMINOPHEN 7.5-325 MG PO TABS: 1.0000 | ORAL_TABLET | Freq: Three times a day (TID) | ORAL | 0 refills | Status: DC | PRN

## 2022-02-11 NOTE — Progress Notes (Signed)
? ?Subjective:  ? ? Patient ID: Brittany Crawford, female    DOB: [birth year], 66 y.o.   MRN: [id number] ? ?HPI: Brittany Crawford is a 66 y.o. female who returns for follow up appointment for chronic pain and medication refill. She states her pain is located in her lower back. She rates her pain 8. Her current exercise regime is walking and performing stretching exercises. ? ?Brittany Crawford Morphine equivalent is 22.50 MME.   Last UDS was Performed on 12/17/2021, it was consistent.  ?  ? ?Pain Inventory ?Average Pain 6 ?Pain Right Now 8 ?My pain is dull and aching ? ?In the last 24 hours, has pain interfered with the following? ?General activity 6 ?Relation with others 9 ?Enjoyment of life 10 ?What TIME of day is your pain at its worst? morning  and evening ?Sleep (in general) Fair ? ?Pain is worse with: bending and standing ?Pain improves with: rest, heat/ice, therapy/exercise, and medication ?Relief from Meds: 10 ? ?Family History  ?Problem Relation Age of Onset  ? Stroke Mother   ? Colon cancer Maternal Uncle   ? Breast cancer Neg Hx   ? ?Social History  ? ?Socioeconomic History  ? Marital status: Married  ?  Spouse name: Not on file  ? Number of children: 2  ? Years of education: 27  ? Highest education level: Not on file  ?Occupational History  ? Occupation: disability  ?Tobacco Use  ? Smoking status: Former  ?  Packs/day: 0.00  ?  Years: 0.00  ?  Pack years: 0.00  ?  Types: Cigarettes  ?  Quit date: 03/13/2017  ?  Years since quitting: 4.9  ? Smokeless tobacco: Never  ? Tobacco comments:  ?  4  to 5  cigarettes daily for years  ?Vaping Use  ? Vaping Use: Never used  ?Substance and Sexual Activity  ? Alcohol use: Not Currently  ?  Comment: occassionally  ? Drug use: Yes  ?  Types: Hydrocodone  ? Sexual activity: Not Currently  ?Other Topics Concern  ? Not on file  ?Social History Narrative  ? Patient is married with 2 children.  ? Patient is right handed.  ? Patient has hs education.  ? Patient drinks 4 cups daily.   ? ?Social Determinants of Health  ? ?Financial Resource Strain: Low Risk   ? Difficulty of Paying Living Expenses: Not hard at all  ?Food Insecurity: No Food Insecurity  ? Worried About Charity fundraiser in the Last Year: Never true  ? Ran Out of Food in the Last Year: Never true  ?Transportation Needs: No Transportation Needs  ? Lack of Transportation (Medical): No  ? Lack of Transportation (Non-Medical): No  ?Physical Activity: Insufficiently Active  ? Days of Exercise per Week: 2 days  ? Minutes of Exercise per Session: 10 min  ?Stress: No Stress Concern Present  ? Feeling of Stress : Not at all  ?Social Connections: Not on file  ? ?Past Surgical History:  ?Procedure Laterality Date  ? BREAST BIOPSY Left 04/29/2016  ? BREAST BIOPSY Left 04/23/2016  ? DILATATION & CURRETTAGE/HYSTEROSCOPY WITH RESECTOCOPE N/A 12/10/2012  ? Procedure: [place];  Surgeon: Marvene Staff, MD;  Location: [facility];  Service: Gynecology;  Laterality: N/A;  ? FOOT SURGERY    ? left-pins placed  ? LYMPH NODE BIOPSY    ? POLYPECTOMY N/A 12/10/2012  ? Procedure: POLYPECTOMY;  Surgeon: Marvene Staff, MD;  Location: [facility];  Service: Gynecology;  Laterality: N/A;  ? STRABISMUS SURGERY Left 04/17/2017  ? Procedure: REPAIR STRABISMUS LEFT EYE;  Surgeon: Everitt Amber, MD;  Location: [facility];  Service: Ophthalmology;  Laterality: Left;  ? ?Past Surgical History:  ?Procedure Laterality Date  ? BREAST BIOPSY Left 04/29/2016  ? BREAST BIOPSY Left 04/23/2016  ? DILATATION & CURRETTAGE/HYSTEROSCOPY WITH RESECTOCOPE N/A 12/10/2012  ? Procedure: [place];  Surgeon: Marvene Staff, MD;  Location: [facility];  Service: Gynecology;  Laterality: N/A;  ? FOOT SURGERY    ? left-pins placed  ? LYMPH NODE BIOPSY    ? POLYPECTOMY N/A 12/10/2012  ? Procedure: POLYPECTOMY;  Surgeon: Marvene Staff, MD;  Location: [facility];  Service: Gynecology;   Laterality: N/A;  ? STRABISMUS SURGERY Left 04/17/2017  ? Procedure: REPAIR STRABISMUS LEFT EYE;  Surgeon: Everitt Amber, MD;  Location: [facility];  Service: Ophthalmology;  Laterality: Left;  ? ?Past Medical History:  ?Diagnosis Date  ? Chronic back pain   ? H/O sarcoidosis   ? Hypertension   ? Seasonal allergies   ? Stroke [facility])   ? ?BP (!) 145/81   Pulse 86   Ht '5\' 8"'$  (1.727 m)   Wt 202 lb (91.6 kg)   LMP 11/13/2012   SpO2 95%   BMI 30.71 kg/m?  ? ?Opioid Risk Score:   ?Fall Risk Score:  `1 ? ?Depression screen PHQ 2/9 ? ? ?  12/17/2021  ? 10:35 AM 10/22/2021  ? 10:07 AM 08/13/2021  ? 10:56 AM 07/24/2021  ? 12:01 PM 04/12/2021  ? 11:04 AM 08/30/2020  ?  9:25 AM 08/23/2020  ? 10:07 AM  ?Depression screen PHQ 2/9  ?Decreased Interest 0 0 0 0 0 0 0  ?Down, Depressed, Hopeless 0 0 0 0 0 0 0  ?PHQ - 2 Score 0 0 0 0 0 0 0  ?  ? ?Review of Systems  ?Respiratory:  Positive for apnea.   ?Musculoskeletal:  Positive for back pain.  ?All other systems reviewed and are negative. ? ?   ?Objective:  ? Physical Exam ?Vitals and nursing note reviewed.  ?Constitutional:   ?   Appearance: Normal appearance.  ?Cardiovascular:  ?   Rate and Rhythm: Normal rate and regular rhythm.  ?   Pulses: Normal pulses.  ?   Heart sounds: Normal heart sounds.  ?Pulmonary:  ?   Effort: Pulmonary effort is normal.  ?   Breath sounds: Normal breath sounds.  ?Musculoskeletal:  ?   Cervical back: Normal range of motion and neck supple.  ?   Comments: Normal Muscle Bulk and Muscle Testing Reveals:  ?Upper Extremities: Full ROM and Muscle Strength 5/5 ? Lumbar Paraspinal Tenderness: L-3-L-5 ?Lower Extremities: Full ROM and Muscle Strength 5/5 ?Arises from Table with ease ?Narrow Based Gait  ?   ?Skin: ?   General: Skin is warm and dry.  ?Neurological:  ?   Mental Status: She is alert and oriented to person, place, and time.  ?Psychiatric:     ?   Mood and Affect: Mood normal.     ?   Behavior: Behavior normal.  ? ? ? ? ?   ?Assessment &  Plan:  ?1. Lumbar spondylosis with DDD and facet arthropathy.Left Lumbar Radiculitis: Continue HEP as Tolerated. Continue to Monitor.  02/11/2022 ?Refilled: Hydrocodone 7.5 /325 mg one tablet every 8 hours as needed for pain. Increased to  #90 tablets. . Second script e-scribe for the following  month. ?We will continue the opioid monitoring program, this consists of regular clinic visits, examinations, urine drug screen, pill counts as well as use of [place] Controlled Substance Reporting system. A 12 month History has been reviewed on the [place] Controlled Substance Reporting System on 02/11/2022. ?2. Bilateral knee pain: No complaints voiced today: Continue with heat/ice, exercise and Diclofenac. 02/11/2022 ?3. Right thalamic/internal capsule lacunar infarct with persistent left hemisensory deficits: Neurology Following. Continue to Monitor. 02/11/2022. ?4.Left Greater Trochanteric Bursitis: no complaints today. Continue HEP as Tolerated and Alternate Ice and Heat Therapy.  Continue current medication regime. Continue to Monitor.  02/11/2022. ?5. Polyarthralgia: Continue HEP as Tolerated. Continue to monitor. 02/11/2022 ?   ?F/U in 2 months ?  ? ? ? ? ? ? ? ?

## 2022-02-19 ENCOUNTER — Ambulatory Visit (HOSPITAL_BASED_OUTPATIENT_CLINIC_OR_DEPARTMENT_OTHER): Payer: Medicare Other | Admitting: Internal Medicine

## 2022-02-20 ENCOUNTER — Telehealth: Payer: Self-pay | Admitting: Internal Medicine

## 2022-02-20 NOTE — Telephone Encounter (Signed)
Called patient to r/s her appt with Dr. Debara Pickett, there was a virtual appointment available but patient declined scheduling. She asked that we call back next week when she knows her schedule and gets a new phone. Staff message sent to [place] and Clarnce Flock, RN made aware.  ?

## 2022-03-04 ENCOUNTER — Other Ambulatory Visit (HOSPITAL_COMMUNITY)
Admission: RE | Admit: 2022-03-04 | Discharge: 2022-03-04 | Disposition: A | Discharge status: Home / Self Care | Payer: Medicare Other | Source: Ambulatory Visit | Attending: Nurse Practitioner | Admitting: Nurse Practitioner

## 2022-03-04 ENCOUNTER — Encounter: Payer: Self-pay | Admitting: Nurse Practitioner

## 2022-03-04 ENCOUNTER — Ambulatory Visit (INDEPENDENT_AMBULATORY_CARE_PROVIDER_SITE_OTHER): Payer: Medicare Other | Admitting: Nurse Practitioner

## 2022-03-04 VITALS — BP 110/78 | HR 78 | Temp 98.1°F | Ht 68.0 in | Wt 201.8 lb

## 2022-03-04 DIAGNOSIS — Z0000 Encounter for general adult medical examination without abnormal findings: Secondary | ICD-10-CM | POA: Diagnosis not present

## 2022-03-04 DIAGNOSIS — E782 Mixed hyperlipidemia: Secondary | ICD-10-CM

## 2022-03-04 DIAGNOSIS — E1122 Type 2 diabetes mellitus with diabetic chronic kidney disease: Secondary | ICD-10-CM

## 2022-03-04 DIAGNOSIS — Z79899 Other long term (current) drug therapy: Secondary | ICD-10-CM | POA: Diagnosis not present

## 2022-03-04 DIAGNOSIS — N1831 Chronic kidney disease, stage 3a: Secondary | ICD-10-CM | POA: Diagnosis not present

## 2022-03-04 DIAGNOSIS — Z23 Encounter for immunization: Secondary | ICD-10-CM

## 2022-03-04 DIAGNOSIS — I129 Hypertensive chronic kidney disease with stage 1 through stage 4 chronic kidney disease, or unspecified chronic kidney disease: Secondary | ICD-10-CM

## 2022-03-04 DIAGNOSIS — Z124 Encounter for screening for malignant neoplasm of cervix: Secondary | ICD-10-CM

## 2022-03-04 DIAGNOSIS — E6609 Other obesity due to excess calories: Secondary | ICD-10-CM | POA: Diagnosis not present

## 2022-03-04 DIAGNOSIS — Z113 Encounter for screening for infections with a predominantly sexual mode of transmission: Secondary | ICD-10-CM

## 2022-03-04 DIAGNOSIS — Z01419 Encounter for gynecological examination (general) (routine) without abnormal findings: Secondary | ICD-10-CM | POA: Insufficient documentation

## 2022-03-04 DIAGNOSIS — Z1151 Encounter for screening for human papillomavirus (HPV): Secondary | ICD-10-CM | POA: Insufficient documentation

## 2022-03-04 DIAGNOSIS — I700 Atherosclerosis of aorta: Secondary | ICD-10-CM

## 2022-03-04 DIAGNOSIS — M47816 Spondylosis without myelopathy or radiculopathy, lumbar region: Secondary | ICD-10-CM

## 2022-03-04 DIAGNOSIS — Z1211 Encounter for screening for malignant neoplasm of colon: Secondary | ICD-10-CM

## 2022-03-04 DIAGNOSIS — Z6832 Body mass index (BMI) 32.0-32.9, adult: Secondary | ICD-10-CM

## 2022-03-04 DIAGNOSIS — N183 Chronic kidney disease, stage 3 unspecified: Secondary | ICD-10-CM | POA: Diagnosis not present

## 2022-03-04 LAB — POCT URINALYSIS DIPSTICK
Bilirubin, UA: NEGATIVE
Glucose, UA: POSITIVE — AB
Ketones, UA: NEGATIVE
Nitrite, UA: NEGATIVE
Protein, UA: NEGATIVE
Spec Grav, UA: 1.02 (ref 1.010–1.025)
Urobilinogen, UA: 0.2 E.U./dL
pH, UA: 7 (ref 5.0–8.0)

## 2022-03-04 NOTE — Progress Notes (Signed)
I,[name],acting as a Education administrator for [name], FNP.,have documented all relevant documentation on the behalf of [name], FNP,as directed by  [name], FNP while in the presence of [name], [place].   This visit occurred during the SARS-CoV-2 public health emergency.  Safety protocols were in place, including screening questions prior to the visit, additional usage of staff PPE, and extensive cleaning of exam room while observing appropriate contact time as indicated for disinfecting solutions.  Subjective:     Patient ID: Brittany Crawford , female    DOB: 05-Aug-1956 , 66 y.o.   MRN: [id number]   Chief Complaint  Patient presents with   Annual Exam    HPI  Pt here today for HM. She has been to ophthalmologist diagnosed with Glaucoma in right eye.  She is unable to see out of her left eye unsure of cause.     Past Medical History:  Diagnosis Date   Chronic back pain    H/O sarcoidosis    Hypertension    Seasonal allergies    Stroke [facility])      Family History  Problem Relation Age of Onset   Stroke Mother    Colon cancer Maternal Uncle    Breast cancer Neg Hx      Current Outpatient Medications:    Ascorbic Acid (VITAMIN C) 1000 MG tablet, Take 1,000 mg by mouth daily. Takes when coming down with a cold., Disp: , Rfl:    azelastine (OPTIVAR) 0.05 % ophthalmic solution, SMARTSIG:1 Drop(s) In Eye(s) Twice Daily PRN, Disp: , Rfl:    diclofenac sodium (VOLTAREN) 1 % GEL, Apply 2 g topically 4 (four) times daily., Disp: 300 g, Rfl: 2   HYDROcodone-acetaminophen (NORCO) 7.5-325 MG tablet, Take 1 tablet by mouth every 8 (eight) hours as needed for moderate pain. Do Not Fill Before 03/19/2022, Disp: 90 tablet, Rfl: 0   Hypromellose (ARTIFICIAL TEARS OP), Place 1 drop into the right eye daily as needed (for dry eyes)., Disp: , Rfl:    latanoprost (XALATAN) 0.005 % ophthalmic solution, 1 drop at bedtime., Disp: , Rfl:    losartan-hydrochlorothiazide (HYZAAR)  100-12.5 MG tablet, Take 1 tablet by mouth daily., Disp: 90 tablet, Rfl: 1   Multiple Vitamin (MULTIVITAMIN WITH MINERALS) TABS tablet, Take 1 tablet by mouth daily., Disp: , Rfl:    Olopatadine HCl 0.2 % SOLN, Apply 1 drop to eye daily as needed for allergies., Disp: , Rfl: 3   prednisoLONE acetate (PRED FORTE) 1 % ophthalmic suspension, Place 1 drop into the left eye as needed (flare ups). , Disp: , Rfl:    triamcinolone cream (KENALOG) 0.1 %, Apply 1 application topically at bedtime as needed (for rash)., Disp: 30 g, Rfl: 1   dapagliflozin propanediol (FARXIGA) 10 MG TABS tablet, TAKE 1 TABLET(10 MG) BY MOUTH DAILY BEFORE BREAKFAST, Disp: 30 tablet, Rfl: 2   metroNIDAZOLE (FLAGYL) 500 MG tablet, Take 1 tablet (500 mg total) by mouth 3 (three) times daily for 10 days., Disp: 30 tablet, Rfl: 0   Allergies  Allergen Reactions   Gadobenate Nausea And Vomiting    Pt was fine after a few minutes , vomiting after contrast injection smills    Ivp Dye [Iodinated Contrast Media]       The patient states she uses status post hysterectomy for birth control. Patient's last menstrual period was 11/13/2012.. Negative for Dysmenorrhea and Negative for Menorrhagia. Negative for: breast discharge, breast lump(s), breast pain and breast self exam. Associated symptoms include  abnormal vaginal bleeding. Pertinent negatives include abnormal bleeding (hematology), anxiety, decreased libido, depression, difficulty falling sleep, dyspareunia, history of infertility, nocturia, sexual dysfunction, sleep disturbances, urinary incontinence, urinary urgency, vaginal discharge and vaginal itching. Diet regular. The patient states her exercise level is minimal - 5-7 minutes a day.   The patient's tobacco use is:  Social History   Tobacco Use  Smoking Status Former   Packs/day: 0.00   Years: 0.00   Pack years: 0.00   Types: Cigarettes   Quit date: 03/13/2017   Years since quitting: 4.9  Smokeless Tobacco Never   Tobacco Comments   4  to 5  cigarettes daily for years   She has been exposed to passive smoke. The patient's alcohol use is:  Social History   Substance and Sexual Activity  Alcohol Use Not Currently   Comment: occassionally   Additional information: Last pap 08/2019, next one scheduled for today.    Review of Systems  Constitutional: Negative.   HENT: Negative.    Eyes: Negative.   Respiratory: Negative.    Cardiovascular: Negative.   Gastrointestinal: Negative.   Endocrine: Negative.   Genitourinary: Negative.   Musculoskeletal: Negative.   Skin: Negative.   Allergic/Immunologic: Negative.   Neurological: Negative.   Hematological: Negative.   Psychiatric/Behavioral: Negative.      Today's Vitals   03/04/22 0938  BP: 110/78  Pulse: 78  Temp: 98.1 F (36.7 C)  SpO2: 98%  Weight: 201 lb 12.8 oz (91.5 kg)  Height: '5\' 8"'  (1.727 m)  PainSc: 0-No pain   Body mass index is 30.68 kg/m.  Wt Readings from Last 3 Encounters:  03/04/22 201 lb 12.8 oz (91.5 kg)  02/11/22 202 lb (91.6 kg)  12/17/21 201 lb 12.8 oz (91.5 kg)    Objective:  Physical Exam Vitals reviewed. Exam conducted with a chaperone present.  Constitutional:      General: She is not in acute distress.    Appearance: Normal appearance. She is well-developed. She is obese.  HENT:     Head: Normocephalic and atraumatic.     Right Ear: Hearing, tympanic membrane, ear canal and external ear normal. There is no impacted cerumen.     Left Ear: Hearing, tympanic membrane, ear canal and external ear normal. There is no impacted cerumen.     Nose: Nose normal.     Mouth/Throat:     Mouth: Mucous membranes are moist.  Eyes:     General: Lids are normal.     Extraocular Movements: Extraocular movements intact.     Conjunctiva/sclera: Conjunctivae normal.     Pupils: Pupils are equal, round, and reactive to light.     Funduscopic exam:    Right eye: No papilledema.        Left eye: No papilledema.  Neck:      Thyroid: No thyroid mass.     Vascular: No carotid bruit.  Cardiovascular:     Rate and Rhythm: Normal rate and regular rhythm.     Pulses: Normal pulses.     Heart sounds: Normal heart sounds. No murmur heard. Pulmonary:     Effort: Pulmonary effort is normal. No respiratory distress.     Breath sounds: Normal breath sounds. No wheezing.  Abdominal:     General: Bowel sounds are normal. There is no distension.     Palpations: Abdomen is soft.     Tenderness: There is no abdominal tenderness.     Comments: round  Genitourinary:    General: Normal vulva.  Tanner stage (genital): 5.     Labia:        Right: No rash or tenderness.        Left: No rash or tenderness.      Urethra: No prolapse.     Vagina: Normal.     Cervix: Normal.     Uterus: Normal.      Adnexa: Right adnexa normal and left adnexa normal.     Rectum: Normal. Guaiac result negative.  Musculoskeletal:        General: No swelling or tenderness. Normal range of motion.     Cervical back: Full passive range of motion without pain, normal range of motion and neck supple.  Skin:    General: Skin is warm and dry.     Capillary Refill: Capillary refill takes less than 2 seconds.     Coloration: Skin is not jaundiced.     Findings: No bruising.  Neurological:     Mental Status: She is alert and oriented to person, place, and time.     Cranial Nerves: No cranial nerve deficit.     Sensory: No sensory deficit.     Motor: No weakness.  Psychiatric:        Behavior: Behavior normal.        Thought Content: Thought content normal.        Judgment: Judgment normal.        Assessment And Plan:     1. Encounter for annual health examination Behavior modifications discussed and diet history reviewed.   Pt will continue to exercise regularly and modify diet with low GI, plant based foods and decrease intake of processed foods.  Recommend intake of daily multivitamin, Vitamin D, and calcium.  Recommend mammogram  and colonoscopy for preventive screenings, as well as recommend immunizations that include influenza, TDAP, and Shingles  2. Class 1 obesity due to excess calories without serious comorbidity with body mass index (BMI) of 32.0 to 32.9 in adult Chronic Discussed healthy diet and regular exercise options  Encouraged to exercise at least 150 minutes per week with 2 days of strength training  3. Encounter for screening colonoscopy According to USPTF Colorectal cancer Screening guidelines. Colonoscopy is recommended every 10 years, starting at age 97 years. Will refer to GI for colon cancer screening. - Ambulatory referral to Gastroenterology  4. Encounter for Papanicolaou smear of cervix No abnormal findings on physical exam - Cytology - PAP  5. Screening for STD (sexually transmitted disease) - Cervicovaginal ancillary only  6. Need for Tdap vaccination - Tdap vaccine greater than or equal to 7yo IM  7. Benign hypertension with chronic kidney disease, stage III ([place]) Comments: Blood pressure is well controlled, continue current medications. EKG done with NSR HR 74 - POCT Urinalysis Dipstick (81002) - Microalbumin / creatinine urine ratio - EKG 12-Lead  8. Mixed hyperlipidemia - POCT Urinalysis Dipstick (81002) - Microalbumin / creatinine urine ratio - EKG 12-Lead - Lipid panel  9. Type 2 diabetes mellitus with stage 3a chronic kidney disease, without long-term current use of insulin ([place]) Comments: She is tolerating [place] well. Continue avoiding foods high in sugar and starches.  - POCT Urinalysis Dipstick (81002) - Microalbumin / creatinine urine ratio - CMP14+EGFR - Hemoglobin A1c - Lipid panel - Ambulatory referral to diabetic education  10. Aortic atherosclerosis ([place]) Comments: Will need to try to get her started on a statin, this was seen on CT scan chest - Lipid panel  11. Lumbar facet arthropathy Comments: Continue  follow up with Pain  Management/Orthopedics  12. Spondylosis of lumbar region without myelopathy or radiculopathy  13. Other long term (current) drug therapy - CBC She is encouraged to strive for BMI less than 30 to decrease cardiac risk. Advised to aim for at least 150 minutes of exercise per week.     Patient was given opportunity to ask questions. Patient verbalized understanding of the plan and was able to repeat key elements of the plan. All questions were answered to their satisfaction.   [name], FNP    I, [name], FNP, have reviewed all documentation for this visit. The documentation on 03/04/22 for the exam, diagnosis, procedures, and orders are all accurate and complete.   THE PATIENT IS ENCOURAGED TO PRACTICE SOCIAL DISTANCING DUE TO THE COVID-19 PANDEMIC.

## 2022-03-04 NOTE — Patient Instructions (Signed)

## 2022-03-05 LAB — CBC
Hematocrit: 40.8 % (ref 34.0–46.6)
Hemoglobin: 13.6 g/dL (ref 11.1–15.9)
MCH: 28.2 pg (ref 26.6–33.0)
MCHC: 33.3 g/dL (ref 31.5–35.7)
MCV: 85 fL (ref 79–97)
Platelets: 275 10*3/uL (ref 150–450)
RBC: 4.82 x10E6/uL (ref 3.77–5.28)
RDW: 14.4 % (ref 11.7–15.4)
WBC: 5.4 10*3/uL (ref 3.4–10.8)

## 2022-03-05 LAB — CMP14+EGFR
ALT: 9 IU/L (ref 0–32)
AST: 13 IU/L (ref 0–40)
Albumin/Globulin Ratio: 1.5 (ref 1.2–2.2)
Albumin: 4.6 g/dL (ref 3.8–4.8)
Alkaline Phosphatase: 65 IU/L (ref 44–121)
BUN/Creatinine Ratio: 10 — ABNORMAL LOW (ref 12–28)
BUN: 11 mg/dL (ref 8–27)
Bilirubin Total: 0.8 mg/dL (ref 0.0–1.2)
CO2: 22 mmol/L (ref 20–29)
Calcium: 10 mg/dL (ref 8.7–10.3)
Chloride: 102 mmol/L (ref 96–106)
Creatinine, Ser: 1.11 mg/dL — ABNORMAL HIGH (ref 0.57–1.00)
Globulin, Total: 3.1 g/dL (ref 1.5–4.5)
Glucose: 101 mg/dL — ABNORMAL HIGH (ref 70–99)
Potassium: 3.9 mmol/L (ref 3.5–5.2)
Sodium: 140 mmol/L (ref 134–144)
Total Protein: 7.7 g/dL (ref 6.0–8.5)
eGFR: 56 mL/min/{1.73_m2} — ABNORMAL LOW (ref >59)

## 2022-03-05 LAB — MICROALBUMIN / CREATININE URINE RATIO
Creatinine, Urine: 80.3 mg/dL
Microalb/Creat Ratio: <4 mg/g creat (ref 0–29)
Microalbumin, Urine: <3 ug/mL

## 2022-03-05 LAB — HEMOGLOBIN A1C
Est. average glucose Bld gHb Est-mCnc: 128 mg/dL
Hgb A1c MFr Bld: 6.1 % — ABNORMAL HIGH (ref 4.8–5.6)

## 2022-03-05 LAB — LIPID PANEL
Chol/HDL Ratio: 3.8 ratio (ref 0.0–4.4)
Cholesterol, Total: 216 mg/dL — ABNORMAL HIGH (ref 100–199)
HDL: 57 mg/dL (ref >39)
LDL Chol Calc (NIH): 130 mg/dL — ABNORMAL HIGH (ref 0–99)
Triglycerides: 165 mg/dL — ABNORMAL HIGH (ref 0–149)
VLDL Cholesterol Cal: 29 mg/dL (ref 5–40)

## 2022-03-06 ENCOUNTER — Other Ambulatory Visit: Payer: Self-pay | Admitting: Nurse Practitioner

## 2022-03-06 ENCOUNTER — Other Ambulatory Visit: Payer: Self-pay

## 2022-03-06 DIAGNOSIS — B9689 Other specified bacterial agents as the cause of diseases classified elsewhere: Secondary | ICD-10-CM

## 2022-03-06 DIAGNOSIS — E1122 Type 2 diabetes mellitus with diabetic chronic kidney disease: Secondary | ICD-10-CM

## 2022-03-06 LAB — CERVICOVAGINAL ANCILLARY ONLY
Bacterial Vaginitis (gardnerella): POSITIVE — AB
Candida Glabrata: NEGATIVE
Candida Vaginitis: NEGATIVE
Comment: NEGATIVE
Comment: NEGATIVE
Comment: NEGATIVE

## 2022-03-06 MED ORDER — METRONIDAZOLE 500 MG PO TABS: 500.0000 mg | ORAL_TABLET | Freq: Three times a day (TID) | ORAL | 0 refills | Status: DC

## 2022-03-06 MED ORDER — DAPAGLIFLOZIN PROPANEDIOL 10 MG PO TABS: ORAL_TABLET | ORAL | 2 refills | Status: DC

## 2022-03-07 ENCOUNTER — Encounter: Payer: Self-pay | Admitting: Internal Medicine

## 2022-03-07 ENCOUNTER — Other Ambulatory Visit: Payer: Self-pay

## 2022-03-07 DIAGNOSIS — B9689 Other specified bacterial agents as the cause of diseases classified elsewhere: Secondary | ICD-10-CM

## 2022-03-07 LAB — CYTOLOGY - PAP
Adequacy: ABSENT
Comment: NEGATIVE
Diagnosis: NEGATIVE
High risk HPV: NEGATIVE

## 2022-03-07 MED ORDER — METRONIDAZOLE 500 MG PO TABS: 500.0000 mg | ORAL_TABLET | Freq: Three times a day (TID) | ORAL | 0 refills | Status: AC

## 2022-03-18 ENCOUNTER — Ambulatory Visit (INDEPENDENT_AMBULATORY_CARE_PROVIDER_SITE_OTHER): Payer: Medicare Other | Admitting: Nurse Practitioner

## 2022-03-18 ENCOUNTER — Encounter: Payer: Self-pay | Admitting: Nurse Practitioner

## 2022-03-18 VITALS — BP 124/82 | HR 86 | Temp 98.1°F | Ht 68.0 in | Wt 201.6 lb

## 2022-03-18 DIAGNOSIS — R109 Unspecified abdominal pain: Secondary | ICD-10-CM

## 2022-03-18 DIAGNOSIS — R350 Frequency of micturition: Secondary | ICD-10-CM

## 2022-03-18 LAB — POCT URINALYSIS DIPSTICK
Bilirubin, UA: NEGATIVE
Blood, UA: NEGATIVE
Glucose, UA: POSITIVE — AB
Ketones, UA: NEGATIVE
Nitrite, UA: NEGATIVE
Protein, UA: NEGATIVE
Spec Grav, UA: 1.015 (ref 1.010–1.025)
Urobilinogen, UA: 0.2 E.U./dL
pH, UA: 7 (ref 5.0–8.0)

## 2022-03-18 MED ORDER — PHENAZOPYRIDINE HCL 100 MG PO TABS
100.0000 mg | ORAL_TABLET | Freq: Three times a day (TID) | ORAL | 0 refills | Status: DC | PRN
Start: 2022-03-18 — End: 2023-01-06

## 2022-03-18 MED ORDER — SULFAMETHOXAZOLE-TRIMETHOPRIM 800-160 MG PO TABS: 1.0000 | ORAL_TABLET | Freq: Two times a day (BID) | ORAL | 0 refills | Status: DC

## 2022-03-18 NOTE — Progress Notes (Signed)
I,[name],acting as a Education administrator for [name], FNP.,have documented all relevant documentation on the behalf of [name], FNP,as directed by  [name], FNP while in the presence of [name], [place].   This visit occurred during the SARS-CoV-2 public health emergency.  Safety protocols were in place, including screening questions prior to the visit, additional usage of staff PPE, and extensive cleaning of exam room while observing appropriate contact time as indicated for disinfecting solutions.  Subjective:     Patient ID: Brittany Crawford , female    DOB: [birth year] , 66 y.o.   MRN: [id number]   Chief Complaint  Patient presents with   Abdominal Cramping    HPI  Pt here for abdominal cramping, she reports started last Thursday.  She does have urinary frequency, & drinks lots of water. She had pap a week ago, came back normal.  She has been taking metronidazole for 3-4 days now.   Abdominal Cramping Associated symptoms include frequency.     Past Medical History:  Diagnosis Date   Chronic back pain    H/O sarcoidosis    Hypertension    Seasonal allergies    Stroke [facility])      Family History  Problem Relation Age of Onset   Stroke Mother    Colon cancer Maternal Uncle    Breast cancer Neg Hx      Current Outpatient Medications:    Ascorbic Acid (VITAMIN C) 1000 MG tablet, Take 1,000 mg by mouth daily. Takes when coming down with a cold., Disp: , Rfl:    azelastine (OPTIVAR) 0.05 % ophthalmic solution, SMARTSIG:1 Drop(s) In Eye(s) Twice Daily PRN, Disp: , Rfl:    dapagliflozin propanediol (FARXIGA) 10 MG TABS tablet, TAKE 1 TABLET(10 MG) BY MOUTH DAILY BEFORE BREAKFAST, Disp: 30 tablet, Rfl: 2   diclofenac sodium (VOLTAREN) 1 % GEL, Apply 2 g topically 4 (four) times daily., Disp: 300 g, Rfl: 2   HYDROcodone-acetaminophen (NORCO) 7.5-325 MG tablet, Take 1 tablet by mouth every 8 (eight) hours as needed for moderate pain. Do Not Fill Before 03/19/2022,  Disp: 90 tablet, Rfl: 0   Hypromellose (ARTIFICIAL TEARS OP), Place 1 drop into the right eye daily as needed (for dry eyes)., Disp: , Rfl:    latanoprost (XALATAN) 0.005 % ophthalmic solution, 1 drop at bedtime., Disp: , Rfl:    losartan-hydrochlorothiazide (HYZAAR) 100-12.5 MG tablet, Take 1 tablet by mouth daily., Disp: 90 tablet, Rfl: 1   Multiple Vitamin (MULTIVITAMIN WITH MINERALS) TABS tablet, Take 1 tablet by mouth daily., Disp: , Rfl:    Olopatadine HCl 0.2 % SOLN, Apply 1 drop to eye daily as needed for allergies., Disp: , Rfl: 3   phenazopyridine (PYRIDIUM) 100 MG tablet, Take 1 tablet (100 mg total) by mouth 3 (three) times daily as needed for pain., Disp: 10 tablet, Rfl: 0   prednisoLONE acetate (PRED FORTE) 1 % ophthalmic suspension, Place 1 drop into the left eye as needed (flare ups). , Disp: , Rfl:    sulfamethoxazole-trimethoprim (BACTRIM DS) 800-160 MG tablet, Take 1 tablet by mouth 2 (two) times daily., Disp: 10 tablet, Rfl: 0   triamcinolone cream (KENALOG) 0.1 %, Apply 1 application topically at bedtime as needed (for rash)., Disp: 30 g, Rfl: 1   Allergies  Allergen Reactions   Gadobenate Nausea And Vomiting    Pt was fine after a few minutes , vomiting after contrast injection smills    Ivp Dye [Iodinated Contrast Media]  Review of Systems  Constitutional: Negative.   Respiratory: Negative.    Cardiovascular: Negative.   Gastrointestinal:  Positive for abdominal pain (abdomen cramping).  Endocrine: Negative for polydipsia, polyphagia and polyuria.  Genitourinary:  Positive for frequency.  Neurological: Negative.   Psychiatric/Behavioral: Negative.       Today's Vitals   03/18/22 1037  BP: 124/82  Pulse: 86  Temp: 98.1 F (36.7 C)  SpO2: 94%  Weight: 201 lb 9.6 oz (91.4 kg)  Height: '5\' 8"'$  (1.727 m)  PainSc: 6    Body mass index is 30.65 kg/m.  Wt Readings from Last 3 Encounters:  03/18/22 201 lb 9.6 oz (91.4 kg)  03/04/22 201 lb 12.8 oz (91.5 kg)   02/11/22 202 lb (91.6 kg)    Objective:  Physical Exam Vitals reviewed.  Constitutional:      General: She is not in acute distress.    Appearance: Normal appearance. She is well-developed. She is obese.  HENT:     Right Ear: Hearing normal.     Left Ear: Hearing normal.     Nose:     Comments: Deferred - masked    Mouth/Throat:     Comments: Deferred - masked Eyes:     General: Lids are normal.     Funduscopic exam:    Right eye: No papilledema.        Left eye: No papilledema.  Neck:     Thyroid: No thyroid mass.  Cardiovascular:     Rate and Rhythm: Normal rate and regular rhythm.     Pulses: Normal pulses.     Heart sounds: Normal heart sounds. No murmur heard. Pulmonary:     Effort: Pulmonary effort is normal. No respiratory distress.     Breath sounds: Normal breath sounds. No wheezing.  Abdominal:     General: Abdomen is flat. Bowel sounds are normal. There is no distension.     Palpations: Abdomen is soft.     Tenderness: There is abdominal tenderness (mildly tender to left lower abdomen).  Genitourinary:    Labia:        Right: No rash or tenderness.        Left: No rash or tenderness.      Vagina: Normal.     Cervix: Normal.     Uterus: Normal.      Adnexa: Right adnexa normal and left adnexa normal.  Musculoskeletal:     Cervical back: Full passive range of motion without pain.  Skin:    General: Skin is warm and dry.     Capillary Refill: Capillary refill takes less than 2 seconds.     Coloration: Skin is not jaundiced.     Findings: No bruising.  Neurological:     General: No focal deficit present.     Mental Status: She is alert and oriented to person, place, and time.     Cranial Nerves: No cranial nerve deficit.     Sensory: No sensory deficit.  Psychiatric:        Mood and Affect: Mood normal.        Behavior: Behavior normal.        Thought Content: Thought content normal.        Judgment: Judgment normal.         Assessment And Plan:      1. Urinary frequency Comments: Trace white cells in urine, not different from previous. Encouraged to stay well hydrated with water and can drink small glass of cranberry juice. -  POCT Urinalysis Dipstick (81002) - Culture, Urine - phenazopyridine (PYRIDIUM) 100 MG tablet; Take 1 tablet (100 mg total) by mouth 3 (three) times daily as needed for pain.  Dispense: 10 tablet; Refill: 0 - sulfamethoxazole-trimethoprim (BACTRIM DS) 800-160 MG tablet; Take 1 tablet by mouth 2 (two) times daily.  Dispense: 10 tablet; Refill: 0  2. Abdominal cramping Comments: Will check urine culture and provide bladder antispasmodic  - Culture, Urine     Patient was given opportunity to ask questions. Patient verbalized understanding of the plan and was able to repeat key elements of the plan. All questions were answered to their satisfaction.  [name], FNP   I, [name], FNP, have reviewed all documentation for this visit. The documentation on 03/18/22 for the exam, diagnosis, procedures, and orders are all accurate and complete.   IF YOU HAVE BEEN REFERRED TO A SPECIALIST, IT MAY TAKE 1-2 WEEKS TO SCHEDULE/PROCESS THE REFERRAL. IF YOU HAVE NOT HEARD FROM US/SPECIALIST IN TWO WEEKS, PLEASE GIVE [place] A CALL AT [phone].   THE PATIENT IS ENCOURAGED TO PRACTICE SOCIAL DISTANCING DUE TO THE COVID-19 PANDEMIC.

## 2022-03-18 NOTE — Patient Instructions (Signed)

## 2022-03-19 LAB — URINE CULTURE

## 2022-03-30 ENCOUNTER — Encounter: Payer: Self-pay | Admitting: Nurse Practitioner

## 2022-04-07 ENCOUNTER — Other Ambulatory Visit: Payer: Self-pay

## 2022-04-07 DIAGNOSIS — N1831 Chronic kidney disease, stage 3a: Secondary | ICD-10-CM

## 2022-04-07 MED ORDER — DAPAGLIFLOZIN PROPANEDIOL 10 MG PO TABS: ORAL_TABLET | ORAL | 2 refills | Status: DC

## 2022-04-10 DIAGNOSIS — H5213 Myopia, bilateral: Secondary | ICD-10-CM | POA: Diagnosis not present

## 2022-04-17 ENCOUNTER — Encounter: Payer: Medicare Other | Attending: Registered Nurse | Admitting: Registered Nurse

## 2022-04-17 VITALS — BP 133/84 | HR 73 | Ht 68.0 in | Wt 202.4 lb

## 2022-04-17 DIAGNOSIS — M2550 Pain in unspecified joint: Secondary | ICD-10-CM | POA: Diagnosis not present

## 2022-04-17 DIAGNOSIS — G894 Chronic pain syndrome: Secondary | ICD-10-CM | POA: Diagnosis not present

## 2022-04-17 DIAGNOSIS — Z79891 Long term (current) use of opiate analgesic: Secondary | ICD-10-CM | POA: Insufficient documentation

## 2022-04-17 DIAGNOSIS — Z5181 Encounter for therapeutic drug level monitoring: Secondary | ICD-10-CM | POA: Diagnosis not present

## 2022-04-17 DIAGNOSIS — M47816 Spondylosis without myelopathy or radiculopathy, lumbar region: Secondary | ICD-10-CM | POA: Insufficient documentation

## 2022-04-17 MED ORDER — HYDROCODONE-ACETAMINOPHEN 7.5-325 MG PO TABS: 1.0000 | ORAL_TABLET | Freq: Three times a day (TID) | ORAL | 0 refills | Status: DC | PRN

## 2022-04-17 NOTE — Progress Notes (Signed)
Subjective:    Patient ID: Brittany Crawford, female    DOB: Jun 29, 1956, 66 y.o.   MRN: [id number]  HPI: Brittany Crawford is a 66 y.o. female who returns for follow up appointment for chronic pain and medication refill. She states her pain is located in her lower back and generalized joint pain. She rates her pain 5. Her current exercise regime is walking and performing stretching exercises.  [name] Morphine equivalent is 23.25 MME.   Last UDS was Performed on 12/17/2021, it was consistent.     Pain Inventory Average Pain 8 Pain Right Now 5 My pain is sharp and aching  In the last 24 hours, has pain interfered with the following? General activity 5 Relation with others 10 Enjoyment of life 9 What TIME of day is your pain at its worst? morning  and evening Sleep (in general) Good  Pain is worse with: bending and standing Pain improves with: rest, heat/ice, therapy/exercise, and medication Relief from Meds: 8  Family History  Problem Relation Age of Onset   Stroke Mother    Colon cancer Maternal Uncle    Breast cancer Neg Hx    Social History   Socioeconomic History   Marital status: Married    Spouse name: Not on file   Number of children: 2   Years of education: 12   Highest education level: Not on file  Occupational History   Occupation: disability  Tobacco Use   Smoking status: Former    Packs/day: 0.00    Years: 0.00    Total pack years: 0.00    Types: Cigarettes    Quit date: 03/13/2017    Years since quitting: 5.0   Smokeless tobacco: Never   Tobacco comments:    4  to 5  cigarettes daily for years  Vaping Use   Vaping Use: Never used  Substance and Sexual Activity   Alcohol use: Not Currently    Comment: occassionally   Drug use: Yes    Types: Hydrocodone   Sexual activity: Not Currently  Other Topics Concern   Not on file  Social History Narrative   Patient is married with 2 children.   Patient is right handed.   Patient has hs education.    Patient drinks 4 cups daily.   Social Determinants of Health   Financial Resource Strain: Low Risk  (07/24/2021)   Overall Financial Resource Strain (CARDIA)    Difficulty of Paying Living Expenses: Not hard at all  Food Insecurity: No Food Insecurity (07/24/2021)   Hunger Vital Sign    Worried About Running Out of Food in the Last Year: Never true    Ran Out of Food in the Last Year: Never true  Transportation Needs: No Transportation Needs (07/24/2021)   PRAPARE - Hydrologist (Medical): No    Lack of Transportation (Non-Medical): No  Physical Activity: Insufficiently Active (07/24/2021)   Exercise Vital Sign    Days of Exercise per Week: 2 days    Minutes of Exercise per Session: 10 min  Stress: No Stress Concern Present (07/24/2021)   [place]    Feeling of Stress : Not at all  Social Connections: Not on file   Past Surgical History:  Procedure Laterality Date   BREAST BIOPSY Left 04/29/2016   BREAST BIOPSY Left 04/23/2016   DILATATION & CURRETTAGE/HYSTEROSCOPY WITH RESECTOCOPE N/A 12/10/2012   Procedure: [place];  Surgeon: Marvene Staff, MD;  Location: [facility];  Service: Gynecology;  Laterality: N/A;   FOOT SURGERY     left-pins placed   LYMPH NODE BIOPSY     POLYPECTOMY N/A 12/10/2012   Procedure: POLYPECTOMY;  Surgeon: Marvene Staff, MD;  Location: [facility];  Service: Gynecology;  Laterality: N/A;   STRABISMUS SURGERY Left 04/17/2017   Procedure: REPAIR STRABISMUS LEFT EYE;  Surgeon: Everitt Amber, MD;  Location: [facility];  Service: Ophthalmology;  Laterality: Left;   Past Surgical History:  Procedure Laterality Date   BREAST BIOPSY Left 04/29/2016   BREAST BIOPSY Left 04/23/2016   DILATATION & CURRETTAGE/HYSTEROSCOPY WITH RESECTOCOPE N/A 12/10/2012   Procedure: DILATATION & CURETTAGE/HYSTEROSCOPY WITH  RESECTOCOPE;  Surgeon: Marvene Staff, MD;  Location: [facility];  Service: Gynecology;  Laterality: N/A;   FOOT SURGERY     left-pins placed   LYMPH NODE BIOPSY     POLYPECTOMY N/A 12/10/2012   Procedure: POLYPECTOMY;  Surgeon: Marvene Staff, MD;  Location: [facility];  Service: Gynecology;  Laterality: N/A;   STRABISMUS SURGERY Left 04/17/2017   Procedure: REPAIR STRABISMUS LEFT EYE;  Surgeon: Everitt Amber, MD;  Location: [facility];  Service: Ophthalmology;  Laterality: Left;   Past Medical History:  Diagnosis Date   Chronic back pain    H/O sarcoidosis    Hypertension    Seasonal allergies    Stroke (HCC)    BP 133/84   Pulse 73   Ht '5\' 8"'$  (1.727 m)   Wt 202 lb 6.4 oz (91.8 kg)   LMP 11/13/2012   SpO2 94%   BMI 30.77 kg/m   Opioid Risk Score:   Fall Risk Score:  `1  Depression screen [facility] 2/9     04/17/2022   10:26 AM 12/17/2021   10:35 AM 10/22/2021   10:07 AM 08/13/2021   10:56 AM 07/24/2021   12:01 PM 04/12/2021   11:04 AM 08/30/2020    9:25 AM  Depression screen PHQ 2/9  Decreased Interest 0 0 0 0 0 0 0  Down, Depressed, Hopeless 0 0 0 0 0 0 0  PHQ - 2 Score 0 0 0 0 0 0 0     Review of Systems  Musculoskeletal:  Positive for back pain.  All other systems reviewed and are negative.     Objective:   Physical Exam Vitals and nursing note reviewed.  Constitutional:      Appearance: Normal appearance.  Cardiovascular:     Rate and Rhythm: Normal rate and regular rhythm.     Pulses: Normal pulses.     Heart sounds: Normal heart sounds.  Pulmonary:     Effort: Pulmonary effort is normal.     Breath sounds: Normal breath sounds.  Musculoskeletal:     Cervical back: Normal range of motion and neck supple.     Comments: Normal Muscle Bulk and Muscle Testing Reveals:  Upper Extremities: Full ROM and Muscle Strength 5/5 Bilateral AC Joint Tenderness Thoracic Paraspinal Tenderness: T-7- T-9 Lumbar Paraspinal Tenderness: L-3-L-5 Lower  Extremities: Full ROM and Muscle Strength 5/5 Arises from Table with Ease Narrow Based  Gait     Skin:    General: Skin is warm and dry.  Neurological:     Mental Status: She is alert and oriented to person, place, and time.  Psychiatric:        Mood and Affect: Mood normal.        Behavior: Behavior normal.  Assessment & Plan:  1. Lumbar spondylosis with DDD and facet arthropathy.Left Lumbar Radiculitis: Continue HEP as Tolerated. Continue to Monitor.  04/17/2022 Refilled: Hydrocodone 7.5 /325 mg one tablet every 8 hours as needed for pain. Increased to  #90 tablets. . Second script e-scribe for the following month. We will continue the opioid monitoring program, this consists of regular clinic visits, examinations, urine drug screen, pill counts as well as use of [place] Controlled Substance Reporting system. A 12 month History has been reviewed on the [place] on 04/17/2022. 2. Bilateral knee pain: No complaints voiced today: Continue with heat/ice, exercise and Diclofenac. 04/17/2022 3. Right thalamic/internal capsule lacunar infarct with persistent left hemisensory deficits: Neurology Following. Continue to Monitor. 04/17/2022. 4.Left Greater Trochanteric Bursitis: no complaints today. Continue HEP as Tolerated and Alternate Ice and Heat Therapy.  Continue current medication regime. Continue to Monitor.  04/17/2022. 5. Polyarthralgia: Continue HEP as Tolerated. Continue to monitor. 04/17/2022    F/U in 2 months

## 2022-04-21 ENCOUNTER — Ambulatory Visit: Payer: Medicare Other | Admitting: Registered Nurse

## 2022-04-24 ENCOUNTER — Encounter: Payer: Self-pay | Admitting: Registered Nurse

## 2022-04-30 DIAGNOSIS — H524 Presbyopia: Secondary | ICD-10-CM | POA: Diagnosis not present

## 2022-05-13 ENCOUNTER — Encounter: Payer: Self-pay | Admitting: Nurse Practitioner

## 2022-05-13 ENCOUNTER — Ambulatory Visit (INDEPENDENT_AMBULATORY_CARE_PROVIDER_SITE_OTHER): Payer: Medicare Other | Admitting: Nurse Practitioner

## 2022-05-13 VITALS — BP 110/80 | HR 84 | Temp 98.1°F | Ht 68.0 in | Wt 202.0 lb

## 2022-05-13 DIAGNOSIS — J3489 Other specified disorders of nose and nasal sinuses: Secondary | ICD-10-CM

## 2022-05-13 DIAGNOSIS — J029 Acute pharyngitis, unspecified: Secondary | ICD-10-CM

## 2022-05-13 LAB — POC COVID19 BINAXNOW: SARS Coronavirus 2 Ag: NEGATIVE

## 2022-05-13 MED ORDER — AMOXICILLIN-POT CLAVULANATE 875-125 MG PO TABS: 1.0000 | ORAL_TABLET | Freq: Two times a day (BID) | ORAL | 0 refills | Status: DC

## 2022-05-13 NOTE — Patient Instructions (Addendum)

## 2022-05-13 NOTE — Progress Notes (Signed)
I,[name],acting as a Education administrator for [name], FNP.,have documented all relevant documentation on the behalf of [name], FNP,as directed by  [name], FNP while in the presence of [name], [place].    Subjective:     Patient ID: Brittany Crawford , female    DOB: Oct 21, 1955 , 66 y.o.   MRN: [id number]   Chief Complaint  Patient presents with   Sinus Problem    HPI  Pt presents for sinus problem. She reports this starting on Friday.  Aching ears, dry nose, faint soreness in her throat.  Patient also states she had left over antibiotics, she had only 4, she took those & after she ran out she only took tylenol as needed.    Sinus Problem This is a new problem. The current episode started in the past 7 days. The problem has been gradually improving since onset. There has been no fever. Associated symptoms include chills, ear pain (ear ache), a hoarse voice and a sore throat. Pertinent negatives include no congestion, coughing, headaches or sinus pressure. Past treatments include nasal decongestants, antibiotics and acetaminophen (she has taken 4 left over antibiotics (bactrim).).     Past Medical History:  Diagnosis Date   Chronic back pain    H/O sarcoidosis    Hypertension    Seasonal allergies    Stroke [facility])      Family History  Problem Relation Age of Onset   Stroke Mother    Colon cancer Maternal Uncle    Breast cancer Neg Hx      Current Outpatient Medications:    amoxicillin-clavulanate (AUGMENTIN) 875-125 MG tablet, Take 1 tablet by mouth 2 (two) times daily., Disp: 20 tablet, Rfl: 0   Ascorbic Acid (VITAMIN C) 1000 MG tablet, Take 1,000 mg by mouth daily. Takes when coming down with a cold., Disp: , Rfl:    azelastine (OPTIVAR) 0.05 % ophthalmic solution, SMARTSIG:1 Drop(s) In Eye(s) Twice Daily PRN, Disp: , Rfl:    dapagliflozin propanediol (FARXIGA) 10 MG TABS tablet, TAKE 1 TABLET(10 MG) BY MOUTH DAILY BEFORE BREAKFAST, Disp: 30 tablet, Rfl:  2   diclofenac sodium (VOLTAREN) 1 % GEL, Apply 2 g topically 4 (four) times daily., Disp: 300 g, Rfl: 2   HYDROcodone-acetaminophen (NORCO) 7.5-325 MG tablet, Take 1 tablet by mouth every 8 (eight) hours as needed for moderate pain. Do Not Fill Before 05/17/2022, Disp: 90 tablet, Rfl: 0   Hypromellose (ARTIFICIAL TEARS OP), Place 1 drop into the right eye daily as needed (for dry eyes)., Disp: , Rfl:    latanoprost (XALATAN) 0.005 % ophthalmic solution, 1 drop at bedtime., Disp: , Rfl:    losartan-hydrochlorothiazide (HYZAAR) 100-12.5 MG tablet, Take 1 tablet by mouth daily., Disp: 90 tablet, Rfl: 1   Multiple Vitamin (MULTIVITAMIN WITH MINERALS) TABS tablet, Take 1 tablet by mouth daily., Disp: , Rfl:    Olopatadine HCl 0.2 % SOLN, Apply 1 drop to eye daily as needed for allergies., Disp: , Rfl: 3   phenazopyridine (PYRIDIUM) 100 MG tablet, Take 1 tablet (100 mg total) by mouth 3 (three) times daily as needed for pain., Disp: 10 tablet, Rfl: 0   prednisoLONE acetate (PRED FORTE) 1 % ophthalmic suspension, Place 1 drop into the left eye as needed (flare ups). , Disp: , Rfl:    sulfamethoxazole-trimethoprim (BACTRIM DS) 800-160 MG tablet, Take 1 tablet by mouth 2 (two) times daily., Disp: 10 tablet, Rfl: 0   triamcinolone cream (KENALOG) 0.1 %, Apply 1 application topically  at bedtime as needed (for rash)., Disp: 30 g, Rfl: 1   Allergies  Allergen Reactions   Gadobenate Nausea And Vomiting    Pt was fine after a few minutes , vomiting after contrast injection smills    Ivp Dye [Iodinated Contrast Media]      Review of Systems  Constitutional:  Positive for chills.  HENT:  Positive for ear pain (ear ache), hoarse voice and sore throat. Negative for congestion and sinus pressure.   Respiratory: Negative.  Negative for cough.   Cardiovascular: Negative.   Neurological: Negative.  Negative for headaches.  Psychiatric/Behavioral: Negative.       Today's Vitals   05/13/22 1532  BP: 110/80   Pulse: 84  Temp: 98.1 F (36.7 C)  SpO2: 98%  Weight: 202 lb (91.6 kg)  Height: '5\' 8"'$  (1.727 m)  PainSc: 0-No pain   Body mass index is 30.71 kg/m.  Wt Readings from Last 3 Encounters:  05/13/22 202 lb (91.6 kg)  04/17/22 202 lb 6.4 oz (91.8 kg)  03/18/22 201 lb 9.6 oz (91.4 kg)    Objective:  Physical Exam Vitals reviewed.  Constitutional:      General: She is not in acute distress.    Appearance: Normal appearance.  HENT:     Right Ear: Tympanic membrane, ear canal and external ear normal. There is no impacted cerumen.     Left Ear: Tympanic membrane, ear canal and external ear normal. There is no impacted cerumen.     Nose: No congestion.     Right Sinus: No maxillary sinus tenderness or frontal sinus tenderness.     Left Sinus: No maxillary sinus tenderness or frontal sinus tenderness.     Comments: Ethmoid sinus tenderness Cardiovascular:     Rate and Rhythm: Normal rate and regular rhythm.     Pulses: Normal pulses.     Heart sounds: Normal heart sounds. No murmur heard. Pulmonary:     Effort: Pulmonary effort is normal. No respiratory distress.     Breath sounds: Normal breath sounds. No wheezing.  Skin:    General: Skin is warm and dry.     Capillary Refill: Capillary refill takes less than 2 seconds.  Neurological:     General: No focal deficit present.     Mental Status: She is alert and oriented to person, place, and time.     Cranial Nerves: No cranial nerve deficit.     Motor: No weakness.  Psychiatric:        Mood and Affect: Mood normal.        Behavior: Behavior normal.        Thought Content: Thought content normal.        Judgment: Judgment normal.         Assessment And Plan:     1. Sinus pressure Comments: Discussed importance of not taking old antibiotics to prevent resistance, tenderness to ethmoid sinuses. Negative rapid covid - amoxicillin-clavulanate (AUGMENTIN) 875-125 MG tablet; Take 1 tablet by mouth 2 (two) times daily.  Dispense:  20 tablet; Refill: 0  2. Acute sore throat - POC COVID-19     Patient was given opportunity to ask questions. Patient verbalized understanding of the plan and was able to repeat key elements of the plan. All questions were answered to their satisfaction.  [name], FNP   I, [name], FNP, have reviewed all documentation for this visit. The documentation on 05/13/22 for the exam, diagnosis, procedures, and orders are all accurate and complete.  IF YOU HAVE BEEN REFERRED TO A SPECIALIST, IT MAY TAKE 1-2 WEEKS TO SCHEDULE/PROCESS THE REFERRAL. IF YOU HAVE NOT HEARD FROM US/SPECIALIST IN TWO WEEKS, PLEASE GIVE [place] A CALL AT [phone] X [phone].   THE PATIENT IS ENCOURAGED TO PRACTICE SOCIAL DISTANCING DUE TO THE COVID-19 PANDEMIC.

## 2022-05-26 ENCOUNTER — Emergency Department (HOSPITAL_COMMUNITY): Payer: Medicare Other

## 2022-05-26 ENCOUNTER — Emergency Department (HOSPITAL_COMMUNITY)
Admission: EM | Admit: 2022-05-26 | Discharge: 2022-05-26 | Disposition: A | Discharge status: Home / Self Care | Payer: Medicare Other | Attending: Emergency Medicine | Admitting: Emergency Medicine

## 2022-05-26 ENCOUNTER — Encounter (HOSPITAL_COMMUNITY): Payer: Self-pay

## 2022-05-26 ENCOUNTER — Other Ambulatory Visit: Payer: Self-pay

## 2022-05-26 DIAGNOSIS — I6782 Cerebral ischemia: Secondary | ICD-10-CM | POA: Diagnosis not present

## 2022-05-26 DIAGNOSIS — M436 Torticollis: Secondary | ICD-10-CM

## 2022-05-26 DIAGNOSIS — I10 Essential (primary) hypertension: Secondary | ICD-10-CM | POA: Diagnosis not present

## 2022-05-26 DIAGNOSIS — R202 Paresthesia of skin: Secondary | ICD-10-CM | POA: Diagnosis not present

## 2022-05-26 DIAGNOSIS — M542 Cervicalgia: Secondary | ICD-10-CM | POA: Diagnosis not present

## 2022-05-26 DIAGNOSIS — I639 Cerebral infarction, unspecified: Secondary | ICD-10-CM | POA: Diagnosis not present

## 2022-05-26 DIAGNOSIS — J323 Chronic sphenoidal sinusitis: Secondary | ICD-10-CM | POA: Diagnosis not present

## 2022-05-26 LAB — DIFFERENTIAL
Abs Immature Granulocytes: 0.01 10*3/uL (ref 0.00–0.07)
Basophils Absolute: 0 10*3/uL (ref 0.0–0.1)
Basophils Relative: 1 %
Eosinophils Absolute: 0.2 10*3/uL (ref 0.0–0.5)
Eosinophils Relative: 4 %
Immature Granulocytes: 0 %
Lymphocytes Relative: 55 %
Lymphs Abs: 3.4 10*3/uL (ref 0.7–4.0)
Monocytes Absolute: 0.6 10*3/uL (ref 0.1–1.0)
Monocytes Relative: 10 %
Neutro Abs: 1.8 10*3/uL (ref 1.7–7.7)
Neutrophils Relative %: 30 %

## 2022-05-26 LAB — CBC
HCT: 42.3 % (ref 36.0–46.0)
Hemoglobin: 13.9 g/dL (ref 12.0–15.0)
MCH: 29.1 pg (ref 26.0–34.0)
MCHC: 32.9 g/dL (ref 30.0–36.0)
MCV: 88.7 fL (ref 80.0–100.0)
Platelets: 316 10*3/uL (ref 150–400)
RBC: 4.77 MIL/uL (ref 3.87–5.11)
RDW: 13.7 % (ref 11.5–15.5)
WBC: 6.2 10*3/uL (ref 4.0–10.5)
nRBC: 0 % (ref 0.0–0.2)

## 2022-05-26 LAB — COMPREHENSIVE METABOLIC PANEL
ALT: 15 U/L (ref 0–44)
AST: 17 U/L (ref 15–41)
Albumin: 4 g/dL (ref 3.5–5.0)
Alkaline Phosphatase: 51 U/L (ref 38–126)
Anion gap: 11 (ref 5–15)
BUN: 11 mg/dL (ref 8–23)
CO2: 22 mmol/L (ref 22–32)
Calcium: 10 mg/dL (ref 8.9–10.3)
Chloride: 105 mmol/L (ref 98–111)
Creatinine, Ser: 1.14 mg/dL — ABNORMAL HIGH (ref 0.44–1.00)
GFR, Estimated: 54 mL/min — ABNORMAL LOW (ref >60)
Glucose, Bld: 105 mg/dL — ABNORMAL HIGH (ref 70–99)
Potassium: 3.5 mmol/L (ref 3.5–5.1)
Sodium: 138 mmol/L (ref 135–145)
Total Bilirubin: 0.7 mg/dL (ref 0.3–1.2)
Total Protein: 7.5 g/dL (ref 6.5–8.1)

## 2022-05-26 LAB — APTT: aPTT: 29 seconds (ref 24–36)

## 2022-05-26 LAB — ETHANOL: Alcohol, Ethyl (B): <10 mg/dL (ref <10)

## 2022-05-26 LAB — PROTIME-INR
INR: 1 (ref 0.8–1.2)
Prothrombin Time: 12.8 seconds (ref 11.4–15.2)

## 2022-05-26 MED ORDER — SODIUM CHLORIDE 0.9% FLUSH: 3.0000 mL | Freq: Once | INTRAVENOUS | Status: DC

## 2022-05-26 NOTE — ED Triage Notes (Signed)
Pt reports pain in the left side of her neck that started around midnight last night that woke her out of her sleep. Pt also reports left sided facial and left hand tingling that she felt when she woke up this morning around 3am. Pt has hx of stroke about 5 years ago, does not take any blood thinners. No neuro deficits noted in triage at this time.

## 2022-05-26 NOTE — ED Provider Notes (Signed)
[facility] Provider Note   CSN: [id number] Arrival date & time: 05/26/22  0743     History  Chief Complaint  Patient presents with   Neck Pain    Brittany Crawford is a 66 y.o. female.  66 year old female awoke with left-sided neck pain that woke her from sleep.  Patient states the pain is worse with movement.  Does not radiate down her left arm.  Had some paresthesias to the left side of her lower face.  Denies any trouble closing her eyes.  No focal weakness.  Pain is positional and worse with standing still.  History of CVA in the past and this does not feel like that.  No treatment use prior to arrival.       Home Medications Prior to Admission medications   Medication Sig Start Date End Date Taking? Authorizing Provider  amoxicillin-clavulanate (AUGMENTIN) 875-125 MG tablet Take 1 tablet by mouth 2 (two) times daily. 05/13/22   [name], FNP  Ascorbic Acid (VITAMIN C) 1000 MG tablet Take 1,000 mg by mouth daily. Takes when coming down with a cold.    [provider]  azelastine (OPTIVAR) 0.05 % ophthalmic solution SMARTSIG:1 Drop(s) In Eye(s) Twice Daily PRN 09/28/20   [provider]  dapagliflozin propanediol (FARXIGA) 10 MG TABS tablet TAKE 1 TABLET(10 MG) BY MOUTH DAILY BEFORE BREAKFAST 04/07/22   [name], FNP  diclofenac sodium (VOLTAREN) 1 % GEL Apply 2 g topically 4 (four) times daily. 08/16/18   Meredith Staggers, MD  HYDROcodone-acetaminophen (NORCO) 7.5-325 MG tablet Take 1 tablet by mouth every 8 (eight) hours as needed for moderate pain. Do Not Fill Before 05/17/2022 04/17/22   Bayard Hugger, NP  Hypromellose (ARTIFICIAL TEARS OP) Place 1 drop into the right eye daily as needed (for dry eyes).    [provider]  latanoprost (XALATAN) 0.005 % ophthalmic solution 1 drop at bedtime. 12/24/21   [provider]  losartan-hydrochlorothiazide (HYZAAR) 100-12.5 MG tablet Take 1 tablet by  mouth daily. 10/30/21   [name], FNP  Multiple Vitamin (MULTIVITAMIN WITH MINERALS) TABS tablet Take 1 tablet by mouth daily.    [provider]  Olopatadine HCl 0.2 % SOLN Apply 1 drop to eye daily as needed for allergies. 06/18/17   [provider]  phenazopyridine (PYRIDIUM) 100 MG tablet Take 1 tablet (100 mg total) by mouth 3 (three) times daily as needed for pain. 03/18/22   [name], FNP  prednisoLONE acetate (PRED FORTE) 1 % ophthalmic suspension Place 1 drop into the left eye as needed (flare ups).     [provider]  sulfamethoxazole-trimethoprim (BACTRIM DS) 800-160 MG tablet Take 1 tablet by mouth 2 (two) times daily. 03/18/22   [name], FNP  triamcinolone cream (KENALOG) 0.1 % Apply 1 application topically at bedtime as needed (for rash). 01/05/20   [name], FNP      Allergies    Gadobenate and Ivp dye [iodinated contrast media]    Review of Systems   Review of Systems  All other systems reviewed and are negative.   Physical Exam Updated Vital Signs BP (!) 138/94   Pulse 79   Temp 99 F (37.2 C) (Oral)   Resp 16   Ht 1.727 m ('5\' 8"'$ )   Wt 90.7 kg   LMP 11/13/2012   SpO2 96%   BMI 30.41 kg/m  Physical Exam Vitals and nursing note reviewed.  Constitutional:      General:  She is not in acute distress.    Appearance: Normal appearance. She is well-developed. She is not toxic-appearing.  HENT:     Head: Normocephalic and atraumatic.  Eyes:     General: Lids are normal.     Conjunctiva/sclera: Conjunctivae normal.     Pupils: Pupils are equal, round, and reactive to light.  Neck:     Thyroid: No thyroid mass.     Trachea: No tracheal deviation.      Comments: Pinpoint tenderness at left posterior neck. Cardiovascular:     Rate and Rhythm: Normal rate and regular rhythm.     Heart sounds: Normal heart sounds. No murmur heard.    No gallop.  Pulmonary:     Effort: Pulmonary effort is normal. No respiratory distress.      Breath sounds: Normal breath sounds. No stridor. No decreased breath sounds, wheezing, rhonchi or rales.  Abdominal:     General: There is no distension.     Palpations: Abdomen is soft.     Tenderness: There is no abdominal tenderness. There is no rebound.  Musculoskeletal:        General: No tenderness. Normal range of motion.     Cervical back: Normal range of motion and neck supple.  Skin:    General: Skin is warm and dry.     Findings: No abrasion or rash.  Neurological:     General: No focal deficit present.     Mental Status: She is alert and oriented to person, place, and time. Mental status is at baseline.     GCS: GCS eye subscore is 4. GCS verbal subscore is 5. GCS motor subscore is 6.     Cranial Nerves: No cranial nerve deficit.     Sensory: No sensory deficit.     Motor: Motor function is intact.  Psychiatric:        Attention and Perception: Attention normal.        Speech: Speech normal.        Behavior: Behavior normal.     ED Results / Procedures / Treatments   Labs (all labs ordered are listed, but only abnormal results are displayed) Labs Reviewed  COMPREHENSIVE METABOLIC PANEL - Abnormal; Notable for the following components:      Result Value   Glucose, Bld 105 (*)    Creatinine, Ser 1.14 (*)    GFR, Estimated 54 (*)    All other components within normal limits  PROTIME-INR  APTT  CBC  DIFFERENTIAL  ETHANOL    EKG EKG Interpretation  Date/Time:  Monday May 26 2022 07:54:24 EDT Ventricular Rate:  84 PR Interval:  142 QRS Duration: 82 QT Interval:  348 QTC Calculation: 411 R Axis:   35 Text Interpretation: Normal sinus rhythm Normal ECG When compared with ECG of 16-Apr-2017 08:43, PREVIOUS ECG IS PRESENT Confirmed by Lacretia Leigh ([id number]) on 05/26/2022 9:28:38 AM  Radiology CT HEAD WO CONTRAST  Result Date: 05/26/2022 CLINICAL DATA:  Neuro deficit, acute, stroke suspected EXAM: CT HEAD WITHOUT CONTRAST TECHNIQUE: Contiguous axial images  were obtained from the base of the skull through the vertex without intravenous contrast. RADIATION DOSE REDUCTION: This exam was performed according to the departmental dose-optimization program which includes automated exposure control, adjustment of the mA and/or kV according to patient size and/or use of iterative reconstruction technique. COMPARISON:  2015 FINDINGS: Brain: There is no acute intracranial hemorrhage, mass effect, or edema. Gray-white differentiation is preserved. There is no extra-axial fluid collection. Ventricles and sulci  are within normal limits in size and configuration. Small chronic right thalamic infarct. Additional patchy low-density in the supratentorial white matter is nonspecific but may reflect mild chronic microvascular ischemic changes. Vascular: There is atherosclerotic calcification at the skull base. Skull: Calvarium is unremarkable. Sinuses/Orbits: Chronic left sphenoid sinusitis. Stable oblong configuration of the left globe. Other: Mastoid air cells are clear. IMPRESSION: No acute intracranial hemorrhage or evidence of acute infarction. Chronic microvascular ischemic changes. Small chronic right thalamic infarct. Electronically Signed   By: Macy Mis M.D.   On: 05/26/2022 09:08    Procedures Procedures    Medications Ordered in ED Medications  sodium chloride flush (NS) 0.9 % injection 3 mL (has no administration in time range)    ED Course/ Medical Decision Making/ A&P                           Medical Decision Making Amount and/or Complexity of Data Reviewed Labs: ordered. Radiology: ordered.   The patient is EKG per interpretation shows no acute findings.  She is in normal sinus rhythm.  No evidence of ACS.  Patient pinpoint tender at her upper trapezius muscle insertion.  Do not feel that she has serious etiology such as carotid dissection.  Patient had a head CT per my interpretation which was negative for acute ischemic changes.  She has no  focal neurological deficits on exam.  She has normal sensory exam in her face as well as extremities.  Low suspicion for neurological process.  Labs are reassuring here with normal white blood cell count. Suspect that patient has musculoskeletal strain.  Patient is under pain management and is on chronic opiates.  Encourage patient to follow-up with her doctor for further pain management.  Advised her to use ice as well as heat therapy.  Return precautions given.       Final Clinical Impression(s) / ED Diagnoses Final diagnoses:  None    Rx / DC Orders ED Discharge Orders     None         Lacretia Leigh, MD 05/26/22 [phone]

## 2022-06-05 ENCOUNTER — Telehealth: Payer: Self-pay

## 2022-06-05 NOTE — Telephone Encounter (Signed)
Transition Care Management Follow-up Telephone Call Date of discharge and from where: 05/26/2022 [facility]  How have you been since you were released from the hospital? Pt states she does feel a lot better. She does not want a follow up appt at this time.   Any questions or concerns? No   Items Reviewed: Did the pt receive and understand the discharge instructions provided? Yes  Medications obtained and verified? Yes  Other? No  Any new allergies since your discharge? No  Dietary orders reviewed? Yes Do you have support at home? Yes   Home Care and Equipment/Supplies: Were home health services ordered? no If so, what is the name of the agency? N/a  Has the agency set up a time to come to the patient's home? no Were any new equipment or medical supplies ordered?  No What is the name of the medical supply agency? N/a Were you able to get the supplies/equipment? no Do you have any questions related to the use of the equipment or supplies? No  Functional Questionnaire: (I = Independent and D = Dependent) ADLs: i  Bathing/Dressing- i  Meal Prep- i  Eating- i  Maintaining continence- i  Transferring/Ambulation- i  Managing Meds- i  Follow up appointments reviewed:  PCP Hospital f/u appt confirmed? No  Scheduled to see n/a on n/a @ n/a. [facility] f/u appt confirmed? No  Scheduled to see n/a on n/a @ n/a. Are transportation arrangements needed? No  If their condition worsens, is the pt aware to call PCP or go to the Emergency Dept.? Yes Was the patient provided with contact information for the PCP's office or ED? Yes Was to pt encouraged to call back with questions or concerns? Yes

## 2022-06-09 DIAGNOSIS — H5712 Ocular pain, left eye: Secondary | ICD-10-CM | POA: Diagnosis not present

## 2022-06-09 DIAGNOSIS — H40023 Open angle with borderline findings, high risk, bilateral: Secondary | ICD-10-CM | POA: Diagnosis not present

## 2022-06-13 ENCOUNTER — Encounter: Payer: Medicare Other | Attending: Registered Nurse | Admitting: Registered Nurse

## 2022-06-13 ENCOUNTER — Encounter: Payer: Self-pay | Admitting: Registered Nurse

## 2022-06-13 VITALS — BP 131/87 | HR 77 | Ht 68.0 in | Wt 201.2 lb

## 2022-06-13 DIAGNOSIS — M47816 Spondylosis without myelopathy or radiculopathy, lumbar region: Secondary | ICD-10-CM | POA: Diagnosis not present

## 2022-06-13 DIAGNOSIS — Z5181 Encounter for therapeutic drug level monitoring: Secondary | ICD-10-CM | POA: Diagnosis not present

## 2022-06-13 DIAGNOSIS — M542 Cervicalgia: Secondary | ICD-10-CM | POA: Diagnosis not present

## 2022-06-13 DIAGNOSIS — Z79891 Long term (current) use of opiate analgesic: Secondary | ICD-10-CM | POA: Insufficient documentation

## 2022-06-13 DIAGNOSIS — G894 Chronic pain syndrome: Secondary | ICD-10-CM | POA: Insufficient documentation

## 2022-06-13 MED ORDER — HYDROCODONE-ACETAMINOPHEN 7.5-325 MG PO TABS: 1.0000 | ORAL_TABLET | Freq: Three times a day (TID) | ORAL | 0 refills | Status: DC | PRN

## 2022-06-13 NOTE — Progress Notes (Signed)
Subjective:    Patient ID: Brittany Crawford, female    DOB: 12/16/1955, 66 y.o.   MRN: [id number]  HPI: Brittany Crawford is a 66 y.o. female who returns for follow up appointment for chronic pain and medication refill. She states her pain is located in her neck, mainly left side and lower back pain. She rates her pain 6. Her current exercise regime is walking and performing stretching exercises.  Brittany Crawford was seen in the emergency room on 05/26/2022 for neck pain. Note was reviewed.   Brittany Crawford Morphine equivalent is 22.50  MME.   UDS ordered today.     Pain Inventory Average Pain 8 Pain Right Now 6 My pain is constant, dull, and aching  In the last 24 hours, has pain interfered with the following? General activity 6 Relation with others 6 Enjoyment of life 9 What TIME of day is your pain at its worst? morning  and evening Sleep (in general) Good  Pain is worse with: bending, standing, and some activites Pain improves with: rest, heat/ice, therapy/exercise, and medication Relief from Meds: 9  Family History  Problem Relation Age of Onset   Stroke Mother    Colon cancer Maternal Uncle    Breast cancer Neg Hx    Social History   Socioeconomic History   Marital status: Married    Spouse name: Not on file   Number of children: 2   Years of education: 12   Highest education level: Not on file  Occupational History   Occupation: disability  Tobacco Use   Smoking status: Former    Packs/day: 0.00    Years: 0.00    Total pack years: 0.00    Types: Cigarettes    Quit date: 03/13/2017    Years since quitting: 5.2   Smokeless tobacco: Never   Tobacco comments:    4  to 5  cigarettes daily for years  Vaping Use   Vaping Use: Never used  Substance and Sexual Activity   Alcohol use: Not Currently    Comment: occassionally   Drug use: Yes    Types: Hydrocodone   Sexual activity: Not Currently  Other Topics Concern   Not on file  Social History Narrative    Patient is married with 2 children.   Patient is right handed.   Patient has hs education.   Patient drinks 4 cups daily.   Social Determinants of Health   Financial Resource Strain: Low Risk  (07/24/2021)   Overall Financial Resource Strain (CARDIA)    Difficulty of Paying Living Expenses: Not hard at all  Food Insecurity: No Food Insecurity (07/24/2021)   Hunger Vital Sign    Worried About Running Out of Food in the Last Year: Never true    Ran Out of Food in the Last Year: Never true  Transportation Needs: No Transportation Needs (07/24/2021)   PRAPARE - Hydrologist (Medical): No    Lack of Transportation (Non-Medical): No  Physical Activity: Insufficiently Active (07/24/2021)   Exercise Vital Sign    Days of Exercise per Week: 2 days    Minutes of Exercise per Session: 10 min  Stress: No Stress Concern Present (07/24/2021)   [place]    Feeling of Stress : Not at all  Social Connections: Not on file   Past Surgical History:  Procedure Laterality Date   BREAST BIOPSY Left 04/29/2016   BREAST BIOPSY Left 04/23/2016  DILATATION & CURRETTAGE/HYSTEROSCOPY WITH RESECTOCOPE N/A 12/10/2012   Procedure: DILATATION & CURETTAGE/HYSTEROSCOPY WITH RESECTOCOPE;  Surgeon: Marvene Staff, MD;  Location: [facility];  Service: Gynecology;  Laterality: N/A;   FOOT SURGERY     left-pins placed   LYMPH NODE BIOPSY     POLYPECTOMY N/A 12/10/2012   Procedure: POLYPECTOMY;  Surgeon: Marvene Staff, MD;  Location: [facility];  Service: Gynecology;  Laterality: N/A;   STRABISMUS SURGERY Left 04/17/2017   Procedure: REPAIR STRABISMUS LEFT EYE;  Surgeon: Everitt Amber, MD;  Location: [facility];  Service: Ophthalmology;  Laterality: Left;   Past Surgical History:  Procedure Laterality Date   BREAST BIOPSY Left 04/29/2016   BREAST BIOPSY Left 04/23/2016   DILATATION &  CURRETTAGE/HYSTEROSCOPY WITH RESECTOCOPE N/A 12/10/2012   Procedure: DILATATION & CURETTAGE/HYSTEROSCOPY WITH RESECTOCOPE;  Surgeon: Marvene Staff, MD;  Location: [facility];  Service: Gynecology;  Laterality: N/A;   FOOT SURGERY     left-pins placed   LYMPH NODE BIOPSY     POLYPECTOMY N/A 12/10/2012   Procedure: POLYPECTOMY;  Surgeon: Marvene Staff, MD;  Location: [facility];  Service: Gynecology;  Laterality: N/A;   STRABISMUS SURGERY Left 04/17/2017   Procedure: REPAIR STRABISMUS LEFT EYE;  Surgeon: Everitt Amber, MD;  Location: [facility];  Service: Ophthalmology;  Laterality: Left;   Past Medical History:  Diagnosis Date   Chronic back pain    H/O sarcoidosis    Hypertension    Seasonal allergies    Stroke (HCC)    BP 131/87   Pulse 77   Ht '5\' 8"'$  (1.727 m)   Wt 201 lb 3.2 oz (91.3 kg)   LMP 11/13/2012   SpO2 97%   BMI 30.59 kg/m   Opioid Risk Score:   Fall Risk Score:  `1  Depression screen [facility] 2/9     06/13/2022    9:23 AM 04/17/2022   10:26 AM 12/17/2021   10:35 AM 10/22/2021   10:07 AM 08/13/2021   10:56 AM 07/24/2021   12:01 PM 04/12/2021   11:04 AM  Depression screen PHQ 2/9  Decreased Interest 0 0 0 0 0 0 0  Down, Depressed, Hopeless 0 0 0 0 0 0 0  PHQ - 2 Score 0 0 0 0 0 0 0     Review of Systems  Constitutional: Negative.   HENT: Negative.    Eyes: Negative.   Respiratory: Negative.    Cardiovascular: Negative.   Gastrointestinal: Negative.   Endocrine: Negative.   Genitourinary: Negative.   Musculoskeletal: Negative.   Skin: Negative.   Allergic/Immunologic: Negative.   Neurological: Negative.   Hematological: Negative.   Psychiatric/Behavioral: Negative.        Objective:   Physical Exam Vitals and nursing note reviewed.  Constitutional:      Appearance: Normal appearance.  Neck:     Comments: Cervical Paraspinal Tenderness: C-4-C-6 Mainly left side  Cardiovascular:     Rate and Rhythm: Normal rate and regular rhythm.      Pulses: Normal pulses.     Heart sounds: Normal heart sounds.  Pulmonary:     Effort: Pulmonary effort is normal.     Breath sounds: Normal breath sounds.  Musculoskeletal:     Cervical back: Normal range of motion and neck supple.     Comments: Normal Muscle Bulk and Muscle Testing Reveals:  Upper Extremities: Full ROM and Muscle Strength 5/5 Lumbar Paraspinal Tenderness: L-4-L-5 Lower Extremities: Full ROM and Muscle Strength 5/5 Arises  from Table with Ease Narrow Based  Gait     Skin:    General: Skin is warm and dry.  Neurological:     Mental Status: She is alert and oriented to person, place, and time.  Psychiatric:        Mood and Affect: Mood normal.        Behavior: Behavior normal.         Assessment & Plan:  1. Lumbar spondylosis with DDD and facet arthropathy.Left Lumbar Radiculitis: Continue HEP as Tolerated. Continue to Monitor.  06/13/2022 Refilled: Hydrocodone 7.5 /325 mg one tablet every 8 hours as needed for pain. Increased to  #90 tablets. . Second script e-scribe for the following month. We will continue the opioid monitoring program, this consists of regular clinic visits, examinations, urine drug screen, pill counts as well as use of [place] Controlled Substance Reporting system. A 12 month History has been reviewed on the [place] on 06/13/2022. 2. Bilateral knee pain: No complaints voiced today: Continue with heat/ice, exercise and Diclofenac. 06/13/2022 3. Right thalamic/internal capsule lacunar infarct with persistent left hemisensory deficits: Neurology Following. Continue to Monitor. 06/13/2022. 4.Left Greater Trochanteric Bursitis: no complaints today. Continue HEP as Tolerated and Alternate Ice and Heat Therapy.  Continue current medication regime. Continue to Monitor.  06/13/2022. 5. Polyarthralgia: Continue HEP as Tolerated. Continue to monitor. 06/13/2022 6. Cervicalgia: Was seen in ED, Note was  reviewed. Continue to alternate with heat and Ice therapy. Continue to Monitor.     F/U in 2 months

## 2022-06-19 LAB — TOXASSURE SELECT,+ANTIDEPR,UR

## 2022-06-20 ENCOUNTER — Telehealth: Payer: Self-pay | Admitting: *Deleted

## 2022-06-20 NOTE — Telephone Encounter (Signed)
Urine drug screen for this encounter is consistent for prescribed medication 

## 2022-06-23 NOTE — Progress Notes (Signed)
Labs were ordered as part of a protocol

## 2022-07-15 ENCOUNTER — Telehealth: Payer: Self-pay

## 2022-07-15 NOTE — Telephone Encounter (Signed)
Called patient for 10 year recall screening colonoscopy due 06/2022.  NALM to call and schedule at earliest convenience.

## 2022-07-22 ENCOUNTER — Other Ambulatory Visit: Payer: Self-pay | Admitting: Nurse Practitioner

## 2022-07-22 DIAGNOSIS — Z1231 Encounter for screening mammogram for malignant neoplasm of breast: Secondary | ICD-10-CM

## 2022-07-31 ENCOUNTER — Ambulatory Visit (INDEPENDENT_AMBULATORY_CARE_PROVIDER_SITE_OTHER): Payer: Medicare Other | Admitting: Nurse Practitioner

## 2022-07-31 ENCOUNTER — Encounter: Payer: Self-pay | Admitting: Gastroenterology

## 2022-07-31 ENCOUNTER — Ambulatory Visit (INDEPENDENT_AMBULATORY_CARE_PROVIDER_SITE_OTHER): Payer: Medicare Other

## 2022-07-31 ENCOUNTER — Encounter: Payer: Self-pay | Admitting: Nurse Practitioner

## 2022-07-31 VITALS — BP 132/80 | HR 84 | Temp 98.7°F | Ht 68.0 in | Wt 199.0 lb

## 2022-07-31 DIAGNOSIS — Z1211 Encounter for screening for malignant neoplasm of colon: Secondary | ICD-10-CM | POA: Diagnosis not present

## 2022-07-31 DIAGNOSIS — N1831 Chronic kidney disease, stage 3a: Secondary | ICD-10-CM

## 2022-07-31 DIAGNOSIS — Z23 Encounter for immunization: Secondary | ICD-10-CM | POA: Diagnosis not present

## 2022-07-31 DIAGNOSIS — Z6830 Body mass index (BMI) 30.0-30.9, adult: Secondary | ICD-10-CM

## 2022-07-31 DIAGNOSIS — Z2821 Immunization not carried out because of patient refusal: Secondary | ICD-10-CM

## 2022-07-31 DIAGNOSIS — I129 Hypertensive chronic kidney disease with stage 1 through stage 4 chronic kidney disease, or unspecified chronic kidney disease: Secondary | ICD-10-CM

## 2022-07-31 DIAGNOSIS — N183 Chronic kidney disease, stage 3 unspecified: Secondary | ICD-10-CM

## 2022-07-31 DIAGNOSIS — E782 Mixed hyperlipidemia: Secondary | ICD-10-CM | POA: Diagnosis not present

## 2022-07-31 DIAGNOSIS — E1122 Type 2 diabetes mellitus with diabetic chronic kidney disease: Secondary | ICD-10-CM | POA: Diagnosis not present

## 2022-07-31 DIAGNOSIS — Z0000 Encounter for general adult medical examination without abnormal findings: Secondary | ICD-10-CM

## 2022-07-31 DIAGNOSIS — E6609 Other obesity due to excess calories: Secondary | ICD-10-CM

## 2022-07-31 LAB — LIPID PANEL
Chol/HDL Ratio: 4.1 ratio (ref 0.0–4.4)
Cholesterol, Total: 221 mg/dL — ABNORMAL HIGH (ref 100–199)
HDL: 54 mg/dL (ref >39)
LDL Chol Calc (NIH): 129 mg/dL — ABNORMAL HIGH (ref 0–99)
Triglycerides: 214 mg/dL — ABNORMAL HIGH (ref 0–149)
VLDL Cholesterol Cal: 38 mg/dL (ref 5–40)

## 2022-07-31 LAB — HEMOGLOBIN A1C
Est. average glucose Bld gHb Est-mCnc: 131 mg/dL
Hgb A1c MFr Bld: 6.2 % — ABNORMAL HIGH (ref 4.8–5.6)

## 2022-07-31 MED ORDER — DAPAGLIFLOZIN PROPANEDIOL 10 MG PO TABS: ORAL_TABLET | ORAL | 2 refills | Status: DC

## 2022-07-31 MED ORDER — BLOOD GLUCOSE MONITOR KIT: PACK | 0 refills | Status: DC

## 2022-07-31 NOTE — Patient Instructions (Addendum)
Hypertension, Adult High blood pressure (hypertension) is when the force of blood pumping through the arteries is too strong. The arteries are the blood vessels that carry blood from the heart throughout the body. Hypertension forces the heart to work harder to pump blood and may cause arteries to become narrow or stiff. Untreated or uncontrolled hypertension can lead to a heart attack, heart failure, a stroke, kidney disease, and other problems. A blood pressure reading consists of a higher number over a lower number. Ideally, your blood pressure should be below 120/80. The first ("top") number is called the systolic pressure. It is a measure of the pressure in your arteries as your heart beats. The second ("bottom") number is called the diastolic pressure. It is a measure of the pressure in your arteries as the heart relaxes. What are the causes? The exact cause of this condition is not known. There are some conditions that result in high blood pressure. What increases the risk? Certain factors may make you more likely to develop high blood pressure. Some of these risk factors are under your control, including: Smoking. Not getting enough exercise or physical activity. Being overweight. Having too much fat, sugar, calories, or salt (sodium) in your diet. Drinking too much alcohol. Other risk factors include: Having a personal history of heart disease, diabetes, high cholesterol, or kidney disease. Stress. Having a family history of high blood pressure and high cholesterol. Having obstructive sleep apnea. Age. The risk increases with age. What are the signs or symptoms? High blood pressure may not cause symptoms. Very high blood pressure (hypertensive crisis) may cause: Headache. Fast or irregular heartbeats (palpitations). Shortness of breath. Nosebleed. Nausea and vomiting. Vision changes. Severe chest pain, dizziness, and seizures. How is this diagnosed? This condition is diagnosed by  measuring your blood pressure while you are seated, with your arm resting on a flat surface, your legs uncrossed, and your feet flat on the floor. The cuff of the blood pressure monitor will be placed directly against the skin of your upper arm at the level of your heart. Blood pressure should be measured at least twice using the same arm. Certain conditions can cause a difference in blood pressure between your right and left arms. If you have a high blood pressure reading during one visit or you have normal blood pressure with other risk factors, you may be asked to: Return on a different day to have your blood pressure checked again. Monitor your blood pressure at home for 1 week or longer. If you are diagnosed with hypertension, you may have other blood or imaging tests to help your health care provider understand your overall risk for other conditions. How is this treated? This condition is treated by making healthy lifestyle changes, such as eating healthy foods, exercising more, and reducing your alcohol intake. You may be referred for counseling on a healthy diet and physical activity. Your health care provider may prescribe medicine if lifestyle changes are not enough to get your blood pressure under control and if: Your systolic blood pressure is above 130. Your diastolic blood pressure is above 80. Your personal target blood pressure may vary depending on your medical conditions, your age, and other factors. Follow these instructions at home: Eating and drinking  Eat a diet that is high in fiber and potassium, and low in sodium, added sugar, and fat. An example of this eating plan is called the DASH diet. DASH stands for Dietary Approaches to Stop Hypertension. To eat this way: Eat   plenty of fresh fruits and vegetables. Try to fill one half of your plate at each meal with fruits and vegetables. Eat whole grains, such as whole-wheat pasta, brown rice, or whole-grain bread. Fill about one  fourth of your plate with whole grains. Eat or drink low-fat dairy products, such as skim milk or low-fat yogurt. Avoid fatty cuts of meat, processed or cured meats, and poultry with skin. Fill about one fourth of your plate with lean proteins, such as fish, chicken without skin, beans, eggs, or tofu. Avoid pre-made and processed foods. These tend to be higher in sodium, added sugar, and fat. Reduce your daily sodium intake. Many people with hypertension should eat less than 1,500 mg of sodium a day. Do not drink alcohol if: Your health care provider tells you not to drink. You are pregnant, may be pregnant, or are planning to become pregnant. If you drink alcohol: Limit how much you have to: 0-1 drink a day for women. 0-2 drinks a day for men. Know how much alcohol is in your drink. In the [place] one drink equals one 12 oz bottle of beer (355 mL), one 5 oz glass of wine (148 mL), or one 1 oz glass of hard liquor (44 mL). Lifestyle  Work with your health care provider to maintain a healthy body weight or to lose weight. Ask what an ideal weight is for you. Get at least 30 minutes of exercise that causes your heart to beat faster (aerobic exercise) most days of the week. Activities may include walking, swimming, or biking. Include exercise to strengthen your muscles (resistance exercise), such as Pilates or lifting weights, as part of your weekly exercise routine. Try to do these types of exercises for 30 minutes at least 3 days a week. Do not use any products that contain nicotine or tobacco. These products include cigarettes, chewing tobacco, and vaping devices, such as e-cigarettes. If you need help quitting, ask your health care provider. Monitor your blood pressure at home as told by your health care provider. Keep all follow-up visits. This is important. Medicines Take over-the-counter and prescription medicines only as told by your health care provider. Follow directions carefully. Blood  pressure medicines must be taken as prescribed. Do not skip doses of blood pressure medicine. Doing this puts you at risk for problems and can make the medicine less effective. Ask your health care provider about side effects or reactions to medicines that you should watch for. Contact a health care provider if you: Think you are having a reaction to a medicine you are taking. Have headaches that keep coming back (recurring). Feel dizzy. Have swelling in your ankles. Have trouble with your vision. Get help right away if you: Develop a severe headache or confusion. Have unusual weakness or numbness. Feel faint. Have severe pain in your chest or abdomen. Vomit repeatedly. Have trouble breathing. These symptoms may be an emergency. Get help right away. Call 911. Do not wait to see if the symptoms will go away. Do not drive yourself to the hospital. Summary Hypertension is when the force of blood pumping through your arteries is too strong. If this condition is not controlled, it may put you at risk for serious complications. Your personal target blood pressure may vary depending on your medical conditions, your age, and other factors. For most people, a normal blood pressure is less than 120/80. Hypertension is treated with lifestyle changes, medicines, or a combination of both. Lifestyle changes include losing weight, eating a healthy,   low-sodium diet, exercising more, and limiting alcohol. This information is not intended to replace advice given to you by your health care provider. Make sure you discuss any questions you have with your health care provider. Document Revised: 08/06/2021 Document Reviewed: 08/06/2021 Elsevier Patient Education  2023 [employer]  

## 2022-07-31 NOTE — Patient Instructions (Signed)
Brittany Crawford , Thank you for taking time to come for your Medicare Wellness Visit. I appreciate your ongoing commitment to your health goals. Please review the following plan we discussed and let me know if I can assist you in the future.   Screening recommendations/referrals: Colonoscopy: completed 07/05/2012, due 07/05/2022 Mammogram: scheduled for 09/01/2022 Bone Density: n/a Recommended yearly ophthalmology/optometry visit for glaucoma screening and checkup Recommended yearly dental visit for hygiene and checkup  Vaccinations: Influenza vaccine: today Pneumococcal vaccine: n/a Tdap vaccine: completed 03/04/2022, due 03/04/2032 Shingles vaccine: declines second dose  Covid-19:  01/30/2020, 12/30/2019  Advanced directives: Advance directive discussed with you today. Even though you declined this today please call our office should you change your mind and we can give you the proper paperwork for you to fill out.  Conditions/risks identified: none  Next appointment: Follow up in one year for your annual wellness visit.   Preventive Care 40-64 Years, Female Preventive care refers to lifestyle choices and visits with your health care provider that can promote health and wellness. What does preventive care include? A yearly physical exam. This is also called an annual well check. Dental exams once or twice a year. Routine eye exams. Ask your health care provider how often you should have your eyes checked. Personal lifestyle choices, including: Daily care of your teeth and gums. Regular physical activity. Eating a healthy diet. Avoiding tobacco and drug use. Limiting alcohol use. Practicing safe sex. Taking low-dose aspirin daily starting at age 16. Taking vitamin and mineral supplements as recommended by your health care provider. What happens during an annual well check? The services and screenings done by your health care provider during your annual well check will depend on your  age, overall health, lifestyle risk factors, and family history of disease. Counseling  Your health care provider may ask you questions about your: Alcohol use. Tobacco use. Drug use. Emotional well-being. Home and relationship well-being. Sexual activity. Eating habits. Work and work Statistician. Method of birth control. Menstrual cycle. Pregnancy history. Screening  You may have the following tests or measurements: Height, weight, and BMI. Blood pressure. Lipid and cholesterol levels. These may be checked every 5 years, or more frequently if you are over 74 years old. Skin check. Lung cancer screening. You may have this screening every year starting at age 4 if you have a 30-pack-year history of smoking and currently smoke or have quit within the past 15 years. Fecal occult blood test (FOBT) of the stool. You may have this test every year starting at age 41. Flexible sigmoidoscopy or colonoscopy. You may have a sigmoidoscopy every 5 years or a colonoscopy every 10 years starting at age 22. Hepatitis C blood test. Hepatitis B blood test. Sexually transmitted disease (STD) testing. Diabetes screening. This is done by checking your blood sugar (glucose) after you have not eaten for a while (fasting). You may have this done every 1-3 years. Mammogram. This may be done every 1-2 years. Talk to your health care provider about when you should start having regular mammograms. This may depend on whether you have a family history of breast cancer. BRCA-related cancer screening. This may be done if you have a family history of breast, ovarian, tubal, or peritoneal cancers. Pelvic exam and Pap test. This may be done every 3 years starting at age 74. Starting at age 51, this may be done every 5 years if you have a Pap test in combination with an HPV test. Bone density scan. This is  done to screen for osteoporosis. You may have this scan if you are at high risk for osteoporosis. Discuss your test  results, treatment options, and if necessary, the need for more tests with your health care provider. Vaccines  Your health care provider may recommend certain vaccines, such as: Influenza vaccine. This is recommended every year. Tetanus, diphtheria, and acellular pertussis (Tdap, Td) vaccine. You may need a Td booster every 10 years. Zoster vaccine. You may need this after age 23. Pneumococcal 13-valent conjugate (PCV13) vaccine. You may need this if you have certain conditions and were not previously vaccinated. Pneumococcal polysaccharide (PPSV23) vaccine. You may need one or two doses if you smoke cigarettes or if you have certain conditions. Talk to your health care provider about which screenings and vaccines you need and how often you need them. This information is not intended to replace advice given to you by your health care provider. Make sure you discuss any questions you have with your health care provider. Document Released: 10/26/2015 Document Revised: 06/18/2016 Document Reviewed: 07/31/2015 Elsevier Interactive Patient Education  2017 [employer] Prevention in the Home Falls can cause injuries. They can happen to people of all ages. There are many things you can do to make your home safe and to help prevent falls. What can I do on the outside of my home? Regularly fix the edges of walkways and driveways and fix any cracks. Remove anything that might make you trip as you walk through a door, such as a raised step or threshold. Trim any bushes or trees on the path to your home. Use bright outdoor lighting. Clear any walking paths of anything that might make someone trip, such as rocks or tools. Regularly check to see if handrails are loose or broken. Make sure that both sides of any steps have handrails. Any raised decks and porches should have guardrails on the edges. Have any leaves, snow, or ice cleared regularly. Use sand or salt on walking paths during  winter. Clean up any spills in your garage right away. This includes oil or grease spills. What can I do in the bathroom? Use night lights. Install grab bars by the toilet and in the tub and shower. Do not use towel bars as grab bars. Use non-skid mats or decals in the tub or shower. If you need to sit down in the shower, use a plastic, non-slip stool. Keep the floor dry. Clean up any water that spills on the floor as soon as it happens. Remove soap buildup in the tub or shower regularly. Attach bath mats securely with double-sided non-slip rug tape. Do not have throw rugs and other things on the floor that can make you trip. What can I do in the bedroom? Use night lights. Make sure that you have a light by your bed that is easy to reach. Do not use any sheets or blankets that are too big for your bed. They should not hang down onto the floor. Have a firm chair that has side arms. You can use this for support while you get dressed. Do not have throw rugs and other things on the floor that can make you trip. What can I do in the kitchen? Clean up any spills right away. Avoid walking on wet floors. Keep items that you use a lot in easy-to-reach places. If you need to reach something above you, use a strong step stool that has a grab bar. Keep electrical cords out of  the way. Do not use floor polish or wax that makes floors slippery. If you must use wax, use non-skid floor wax. Do not have throw rugs and other things on the floor that can make you trip. What can I do with my stairs? Do not leave any items on the stairs. Make sure that there are handrails on both sides of the stairs and use them. Fix handrails that are broken or loose. Make sure that handrails are as long as the stairways. Check any carpeting to make sure that it is firmly attached to the stairs. Fix any carpet that is loose or worn. Avoid having throw rugs at the top or bottom of the stairs. If you do have throw rugs,  attach them to the floor with carpet tape. Make sure that you have a light switch at the top of the stairs and the bottom of the stairs. If you do not have them, ask someone to add them for you. What else can I do to help prevent falls? Wear shoes that: Do not have high heels. Have rubber bottoms. Are comfortable and fit you well. Are closed at the toe. Do not wear sandals. If you use a stepladder: Make sure that it is fully opened. Do not climb a closed stepladder. Make sure that both sides of the stepladder are locked into place. Ask someone to hold it for you, if possible. Clearly mark and make sure that you can see: Any grab bars or handrails. First and last steps. Where the edge of each step is. Use tools that help you move around (mobility aids) if they are needed. These include: Canes. Walkers. Scooters. Crutches. Turn on the lights when you go into a dark area. Replace any light bulbs as soon as they burn out. Set up your furniture so you have a clear path. Avoid moving your furniture around. If any of your floors are uneven, fix them. If there are any pets around you, be aware of where they are. Review your medicines with your doctor. Some medicines can make you feel dizzy. This can increase your chance of falling. Ask your doctor what other things that you can do to help prevent falls. This information is not intended to replace advice given to you by your health care provider. Make sure you discuss any questions you have with your health care provider. Document Released: 07/26/2009 Document Revised: 03/06/2016 Document Reviewed: 11/03/2014 Elsevier Interactive Patient Education  2017 [employer].

## 2022-07-31 NOTE — Progress Notes (Signed)
I,[name],acting as a Education administrator for [employer], FNP.,have documented all relevant documentation on the behalf of [name], FNP,as directed by  [name], FNP while in the presence of [name], [place].  Subjective:     Patient ID: Brittany Crawford , female    DOB: [birth year] , 66 y.o.   MRN: [id number]   Chief Complaint  Patient presents with   Hypertension    HPI  The patient is here today for a blood pressure f/u.  She is tolerating [place] well.   She is having her AWV done today as well with [facility]  Wt Readings from Last 3 Encounters: 07/31/22 : 199 lb (90.3 kg) 06/13/22 : 201 lb 3.2 oz (91.3 kg) 05/26/22 : 200 lb (90.7 kg)    Hypertension This is a chronic problem. The current episode started more than 1 year ago. The problem is unchanged. The problem is controlled. Pertinent negatives include no anxiety, chest pain, headaches or palpitations. There are no associated agents to hypertension. There are no known risk factors for coronary artery disease. Past treatments include calcium channel blockers and angiotensin blockers. The current treatment provides no improvement. There are no compliance problems.  Hypertensive end-organ damage includes kidney disease and CVA. There is no history of angina. Identifiable causes of hypertension include chronic renal disease.     Past Medical History:  Diagnosis Date   Chronic back pain    H/O sarcoidosis    Hypertension    Seasonal allergies    Stroke [facility])      Family History  Problem Relation Age of Onset   Stroke Mother    Colon cancer Maternal Uncle    Breast cancer Neg Hx      Current Outpatient Medications:    Ascorbic Acid (VITAMIN C) 1000 MG tablet, Take 1,000 mg by mouth daily. Takes when coming down with a cold., Disp: , Rfl:    azelastine (OPTIVAR) 0.05 % ophthalmic solution, SMARTSIG:1 Drop(s) In Eye(s) Twice Daily PRN, Disp: , Rfl:    blood glucose meter kit and supplies KIT, Dispense based on patient  and insurance preference. Use up to four times daily as directed., Disp: 1 each, Rfl: 0   diclofenac sodium (VOLTAREN) 1 % GEL, Apply 2 g topically 4 (four) times daily., Disp: 300 g, Rfl: 2   HYDROcodone-acetaminophen (NORCO) 7.5-325 MG tablet, Take 1 tablet by mouth every 8 (eight) hours as needed for moderate pain. Do Not Fill Before 07/17/2022, Disp: 90 tablet, Rfl: 0   Hypromellose (ARTIFICIAL TEARS OP), Place 1 drop into the right eye daily as needed (for dry eyes)., Disp: , Rfl:    latanoprost (XALATAN) 0.005 % ophthalmic solution, 1 drop at bedtime., Disp: , Rfl:    losartan-hydrochlorothiazide (HYZAAR) 100-12.5 MG tablet, Take 1 tablet by mouth daily., Disp: 90 tablet, Rfl: 1   Multiple Vitamin (MULTIVITAMIN WITH MINERALS) TABS tablet, Take 1 tablet by mouth daily., Disp: , Rfl:    Olopatadine HCl 0.2 % SOLN, Apply 1 drop to eye daily as needed for allergies., Disp: , Rfl: 3   phenazopyridine (PYRIDIUM) 100 MG tablet, Take 1 tablet (100 mg total) by mouth 3 (three) times daily as needed for pain., Disp: 10 tablet, Rfl: 0   prednisoLONE acetate (PRED FORTE) 1 % ophthalmic suspension, Place 1 drop into the left eye as needed (flare ups). , Disp: , Rfl:    dapagliflozin propanediol (FARXIGA) 10 MG TABS tablet, TAKE 1 TABLET(10 MG) BY MOUTH DAILY BEFORE BREAKFAST, Disp: 90 tablet,  Rfl: 2   triamcinolone cream (KENALOG) 0.1 %, Apply 1 Application topically at bedtime as needed (for rash)., Disp: 30 g, Rfl: 1   Allergies  Allergen Reactions   Gadobenate Nausea And Vomiting    Pt was fine after a few minutes , vomiting after contrast injection smills    Ivp Dye [Iodinated Contrast Media]      Review of Systems  Constitutional: Negative.   Respiratory: Negative.    Cardiovascular: Negative.  Negative for chest pain, palpitations and leg swelling.  Gastrointestinal: Negative.   Neurological: Negative.  Negative for headaches.  Psychiatric/Behavioral: Negative.       Today's Vitals    07/31/22 1046  BP: 132/80  Pulse: 84  Temp: 98.7 F (37.1 C)  TempSrc: Oral  Weight: 199 lb (90.3 kg)  Height: '5\' 8"'  (1.727 m)   Body mass index is 30.26 kg/m.  Wt Readings from Last 3 Encounters:  07/31/22 199 lb (90.3 kg)  07/31/22 199 lb (90.3 kg)  06/13/22 201 lb 3.2 oz (91.3 kg)    Objective:  Physical Exam Vitals reviewed.  Constitutional:      Appearance: She is well-developed.  HENT:     Head: Normocephalic and atraumatic.  Eyes:     Pupils: Pupils are equal, round, and reactive to light.  Cardiovascular:     Rate and Rhythm: Normal rate and regular rhythm.     Pulses: Normal pulses.     Heart sounds: Normal heart sounds. No murmur heard. Pulmonary:     Effort: Pulmonary effort is normal.     Breath sounds: Normal breath sounds.  Musculoskeletal:        General: Normal range of motion.  Skin:    General: Skin is warm and dry.     Capillary Refill: Capillary refill takes less than 2 seconds.  Neurological:     General: No focal deficit present.     Mental Status: She is alert and oriented to person, place, and time.     Cranial Nerves: No cranial nerve deficit.  Psychiatric:        Mood and Affect: Mood normal.         Assessment And Plan:     1. Benign hypertension with chronic kidney disease, stage III ([place]) Comments: Blood pressure is fairly controlled, continue current medications.   2. Type 2 diabetes mellitus with stage 3a chronic kidney disease, without long-term current use of insulin ([place]) Comments: HgbA1c is slightly up at last visit. Continue current medications, tolerating well.  - Hemoglobin A1c - dapagliflozin propanediol (FARXIGA) 10 MG TABS tablet; TAKE 1 TABLET(10 MG) BY MOUTH DAILY BEFORE BREAKFAST  Dispense: 90 tablet; Refill: 2 - blood glucose meter kit and supplies KIT; Dispense based on patient and insurance preference. Use up to four times daily as directed.  Dispense: 1 each; Refill: 0  3. Mixed hyperlipidemia Comments:  Discussed further taking a statin and decreasing her risk for heart disease.  - Lipid panel  4. Class 1 obesity due to excess calories with serious comorbidity and body mass index (BMI) of 30.0 to 30.9 in adult Chronic Discussed healthy diet and regular exercise options  Encouraged to exercise at least 150 minutes per week with 2 days of strength training  5. Need for influenza vaccination Influenza vaccine administered Encouraged to take Tylenol as needed for fever or muscle aches. - Flu Vaccine QUAD 6+ mos PF IM (Fluarix Quad PF)  6. Encounter for screening colonoscopy According to USPTF Colorectal cancer Screening guidelines.  Colonoscopy is recommended every 10 years, starting at age 37 years. Will refer to GI for colon cancer screening. - Ambulatory referral to Gastroenterology  7. Herpes zoster vaccination declined Comments: She does not want her 2nd zoster due to side effects.      Patient was given opportunity to ask questions. Patient verbalized understanding of the plan and was able to repeat key elements of the plan. All questions were answered to their satisfaction.  [name], FNP   I, [name], FNP, have reviewed all documentation for this visit. The documentation on 07/31/22 for the exam, diagnosis, procedures, and orders are all accurate and complete.   IF YOU HAVE BEEN REFERRED TO A SPECIALIST, IT MAY TAKE 1-2 WEEKS TO SCHEDULE/PROCESS THE REFERRAL. IF YOU HAVE NOT HEARD FROM US/SPECIALIST IN TWO WEEKS, PLEASE GIVE [place] A CALL AT [phone].   THE PATIENT IS ENCOURAGED TO PRACTICE SOCIAL DISTANCING DUE TO THE COVID-19 PANDEMIC.

## 2022-07-31 NOTE — Progress Notes (Signed)
Subjective:   MALEAHA HUGHETT is a 66 y.o. female who presents for Medicare Annual (Subsequent) preventive examination.  Review of Systems     Cardiac Risk Factors include: dyslipidemia;hypertension;obesity (BMI >30kg/m2)     Objective:    Today's Vitals   07/31/22 1134  BP: 132/80  Pulse: 84  Temp: 98.7 F (37.1 C)  TempSrc: Oral  Weight: 199 lb (90.3 kg)  Height: '5\' 8"'$  (1.727 m)   Body mass index is 30.26 kg/m.     07/31/2022   11:38 AM 04/17/2022   10:26 AM 07/24/2021   11:59 AM 08/30/2020    9:23 AM 04/13/2019    8:57 AM 07/29/2017    1:14 PM 06/10/2017    1:42 PM  Advanced Directives  Does Patient Have a Medical Advance Directive? No No No No No No No  Would patient like information on creating a medical advance directive? No - Patient declined  No - Patient declined No - Patient declined No - Patient declined      Current Medications (verified) Outpatient Encounter Medications as of 07/31/2022  Medication Sig   Ascorbic Acid (VITAMIN C) 1000 MG tablet Take 1,000 mg by mouth daily. Takes when coming down with a cold.   azelastine (OPTIVAR) 0.05 % ophthalmic solution SMARTSIG:1 Drop(s) In Eye(s) Twice Daily PRN   diclofenac sodium (VOLTAREN) 1 % GEL Apply 2 g topically 4 (four) times daily.   HYDROcodone-acetaminophen (NORCO) 7.5-325 MG tablet Take 1 tablet by mouth every 8 (eight) hours as needed for moderate pain. Do Not Fill Before 07/17/2022   Hypromellose (ARTIFICIAL TEARS OP) Place 1 drop into the right eye daily as needed (for dry eyes).   latanoprost (XALATAN) 0.005 % ophthalmic solution 1 drop at bedtime.   losartan-hydrochlorothiazide (HYZAAR) 100-12.5 MG tablet Take 1 tablet by mouth daily.   Multiple Vitamin (MULTIVITAMIN WITH MINERALS) TABS tablet Take 1 tablet by mouth daily.   Olopatadine HCl 0.2 % SOLN Apply 1 drop to eye daily as needed for allergies.   phenazopyridine (PYRIDIUM) 100 MG tablet Take 1 tablet (100 mg total) by mouth 3 (three) times  daily as needed for pain.   prednisoLONE acetate (PRED FORTE) 1 % ophthalmic suspension Place 1 drop into the left eye as needed (flare ups).    triamcinolone cream (KENALOG) 0.1 % Apply 1 application topically at bedtime as needed (for rash).   No facility-administered encounter medications on file as of 07/31/2022.    Allergies (verified) Gadobenate and Ivp dye [iodinated contrast media]   History: Past Medical History:  Diagnosis Date   Chronic back pain    H/O sarcoidosis    Hypertension    Seasonal allergies    Stroke [facility])    Past Surgical History:  Procedure Laterality Date   BREAST BIOPSY Left 04/29/2016   BREAST BIOPSY Left 04/23/2016   DILATATION & CURRETTAGE/HYSTEROSCOPY WITH RESECTOCOPE N/A 12/10/2012   Procedure: DILATATION & CURETTAGE/HYSTEROSCOPY WITH RESECTOCOPE;  Surgeon: Marvene Staff, MD;  Location: [facility];  Service: Gynecology;  Laterality: N/A;   FOOT SURGERY     left-pins placed   LYMPH NODE BIOPSY     POLYPECTOMY N/A 12/10/2012   Procedure: POLYPECTOMY;  Surgeon: Marvene Staff, MD;  Location: [facility];  Service: Gynecology;  Laterality: N/A;   STRABISMUS SURGERY Left 04/17/2017   Procedure: REPAIR STRABISMUS LEFT EYE;  Surgeon: Everitt Amber, MD;  Location: [facility];  Service: Ophthalmology;  Laterality: Left;   Family History  Problem Relation Age of  Onset   Stroke Mother    Colon cancer Maternal Uncle    Breast cancer Neg Hx    Social History   Socioeconomic History   Marital status: Married    Spouse name: Not on file   Number of children: 2   Years of education: 12   Highest education level: Not on file  Occupational History   Occupation: disability  Tobacco Use   Smoking status: Former    Packs/day: 0.00    Years: 0.00    Total pack years: 0.00    Types: Cigarettes    Quit date: 03/13/2017    Years since quitting: 5.3   Smokeless tobacco: Never   Tobacco comments:    4  to 5  cigarettes daily for years   Vaping Use   Vaping Use: Never used  Substance and Sexual Activity   Alcohol use: Not Currently    Comment: occassionally   Drug use: Yes    Types: Hydrocodone   Sexual activity: Not Currently  Other Topics Concern   Not on file  Social History Narrative   Patient is married with 2 children.   Patient is right handed.   Patient has hs education.   Patient drinks 4 cups daily.   Social Determinants of Health   Financial Resource Strain: Low Risk  (07/31/2022)   Overall Financial Resource Strain (CARDIA)    Difficulty of Paying Living Expenses: Not hard at all  Food Insecurity: No Food Insecurity (07/31/2022)   Hunger Vital Sign    Worried About Running Out of Food in the Last Year: Never true    Ran Out of Food in the Last Year: Never true  Transportation Needs: No Transportation Needs (07/31/2022)   PRAPARE - Hydrologist (Medical): No    Lack of Transportation (Non-Medical): No  Physical Activity: Insufficiently Active (07/31/2022)   Exercise Vital Sign    Days of Exercise per Week: 3 days    Minutes of Exercise per Session: 10 min  Stress: No Stress Concern Present (07/31/2022)   [place]    Feeling of Stress : Not at all  Social Connections: Not on file    Tobacco Counseling Counseling given: Not Answered Tobacco comments: 4  to 5  cigarettes daily for years   Clinical Intake:  Pre-visit preparation completed: Yes  Pain : No/denies pain     Nutritional Status: BMI > 30  Obese Nutritional Risks: None Diabetes: No  How often do you need to have someone help you when you read instructions, pamphlets, or other written materials from your doctor or pharmacy?: 1 - Never What is the last grade level you completed in school?: 12th grade  Diabetic? no  Interpreter Needed?: No  Information entered by :: NAllen LPN   Activities of Daily Living    07/31/2022    11:40 AM  In your present state of health, do you have any difficulty performing the following activities:  Hearing? 0  Vision? 1  Comment no vision in left eye, blurriness sometimes  Difficulty concentrating or making decisions? 0  Walking or climbing stairs? 0  Dressing or bathing? 0  Doing errands, shopping? 1  Comment does not drive  Preparing Food and eating ? N  Using the Toilet? N  In the past six months, have you accidently leaked urine? N  Do you have problems with loss of bowel control? N  Managing your Medications? N  Managing your Finances? N  Housekeeping or managing your Housekeeping? N    Patient Care Team: [name], FNP as PCP - General (General Practice)  Indicate any recent Medical Services you may have received from other than [facility] providers in the past year (date may be approximate).     Assessment:   This is a routine wellness examination for Jayana.  Hearing/Vision screen Vision Screening - Comments:: Regular eye exams, [facility]  Dietary issues and exercise activities discussed: Current Exercise Habits: Home exercise routine, Type of exercise: Other - see comments (stationary bike), Time (Minutes): 10, Frequency (Times/Week): 3, Weekly Exercise (Minutes/Week): 30   Goals Addressed             This Visit's Progress    Patient Stated       07/31/2022, wants to increase exercise       Depression Screen    07/31/2022   11:40 AM 07/31/2022   10:45 AM 06/13/2022    9:23 AM 04/17/2022   10:26 AM 12/17/2021   10:35 AM 10/22/2021   10:07 AM 08/13/2021   10:56 AM  PHQ 2/9 Scores  PHQ - 2 Score 0 0 0 0 0 0 0    Fall Risk    07/31/2022   11:39 AM 07/31/2022   10:44 AM 06/13/2022    9:22 AM 04/17/2022   10:26 AM 02/11/2022   10:19 AM  Fall Risk   Falls in the past year? 0 0 0 0 0  Number falls in past yr: 0 0     Injury with Fall? 0 0     Risk for fall due to : Medication side effect No Fall Risks     Follow up Falls prevention  discussed;Education provided;Falls evaluation completed Falls evaluation completed       FALL RISK PREVENTION PERTAINING TO THE HOME:  Any stairs in or around the home? Yes  If so, are there any without handrails? No  Home free of loose throw rugs in walkways, pet beds, electrical cords, etc? Yes  Adequate lighting in your home to reduce risk of falls? Yes   ASSISTIVE DEVICES UTILIZED TO PREVENT FALLS:  Life alert? No  Use of a cane, walker or w/c? No  Grab bars in the bathroom? No  Shower chair or bench in shower? Yes  Elevated toilet seat or a handicapped toilet? Yes   TIMED UP AND GO:  Was the test performed? Yes .  Length of time to ambulate 10 feet: 5 sec.   Gait steady and fast without use of assistive device  Cognitive Function:        07/31/2022   11:42 AM 07/24/2021   12:05 PM 08/30/2020    9:27 AM 04/13/2019    9:02 AM  6CIT Screen  What Year? 0 points 0 points 0 points 0 points  What month? 0 points 0 points 0 points 0 points  What time? 0 points 0 points 0 points 0 points  Count back from 20 2 points 0 points 2 points 0 points  Months in reverse 2 points 4 points 2 points 2 points  Repeat phrase 0 points 10 points 4 points 2 points  Total Score 4 points 14 points 8 points 4 points    Immunizations Immunization History  Administered Date(s) Administered   Influenza,inj,Quad PF,6+ Mos 07/08/2014, 08/12/2018, 09/07/2019, 07/24/2021   Influenza-Unspecified 08/11/2018   Moderna Sars-Covid-2 Vaccination 12/30/2019, 01/30/2020   Pneumococcal Polysaccharide-23 07/08/2014   Tdap 03/04/2022   Zoster Recombinat (  Shingrix) 03/06/2020    TDAP status: Up to date  Flu Vaccine status: Completed at today's visit  Pneumococcal vaccine status: Up to date  Covid-19 vaccine status: Completed vaccines  Qualifies for Shingles Vaccine? Yes   Zostavax completed No   Shingrix Completed?: declined second dose  Screening Tests Health Maintenance  Topic Date Due    INFLUENZA VACCINE  05/13/2022   COLONOSCOPY (Pts 45-72yr Insurance coverage will need to be confirmed)  07/05/2022   COVID-19 Vaccine (3 - Moderna risk series) 08/16/2022 (Originally 02/27/2020)   Zoster Vaccines- Shingrix (2 of 2) 10/31/2022 (Originally 05/01/2020)   Diabetic kidney evaluation - Urine ACR  03/05/2023   Diabetic kidney evaluation - GFR measurement  05/27/2023   MAMMOGRAM  08/30/2023   PAP SMEAR-Modifier  03/04/2025   TETANUS/TDAP  03/04/2032   Hepatitis C Screening  Completed   HIV Screening  Completed   HPV VACCINES  Aged Out    Health Maintenance  Health Maintenance Due  Topic Date Due   INFLUENZA VACCINE  05/13/2022   COLONOSCOPY (Pts 45-422yrInsurance coverage will need to be confirmed)  07/05/2022    Colorectal cancer screening: Type of screening: Colonoscopy. Completed 07/05/2012. Repeat every 10 years  Mammogram status: scheduled for 09/01/2022  Bone Density status: n/a  Lung Cancer Screening: (Low Dose CT Chest recommended if Age 64-80ears, 30 pack-year currently smoking OR have quit w/in 15years.) does not qualify.   Lung Cancer Screening Referral:  no  Additional Screening:  Hepatitis C Screening: does qualify; Completed 09/01/2018  Vision Screening: Recommended annual ophthalmology exams for early detection of glaucoma and other disorders of the eye. Is the patient up to date with their annual eye exam?  Yes  Who is the provider or what is the name of the office in which the patient attends annual eye exams? [facility] If pt is not established with a provider, would they like to be referred to a provider to establish care? No .   Dental Screening: Recommended annual dental exams for proper oral hygiene  Community Resource Referral / Chronic Care Management: CRR required this visit?  No   CCM required this visit?  No      Plan:     I have personally reviewed and noted the following in the patient's chart:   Medical and social  history Use of alcohol, tobacco or illicit drugs  Current medications and supplements including opioid prescriptions. Patient is currently taking opioid prescriptions. Information provided to patient regarding non-opioid alternatives. Patient advised to discuss non-opioid treatment plan with their provider. Functional ability and status Nutritional status Physical activity Advanced directives List of other physicians Hospitalizations, surgeries, and ER visits in previous 12 months Vitals Screenings to include cognitive, depression, and falls Referrals and appointments  In addition, I have reviewed and discussed with patient certain preventive protocols, quality metrics, and best practice recommendations. A written personalized care plan for preventive services as well as general preventive health recommendations were provided to patient.     NiKellie SimmeringLPN   [id number] Nurse Notes: none

## 2022-08-11 ENCOUNTER — Other Ambulatory Visit: Payer: Self-pay

## 2022-08-11 DIAGNOSIS — R21 Rash and other nonspecific skin eruption: Secondary | ICD-10-CM

## 2022-08-11 MED ORDER — ATORVASTATIN CALCIUM 20 MG PO TABS: 20.0000 mg | ORAL_TABLET | Freq: Every day | ORAL | 3 refills | Status: DC

## 2022-08-11 MED ORDER — TRIAMCINOLONE ACETONIDE 0.1 % EX CREA: 1.0000 | TOPICAL_CREAM | Freq: Every evening | CUTANEOUS | 1 refills | Status: AC | PRN

## 2022-08-14 ENCOUNTER — Encounter: Payer: Medicare Other | Attending: Registered Nurse | Admitting: Registered Nurse

## 2022-08-14 ENCOUNTER — Encounter: Payer: Self-pay | Admitting: Registered Nurse

## 2022-08-14 VITALS — BP 134/79 | HR 67 | Ht 68.0 in | Wt 203.0 lb

## 2022-08-14 DIAGNOSIS — G894 Chronic pain syndrome: Secondary | ICD-10-CM | POA: Diagnosis not present

## 2022-08-14 DIAGNOSIS — M2550 Pain in unspecified joint: Secondary | ICD-10-CM | POA: Diagnosis not present

## 2022-08-14 DIAGNOSIS — M47816 Spondylosis without myelopathy or radiculopathy, lumbar region: Secondary | ICD-10-CM | POA: Insufficient documentation

## 2022-08-14 DIAGNOSIS — M5416 Radiculopathy, lumbar region: Secondary | ICD-10-CM | POA: Diagnosis not present

## 2022-08-14 DIAGNOSIS — M7062 Trochanteric bursitis, left hip: Secondary | ICD-10-CM | POA: Insufficient documentation

## 2022-08-14 DIAGNOSIS — Z79891 Long term (current) use of opiate analgesic: Secondary | ICD-10-CM | POA: Diagnosis not present

## 2022-08-14 DIAGNOSIS — Z5181 Encounter for therapeutic drug level monitoring: Secondary | ICD-10-CM | POA: Diagnosis not present

## 2022-08-14 MED ORDER — HYDROCODONE-ACETAMINOPHEN 7.5-325 MG PO TABS: 1.0000 | ORAL_TABLET | Freq: Three times a day (TID) | ORAL | 0 refills | Status: DC | PRN

## 2022-08-14 NOTE — Progress Notes (Signed)
Subjective:    Patient ID: Brittany Crawford, female    DOB: 16-Feb-1956, 66 y.o.   MRN: [id number]  HPI: Brittany Crawford is a 66 y.o. female who returns for follow up appointment for chronic pain and medication refill. She states her pain is located in her Lower back and bilateral hips. She rates her pain 5. Her current exercise regime is walking, she was encouraged to increase her HEP as tlerated. She verbalizes understanding.   Brittany Crawford voiced concerned about her lab work ( Lipid Profile), labs was reviewed with Brittany Crawford she was encouraged to send a message to her PCP, to discuss treatment modalities, she verbalizes understanding. We discussed her risk factors and she verbalizes understanding.   Brittany Crawford Morphine equivalent is 22.50 MME.   Last UDS was Performed on 06/13/2022, it was consistent.     Pain Inventory Average Pain 8 Pain Right Now 5 My pain is intermittent, sharp, and aching  In the last 24 hours, has pain interfered with the following? General activity 7 Relation with others 0 Enjoyment of life 0 What TIME of day is your pain at its worst? evening Sleep (in general) Good  Pain is worse with: bending, standing, and some activites Pain improves with: rest, heat/ice, therapy/exercise, pacing activities, and medication Relief from Meds: 10  Family History  Problem Relation Age of Onset   Stroke Mother    Colon cancer Maternal Uncle    Breast cancer Neg Hx    Social History   Socioeconomic History   Marital status: Married    Spouse name: Not on file   Number of children: 2   Years of education: 12   Highest education level: Not on file  Occupational History   Occupation: disability  Tobacco Use   Smoking status: Former    Packs/day: 0.00    Years: 0.00    Total pack years: 0.00    Types: Cigarettes    Quit date: 03/13/2017    Years since quitting: 5.4   Smokeless tobacco: Never   Tobacco comments:    4  to 5  cigarettes daily for years   Vaping Use   Vaping Use: Never used  Substance and Sexual Activity   Alcohol use: Not Currently    Comment: occassionally   Drug use: Yes    Types: Hydrocodone   Sexual activity: Not Currently  Other Topics Concern   Not on file  Social History Narrative   Patient is married with 2 children.   Patient is right handed.   Patient has hs education.   Patient drinks 4 cups daily.   Social Determinants of Health   Financial Resource Strain: Low Risk  (07/31/2022)   Overall Financial Resource Strain (CARDIA)    Difficulty of Paying Living Expenses: Not hard at all  Food Insecurity: No Food Insecurity (07/31/2022)   Hunger Vital Sign    Worried About Running Out of Food in the Last Year: Never true    Ran Out of Food in the Last Year: Never true  Transportation Needs: No Transportation Needs (07/31/2022)   PRAPARE - Hydrologist (Medical): No    Lack of Transportation (Non-Medical): No  Physical Activity: Insufficiently Active (07/31/2022)   Exercise Vital Sign    Days of Exercise per Week: 3 days    Minutes of Exercise per Session: 10 min  Stress: No Stress Concern Present (07/31/2022)   [place]  Feeling of Stress : Not at all  Social Connections: Not on file   Past Surgical History:  Procedure Laterality Date   BREAST BIOPSY Left 04/29/2016   BREAST BIOPSY Left 04/23/2016   DILATATION & CURRETTAGE/HYSTEROSCOPY WITH RESECTOCOPE N/A 12/10/2012   Procedure: DILATATION & CURETTAGE/HYSTEROSCOPY WITH RESECTOCOPE;  Surgeon: Marvene Staff, MD;  Location: [facility];  Service: Gynecology;  Laterality: N/A;   FOOT SURGERY     left-pins placed   LYMPH NODE BIOPSY     POLYPECTOMY N/A 12/10/2012   Procedure: POLYPECTOMY;  Surgeon: Marvene Staff, MD;  Location: [facility];  Service: Gynecology;  Laterality: N/A;   STRABISMUS SURGERY Left 04/17/2017   Procedure: REPAIR STRABISMUS LEFT  EYE;  Surgeon: Everitt Amber, MD;  Location: [facility];  Service: Ophthalmology;  Laterality: Left;   Past Surgical History:  Procedure Laterality Date   BREAST BIOPSY Left 04/29/2016   BREAST BIOPSY Left 04/23/2016   DILATATION & CURRETTAGE/HYSTEROSCOPY WITH RESECTOCOPE N/A 12/10/2012   Procedure: DILATATION & CURETTAGE/HYSTEROSCOPY WITH RESECTOCOPE;  Surgeon: Marvene Staff, MD;  Location: [facility];  Service: Gynecology;  Laterality: N/A;   FOOT SURGERY     left-pins placed   LYMPH NODE BIOPSY     POLYPECTOMY N/A 12/10/2012   Procedure: POLYPECTOMY;  Surgeon: Marvene Staff, MD;  Location: [facility];  Service: Gynecology;  Laterality: N/A;   STRABISMUS SURGERY Left 04/17/2017   Procedure: REPAIR STRABISMUS LEFT EYE;  Surgeon: Everitt Amber, MD;  Location: [facility];  Service: Ophthalmology;  Laterality: Left;   Past Medical History:  Diagnosis Date   Chronic back pain    H/O sarcoidosis    Hypertension    Seasonal allergies    Stroke (HCC)    Ht '5\' 8"'$  (1.727 m)   Wt 203 lb (92.1 kg)   LMP 11/13/2012   BMI 30.87 kg/m   Opioid Risk Score:   Fall Risk Score:  `1  Depression screen [facility] 2/9     08/14/2022    9:40 AM 07/31/2022   11:40 AM 07/31/2022   10:45 AM 06/13/2022    9:23 AM 04/17/2022   10:26 AM 12/17/2021   10:35 AM 10/22/2021   10:07 AM  Depression screen PHQ 2/9  Decreased Interest 0 0 0 0 0 0 0  Down, Depressed, Hopeless 0 0 0 0 0 0 0  PHQ - 2 Score 0 0 0 0 0 0 0    Review of Systems  Musculoskeletal:  Positive for back pain.       Pain in both hips  All other systems reviewed and are negative.     Objective:   Physical Exam Vitals and nursing note reviewed.  Constitutional:      Appearance: Normal appearance.  Cardiovascular:     Rate and Rhythm: Normal rate and regular rhythm.     Pulses: Normal pulses.     Heart sounds: Normal heart sounds.  Pulmonary:     Effort: Pulmonary effort is normal.     Breath sounds:  Normal breath sounds.  Musculoskeletal:     Cervical back: Normal range of motion and neck supple.     Comments: Normal Muscle Bulk and Muscle Testing Reveals:  Upper Extremities: Full ROM and Muscle Strength 5/5 Lumbar Paraspinal Tenderness: L-3-:L-5 Bilateral Greater Trochanter Tenderness Lower Extremities: Full ROM and Muscle Strength 5/5 Arises from Table with ease Narrow Based  Gait     Skin:    General: Skin is warm and dry.  Neurological:     Mental Status: She is alert and oriented to person, place, and time.  Psychiatric:        Mood and Affect: Mood normal.        Behavior: Behavior normal.         Assessment & Plan:  1. Lumbar spondylosis with DDD and facet arthropathy.Left Lumbar Radiculitis: Continue HEP as Tolerated. Continue to Monitor.  08/14/2022 Refilled: Hydrocodone 7.5 /325 mg one tablet every 8 hours as needed for pain.  #90 tablets. . Second script e-scribe for the following month. We will continue the opioid monitoring program, this consists of regular clinic visits, examinations, urine drug screen, pill counts as well as use of [place] Controlled Substance Reporting system. A 12 month History has been reviewed on the [place] on 08/14/2022. 2. Bilateral knee pain: No complaints voiced today: Continue with heat/ice, exercise and Diclofenac. 08/14/2022 3. Right thalamic/internal capsule lacunar infarct with persistent left hemisensory deficits: Neurology Following. Continue to Monitor. 08/14/2022. 4.Left Greater Trochanteric Bursitis: no complaints today. Continue HEP as Tolerated and Alternate Ice and Heat Therapy.  Continue current medication regime. Continue to Monitor.  08/14/2022. 5. Polyarthralgia: Continue HEP as Tolerated. Continue to monitor. 08/14/2022 6. Cervicalgia: No complaints  today.  Continue to alternate with heat and Ice therapy. Continue to Monitor.     F/U in 2 months

## 2022-08-18 ENCOUNTER — Other Ambulatory Visit: Payer: Self-pay

## 2022-08-18 MED ORDER — PRAVASTATIN SODIUM 20 MG PO TABS: 20.0000 mg | ORAL_TABLET | Freq: Every evening | ORAL | 1 refills | Status: DC

## 2022-08-19 ENCOUNTER — Encounter: Payer: Self-pay | Admitting: Gastroenterology

## 2022-08-21 ENCOUNTER — Other Ambulatory Visit: Payer: Self-pay

## 2022-09-01 ENCOUNTER — Ambulatory Visit: Payer: Medicare Other

## 2022-09-26 ENCOUNTER — Other Ambulatory Visit: Payer: Self-pay

## 2022-09-26 ENCOUNTER — Encounter: Payer: Self-pay | Admitting: Gastroenterology

## 2022-09-26 ENCOUNTER — Ambulatory Visit (INDEPENDENT_AMBULATORY_CARE_PROVIDER_SITE_OTHER): Payer: Medicare Other | Admitting: Gastroenterology

## 2022-09-26 VITALS — BP 140/82 | HR 74 | Ht 68.0 in | Wt 201.0 lb

## 2022-09-26 DIAGNOSIS — R109 Unspecified abdominal pain: Secondary | ICD-10-CM

## 2022-09-26 DIAGNOSIS — R0789 Other chest pain: Secondary | ICD-10-CM

## 2022-09-26 DIAGNOSIS — Z1212 Encounter for screening for malignant neoplasm of rectum: Secondary | ICD-10-CM

## 2022-09-26 DIAGNOSIS — Z1211 Encounter for screening for malignant neoplasm of colon: Secondary | ICD-10-CM | POA: Diagnosis not present

## 2022-09-26 DIAGNOSIS — G8929 Other chronic pain: Secondary | ICD-10-CM | POA: Diagnosis not present

## 2022-09-26 DIAGNOSIS — Z8673 Personal history of transient ischemic attack (TIA), and cerebral infarction without residual deficits: Secondary | ICD-10-CM

## 2022-09-26 MED ORDER — LOSARTAN POTASSIUM-HCTZ 100-12.5 MG PO TABS: 1.0000 | ORAL_TABLET | Freq: Every day | ORAL | 1 refills | Status: DC

## 2022-09-26 MED ORDER — NA SULFATE-K SULFATE-MG SULF 17.5-3.13-1.6 GM/177ML PO SOLN: 1.0000 | Freq: Once | ORAL | 0 refills | Status: AC

## 2022-09-26 NOTE — Patient Instructions (Signed)
_______________________________________________________  If you are age 64 or older, your body mass index should be between 23-30. Your Body mass index is 30.56 kg/m. If this is out of the aforementioned range listed, please consider follow up with your Primary Care Provider.  If you are age 53 or younger, your body mass index should be between 19-25. Your Body mass index is 30.56 kg/m. If this is out of the aformentioned range listed, please consider follow up with your Primary Care Provider.   You have been scheduled for a colonoscopy. Please follow written instructions given to you at your visit today.  Please pick up your prep supplies at the pharmacy within the next 1-3 days. If you use inhalers (even only as needed), please bring them with you on the day of your procedure.   The [facility] providers would like to encourage you to use [facility] to communicate with providers for non-urgent requests or questions.  Due to long hold times on the telephone, sending your provider a message by [facility] may be a faster and more efficient way to get a response.  Please allow 48 business hours for a response.  Please remember that this is for non-urgent requests.   It was a pleasure to see you today!  Thank you for trusting me with your gastrointestinal care!

## 2022-09-26 NOTE — Progress Notes (Signed)
HPI : Brittany Crawford is a very pleasant 66 year old female with a history of sarcoidosis, stroke and hypertension who is referred to [place] by [name], FNP for colon cancer screening.  She had her initial average risk screening colonoscopy in 2013 by Dr. Sharlett Iles.  A few hyperplastic polyps were removed in the rectum but no adenomatous polyps were found.  She has regular bowel movements and denies problems with constipation, diarrhea or blood in the stool.   She is bothered by a persistent discomfort along her left flank/chest wall.  It is mild (3-4/10), dull/achy in character and has been present for years.  It never goes away completely.  It does not vary with eating or with bowel movements.  Using a heating pad helps sometimes.  No nausea/vomiting.  She denies symptoms of burning pain in her epigastrium/chest/throat.  No acid regurgitation, cough/throat irritation.  No early satiety, bloating, post-prandial discomfort. Her appetite is good and her weight is stable.   She has no family history of colon cancer.  She has a history of a stroke 5 years ago when she presented with facial weakness and slurred speech.  She has not had any recurrent stroke/TIA symptoms.  She does not take an aspirin or other antiplatelet agent.   Colonoscopy:  Sept 2013: Dr. Sharlett Iles Avg risk screening colonoscopy Rectal hyperplastic polyps   Past Medical History:  Diagnosis Date   Chronic back pain    H/O sarcoidosis    Hypertension    Seasonal allergies    Stroke [facility])      Past Surgical History:  Procedure Laterality Date   BREAST BIOPSY Left 04/29/2016   BREAST BIOPSY Left 04/23/2016   DILATATION & CURRETTAGE/HYSTEROSCOPY WITH RESECTOCOPE N/A 12/10/2012   Procedure: DILATATION & CURETTAGE/HYSTEROSCOPY WITH RESECTOCOPE;  Surgeon: Marvene Staff, MD;  Location: [facility];  Service: Gynecology;  Laterality: N/A;   FOOT SURGERY     left-pins placed   LYMPH NODE BIOPSY     POLYPECTOMY N/A 12/10/2012    Procedure: POLYPECTOMY;  Surgeon: Marvene Staff, MD;  Location: [facility];  Service: Gynecology;  Laterality: N/A;   STRABISMUS SURGERY Left 04/17/2017   Procedure: REPAIR STRABISMUS LEFT EYE;  Surgeon: Everitt Amber, MD;  Location: [place];  Service: Ophthalmology;  Laterality: Left;   Family History  Problem Relation Age of Onset   Stroke Mother    Colon cancer Maternal Uncle    Breast cancer Neg Hx    Social History   Tobacco Use   Smoking status: Former    Packs/day: 0.00    Years: 0.00    Total pack years: 0.00    Types: Cigarettes    Quit date: 03/13/2017    Years since quitting: 5.5   Smokeless tobacco: Never   Tobacco comments:    4  to 5  cigarettes daily for years  Vaping Use   Vaping Use: Never used  Substance Use Topics   Alcohol use: Not Currently    Comment: occassionally   Drug use: Yes    Types: Hydrocodone   Current Outpatient Medications  Medication Sig Dispense Refill   Ascorbic Acid (VITAMIN C) 1000 MG tablet Take 1,000 mg by mouth daily. Takes when coming down with a cold.     azelastine (OPTIVAR) 0.05 % ophthalmic solution SMARTSIG:1 Drop(s) In Eye(s) Twice Daily PRN     blood glucose meter kit and supplies KIT Dispense based on patient and insurance preference. Use up to four times daily  as directed. 1 each 0   dapagliflozin propanediol (FARXIGA) 10 MG TABS tablet TAKE 1 TABLET(10 MG) BY MOUTH DAILY BEFORE BREAKFAST 90 tablet 2   diclofenac sodium (VOLTAREN) 1 % GEL Apply 2 g topically 4 (four) times daily. 300 g 2   HYDROcodone-acetaminophen (NORCO) 7.5-325 MG tablet Take 1 tablet by mouth every 8 (eight) hours as needed for moderate pain. Do Not Fill Before 09/14/2022 90 tablet 0   Hypromellose (ARTIFICIAL TEARS OP) Place 1 drop into the right eye daily as needed (for dry eyes).     latanoprost (XALATAN) 0.005 % ophthalmic solution 1 drop at bedtime.     losartan-hydrochlorothiazide (HYZAAR) 100-12.5 MG tablet Take 1 tablet by  mouth daily. 90 tablet 1   Multiple Vitamin (MULTIVITAMIN WITH MINERALS) TABS tablet Take 1 tablet by mouth daily.     Olopatadine HCl 0.2 % SOLN Apply 1 drop to eye daily as needed for allergies.  3   phenazopyridine (PYRIDIUM) 100 MG tablet Take 1 tablet (100 mg total) by mouth 3 (three) times daily as needed for pain. 10 tablet 0   pravastatin (PRAVACHOL) 20 MG tablet Take 1 tablet (20 mg total) by mouth every evening. 90 tablet 1   triamcinolone cream (KENALOG) 0.1 % Apply 1 Application topically at bedtime as needed (for rash). 30 g 1   No current facility-administered medications for this visit.   Allergies  Allergen Reactions   Gadobenate Nausea And Vomiting    Pt was fine after a few minutes , vomiting after contrast injection smills    Ivp Dye [Iodinated Contrast Media]      Review of Systems: All systems reviewed and negative except where noted in HPI.    No results found.  Physical Exam: BP (!) 140/82   Pulse 74   Ht _0  (1.727 m)   Wt 201 lb (91.2 kg)   LMP 11/13/2012   BMI 30.56 kg/m  Constitutional: Pleasant,well-developed, African American female in no acute distress. HEENT: Normocephalic and atraumatic. Conjunctivae are normal. No scleral icterus. Neck supple.  Cardiovascular: Normal rate, regular rhythm.  Pulmonary/chest: Effort normal and breath sounds normal. No wheezing, rales or rhonchi.  Tenderness to palpation along the left chest wall underneath the breast. Abdominal: Soft, nondistended, nontender. Bowel sounds active throughout. There are no masses palpable. No hepatomegaly.   Extremities: no edema Lymphadenopathy: No cervical adenopathy noted. Neurological: Alert and oriented to person place and time. Skin: Skin is warm and dry. No rashes noted. Psychiatric: Normal mood and affect. Behavior is normal.  CBC    Component Value Date/Time   WBC 6.2 05/26/2022 0800   RBC 4.77 05/26/2022 0800   HGB 13.9 05/26/2022 0800   HGB 13.6 03/04/2022 1138    HCT 42.3 05/26/2022 0800   HCT 40.8 03/04/2022 1138   PLT 316 05/26/2022 0800   PLT 275 03/04/2022 1138   MCV 88.7 05/26/2022 0800   MCV 85 03/04/2022 1138   MCH 29.1 05/26/2022 0800   MCHC 32.9 05/26/2022 0800   RDW 13.7 05/26/2022 0800   RDW 14.4 03/04/2022 1138   LYMPHSABS 3.4 05/26/2022 0800   MONOABS 0.6 05/26/2022 0800   EOSABS 0.2 05/26/2022 0800   BASOSABS 0.0 05/26/2022 0800    CMP     Component Value Date/Time   NA 138 05/26/2022 0800   NA 140 03/04/2022 1138   K 3.5 05/26/2022 0800   CL 105 05/26/2022 0800   CO2 22 05/26/2022 0800   GLUCOSE 105 (H) 05/26/2022 0800  BUN 11 05/26/2022 0800   BUN 11 03/04/2022 1138   CREATININE 1.14 (H) 05/26/2022 0800   CALCIUM 10.0 05/26/2022 0800   PROT 7.5 05/26/2022 0800   PROT 7.7 03/04/2022 1138   ALBUMIN 4.0 05/26/2022 0800   ALBUMIN 4.6 03/04/2022 1138   AST 17 05/26/2022 0800   ALT 15 05/26/2022 0800   ALKPHOS 51 05/26/2022 0800   BILITOT 0.7 05/26/2022 0800   BILITOT 0.8 03/04/2022 1138   GFRNONAA 54 (L) 05/26/2022 0800   GFRAA 62 04/23/2020 1209     ASSESSMENT AND PLAN: 66 year old female due for average risk screening colonoscopy.  She has a chronic pain along her left chest wall that seems most consistent with an MSK etiology.  Not gastrointestinal in etiology.  No further GI evaluation recommended.  She has a remote history of a stroke but is not taking aspirin.  She is not sure why.  I recommended she discuss taking a baby aspirin with her PCP. Will schedule for routine screening colonoscopy.  Colon cancer screening - Colonoscopy  Left sided flank pain - MSK in etiology, f/u PCP/pain management - No further Gi evaluation recommended  History of stroke - Recommended daily ASA; discuss with PCP  The details, risks (including bleeding, perforation, infection, missed lesions, medication reactions and possible hospitalization or surgery if complications occur), benefits, and alternatives to colonoscopy  with possible biopsy and possible polypectomy were discussed with the patient and she consents to proceed.   Brittany Crawford E. Candis Schatz, MD [facility]   [name], [place]

## 2022-10-09 ENCOUNTER — Encounter: Payer: Self-pay | Admitting: Gastroenterology

## 2022-10-14 ENCOUNTER — Encounter: Payer: 59 | Attending: Registered Nurse | Admitting: Registered Nurse

## 2022-10-14 ENCOUNTER — Encounter: Payer: Self-pay | Admitting: Registered Nurse

## 2022-10-14 VITALS — BP 137/85 | HR 85 | Ht 68.0 in | Wt 200.0 lb

## 2022-10-14 DIAGNOSIS — M2550 Pain in unspecified joint: Secondary | ICD-10-CM | POA: Diagnosis not present

## 2022-10-14 DIAGNOSIS — M7061 Trochanteric bursitis, right hip: Secondary | ICD-10-CM | POA: Diagnosis not present

## 2022-10-14 DIAGNOSIS — M7062 Trochanteric bursitis, left hip: Secondary | ICD-10-CM | POA: Insufficient documentation

## 2022-10-14 DIAGNOSIS — G894 Chronic pain syndrome: Secondary | ICD-10-CM | POA: Insufficient documentation

## 2022-10-14 DIAGNOSIS — M47816 Spondylosis without myelopathy or radiculopathy, lumbar region: Secondary | ICD-10-CM | POA: Diagnosis not present

## 2022-10-14 DIAGNOSIS — Z5181 Encounter for therapeutic drug level monitoring: Secondary | ICD-10-CM | POA: Diagnosis not present

## 2022-10-14 DIAGNOSIS — Z79891 Long term (current) use of opiate analgesic: Secondary | ICD-10-CM

## 2022-10-14 MED ORDER — HYDROCODONE-ACETAMINOPHEN 7.5-325 MG PO TABS: 1.0000 | ORAL_TABLET | Freq: Three times a day (TID) | ORAL | 0 refills | Status: DC | PRN

## 2022-10-14 NOTE — Progress Notes (Signed)
Subjective:    Patient ID: Brittany Crawford, female    DOB: [birth year], 67 y.o.   MRN: [id number]  HPI: Brittany Crawford is a 67 y.o. female who returns for follow up appointment for chronic pain and medication refill. She states her pain is located in her lower back, bilateral hips and generalized joint pain. She rates her pain 9. Her current exercise regime is walking and performing stretching exercises.  Ms. [place] Morphine equivalent is 22.50 MME.   UDS ordered today.      Pain Inventory Average Pain 8 Pain Right Now 9 My pain is intermittent, sharp, and aching  In the last 24 hours, has pain interfered with the following? General activity 7 Relation with others 10 Enjoyment of life 10 What TIME of day is your pain at its worst? morning  and evening Sleep (in general) Good  Pain is worse with: bending, standing, and some activites Pain improves with: rest, heat/ice, therapy/exercise, and medication Relief from Meds: 9  Family History  Problem Relation Age of Onset   Stroke Mother    Colon cancer Maternal Uncle    Breast cancer Neg Hx    Social History   Socioeconomic History   Marital status: Married    Spouse name: Not on file   Number of children: 2   Years of education: 12   Highest education level: Not on file  Occupational History   Occupation: disability  Tobacco Use   Smoking status: Former    Packs/day: 0.00    Years: 0.00    Total pack years: 0.00    Types: Cigarettes    Quit date: 03/13/2017    Years since quitting: 5.5   Smokeless tobacco: Never   Tobacco comments:    4  to 5  cigarettes daily for years  Vaping Use   Vaping Use: Never used  Substance and Sexual Activity   Alcohol use: Not Currently    Comment: occassionally   Drug use: Yes    Types: Hydrocodone   Sexual activity: Not Currently  Other Topics Concern   Not on file  Social History Narrative   Patient is married with 2 children.   Patient is right handed.   Patient has  hs education.   Patient drinks 4 cups daily.   Social Determinants of Health   Financial Resource Strain: Low Risk  (07/31/2022)   Overall Financial Resource Strain (CARDIA)    Difficulty of Paying Living Expenses: Not hard at all  Food Insecurity: No Food Insecurity (07/31/2022)   Hunger Vital Sign    Worried About Running Out of Food in the Last Year: Never true    Ran Out of Food in the Last Year: Never true  Transportation Needs: No Transportation Needs (07/31/2022)   PRAPARE - Hydrologist (Medical): No    Lack of Transportation (Non-Medical): No  Physical Activity: Insufficiently Active (07/31/2022)   Exercise Vital Sign    Days of Exercise per Week: 3 days    Minutes of Exercise per Session: 10 min  Stress: No Stress Concern Present (07/31/2022)   [place]    Feeling of Stress : Not at all  Social Connections: Not on file   Past Surgical History:  Procedure Laterality Date   BREAST BIOPSY Left 04/29/2016   BREAST BIOPSY Left 04/23/2016   DILATATION & CURRETTAGE/HYSTEROSCOPY WITH RESECTOCOPE N/A 12/10/2012   Procedure: DILATATION & CURETTAGE/HYSTEROSCOPY WITH RESECTOCOPE;  Surgeon: Marvene Staff, MD;  Location: [facility];  Service: Gynecology;  Laterality: N/A;   FOOT SURGERY     left-pins placed   LYMPH NODE BIOPSY     POLYPECTOMY N/A 12/10/2012   Procedure: POLYPECTOMY;  Surgeon: Marvene Staff, MD;  Location: [facility];  Service: Gynecology;  Laterality: N/A;   STRABISMUS SURGERY Left 04/17/2017   Procedure: REPAIR STRABISMUS LEFT EYE;  Surgeon: Everitt Amber, MD;  Location: [place];  Service: Ophthalmology;  Laterality: Left;   Past Surgical History:  Procedure Laterality Date   BREAST BIOPSY Left 04/29/2016   BREAST BIOPSY Left 04/23/2016   DILATATION & CURRETTAGE/HYSTEROSCOPY WITH RESECTOCOPE N/A 12/10/2012   Procedure: DILATATION &  CURETTAGE/HYSTEROSCOPY WITH RESECTOCOPE;  Surgeon: Marvene Staff, MD;  Location: [facility];  Service: Gynecology;  Laterality: N/A;   FOOT SURGERY     left-pins placed   LYMPH NODE BIOPSY     POLYPECTOMY N/A 12/10/2012   Procedure: POLYPECTOMY;  Surgeon: Marvene Staff, MD;  Location: [facility];  Service: Gynecology;  Laterality: N/A;   STRABISMUS SURGERY Left 04/17/2017   Procedure: REPAIR STRABISMUS LEFT EYE;  Surgeon: Everitt Amber, MD;  Location: [place];  Service: Ophthalmology;  Laterality: Left;   Past Medical History:  Diagnosis Date   Chronic back pain    H/O sarcoidosis    Hypertension    Seasonal allergies    Stroke ([place])    LMP 11/13/2012   Opioid Risk Score:   Fall Risk Score:  `1  Depression screen [facility] 2/9     08/14/2022    9:40 AM 07/31/2022   11:40 AM 07/31/2022   10:45 AM 06/13/2022    9:23 AM 04/17/2022   10:26 AM 12/17/2021   10:35 AM 10/22/2021   10:07 AM  Depression screen PHQ 2/9  Decreased Interest 0 0 0 0 0 0 0  Down, Depressed, Hopeless 0 0 0 0 0 0 0  PHQ - 2 Score 0 0 0 0 0 0 0    Review of Systems  Musculoskeletal:  Positive for back pain.  All other systems reviewed and are negative.     Objective:   Physical Exam Vitals and nursing note reviewed.  Constitutional:      Appearance: Normal appearance.  Cardiovascular:     Rate and Rhythm: Normal rate and regular rhythm.     Pulses: Normal pulses.     Heart sounds: Normal heart sounds.  Pulmonary:     Effort: Pulmonary effort is normal.     Breath sounds: Normal breath sounds.  Musculoskeletal:     Cervical back: Normal range of motion and neck supple.     Comments: Normal Muscle Bulk and Muscle Testing Reveals:  Upper Extremities: Full ROM and Muscle Strength 5/5  Lumbar Paraspinal Tenderness: L-4-L-5 Bilateral Greater Trochanter Tenderness Lower Extremities: Full ROM and Muscle Strength 5/5 Arises from Table with Ease Narrow Based  Gait     Skin:    General:  Skin is warm and dry.  Neurological:     Mental Status: She is alert and oriented to person, place, and time.  Psychiatric:        Mood and Affect: Mood normal.        Behavior: Behavior normal.         Assessment & Plan:  1. Lumbar spondylosis with DDD and facet arthropathy.Left Lumbar Radiculitis: Continue HEP as Tolerated. Continue to Monitor.  10/14/2022 Refilled: Hydrocodone 7.5 /325 mg one tablet every  8 hours as needed for pain.  #90 tablets. . Second script e-scribe for the following month. We will continue the opioid monitoring program, this consists of regular clinic visits, examinations, urine drug screen, pill counts as well as use of [place] Controlled Substance Reporting system. A 12 month History has been reviewed on the [place] Controlled Substance Reporting System on 10/14/2022. 2. Bilateral knee pain: No complaints voiced today: Continue with heat/ice, exercise and Diclofenac. 10/14/2022 3. Right thalamic/internal capsule lacunar infarct with persistent left hemisensory deficits: Neurology Following. Continue to Monitor. 10/14/2022. 4.Left Greater Trochanteric Bursitis: no complaints today. Continue HEP as Tolerated and Alternate Ice and Heat Therapy.  Continue current medication regime. Continue to Monitor.  10/14/2022. 5. Polyarthralgia: Continue HEP as Tolerated. Continue to monitor. 10/14/2022 6. Cervicalgia: No complaints  today.  Continue to alternate with heat and Ice therapy. Continue to Monitor.  10/14/2022   F/U in 2 months

## 2022-10-16 ENCOUNTER — Encounter: Payer: 59 | Admitting: Registered Nurse

## 2022-10-16 LAB — TOXASSURE SELECT,+ANTIDEPR,UR

## 2022-10-17 ENCOUNTER — Ambulatory Visit (AMBULATORY_SURGERY_CENTER): Payer: Medicare Other | Admitting: Gastroenterology

## 2022-10-17 ENCOUNTER — Encounter: Payer: Self-pay | Admitting: Gastroenterology

## 2022-10-17 VITALS — BP 113/80 | HR 76 | Temp 97.5°F | Resp 16 | Ht 68.0 in | Wt 201.0 lb

## 2022-10-17 DIAGNOSIS — D125 Benign neoplasm of sigmoid colon: Secondary | ICD-10-CM

## 2022-10-17 DIAGNOSIS — K621 Rectal polyp: Secondary | ICD-10-CM

## 2022-10-17 DIAGNOSIS — Z1212 Encounter for screening for malignant neoplasm of rectum: Secondary | ICD-10-CM | POA: Diagnosis not present

## 2022-10-17 DIAGNOSIS — K635 Polyp of colon: Secondary | ICD-10-CM

## 2022-10-17 DIAGNOSIS — Z1211 Encounter for screening for malignant neoplasm of colon: Secondary | ICD-10-CM | POA: Diagnosis not present

## 2022-10-17 DIAGNOSIS — D128 Benign neoplasm of rectum: Secondary | ICD-10-CM

## 2022-10-17 MED ORDER — SODIUM CHLORIDE 0.9 % IV SOLN: 500.0000 mL | Freq: Once | INTRAVENOUS | Status: DC

## 2022-10-17 NOTE — Op Note (Signed)
[place] Patient Name: Brittany Crawford Procedure Date: 10/17/2022 11:37 AM MRN: [id number] Endoscopist: Nicki Reaper E. Candis Schatz , MD, [id number] Age: 67 Referring MD:  Date of Birth: [birth year] Gender: Female Account #: [id number] Procedure:                Colonoscopy Indications:              Screening for colorectal malignant neoplasm (last                            colonoscopy was 10 years ago) Medicines:                Monitored Anesthesia Care Procedure:                Pre-Anesthesia Assessment:                           - Prior to the procedure, a History and Physical                            was performed, and patient medications and                            allergies were reviewed. The patient's tolerance of                            previous anesthesia was also reviewed. The risks                            and benefits of the procedure and the sedation                            options and risks were discussed with the patient.                            All questions were answered, and informed consent                            was obtained. Prior Anticoagulants: The patient has                            taken no anticoagulant or antiplatelet agents. ASA                            Grade Assessment: II - A patient with mild systemic                            disease. After reviewing the risks and benefits,                            the patient was deemed in satisfactory condition to                            undergo the procedure.  After obtaining informed consent, the colonoscope                            was passed under direct vision. Throughout the                            procedure, the patient's blood pressure, pulse, and                            oxygen saturations were monitored continuously. The                            Colonoscope was introduced through the anus and                            advanced to the the  cecum, identified by                            appendiceal orifice and ileocecal valve. The                            colonoscopy was performed without difficulty. The                            patient tolerated the procedure well. The quality                            of the bowel preparation was good. The ileocecal                            valve, appendiceal orifice, and rectum were                            photographed. The bowel preparation used was SUPREP                            via split dose instruction. Scope In: 11:42:21 AM Scope Out: 11:56:52 AM Scope Withdrawal Time: 0 hours 10 minutes 22 seconds  Total Procedure Duration: 0 hours 14 minutes 31 seconds  Findings:                 The perianal and digital rectal examinations were                            normal. Pertinent negatives include normal                            sphincter tone and no palpable rectal lesions.                           A 5 mm polyp was found in the sigmoid colon. The                            polyp was flat. The polyp was removed with a  cold                            snare. Resection and retrieval were complete.                            Estimated blood loss was minimal.                           Two flat polyps were found in the rectum. The                            polyps were 4 to 5 mm in size. These polyps were                            removed with a cold snare. Resection and retrieval                            were complete. Estimated blood loss was minimal.                           A single medium-mouthed diverticulum was found in                            the transverse colon.                           The exam was otherwise normal throughout the                            examined colon.                           The retroflexed view of the distal rectum and anal                            verge was normal and showed no anal or rectal                             abnormalities. Complications:            No immediate complications. Estimated Blood Loss:     Estimated blood loss was minimal. Impression:               - One 5 mm polyp in the sigmoid colon, removed with                            a cold snare. Resected and retrieved.                           - Two 4 to 5 mm polyps in the rectum, removed with                            a cold snare. Resected and retrieved.                           -  Diverticulosis in the transverse colon.                           - The distal rectum and anal verge are normal on                            retroflexion view. Recommendation:           - Patient has a contact number available for                            emergencies. The signs and symptoms of potential                            delayed complications were discussed with the                            patient. Return to normal activities tomorrow.                            Written discharge instructions were provided to the                            patient.                           - Resume previous diet.                           - Continue present medications.                           - Await pathology results.                           - Repeat colonoscopy (date not yet determined) for                            surveillance based on pathology results. Taha Dimond E. Candis Schatz, MD 10/17/2022 12:02:30 PM This report has been signed electronically.

## 2022-10-17 NOTE — Progress Notes (Signed)
Called to room to assist during endoscopic procedure.  Patient ID and intended procedure confirmed with present staff. Received instructions for my participation in the procedure from the performing physician.  

## 2022-10-17 NOTE — Progress Notes (Signed)
To pacu, VSS. Report to [email or web address] 

## 2022-10-17 NOTE — Progress Notes (Signed)
VS completed by DT.  Pt's states no medical or surgical changes since previsit or office visit.  

## 2022-10-17 NOTE — Patient Instructions (Signed)
Handout on polyps and diverticulosis given to patient.  Await pathology results.  Resume previous diet and continue present medications.  Repeat colonoscopy for surveillance will be determined based off of pathology results.   YOU HAD AN ENDOSCOPIC PROCEDURE TODAY AT THE [facility]:   Refer to the procedure report that was given to you for any specific questions about what was found during the examination.  If the procedure report does not answer your questions, please call your gastroenterologist to clarify.  If you requested that your care partner not be given the details of your procedure findings, then the procedure report has been included in a sealed envelope for you to review at your convenience later.  YOU SHOULD EXPECT: Some feelings of bloating in the abdomen. Passage of more gas than usual.  Walking can help get rid of the air that was put into your GI tract during the procedure and reduce the bloating. If you had a lower endoscopy (such as a colonoscopy or flexible sigmoidoscopy) you may notice spotting of blood in your stool or on the toilet paper. If you underwent a bowel prep for your procedure, you may not have a normal bowel movement for a few days.  Please Note:  You might notice some irritation and congestion in your nose or some drainage.  This is from the oxygen used during your procedure.  There is no need for concern and it should clear up in a day or so.  SYMPTOMS TO REPORT IMMEDIATELY:  Following lower endoscopy (colonoscopy or flexible sigmoidoscopy):  Excessive amounts of blood in the stool  Significant tenderness or worsening of abdominal pains  Swelling of the abdomen that is new, acute  Fever of 100F or higher  For urgent or emergent issues, a gastroenterologist can be reached at any hour by calling [phone]. Do not use MyChart messaging for urgent concerns.    DIET:  We do recommend a small meal at first, but then you may proceed to your  regular diet.  Drink plenty of fluids but you should avoid alcoholic beverages for 24 hours.  ACTIVITY:  You should plan to take it easy for the rest of today and you should NOT DRIVE or use heavy machinery until tomorrow (because of the sedation medicines used during the test).    FOLLOW UP: Our staff will call the number listed on your records the next business day following your procedure.  We will call around 7:15- 8:00 am to check on you and address any questions or concerns that you may have regarding the information given to you following your procedure. If we do not reach you, we will leave a message.     If any biopsies were taken you will be contacted by phone or by letter within the next 1-3 weeks.  Please call us at [phone] if you have not heard about the biopsies in 3 weeks.    SIGNATURES/CONFIDENTIALITY: You and/or your care partner have signed paperwork which will be entered into your electronic medical record.  These signatures attest to the fact that that the information above on your After Visit Summary has been reviewed and is understood.  Full responsibility of the confidentiality of this discharge information lies with you and/or your care-partner. 

## 2022-10-17 NOTE — Progress Notes (Signed)
[facility] History and Physical   Primary Care Physician:  [name], FNP   Reason for Procedure:   Colon cancer screening  Plan:    Screening colonoscopy     HPI: Brittany Crawford is a 67 y.o. female undergoing average risk screening colonoscopy.  She has no family history of colon cancer and no chronic GI symptoms. She had a colonoscopy in 2013 with multiple rectal hyperplastic polyps.   Past Medical History:  Diagnosis Date   Chronic back pain    H/O sarcoidosis    Hypertension    Seasonal allergies    Stroke [facility])     Past Surgical History:  Procedure Laterality Date   BREAST BIOPSY Left 04/29/2016   BREAST BIOPSY Left 04/23/2016   COLONOSCOPY     DILATATION & CURRETTAGE/HYSTEROSCOPY WITH RESECTOCOPE N/A 12/10/2012   Procedure: DILATATION & CURETTAGE/HYSTEROSCOPY WITH RESECTOCOPE;  Surgeon: Marvene Staff, MD;  Location: [facility];  Service: Gynecology;  Laterality: N/A;   FOOT SURGERY     left-pins placed   LYMPH NODE BIOPSY     POLYPECTOMY N/A 12/10/2012   Procedure: POLYPECTOMY;  Surgeon: Marvene Staff, MD;  Location: [facility];  Service: Gynecology;  Laterality: N/A;   STRABISMUS SURGERY Left 04/17/2017   Procedure: REPAIR STRABISMUS LEFT EYE;  Surgeon: Everitt Amber, MD;  Location: [facility];  Service: Ophthalmology;  Laterality: Left;    Prior to Admission medications   Medication Sig Start Date End Date Taking? Authorizing Provider  Ascorbic Acid (VITAMIN C) 1000 MG tablet Take 1,000 mg by mouth daily. Takes when coming down with a cold.   Yes [provider]  azelastine (OPTIVAR) 0.05 % ophthalmic solution SMARTSIG:1 Drop(s) In Eye(s) Twice Daily PRN 09/28/20  Yes [provider]  dapagliflozin propanediol (FARXIGA) 10 MG TABS tablet TAKE 1 TABLET(10 MG) BY MOUTH DAILY BEFORE BREAKFAST 07/31/22  Yes [name], FNP  diclofenac sodium (VOLTAREN) 1 % GEL Apply 2 g topically 4 (four) times daily.  08/16/18  Yes Meredith Staggers, MD  HYDROcodone-acetaminophen (NORCO) 7.5-325 MG tablet Take 1 tablet by mouth every 8 (eight) hours as needed for moderate pain. Do Not Fill Before 11/15/2022 10/14/22  Yes Danella Sensing L, NP  latanoprost (XALATAN) 0.005 % ophthalmic solution 1 drop at bedtime. 12/24/21  Yes [provider]  losartan-hydrochlorothiazide (HYZAAR) 100-12.5 MG tablet Take 1 tablet by mouth daily. 09/26/22  Yes [name], FNP  Multiple Vitamin (MULTIVITAMIN WITH MINERALS) TABS tablet Take 1 tablet by mouth daily.   Yes [provider]  pravastatin (PRAVACHOL) 20 MG tablet Take 1 tablet (20 mg total) by mouth every evening. 08/18/22 08/18/23 Yes [name], FNP  blood glucose meter kit and supplies KIT Dispense based on patient and insurance preference. Use up to four times daily as directed. 07/31/22   [name], FNP  Hypromellose (ARTIFICIAL TEARS OP) Place 1 drop into the right eye daily as needed (for dry eyes).    [provider]  Olopatadine HCl 0.2 % SOLN Apply 1 drop to eye daily as needed for allergies. 06/18/17   [provider]  phenazopyridine (PYRIDIUM) 100 MG tablet Take 1 tablet (100 mg total) by mouth 3 (three) times daily as needed for pain. 03/18/22   [name], FNP  triamcinolone cream (KENALOG) 0.1 % Apply 1 Application topically at bedtime as needed (for rash). 08/11/22   [name], FNP    Current Outpatient Medications  Medication Sig Dispense Refill   Ascorbic Acid (VITAMIN  C) 1000 MG tablet Take 1,000 mg by mouth daily. Takes when coming down with a cold.     azelastine (OPTIVAR) 0.05 % ophthalmic solution SMARTSIG:1 Drop(s) In Eye(s) Twice Daily PRN     dapagliflozin propanediol (FARXIGA) 10 MG TABS tablet TAKE 1 TABLET(10 MG) BY MOUTH DAILY BEFORE BREAKFAST 90 tablet 2   diclofenac sodium (VOLTAREN) 1 % GEL Apply 2 g topically 4 (four) times daily. 300 g 2   HYDROcodone-acetaminophen (NORCO) 7.5-325 MG tablet Take  1 tablet by mouth every 8 (eight) hours as needed for moderate pain. Do Not Fill Before 11/15/2022 90 tablet 0   latanoprost (XALATAN) 0.005 % ophthalmic solution 1 drop at bedtime.     losartan-hydrochlorothiazide (HYZAAR) 100-12.5 MG tablet Take 1 tablet by mouth daily. 90 tablet 1   Multiple Vitamin (MULTIVITAMIN WITH MINERALS) TABS tablet Take 1 tablet by mouth daily.     pravastatin (PRAVACHOL) 20 MG tablet Take 1 tablet (20 mg total) by mouth every evening. 90 tablet 1   blood glucose meter kit and supplies KIT Dispense based on patient and insurance preference. Use up to four times daily as directed. 1 each 0   Hypromellose (ARTIFICIAL TEARS OP) Place 1 drop into the right eye daily as needed (for dry eyes).     Olopatadine HCl 0.2 % SOLN Apply 1 drop to eye daily as needed for allergies.  3   phenazopyridine (PYRIDIUM) 100 MG tablet Take 1 tablet (100 mg total) by mouth 3 (three) times daily as needed for pain. 10 tablet 0   triamcinolone cream (KENALOG) 0.1 % Apply 1 Application topically at bedtime as needed (for rash). 30 g 1   Current Facility-Administered Medications  Medication Dose Route Frequency Provider Last Rate Last Admin   0.9 %  sodium chloride infusion  500 mL Intravenous Once Daryel November, MD        Allergies as of 10/17/2022 - Review Complete 10/17/2022  Allergen Reaction Noted   Gadobenate Nausea And Vomiting 12/14/2018   Ivp dye [iodinated contrast media]  10/22/2021    Family History  Problem Relation Age of Onset   Stroke Mother    Colon cancer Maternal Uncle    Breast cancer Neg Hx     Social History   Socioeconomic History   Marital status: Married    Spouse name: Not on file   Number of children: 2   Years of education: 12   Highest education level: Not on file  Occupational History   Occupation: disability  Tobacco Use   Smoking status: Former    Packs/day: 0.00    Years: 0.00    Total pack years: 0.00    Types: Cigarettes    Quit  date: 03/13/2017    Years since quitting: 5.6   Smokeless tobacco: Never   Tobacco comments:    4  to 5  cigarettes daily for years  Vaping Use   Vaping Use: Never used  Substance and Sexual Activity   Alcohol use: Not Currently    Comment: occassionally   Drug use: Yes    Types: Hydrocodone   Sexual activity: Not Currently  Other Topics Concern   Not on file  Social History Narrative   Patient is married with 2 children.   Patient is right handed.   Patient has hs education.   Patient drinks 4 cups daily.   Social Determinants of Health   Financial Resource Strain: Low Risk  (07/31/2022)   Overall Financial Resource Strain (CARDIA)  Difficulty of Paying Living Expenses: Not hard at all  Food Insecurity: No Food Insecurity (07/31/2022)   Hunger Vital Sign    Worried About Running Out of Food in the Last Year: Never true    Ran Out of Food in the Last Year: Never true  Transportation Needs: No Transportation Needs (07/31/2022)   PRAPARE - Hydrologist (Medical): No    Lack of Transportation (Non-Medical): No  Physical Activity: Insufficiently Active (07/31/2022)   Exercise Vital Sign    Days of Exercise per Week: 3 days    Minutes of Exercise per Session: 10 min  Stress: No Stress Concern Present (07/31/2022)   [place]    Feeling of Stress : Not at all  Social Connections: Not on file  Intimate Partner Violence: Not At Risk (04/13/2019)   Humiliation, Afraid, Rape, and Kick questionnaire    Fear of Current or Ex-Partner: No    Emotionally Abused: No    Physically Abused: No    Sexually Abused: No    Review of Systems:  All other review of systems negative except as mentioned in the HPI.  Physical Exam: Vital signs BP 132/69   Pulse 88   Temp (!) 97.5 F (36.4 C) (Temporal)   Ht '5\' 8"'$  (1.727 m)   Wt 201 lb (91.2 kg)   LMP 11/13/2012   SpO2 97%   BMI 30.56 kg/m    General:   Alert,  Well-developed, well-nourished, pleasant and cooperative in NAD Airway:  Mallampati 2 Lungs:  Clear throughout to auscultation.   Heart:  Regular rate and rhythm; no murmurs, clicks, rubs,  or gallops. Abdomen:  Soft, nontender and nondistended. Normal bowel sounds.   Neuro/Psych:  Normal mood and affect. A and O x 3   Waylan Busta E. Candis Schatz, MD [facility]

## 2022-10-20 ENCOUNTER — Telehealth: Payer: Self-pay

## 2022-10-20 ENCOUNTER — Telehealth: Payer: Self-pay | Admitting: Registered Nurse

## 2022-10-20 DIAGNOSIS — M47816 Spondylosis without myelopathy or radiculopathy, lumbar region: Secondary | ICD-10-CM

## 2022-10-20 MED ORDER — HYDROCODONE-ACETAMINOPHEN 7.5-325 MG PO TABS: 1.0000 | ORAL_TABLET | Freq: Three times a day (TID) | ORAL | 0 refills | Status: DC | PRN

## 2022-10-20 NOTE — Telephone Encounter (Signed)
PMP was Reviewed.  [facility] called , they are stating they didn't receive the January prescription, medication list reviewed. Medication noted.  Prescription sent to pharmacy. Call placed to Brittany Crawford, regarding the above. She verbalizes understanding.

## 2022-10-20 NOTE — Telephone Encounter (Signed)
Patient needs January prescription for hydrocodone.  She said the pharmacy has February's but not January.  Please call patient.

## 2022-10-20 NOTE — Telephone Encounter (Signed)
  Follow up Call-     10/17/2022   10:48 AM  Call back number  Post procedure Call Back phone  # [phone]  Permission to leave phone message Yes     Patient questions:  Do you have a fever, pain , or abdominal swelling? No. Pain Score  0 *  Have you tolerated food without any problems? No.  Have you been able to return to your normal activities? No.  Do you have any questions about your discharge instructions: Diet   Yes.   Medications  Yes.   Follow up visit  Yes.    Do you have questions or concerns about your Care? No.  Actions: * If pain score is 4 or above: No action needed, pain <4.

## 2022-10-23 ENCOUNTER — Ambulatory Visit
Admission: RE | Admit: 2022-10-23 | Discharge: 2022-10-23 | Disposition: A | Discharge status: Home / Self Care | Payer: Medicare Other | Source: Ambulatory Visit | Attending: Nurse Practitioner | Admitting: Nurse Practitioner

## 2022-10-23 DIAGNOSIS — Z1231 Encounter for screening mammogram for malignant neoplasm of breast: Secondary | ICD-10-CM | POA: Diagnosis not present

## 2022-10-24 ENCOUNTER — Other Ambulatory Visit: Payer: Self-pay | Admitting: Nurse Practitioner

## 2022-10-24 DIAGNOSIS — R928 Other abnormal and inconclusive findings on diagnostic imaging of breast: Secondary | ICD-10-CM

## 2022-10-26 NOTE — Progress Notes (Signed)
Brittany Crawford,  Good news: the polyp (or polyps) that I removed during your recent examination were NOT precancerous.  You should continue to follow current colorectal cancer screening guidelines with a repeat colonoscopy in 10 years.    If you develop any new rectal bleeding, abdominal pain or significant bowel habit changes, please contact me before then.

## 2022-10-27 ENCOUNTER — Telehealth: Payer: Self-pay

## 2022-10-27 NOTE — Telephone Encounter (Signed)
Looked at dispense hx for losartan

## 2022-11-04 ENCOUNTER — Ambulatory Visit: Payer: Medicare Other

## 2022-11-04 ENCOUNTER — Ambulatory Visit
Admission: RE | Admit: 2022-11-04 | Discharge: 2022-11-04 | Disposition: A | Discharge status: Home / Self Care | Payer: 59 | Source: Ambulatory Visit | Attending: Nurse Practitioner | Admitting: Nurse Practitioner

## 2022-11-04 ENCOUNTER — Encounter: Payer: Self-pay | Admitting: Nurse Practitioner

## 2022-11-04 ENCOUNTER — Ambulatory Visit (INDEPENDENT_AMBULATORY_CARE_PROVIDER_SITE_OTHER): Payer: 59 | Admitting: Nurse Practitioner

## 2022-11-04 VITALS — BP 142/88 | HR 81 | Temp 98.9°F | Ht 68.0 in | Wt 201.0 lb

## 2022-11-04 DIAGNOSIS — I129 Hypertensive chronic kidney disease with stage 1 through stage 4 chronic kidney disease, or unspecified chronic kidney disease: Secondary | ICD-10-CM

## 2022-11-04 DIAGNOSIS — E1122 Type 2 diabetes mellitus with diabetic chronic kidney disease: Secondary | ICD-10-CM

## 2022-11-04 DIAGNOSIS — N1831 Chronic kidney disease, stage 3a: Secondary | ICD-10-CM

## 2022-11-04 DIAGNOSIS — Z8673 Personal history of transient ischemic attack (TIA), and cerebral infarction without residual deficits: Secondary | ICD-10-CM | POA: Diagnosis not present

## 2022-11-04 DIAGNOSIS — E782 Mixed hyperlipidemia: Secondary | ICD-10-CM

## 2022-11-04 DIAGNOSIS — I10 Essential (primary) hypertension: Secondary | ICD-10-CM

## 2022-11-04 DIAGNOSIS — H5712 Ocular pain, left eye: Secondary | ICD-10-CM

## 2022-11-04 DIAGNOSIS — Z6830 Body mass index (BMI) 30.0-30.9, adult: Secondary | ICD-10-CM

## 2022-11-04 DIAGNOSIS — R928 Other abnormal and inconclusive findings on diagnostic imaging of breast: Secondary | ICD-10-CM

## 2022-11-04 DIAGNOSIS — N183 Chronic kidney disease, stage 3 unspecified: Secondary | ICD-10-CM | POA: Diagnosis not present

## 2022-11-04 DIAGNOSIS — E6609 Other obesity due to excess calories: Secondary | ICD-10-CM

## 2022-11-04 LAB — LIPID PANEL
Chol/HDL Ratio: 3.3 ratio (ref 0.0–4.4)
Cholesterol, Total: 185 mg/dL (ref 100–199)
HDL: 56 mg/dL (ref >39)
LDL Chol Calc (NIH): 99 mg/dL (ref 0–99)
Triglycerides: 177 mg/dL — ABNORMAL HIGH (ref 0–149)
VLDL Cholesterol Cal: 30 mg/dL (ref 5–40)

## 2022-11-04 LAB — BMP8+EGFR
BUN/Creatinine Ratio: 14 (ref 12–28)
BUN: 14 mg/dL (ref 8–27)
CO2: 22 mmol/L (ref 20–29)
Calcium: 10.3 mg/dL (ref 8.7–10.3)
Chloride: 103 mmol/L (ref 96–106)
Creatinine, Ser: 0.98 mg/dL (ref 0.57–1.00)
Glucose: 101 mg/dL — ABNORMAL HIGH (ref 70–99)
Potassium: 3.8 mmol/L (ref 3.5–5.2)
Sodium: 140 mmol/L (ref 134–144)
eGFR: 64 mL/min/{1.73_m2} (ref >59)

## 2022-11-04 LAB — HEMOGLOBIN A1C
Est. average glucose Bld gHb Est-mCnc: 137 mg/dL
Hgb A1c MFr Bld: 6.4 % — ABNORMAL HIGH (ref 4.8–5.6)

## 2022-11-04 MED ORDER — ASPIRIN 81 MG PO TBEC: 81.0000 mg | DELAYED_RELEASE_TABLET | Freq: Every day | ORAL | 3 refills | Status: AC

## 2022-11-04 NOTE — Progress Notes (Addendum)
I,Brittany Crawford,acting as a Education administrator for [employer], FNP.,have documented all relevant documentation on the behalf of Brittany Brine, FNP,as directed by  Brittany Brine, FNP while in the presence of Brittany Crawford, [place]. Subjective:     Patient ID: Brittany Crawford , female    DOB: 28-Aug-1956 , 67 y.o.   MRN: [id number]   Chief Complaint  Patient presents with   Hypertension    HPI  The patient is here today for a blood pressure f/u. She has taken her blood pressure medications today. Patient reports she is taking her medications as directed. She reports she has extra medications since covid and she feels this is why the insurance thinks she is nonadherent.  She continues to take statin and overall doing well.   Wt Readings from Last 3 Encounters: 11/04/22 : 201 lb (91.2 kg) 10/17/22 : 201 lb (91.2 kg) 10/14/22 : 200 lb (90.7 kg)      Hypertension This is a chronic problem. The current episode started more than 1 year ago. The problem is unchanged. The problem is controlled. Pertinent negatives include no anxiety, chest pain, headaches or palpitations. There are no associated agents to hypertension. There are no known risk factors for coronary artery disease. Past treatments include calcium channel blockers and angiotensin blockers. The current treatment provides no improvement. There are no compliance problems.  Hypertensive end-organ damage includes kidney disease and CVA. There is no history of angina. Identifiable causes of hypertension include chronic renal disease.     Past Medical History:  Diagnosis Date   Chronic back pain    H/O sarcoidosis    Hypertension    Seasonal allergies    Stroke [facility])      Family History  Problem Relation Age of Onset   Stroke Mother    Colon cancer Maternal Uncle    Breast cancer Neg Hx      Current Outpatient Medications:    aspirin EC 81 MG tablet, Take 1 tablet (81 mg total) by mouth daily. Swallow whole., Disp: 90 tablet, Rfl: 3    Ascorbic Acid (VITAMIN C) 1000 MG tablet, Take 1,000 mg by mouth daily. Takes when coming down with a cold., Disp: , Rfl:    azelastine (OPTIVAR) 0.05 % ophthalmic solution, SMARTSIG:1 Drop(s) In Eye(s) Twice Daily PRN, Disp: , Rfl:    blood glucose meter kit and supplies KIT, Dispense based on patient and insurance preference. Use up to four times daily as directed., Disp: 1 each, Rfl: 0   dapagliflozin propanediol (FARXIGA) 10 MG TABS tablet, TAKE 1 TABLET(10 MG) BY MOUTH DAILY BEFORE BREAKFAST, Disp: 90 tablet, Rfl: 2   diclofenac sodium (VOLTAREN) 1 % GEL, Apply 2 g topically 4 (four) times daily., Disp: 300 g, Rfl: 2   HYDROcodone-acetaminophen (NORCO) 7.5-325 MG tablet, Take 1 tablet by mouth every 8 (eight) hours as needed for moderate pain., Disp: 90 tablet, Rfl: 0   Hypromellose (ARTIFICIAL TEARS OP), Place 1 drop into the right eye daily as needed (for dry eyes)., Disp: , Rfl:    latanoprost (XALATAN) 0.005 % ophthalmic solution, 1 drop at bedtime., Disp: , Rfl:    losartan-hydrochlorothiazide (HYZAAR) 100-12.5 MG tablet, Take 1 tablet by mouth daily., Disp: 90 tablet, Rfl: 1   Multiple Vitamin (MULTIVITAMIN WITH MINERALS) TABS tablet, Take 1 tablet by mouth daily., Disp: , Rfl:    Olopatadine HCl 0.2 % SOLN, Apply 1 drop to eye daily as needed for allergies., Disp: , Rfl: 3   phenazopyridine (PYRIDIUM) 100 MG  tablet, Take 1 tablet (100 mg total) by mouth 3 (three) times daily as needed for pain., Disp: 10 tablet, Rfl: 0   pravastatin (PRAVACHOL) 20 MG tablet, Take 1 tablet (20 mg total) by mouth every evening., Disp: 90 tablet, Rfl: 1   triamcinolone cream (KENALOG) 0.1 %, Apply 1 Application topically at bedtime as needed (for rash)., Disp: 30 g, Rfl: 1   Allergies  Allergen Reactions   Gadobenate Nausea And Vomiting    Pt was fine after a few minutes , vomiting after contrast injection smills    Ivp Dye [Iodinated Contrast Media]      Review of Systems  Constitutional: Negative.    Respiratory: Negative.    Cardiovascular: Negative.  Negative for chest pain, palpitations and leg swelling.  Gastrointestinal: Negative.   Neurological: Negative.  Negative for headaches.  Psychiatric/Behavioral: Negative.       Today's Vitals   11/04/22 0949 11/04/22 1032  BP: (!) 142/72 (!) 142/88  Pulse: 81   Temp: 98.9 F (37.2 C)   TempSrc: Oral   Weight: 201 lb (91.2 kg)   Height: '5\' 8"'$  (1.727 m)    Body mass index is 30.56 kg/m.   Objective:  Physical Exam Vitals reviewed.  Constitutional:      General: She is not in acute distress.    Appearance: Normal appearance. She is well-developed. She is obese.  HENT:     Head: Normocephalic and atraumatic.  Eyes:     Pupils: Pupils are equal, round, and reactive to light.  Cardiovascular:     Rate and Rhythm: Normal rate and regular rhythm.     Pulses: Normal pulses.     Heart sounds: Normal heart sounds. No murmur heard. Pulmonary:     Effort: Pulmonary effort is normal. No respiratory distress.     Breath sounds: Normal breath sounds. No wheezing.  Musculoskeletal:        General: Normal range of motion.  Skin:    General: Skin is warm and dry.     Capillary Refill: Capillary refill takes less than 2 seconds.  Neurological:     General: No focal deficit present.     Mental Status: She is alert and oriented to person, place, and time.     Cranial Nerves: No cranial nerve deficit.     Motor: No weakness.  Psychiatric:        Mood and Affect: Mood normal.         Assessment And Plan:     1. Essential Hypertension Comments: Blood pressure is elevated. Repeat remains elevated - Lipid panel - AMB Referral to Chronic Care Management Services  2. Type 2 diabetes mellitus with stage 3a chronic kidney disease, without long-term current use of insulin (HCC) Comments: Hemoglobin A1c is improved.  Continue current medications - Hemoglobin A1c - BMP8+EGFR - Lipid panel  3. Mixed hyperlipidemia Comments:  Cholesterol levels were still elevated, will continue current medications. - Lipid panel  4. Class 1 obesity due to excess calories with serious comorbidity and body mass index (BMI) of 30.0 to 30.9 in adult She is encouraged to strive for BMI less than 30 to decrease cardiac risk. Advised to aim for at least 150 minutes of exercise per week.  5. Left eye pain Comments: She is going to opthalmology to have evaluated.  6. History of CVA (cerebrovascular accident) Comments: She is to take aspirin 81 mg daily due to previous stroke.  Unsure of when she stopped or why she stopped however  off her med list in 2021     Patient was given opportunity to ask questions. Patient verbalized understanding of the plan and was able to repeat key elements of the plan. All questions were answered to their satisfaction.  Brittany Brine, FNP   I, Brittany Brine, FNP, have reviewed all documentation for this visit. The documentation on 11/04/22 for the exam, diagnosis, procedures, and orders are all accurate and complete.   IF YOU HAVE BEEN REFERRED TO A SPECIALIST, IT MAY TAKE 1-2 WEEKS TO SCHEDULE/PROCESS THE REFERRAL. IF YOU HAVE NOT HEARD FROM US/SPECIALIST IN TWO WEEKS, PLEASE GIVE [place] A CALL AT [phone] X [phone].   THE PATIENT IS ENCOURAGED TO PRACTICE SOCIAL DISTANCING DUE TO THE COVID-19 PANDEMIC.

## 2022-11-04 NOTE — Patient Instructions (Signed)
Hypertension, Adult High blood pressure (hypertension) is when the force of blood pumping through the arteries is too strong. The arteries are the blood vessels that carry blood from the heart throughout the body. Hypertension forces the heart to work harder to pump blood and may cause arteries to become narrow or stiff. Untreated or uncontrolled hypertension can lead to a heart attack, heart failure, a stroke, kidney disease, and other problems. A blood pressure reading consists of a higher number over a lower number. Ideally, your blood pressure should be below 120/80. The first ("top") number is called the systolic pressure. It is a measure of the pressure in your arteries as your heart beats. The second ("bottom") number is called the diastolic pressure. It is a measure of the pressure in your arteries as the heart relaxes. What are the causes? The exact cause of this condition is not known. There are some conditions that result in high blood pressure. What increases the risk? Certain factors may make you more likely to develop high blood pressure. Some of these risk factors are under your control, including: Smoking. Not getting enough exercise or physical activity. Being overweight. Having too much fat, sugar, calories, or salt (sodium) in your diet. Drinking too much alcohol. Other risk factors include: Having a personal history of heart disease, diabetes, high cholesterol, or kidney disease. Stress. Having a family history of high blood pressure and high cholesterol. Having obstructive sleep apnea. Age. The risk increases with age. What are the signs or symptoms? High blood pressure may not cause symptoms. Very high blood pressure (hypertensive crisis) may cause: Headache. Fast or irregular heartbeats (palpitations). Shortness of breath. Nosebleed. Nausea and vomiting. Vision changes. Severe chest pain, dizziness, and seizures. How is this diagnosed? This condition is diagnosed by  measuring your blood pressure while you are seated, with your arm resting on a flat surface, your legs uncrossed, and your feet flat on the floor. The cuff of the blood pressure monitor will be placed directly against the skin of your upper arm at the level of your heart. Blood pressure should be measured at least twice using the same arm. Certain conditions can cause a difference in blood pressure between your right and left arms. If you have a high blood pressure reading during one visit or you have normal blood pressure with other risk factors, you may be asked to: Return on a different day to have your blood pressure checked again. Monitor your blood pressure at home for 1 week or longer. If you are diagnosed with hypertension, you may have other blood or imaging tests to help your health care provider understand your overall risk for other conditions. How is this treated? This condition is treated by making healthy lifestyle changes, such as eating healthy foods, exercising more, and reducing your alcohol intake. You may be referred for counseling on a healthy diet and physical activity. Your health care provider may prescribe medicine if lifestyle changes are not enough to get your blood pressure under control and if: Your systolic blood pressure is above 130. Your diastolic blood pressure is above 80. Your personal target blood pressure may vary depending on your medical conditions, your age, and other factors. Follow these instructions at home: Eating and drinking  Eat a diet that is high in fiber and potassium, and low in sodium, added sugar, and fat. An example of this eating plan is called the DASH diet. DASH stands for Dietary Approaches to Stop Hypertension. To eat this way: Eat   plenty of fresh fruits and vegetables. Try to fill one half of your plate at each meal with fruits and vegetables. Eat whole grains, such as whole-wheat pasta, brown rice, or whole-grain bread. Fill about one  fourth of your plate with whole grains. Eat or drink low-fat dairy products, such as skim milk or low-fat yogurt. Avoid fatty cuts of meat, processed or cured meats, and poultry with skin. Fill about one fourth of your plate with lean proteins, such as fish, chicken without skin, beans, eggs, or tofu. Avoid pre-made and processed foods. These tend to be higher in sodium, added sugar, and fat. Reduce your daily sodium intake. Many people with hypertension should eat less than 1,500 mg of sodium a day. Do not drink alcohol if: Your health care provider tells you not to drink. You are pregnant, may be pregnant, or are planning to become pregnant. If you drink alcohol: Limit how much you have to: 0-1 drink a day for women. 0-2 drinks a day for men. Know how much alcohol is in your drink. In the [place] one drink equals one 12 oz bottle of beer (355 mL), one 5 oz glass of wine (148 mL), or one 1 oz glass of hard liquor (44 mL). Lifestyle  Work with your health care provider to maintain a healthy body weight or to lose weight. Ask what an ideal weight is for you. Get at least 30 minutes of exercise that causes your heart to beat faster (aerobic exercise) most days of the week. Activities may include walking, swimming, or biking. Include exercise to strengthen your muscles (resistance exercise), such as Pilates or lifting weights, as part of your weekly exercise routine. Try to do these types of exercises for 30 minutes at least 3 days a week. Do not use any products that contain nicotine or tobacco. These products include cigarettes, chewing tobacco, and vaping devices, such as e-cigarettes. If you need help quitting, ask your health care provider. Monitor your blood pressure at home as told by your health care provider. Keep all follow-up visits. This is important. Medicines Take over-the-counter and prescription medicines only as told by your health care provider. Follow directions carefully. Blood  pressure medicines must be taken as prescribed. Do not skip doses of blood pressure medicine. Doing this puts you at risk for problems and can make the medicine less effective. Ask your health care provider about side effects or reactions to medicines that you should watch for. Contact a health care provider if you: Think you are having a reaction to a medicine you are taking. Have headaches that keep coming back (recurring). Feel dizzy. Have swelling in your ankles. Have trouble with your vision. Get help right away if you: Develop a severe headache or confusion. Have unusual weakness or numbness. Feel faint. Have severe pain in your chest or abdomen. Vomit repeatedly. Have trouble breathing. These symptoms may be an emergency. Get help right away. Call 911. Do not wait to see if the symptoms will go away. Do not drive yourself to the hospital. Summary Hypertension is when the force of blood pumping through your arteries is too strong. If this condition is not controlled, it may put you at risk for serious complications. Your personal target blood pressure may vary depending on your medical conditions, your age, and other factors. For most people, a normal blood pressure is less than 120/80. Hypertension is treated with lifestyle changes, medicines, or a combination of both. Lifestyle changes include losing weight, eating a healthy,   low-sodium diet, exercising more, and limiting alcohol. This information is not intended to replace advice given to you by your health care provider. Make sure you discuss any questions you have with your health care provider. Document Revised: 08/06/2021 Document Reviewed: 08/06/2021 Elsevier Patient Education  2023 Elsevier Inc.  

## 2022-11-11 ENCOUNTER — Telehealth: Payer: Self-pay

## 2022-11-11 NOTE — Progress Notes (Signed)
   Care Guide Note  11/11/2022 Name: Brittany Crawford MRN: [id number] DOB: 02/24/1956  Referred by: [name], FNP Reason for referral : Chronic Care Management (Outreach to schedule referral )   Brittany Crawford is a 67 y.o. year old female who is a primary care patient of [name], [place]. [name] was referred to the pharmacist for assistance related to HTN.    Successful contact was made with the patient to discuss pharmacy services. Patient declines engagement at this time. Contact information was provided to the patient should they wish to reach out for assistance at a later time.  Noreene Larsson, [place], [place] [address] Direct Dial: [phone] [email or web address]

## 2022-11-11 NOTE — Progress Notes (Signed)
  Chronic Care Management   Note  11/11/2022 Name: ROBERTA ANGELL MRN: [id number] DOB: 01-24-1956  Brittany Crawford is a 67 y.o. year old female who is a primary care patient of [name], [place]. I reached out to Brittany Crawford by phone today in response to a referral sent by Ms. Dutch John PCP.  Ms. Testerman was given information about Chronic Care Management services today including:  CCM service includes personalized support from designated clinical staff supervised by the physician, including individualized plan of care and coordination with other care providers 24/7 contact phone numbers for assistance for urgent and routine care needs. Service will only be billed when office clinical staff spend 20 minutes or more in a month to coordinate care. Only one practitioner may furnish and bill the service in a calendar month. The patient may stop CCM services at amy time (effective at the end of the month) by phone call to the office staff. The patient will be responsible for cost sharing (co-pay) or up to 20% of the service fee (after annual deductible is met)  [name]  declinedto scheduling an appointment with the CCM RN Case Manager   Follow up plan: Patient did not agree to scheduling an appointment with the RN Case Manager. The ordering provider has been notified.   Noreene Larsson, [place], [place] [address] Direct Dial: [phone] [email or web address]

## 2022-12-11 ENCOUNTER — Encounter: Payer: 59 | Attending: Registered Nurse | Admitting: Registered Nurse

## 2022-12-11 ENCOUNTER — Encounter: Payer: Self-pay | Admitting: Registered Nurse

## 2022-12-11 VITALS — BP 134/82 | HR 77 | Ht 68.0 in | Wt 203.0 lb

## 2022-12-11 DIAGNOSIS — G894 Chronic pain syndrome: Secondary | ICD-10-CM | POA: Diagnosis not present

## 2022-12-11 DIAGNOSIS — Z5181 Encounter for therapeutic drug level monitoring: Secondary | ICD-10-CM | POA: Insufficient documentation

## 2022-12-11 DIAGNOSIS — M47816 Spondylosis without myelopathy or radiculopathy, lumbar region: Secondary | ICD-10-CM | POA: Insufficient documentation

## 2022-12-11 DIAGNOSIS — G8929 Other chronic pain: Secondary | ICD-10-CM | POA: Insufficient documentation

## 2022-12-11 DIAGNOSIS — Z79891 Long term (current) use of opiate analgesic: Secondary | ICD-10-CM | POA: Diagnosis not present

## 2022-12-11 DIAGNOSIS — M25561 Pain in right knee: Secondary | ICD-10-CM | POA: Diagnosis not present

## 2022-12-11 MED ORDER — HYDROCODONE-ACETAMINOPHEN 7.5-325 MG PO TABS: 1.0000 | ORAL_TABLET | Freq: Three times a day (TID) | ORAL | 0 refills | Status: DC | PRN

## 2022-12-11 NOTE — Progress Notes (Signed)
Subjective:    Patient ID: Brittany Crawford, female    DOB: 04-09-1956, 67 y.o.   MRN: [id number]  HPI: Brittany Crawford is a 67 y.o. female who returns for follow up appointment for chronic pain and medication refill. She states her pain is located in her lower back and right knee pain. She rates her pain 7. Her current exercise regime is walking and performing stretching exercises.  Ms. Waag Morphine equivalent is 22.50 MME.   Last UDS was Performed on 10/14/2022, it was consistent.      Pain Inventory Average Pain 8 Pain Right Now 7 My pain is intermittent, stabbing, and aching  In the last 24 hours, has pain interfered with the following? General activity 5 Relation with others 0 Enjoyment of life 8 What TIME of day is your pain at its worst? morning , daytime, evening, and night Sleep (in general) Fair  Pain is worse with: walking, bending, and standing Pain improves with: rest, heat/ice, therapy/exercise, and medication Relief from Meds: 9  Family History  Problem Relation Age of Onset   Stroke Mother    Colon cancer Maternal Uncle    Breast cancer Neg Hx    Social History   Socioeconomic History   Marital status: Married    Spouse name: Not on file   Number of children: 2   Years of education: 12   Highest education level: Not on file  Occupational History   Occupation: disability  Tobacco Use   Smoking status: Former    Packs/day: 0.00    Years: 0.00    Total pack years: 0.00    Types: Cigarettes    Quit date: 03/13/2017    Years since quitting: 5.7   Smokeless tobacco: Never   Tobacco comments:    4  to 5  cigarettes daily for years  Vaping Use   Vaping Use: Never used  Substance and Sexual Activity   Alcohol use: Not Currently    Comment: occassionally   Drug use: Yes    Types: Hydrocodone   Sexual activity: Not Currently  Other Topics Concern   Not on file  Social History Narrative   Patient is married with 2 children.   Patient is  right handed.   Patient has hs education.   Patient drinks 4 cups daily.   Social Determinants of Health   Financial Resource Strain: Low Risk  (07/31/2022)   Overall Financial Resource Strain (CARDIA)    Difficulty of Paying Living Expenses: Not hard at all  Food Insecurity: No Food Insecurity (07/31/2022)   Hunger Vital Sign    Worried About Running Out of Food in the Last Year: Never true    Ran Out of Food in the Last Year: Never true  Transportation Needs: No Transportation Needs (07/31/2022)   PRAPARE - Hydrologist (Medical): No    Lack of Transportation (Non-Medical): No  Physical Activity: Insufficiently Active (07/31/2022)   Exercise Vital Sign    Days of Exercise per Week: 3 days    Minutes of Exercise per Session: 10 min  Stress: No Stress Concern Present (07/31/2022)   [place]    Feeling of Stress : Not at all  Social Connections: Not on file   Past Surgical History:  Procedure Laterality Date   BREAST BIOPSY Left 04/29/2016   BREAST BIOPSY Left 04/23/2016   COLONOSCOPY     DILATATION & CURRETTAGE/HYSTEROSCOPY WITH RESECTOCOPE N/A  12/10/2012   Procedure: DILATATION & CURETTAGE/HYSTEROSCOPY WITH RESECTOCOPE;  Surgeon: Marvene Staff, MD;  Location: [facility];  Service: Gynecology;  Laterality: N/A;   FOOT SURGERY     left-pins placed   LYMPH NODE BIOPSY     POLYPECTOMY N/A 12/10/2012   Procedure: POLYPECTOMY;  Surgeon: Marvene Staff, MD;  Location: [facility];  Service: Gynecology;  Laterality: N/A;   STRABISMUS SURGERY Left 04/17/2017   Procedure: REPAIR STRABISMUS LEFT EYE;  Surgeon: Everitt Amber, MD;  Location: [facility];  Service: Ophthalmology;  Laterality: Left;   Past Surgical History:  Procedure Laterality Date   BREAST BIOPSY Left 04/29/2016   BREAST BIOPSY Left 04/23/2016   COLONOSCOPY     DILATATION & CURRETTAGE/HYSTEROSCOPY WITH  RESECTOCOPE N/A 12/10/2012   Procedure: DILATATION & CURETTAGE/HYSTEROSCOPY WITH RESECTOCOPE;  Surgeon: Marvene Staff, MD;  Location: [facility];  Service: Gynecology;  Laterality: N/A;   FOOT SURGERY     left-pins placed   LYMPH NODE BIOPSY     POLYPECTOMY N/A 12/10/2012   Procedure: POLYPECTOMY;  Surgeon: Marvene Staff, MD;  Location: [facility];  Service: Gynecology;  Laterality: N/A;   STRABISMUS SURGERY Left 04/17/2017   Procedure: REPAIR STRABISMUS LEFT EYE;  Surgeon: Everitt Amber, MD;  Location: [facility];  Service: Ophthalmology;  Laterality: Left;   Past Medical History:  Diagnosis Date   Chronic back pain    H/O sarcoidosis    Hypertension    Seasonal allergies    Stroke ([place])    LMP 11/13/2012   Opioid Risk Score:   Fall Risk Score:  `1  Depression screen [facility] 2/9     11/04/2022    9:43 AM 10/14/2022   10:41 AM 08/14/2022    9:40 AM 07/31/2022   11:40 AM 07/31/2022   10:45 AM 06/13/2022    9:23 AM 04/17/2022   10:26 AM  Depression screen [facility] 2/9  Decreased Interest 0 0 0 0 0 0 0  Down, Depressed, Hopeless 0 0 0 0 0 0 0  PHQ - 2 Score 0 0 0 0 0 0 0    Review of Systems  Constitutional:  Positive for diaphoresis.  Musculoskeletal:  Positive for back pain.       Right knee pain  All other systems reviewed and are negative.     Objective:   Physical Exam Vitals and nursing note reviewed.  Constitutional:      Appearance: Normal appearance.  Cardiovascular:     Rate and Rhythm: Normal rate and regular rhythm.     Pulses: Normal pulses.     Heart sounds: Normal heart sounds.  Pulmonary:     Effort: Pulmonary effort is normal.     Breath sounds: Normal breath sounds.  Musculoskeletal:     Cervical back: Normal range of motion and neck supple.     Comments: Normal Muscle Bulk and Muscle Testing Reveals:  Upper Extremities: Full ROM and Muscle Strength 5/5  Lumbar Paraspinal Tenderness: L-3-L-5 Lower Extremities: Full ROM and Muscle  Strength 5/5 Arises From Table with ease Narrow Based  Gait     Skin:    General: Skin is warm and dry.  Neurological:     Mental Status: She is alert and oriented to person, place, and time.  Psychiatric:        Mood and Affect: Mood normal.        Behavior: Behavior normal.         Assessment & Plan:  1. Lumbar spondylosis with DDD and facet arthropathy.Left Lumbar Radiculitis: Continue HEP as Tolerated. Continue to Monitor.  12/11/2022 Refilled: Hydrocodone 7.5 /325 mg one tablet every 8 hours as needed for pain.  #90 tablets. . Second script e-scribe for the following month. We will continue the opioid monitoring program, this consists of regular clinic visits, examinations, urine drug screen, pill counts as well as use of [place] Controlled Substance Reporting system. A 12 month History has been reviewed on the [place] on 12/11/2022. 2. Chronic Right l knee pain:  Continue with heat/ice, exercise and Diclofenac. 12/11/2022 3. Right thalamic/internal capsule lacunar infarct with persistent left hemisensory deficits: Neurology Following. Continue to Monitor. 12/11/2022. 4.Left Greater Trochanteric Bursitis: no complaints today. Continue HEP as Tolerated and Alternate Ice and Heat Therapy.  Continue current medication regime. Continue to Monitor.  12/11/2022. 5. Polyarthralgia: Continue HEP as Tolerated. Continue to monitor. 12/11/2022 6. Cervicalgia: No complaints  today.  Continue to alternate with heat and Ice therapy. Continue to Monitor.  12/11/2022   F/U in 2 months

## 2022-12-16 DIAGNOSIS — H209 Unspecified iridocyclitis: Secondary | ICD-10-CM | POA: Diagnosis not present

## 2022-12-16 DIAGNOSIS — H25813 Combined forms of age-related cataract, bilateral: Secondary | ICD-10-CM | POA: Diagnosis not present

## 2022-12-16 DIAGNOSIS — H40023 Open angle with borderline findings, high risk, bilateral: Secondary | ICD-10-CM | POA: Diagnosis not present

## 2022-12-16 DIAGNOSIS — H4422 Degenerative myopia, left eye: Secondary | ICD-10-CM | POA: Diagnosis not present

## 2022-12-16 DIAGNOSIS — H30142 Acute posterior multifocal placoid pigment epitheliopathy, left eye: Secondary | ICD-10-CM | POA: Diagnosis not present

## 2022-12-16 DIAGNOSIS — H5712 Ocular pain, left eye: Secondary | ICD-10-CM | POA: Diagnosis not present

## 2022-12-16 LAB — HM DIABETES EYE EXAM

## 2022-12-22 ENCOUNTER — Telehealth: Payer: Self-pay

## 2022-12-22 NOTE — Telephone Encounter (Signed)
Patient called to report she had recent eye exam ([facility]) and they are recommending she see Rheumatology due to her sarcoidosis. She states she would like to see Dr. Kathlene November at [facility].   Please advise, thanks!

## 2022-12-23 DIAGNOSIS — H5712 Ocular pain, left eye: Secondary | ICD-10-CM | POA: Diagnosis not present

## 2022-12-23 DIAGNOSIS — H209 Unspecified iridocyclitis: Secondary | ICD-10-CM | POA: Diagnosis not present

## 2022-12-31 NOTE — Telephone Encounter (Signed)
Patient calling to follow up on referral request.

## 2023-01-06 ENCOUNTER — Encounter (HOSPITAL_COMMUNITY): Payer: Self-pay | Admitting: *Deleted

## 2023-01-06 ENCOUNTER — Other Ambulatory Visit (HOSPITAL_COMMUNITY): Payer: Self-pay | Admitting: Family Medicine

## 2023-01-06 ENCOUNTER — Ambulatory Visit (HOSPITAL_COMMUNITY)
Admission: EM | Admit: 2023-01-06 | Discharge: 2023-01-06 | Disposition: A | Discharge status: Home / Self Care | Payer: 59 | Attending: Family Medicine | Admitting: Family Medicine

## 2023-01-06 DIAGNOSIS — N644 Mastodynia: Secondary | ICD-10-CM

## 2023-01-06 DIAGNOSIS — N61 Mastitis without abscess: Secondary | ICD-10-CM

## 2023-01-06 MED ORDER — KETOROLAC TROMETHAMINE 30 MG/ML IJ SOLN: 30.0000 mg | Freq: Once | INTRAMUSCULAR | Status: DC

## 2023-01-06 MED ORDER — IBUPROFEN 800 MG PO TABS: 800.0000 mg | ORAL_TABLET | Freq: Three times a day (TID) | ORAL | 0 refills | Status: DC | PRN

## 2023-01-06 MED ORDER — AMOXICILLIN-POT CLAVULANATE 875-125 MG PO TABS: 1.0000 | ORAL_TABLET | Freq: Two times a day (BID) | ORAL | 0 refills | Status: AC

## 2023-01-06 MED ORDER — KETOROLAC TROMETHAMINE 30 MG/ML IJ SOLN
INTRAMUSCULAR | Status: AC
  Filled 2023-01-06: qty 1

## 2023-01-06 NOTE — Discharge Instructions (Addendum)
Take amoxicillin-clavulanate 875 mg--1 tab twice daily with food for 7 days  Take ibuprofen 800 mg--1 tab every 8 hours as needed for pain.  You have been given a shot of Toradol 30 mg today.  If you worsen anyway or your pain is not improving at all in the next 2 days, please proceed to the emergency room for further evaluation

## 2023-01-06 NOTE — ED Triage Notes (Signed)
Pt states she has left breast pain since last Wednesday, she has been icing it. She did have a normal mammogram recently. She states that the left breast hurts even to touch.

## 2023-01-06 NOTE — ED Provider Notes (Addendum)
[facility]    CSN: [id number] Arrival date & time: 01/06/23  [id number]      History   Chief Complaint Chief Complaint  Patient presents with   Breast Pain    HPI Brittany Crawford is a 67 y.o. female.   HPI Here for pain and soreness in her left breast.  She began noticing the symptoms on March 21.  Symptoms have worsened since then, though she feels like her left breast is a little less swollen today.  It is very tender to touch near the left nipple.  No fever or chills or nausea or vomiting She did have a mammogram in January and diagnostic mammogram after that was negative.  The diagnostic mammogram was done of the left breast   Past Medical History:  Diagnosis Date   Chronic back pain    H/O sarcoidosis    Hypertension    Seasonal allergies    Stroke [facility])     Patient Active Problem List   Diagnosis Date Noted   Sarcoidosis 04/23/2021   Vaginal atrophy 06/21/2020   Urinary frequency 01/05/2020   Cervical spondylosis without myelopathy 12/07/2019   Vaginal itching 01/26/2019   Cervicogenic headache 01/25/2019   Stage 3 chronic kidney disease ([place]) 12/09/2018   Acute vaginitis 09/15/2018   Dysuria 09/15/2018   Neuropathic pain 10/03/2015   Chronic pain syndrome 08/03/2015   Right-sided lacunar infarction ([place]) 12/04/2014   Spondylosis of lumbar region without myelopathy or radiculopathy 12/04/2014   Lumbar facet arthropathy 12/04/2014   Marijuana abuse 07/07/2014   Mixed hyperlipidemia 07/07/2014   CVA (cerebral infarction) 07/06/2014   Essential hypertension, benign 07/06/2014    Past Surgical History:  Procedure Laterality Date   BREAST BIOPSY Left 04/29/2016   BREAST BIOPSY Left 04/23/2016   COLONOSCOPY     DILATATION & CURRETTAGE/HYSTEROSCOPY WITH RESECTOCOPE N/A 12/10/2012   Procedure: DILATATION & CURETTAGE/HYSTEROSCOPY WITH RESECTOCOPE;  Surgeon: Marvene Staff, MD;  Location: [facility];  Service: Gynecology;  Laterality: N/A;   FOOT  SURGERY     left-pins placed   LYMPH NODE BIOPSY     POLYPECTOMY N/A 12/10/2012   Procedure: POLYPECTOMY;  Surgeon: Marvene Staff, MD;  Location: [facility];  Service: Gynecology;  Laterality: N/A;   STRABISMUS SURGERY Left 04/17/2017   Procedure: REPAIR STRABISMUS LEFT EYE;  Surgeon: Everitt Amber, MD;  Location: [facility];  Service: Ophthalmology;  Laterality: Left;    OB History     Gravida  5   Para  2   Term  2   Preterm      AB  3   Living  2      SAB      IAB  3   Ectopic      Multiple      Live Births               Home Medications    Prior to Admission medications   Medication Sig Start Date End Date Taking? Authorizing Provider  amoxicillin-clavulanate (AUGMENTIN) 875-125 MG tablet Take 1 tablet by mouth 2 (two) times daily for 7 days. 01/06/23 01/13/23 Yes Herb Beltre, Gwenlyn Perking, MD  Ascorbic Acid (VITAMIN C) 1000 MG tablet Take 1,000 mg by mouth daily. Takes when coming down with a cold.   Yes [provider]  aspirin EC 81 MG tablet Take 1 tablet (81 mg total) by mouth daily. Swallow whole. 11/04/22 11/04/23 Yes [name], FNP  azelastine (OPTIVAR) 0.05 % ophthalmic solution SMARTSIG:1  Drop(s) In Eye(s) Twice Daily PRN 09/28/20  Yes [provider]  blood glucose meter kit and supplies KIT Dispense based on patient and insurance preference. Use up to four times daily as directed. 07/31/22  Yes [name], FNP  dapagliflozin propanediol (FARXIGA) 10 MG TABS tablet TAKE 1 TABLET(10 MG) BY MOUTH DAILY BEFORE BREAKFAST 07/31/22  Yes [name], FNP  diclofenac sodium (VOLTAREN) 1 % GEL Apply 2 g topically 4 (four) times daily. 08/16/18  Yes Meredith Staggers, MD  HYDROcodone-acetaminophen (NORCO) 7.5-325 MG tablet Take 1 tablet by mouth every 8 (eight) hours as needed for moderate pain. 12/11/22  Yes Bayard Hugger, NP  Hypromellose (ARTIFICIAL TEARS OP) Place 1 drop into the right eye daily as needed (for dry eyes).    Yes [provider]  ibuprofen (ADVIL) 800 MG tablet Take 1 tablet (800 mg total) by mouth every 8 (eight) hours as needed (pain). 01/06/23  Yes Landry Kamath, Gwenlyn Perking, MD  latanoprost (XALATAN) 0.005 % ophthalmic solution 1 drop at bedtime. 12/24/21  Yes [provider]  losartan-hydrochlorothiazide (HYZAAR) 100-12.5 MG tablet Take 1 tablet by mouth daily. 09/26/22  Yes [name], FNP  Multiple Vitamin (MULTIVITAMIN WITH MINERALS) TABS tablet Take 1 tablet by mouth daily.   Yes [provider]  Olopatadine HCl 0.2 % SOLN Apply 1 drop to eye daily as needed for allergies. 06/18/17  Yes [provider]  pravastatin (PRAVACHOL) 20 MG tablet Take 1 tablet (20 mg total) by mouth every evening. 08/18/22 08/18/23 Yes [name], FNP  triamcinolone cream (KENALOG) 0.1 % Apply 1 Application topically at bedtime as needed (for rash). 08/11/22  Yes [name], FNP    Family History Family History  Problem Relation Age of Onset   Stroke Mother    Colon cancer Maternal Uncle    Breast cancer Neg Hx     Social History Social History   Tobacco Use   Smoking status: Former    Packs/day: 0.00    Years: 0.00    Additional pack years: 0.00    Total pack years: 0.00    Types: Cigarettes    Quit date: 03/13/2017    Years since quitting: 5.8   Smokeless tobacco: Never   Tobacco comments:    4  to 5  cigarettes daily for years  Vaping Use   Vaping Use: Never used  Substance Use Topics   Alcohol use: Not Currently    Comment: occassionally   Drug use: Yes    Types: Hydrocodone     Allergies   Gadobenate and Ivp dye [iodinated contrast media]   Review of Systems Review of Systems   Physical Exam Triage Vital Signs ED Triage Vitals  Enc Vitals Group     BP 01/06/23 0920 126/67     Pulse Rate 01/06/23 0920 83     Resp 01/06/23 0920 18     Temp 01/06/23 0920 98.8 F (37.1 C)     Temp Source 01/06/23 0920 Oral     SpO2 01/06/23 0920 95 %      Weight --      Height --      Head Circumference --      Peak Flow --      Pain Score 01/06/23 0919 10     Pain Loc --      Pain Edu? --      Excl. in [place]? --    No data found.  Updated Vital Signs BP 126/67 (BP Location:  Left Arm)   Pulse 83   Temp 98.8 F (37.1 C) (Oral)   Resp 18   LMP 11/13/2012   SpO2 95%   Visual Acuity Right Eye Distance:   Left Eye Distance:   Bilateral Distance:    Right Eye Near:   Left Eye Near:    Bilateral Near:     Physical Exam Vitals reviewed.  Constitutional:      General: She is not in acute distress.    Appearance: She is not ill-appearing, toxic-appearing or diaphoretic.  Cardiovascular:     Rate and Rhythm: Normal rate and regular rhythm.  Pulmonary:     Effort: Pulmonary effort is normal.     Breath sounds: Normal breath sounds.  Chest:     Comments:  there is erythema that is mild and induration in the left breast in the upper pole of the nipple.  There is no fluctuance.  It is very tender there.  The induration extends about 5 cm in diameter Neurological:     Mental Status: She is alert.      UC Treatments / Results  Labs (all labs ordered are listed, but only abnormal results are displayed) Labs Reviewed - No data to display  EKG   Radiology No results found.  Procedures Procedures (including critical care time)  Medications Ordered in UC Medications  ketorolac (TORADOL) 30 MG/ML injection 30 mg (has no administration in time range)    Initial Impression / Assessment and Plan / UC Course  I have reviewed the triage vital signs and the nursing notes.  Pertinent labs & imaging results that were available during my care of the patient were reviewed by me and considered in my medical decision making (see chart for details).        Toradol was given here for pain and ibuprofen is sent in.  Her last EGFR was normal in January 2024.  Augmentin is sent in for the mastitis.  Diagnostic mammogram and ultrasound  ordered due to the new pain and swelling.  If she worsens anyway, she is to proceed to the emergency room  Patient had told me that she would except the Toradol injection, but then when nursing staff went to give it to her, she had changed her mind and declined the Toradol. Final Clinical Impressions(s) / UC Diagnoses   Final diagnoses:  Mastitis  Breast pain, left     Discharge Instructions      Take amoxicillin-clavulanate 875 mg--1 tab twice daily with food for 7 days  Take ibuprofen 800 mg--1 tab every 8 hours as needed for pain.  You have been given a shot of Toradol 30 mg today.  If you worsen anyway or your pain is not improving at all in the next 2 days, please proceed to the emergency room for further evaluation      ED Prescriptions     Medication Sig Dispense Auth. Provider   amoxicillin-clavulanate (AUGMENTIN) 875-125 MG tablet Take 1 tablet by mouth 2 (two) times daily for 7 days. 14 tablet Shai Mckenzie, Gwenlyn Perking, MD   ibuprofen (ADVIL) 800 MG tablet Take 1 tablet (800 mg total) by mouth every 8 (eight) hours as needed (pain). 21 tablet Tyffany Waldrop, Gwenlyn Perking, MD      PDMP not reviewed this encounter.   Barrett Henle, MD 01/06/23 [phone]    Barrett Henle, MD 01/06/23 1018

## 2023-01-08 ENCOUNTER — Other Ambulatory Visit: Payer: Self-pay | Admitting: Nurse Practitioner

## 2023-01-08 DIAGNOSIS — Z862 Personal history of diseases of the blood and blood-forming organs and certain disorders involving the immune mechanism: Secondary | ICD-10-CM

## 2023-01-12 ENCOUNTER — Ambulatory Visit
Admission: RE | Admit: 2023-01-12 | Discharge: 2023-01-12 | Disposition: A | Discharge status: Home / Self Care | Payer: 59 | Source: Ambulatory Visit | Attending: Family Medicine | Admitting: Family Medicine

## 2023-01-12 ENCOUNTER — Other Ambulatory Visit (HOSPITAL_COMMUNITY): Payer: Self-pay | Admitting: Family Medicine

## 2023-01-12 DIAGNOSIS — N611 Abscess of the breast and nipple: Secondary | ICD-10-CM

## 2023-01-12 DIAGNOSIS — N644 Mastodynia: Secondary | ICD-10-CM | POA: Diagnosis not present

## 2023-01-14 ENCOUNTER — Ambulatory Visit
Admission: RE | Admit: 2023-01-14 | Discharge: 2023-01-14 | Disposition: A | Discharge status: Home / Self Care | Payer: 59 | Source: Ambulatory Visit | Attending: Family Medicine | Admitting: Family Medicine

## 2023-01-14 DIAGNOSIS — N611 Abscess of the breast and nipple: Secondary | ICD-10-CM

## 2023-01-14 DIAGNOSIS — N644 Mastodynia: Secondary | ICD-10-CM

## 2023-01-15 ENCOUNTER — Other Ambulatory Visit: Payer: Self-pay | Admitting: Family Medicine

## 2023-01-15 DIAGNOSIS — N611 Abscess of the breast and nipple: Secondary | ICD-10-CM

## 2023-01-19 DIAGNOSIS — H209 Unspecified iridocyclitis: Secondary | ICD-10-CM | POA: Diagnosis not present

## 2023-01-19 DIAGNOSIS — H5712 Ocular pain, left eye: Secondary | ICD-10-CM | POA: Diagnosis not present

## 2023-01-19 LAB — AEROBIC/ANAEROBIC CULTURE W GRAM STAIN (SURGICAL/DEEP WOUND): Special Requests: NORMAL

## 2023-01-21 ENCOUNTER — Ambulatory Visit: Payer: 59 | Admitting: Nurse Practitioner

## 2023-01-21 ENCOUNTER — Telehealth: Payer: Self-pay

## 2023-01-21 NOTE — Telephone Encounter (Signed)
Patient called to report she was seen at [facility] 01/06/23 for breast concern. She was diagnosed with mastitis and put on amoxicillin. [facility] and they then placed her on doxycycline. She has developed a yeast infection as a result of the multiple atbx. She contacted the [facility] and they have refused to send in a rx for her. She has tried Miconazole 7-day treatment with little improvement. She would like to know if you can send in a rx for her.  FYI patient is scheduled for OV tomorrow 4/11 @ 9am  Please advise. Thanks!

## 2023-01-22 ENCOUNTER — Ambulatory Visit (INDEPENDENT_AMBULATORY_CARE_PROVIDER_SITE_OTHER): Payer: 59 | Admitting: Nurse Practitioner

## 2023-01-22 ENCOUNTER — Encounter: Payer: Self-pay | Admitting: Nurse Practitioner

## 2023-01-22 VITALS — BP 122/68 | HR 78 | Temp 99.1°F | Ht 68.0 in | Wt 204.0 lb

## 2023-01-22 DIAGNOSIS — N760 Acute vaginitis: Secondary | ICD-10-CM | POA: Diagnosis not present

## 2023-01-22 MED ORDER — FLUCONAZOLE 100 MG PO TABS: 100.0000 mg | ORAL_TABLET | Freq: Every day | ORAL | 0 refills | Status: DC

## 2023-01-22 NOTE — Progress Notes (Signed)
I,Sheena H Holbrook,acting as a Neurosurgeon for Arnette Felts, FNP.,have documented all relevant documentation on the behalf of Arnette Felts, FNP,as directed by  Arnette Felts, FNP while in the presence of Arnette Felts, FNP.    Subjective:     Patient ID: Brittany Crawford , female    DOB: 1956-03-23 , 67 y.o.   MRN: [id number]   Chief Complaint  Patient presents with   Mastitis   Vaginal Itching    HPI  Patient presents today for follow up on mastitis in the left breast. Initially was not able to lay on her breast due to the pain and swelling. Patient was seen in UC on 01/06/23, [facility]. She was given 2 antibiotics, Amoxicillin and Doxycycline. Symptoms started after the 5th day of her 2nd round of her antibiotics. She feels she may have a yeast infection from these.  She has tried over the [employer]administrative [employer]. She has brown discharge. Has itching to her vaginal area.   She has an appt with Rheumatologist for her eye issues possibly related to her sarcoidosis.      Past Medical History:  Diagnosis Date   Chronic back pain    H/O sarcoidosis    Hypertension    Seasonal allergies    Stroke      Family History  Problem Relation Age of Onset   Stroke Mother    Colon cancer Maternal Uncle    Breast cancer Neg Hx      Current Outpatient Medications:    Ascorbic Acid (VITAMIN C) 1000 MG tablet, Take 1,000 mg by mouth daily. Takes when coming down with a cold., Disp: , Rfl:    aspirin EC 81 MG tablet, Take 1 tablet (81 mg total) by mouth daily. Swallow whole., Disp: 90 tablet, Rfl: 3   azelastine (OPTIVAR) 0.05 % ophthalmic solution, SMARTSIG:1 Drop(s) In Eye(s) Twice Daily PRN, Disp: , Rfl:    blood glucose meter kit and supplies KIT, Dispense based on patient and insurance preference. Use up to four times daily as directed., Disp: 1 each, Rfl: 0   dapagliflozin propanediol (FARXIGA) 10 MG TABS tablet, TAKE 1 TABLET(10 MG) BY MOUTH DAILY BEFORE  BREAKFAST, Disp: 90 tablet, Rfl: 2   diclofenac sodium (VOLTAREN) 1 % GEL, Apply 2 g topically 4 (four) times daily., Disp: 300 g, Rfl: 2   doxycycline (VIBRA-TABS) 100 MG tablet, Take 100 mg by mouth 2 (two) times daily., Disp: , Rfl:    fluconazole (DIFLUCAN) 100 MG tablet, Take 1 tablet (100 mg total) by mouth daily. Take 1 tablet by mouth now repeat in 5 days, Disp: 2 tablet, Rfl: 0   HYDROcodone-acetaminophen (NORCO) 7.5-325 MG tablet, Take 1 tablet by mouth every 8 (eight) hours as needed for moderate pain., Disp: 90 tablet, Rfl: 0   Hypromellose (ARTIFICIAL TEARS OP), Place 1 drop into the right eye daily as needed (for dry eyes)., Disp: , Rfl:    ibuprofen (ADVIL) 800 MG tablet, Take 1 tablet (800 mg total) by mouth every 8 (eight) hours as needed (pain)., Disp: 21 tablet, Rfl: 0   latanoprost (XALATAN) 0.005 % ophthalmic solution, 1 drop at bedtime., Disp: , Rfl:    losartan-hydrochlorothiazide (HYZAAR) 100-12.5 MG tablet, Take 1 tablet by mouth daily., Disp: 90 tablet, Rfl: 1   Multiple Vitamin (MULTIVITAMIN WITH MINERALS) TABS tablet, Take 1 tablet by mouth daily., Disp: , Rfl:    Olopatadine HCl 0.2 % SOLN, Apply 1 drop to eye daily as  needed for allergies., Disp: , Rfl: 3   pravastatin (PRAVACHOL) 20 MG tablet, Take 1 tablet (20 mg total) by mouth every evening., Disp: 90 tablet, Rfl: 1   triamcinolone cream (KENALOG) 0.1 %, Apply 1 Application topically at bedtime as needed (for rash)., Disp: 30 g, Rfl: 1   Allergies  Allergen Reactions   Gadobenate Nausea And Vomiting    Pt was fine after a few minutes , vomiting after contrast injection smills    Ivp Dye [Iodinated Contrast Media]      Review of Systems  Genitourinary:  Positive for vaginal discharge.  All other systems reviewed and are negative.    Today's Vitals   01/22/23 0904  BP: 122/68  Pulse: 78  Temp: 99.1 F (37.3 C)  TempSrc: Oral  SpO2: 95%  Weight: 204 lb (92.5 kg)  Height: 5\' 8"  (1.727 m)   Body mass  index is 31.02 kg/m.   Objective:  Physical Exam Vitals reviewed.  Pulmonary:     Effort: Pulmonary effort is normal. No respiratory distress.  Skin:    General: Skin is warm and dry.     Capillary Refill: Capillary refill takes less than 2 seconds.  Neurological:     General: No focal deficit present.     Mental Status: She is alert and oriented to person, place, and time.     Cranial Nerves: No cranial nerve deficit.     Motor: No weakness.         Assessment And Plan:     1. Acute vaginitis Comments: Vaginal irritation after taking antibiotics for mastitis, will treat with diflucan - fluconazole (DIFLUCAN) 100 MG tablet; Take 1 tablet (100 mg total) by mouth daily. Take 1 tablet by mouth now repeat in 5 days  Dispense: 2 tablet; Refill: 0     Patient was given opportunity to ask questions. Patient verbalized understanding of the plan and was able to repeat key elements of the plan. All questions were answered to their satisfaction.  Arnette FeltsJanece Bucky Grigg, FNP   I, Arnette FeltsJanece Narjis Mira, FNP, have reviewed all documentation for this visit. The documentation on 01/22/23 for the exam, diagnosis, procedures, and orders are all accurate and complete.   IF YOU HAVE BEEN REFERRED TO A SPECIALIST, IT MAY TAKE 1-2 WEEKS TO SCHEDULE/PROCESS THE REFERRAL. IF YOU HAVE NOT HEARD FROM US/SPECIALIST IN TWO WEEKS, PLEASE GIVE US A CALL AT [phone].   THE PATIENT IS ENCOURAGED TO PRACTICE SOCIAL DISTANCING DUE TO THE COVID-19 PANDEMIC.

## 2023-01-27 ENCOUNTER — Ambulatory Visit
Admission: RE | Admit: 2023-01-27 | Discharge: 2023-01-27 | Disposition: A | Discharge status: Home / Self Care | Payer: 59 | Source: Ambulatory Visit | Attending: Family Medicine | Admitting: Family Medicine

## 2023-01-27 ENCOUNTER — Other Ambulatory Visit: Payer: Self-pay | Admitting: Family Medicine

## 2023-01-27 DIAGNOSIS — N611 Abscess of the breast and nipple: Secondary | ICD-10-CM

## 2023-01-27 DIAGNOSIS — N644 Mastodynia: Secondary | ICD-10-CM | POA: Diagnosis not present

## 2023-02-05 ENCOUNTER — Encounter: Payer: Self-pay | Admitting: Registered Nurse

## 2023-02-05 ENCOUNTER — Encounter: Payer: 59 | Attending: Registered Nurse | Admitting: Registered Nurse

## 2023-02-05 VITALS — BP 132/84 | HR 77 | Ht 68.0 in | Wt 203.0 lb

## 2023-02-05 DIAGNOSIS — M47816 Spondylosis without myelopathy or radiculopathy, lumbar region: Secondary | ICD-10-CM

## 2023-02-05 DIAGNOSIS — Z5181 Encounter for therapeutic drug level monitoring: Secondary | ICD-10-CM | POA: Insufficient documentation

## 2023-02-05 DIAGNOSIS — Z79891 Long term (current) use of opiate analgesic: Secondary | ICD-10-CM | POA: Diagnosis not present

## 2023-02-05 DIAGNOSIS — G894 Chronic pain syndrome: Secondary | ICD-10-CM

## 2023-02-05 MED ORDER — HYDROCODONE-ACETAMINOPHEN 7.5-325 MG PO TABS: 1.0000 | ORAL_TABLET | Freq: Three times a day (TID) | ORAL | 0 refills | Status: DC | PRN

## 2023-02-05 NOTE — Progress Notes (Signed)
Subjective:    Patient ID: Brittany Crawford, female    DOB: 03-04-1956, 67 y.o.   MRN: [id number]  HPI: Brittany Crawford is a 67 y.o. female who returns for follow up appointment for chronic pain and medication refill. She states her pain is located in her lower back pain. She rates her pain 9. Her current exercise regime is walking and performing stretching exercises.  [name] Morphine equivalent is 22.50 MME.   Last UDS was Performed on 10/14/2022, it was consistent.    Pain Inventory Average Pain 7 Pain Right Now 9 My pain is intermittent, constant, sharp, burning, dull, stabbing, tingling, and aching  In the last 24 hours, has pain interfered with the following? General activity 0 Relation with others 0 Enjoyment of life 0 What TIME of day is your pain at its worst? varies Sleep (in general) Fair  Pain is worse with: bending and standing Pain improves with: rest, heat/ice, and medication Relief from Meds: 10  Family History  Problem Relation Age of Onset   Stroke Mother    Colon cancer Maternal Uncle    Breast cancer Neg Hx    Social History   Socioeconomic History   Marital status: Married    Spouse name: Not on file   Number of children: 2   Years of education: 12   Highest education level: Not on file  Occupational History   Occupation: disability  Tobacco Use   Smoking status: Former    Packs/day: 0.00    Years: 0.00    Additional pack years: 0.00    Total pack years: 0.00    Types: Cigarettes    Quit date: 03/13/2017    Years since quitting: 5.9   Smokeless tobacco: Never   Tobacco comments:    4  to 5  cigarettes daily for years  Vaping Use   Vaping Use: Never used  Substance and Sexual Activity   Alcohol use: Not Currently    Comment: occassionally   Drug use: Yes    Types: Hydrocodone   Sexual activity: Not Currently  Other Topics Concern   Not on file  Social History Narrative   Patient is married with 2 children.   Patient is right  handed.   Patient has hs education.   Patient drinks 4 cups daily.   Social Determinants of Health   Financial Resource Strain: Low Risk  (07/31/2022)   Overall Financial Resource Strain (CARDIA)    Difficulty of Paying Living Expenses: Not hard at all  Food Insecurity: No Food Insecurity (07/31/2022)   Hunger Vital Sign    Worried About Running Out of Food in the Last Year: Never true    Ran Out of Food in the Last Year: Never true  Transportation Needs: No Transportation Needs (07/31/2022)   PRAPARE - Administrator, Civil Service (Medical): No    Lack of Transportation (Non-Medical): No  Physical Activity: Insufficiently Active (07/31/2022)   Exercise Vital Sign    Days of Exercise per Week: 3 days    Minutes of Exercise per Session: 10 min  Stress: No Stress Concern Present (07/31/2022)   [employer] of Occupational Health - Occupational Stress Questionnaire    Feeling of Stress : Not at all  Social Connections: Not on file   Past Surgical History:  Procedure Laterality Date   BREAST BIOPSY Left 04/29/2016   BREAST BIOPSY Left 04/23/2016   COLONOSCOPY     DILATATION & CURRETTAGE/HYSTEROSCOPY WITH RESECTOCOPE N/A  12/10/2012   Procedure: DILATATION & CURETTAGE/HYSTEROSCOPY WITH RESECTOCOPE;  Surgeon: Serita Kyle, MD;  Location: [facility];  Service: Gynecology;  Laterality: N/A;   FOOT SURGERY     left-pins placed   LYMPH NODE BIOPSY     POLYPECTOMY N/A 12/10/2012   Procedure: POLYPECTOMY;  Surgeon: Serita Kyle, MD;  Location: [facility];  Service: Gynecology;  Laterality: N/A;   STRABISMUS SURGERY Left 04/17/2017   Procedure: REPAIR STRABISMUS LEFT EYE;  Surgeon: Verne Carrow, MD;  Location: [facility];  Service: Ophthalmology;  Laterality: Left;   Past Surgical History:  Procedure Laterality Date   BREAST BIOPSY Left 04/29/2016   BREAST BIOPSY Left 04/23/2016   COLONOSCOPY     DILATATION & CURRETTAGE/HYSTEROSCOPY WITH  RESECTOCOPE N/A 12/10/2012   Procedure: DILATATION & CURETTAGE/HYSTEROSCOPY WITH RESECTOCOPE;  Surgeon: Serita Kyle, MD;  Location: [facility];  Service: Gynecology;  Laterality: N/A;   FOOT SURGERY     left-pins placed   LYMPH NODE BIOPSY     POLYPECTOMY N/A 12/10/2012   Procedure: POLYPECTOMY;  Surgeon: Serita Kyle, MD;  Location: [facility];  Service: Gynecology;  Laterality: N/A;   STRABISMUS SURGERY Left 04/17/2017   Procedure: REPAIR STRABISMUS LEFT EYE;  Surgeon: Verne Carrow, MD;  Location: [facility];  Service: Ophthalmology;  Laterality: Left;   Past Medical History:  Diagnosis Date   Chronic back pain    H/O sarcoidosis    Hypertension    Seasonal allergies    Stroke    LMP 11/13/2012   Opioid Risk Score:   Fall Risk Score:  `1  Depression screen [facility] 2/9     01/22/2023    9:04 AM 12/11/2022    9:31 AM 11/04/2022    9:43 AM 10/14/2022   10:41 AM 08/14/2022    9:40 AM 07/31/2022   11:40 AM 07/31/2022   10:45 AM  Depression screen [facility] 2/9  Decreased Interest 0 0 0 0 0 0 0  Down, Depressed, Hopeless 0 0 0 0 0 0 0  PHQ - 2 Score 0 0 0 0 0 0 0    Review of Systems  Musculoskeletal:  Positive for back pain.       Left hip pain  All other systems reviewed and are negative.     Objective:   Physical Exam Vitals and nursing note reviewed.  Constitutional:      Appearance: Normal appearance.  Cardiovascular:     Rate and Rhythm: Normal rate and regular rhythm.     Pulses: Normal pulses.     Heart sounds: Normal heart sounds.  Pulmonary:     Effort: Pulmonary effort is normal.     Breath sounds: Normal breath sounds.  Musculoskeletal:     Cervical back: Normal range of motion and neck supple.     Comments: Normal Muscle Bulk and Muscle Testing Reveals:  Upper Extremities: Full ROM and Muscle Strength 5/5 Lumbar Paraspinal Tenderness: L-4-L-5 Lower Extremities: Full ROM and Muscle Strength 5/5 Arises from Table with Ease Narrow Based   Gait     Skin:    General: Skin is warm and dry.  Neurological:     Mental Status: She is alert and oriented to person, place, and time.  Psychiatric:        Mood and Affect: Mood normal.        Behavior: Behavior normal.         Assessment & Plan:  1. Lumbar spondylosis with DDD and facet  arthropathy.Left Lumbar Radiculitis: Continue HEP as Tolerated. Continue to Monitor.  02/05/2023 Refilled: Hydrocodone 7.5 /325 mg one tablet every 8 hours as needed for pain.  #90 tablets. . Second script e-scribe for the following month. We will continue the opioid monitoring program, this consists of regular clinic visits, examinations, urine drug screen, pill counts as well as use of [place] Controlled Substance Reporting system. A 12 month History has been reviewed on the [place] Controlled Substance Reporting System on 02/05/2023. 2. Chronic Right l knee pain:  Continue with heat/ice, exercise and Diclofenac. 02/05/2023 3. Right thalamic/internal capsule lacunar infarct with persistent left hemisensory deficits: Neurology Following. Continue to Monitor. 02/05/2023. 4.Left Greater Trochanteric Bursitis: no complaints today. Continue HEP as Tolerated and Alternate Ice and Heat Therapy.  Continue current medication regime. Continue to Monitor.  02/05/2023. 5. Polyarthralgia: Continue HEP as Tolerated. Continue to monitor. 02/05/2023 6. Cervicalgia: No complaints  today.  Continue to alternate with heat and Ice therapy. Continue to Monitor.  02/05/2023   F/U in 2 months

## 2023-02-12 ENCOUNTER — Ambulatory Visit: Payer: Medicare Other | Admitting: Registered Nurse

## 2023-02-16 ENCOUNTER — Ambulatory Visit
Admission: RE | Admit: 2023-02-16 | Discharge: 2023-02-16 | Disposition: A | Discharge status: Home / Self Care | Payer: 59 | Source: Ambulatory Visit | Attending: Family Medicine | Admitting: Family Medicine

## 2023-02-16 ENCOUNTER — Ambulatory Visit: Payer: 59

## 2023-02-16 DIAGNOSIS — N611 Abscess of the breast and nipple: Secondary | ICD-10-CM | POA: Diagnosis not present

## 2023-02-19 DIAGNOSIS — H209 Unspecified iridocyclitis: Secondary | ICD-10-CM | POA: Diagnosis not present

## 2023-03-12 ENCOUNTER — Encounter: Payer: Medicaid Other | Admitting: Nurse Practitioner

## 2023-03-20 ENCOUNTER — Telehealth: Payer: Self-pay | Admitting: *Deleted

## 2023-03-20 NOTE — Telephone Encounter (Signed)
Patient left vm about refill on Hydrocodone. Per chart pt should have a  refill with note note to fill until 03/15/23. Called pharmacy and left detailed message and asked for call back if there are any issues.

## 2023-03-23 DIAGNOSIS — H30142 Acute posterior multifocal placoid pigment epitheliopathy, left eye: Secondary | ICD-10-CM | POA: Diagnosis not present

## 2023-03-23 DIAGNOSIS — H5712 Ocular pain, left eye: Secondary | ICD-10-CM | POA: Diagnosis not present

## 2023-03-23 DIAGNOSIS — H25813 Combined forms of age-related cataract, bilateral: Secondary | ICD-10-CM | POA: Diagnosis not present

## 2023-03-23 DIAGNOSIS — H209 Unspecified iridocyclitis: Secondary | ICD-10-CM | POA: Diagnosis not present

## 2023-03-23 DIAGNOSIS — H40023 Open angle with borderline findings, high risk, bilateral: Secondary | ICD-10-CM | POA: Diagnosis not present

## 2023-03-23 DIAGNOSIS — H4422 Degenerative myopia, left eye: Secondary | ICD-10-CM | POA: Diagnosis not present

## 2023-04-10 ENCOUNTER — Encounter: Payer: Self-pay | Admitting: Registered Nurse

## 2023-04-10 ENCOUNTER — Encounter: Payer: 59 | Attending: Registered Nurse | Admitting: Registered Nurse

## 2023-04-10 VITALS — BP 130/72 | HR 75 | Ht 68.0 in | Wt 200.0 lb

## 2023-04-10 DIAGNOSIS — Z79891 Long term (current) use of opiate analgesic: Secondary | ICD-10-CM | POA: Insufficient documentation

## 2023-04-10 DIAGNOSIS — Z5181 Encounter for therapeutic drug level monitoring: Secondary | ICD-10-CM | POA: Diagnosis not present

## 2023-04-10 DIAGNOSIS — G894 Chronic pain syndrome: Secondary | ICD-10-CM | POA: Insufficient documentation

## 2023-04-10 DIAGNOSIS — M47816 Spondylosis without myelopathy or radiculopathy, lumbar region: Secondary | ICD-10-CM | POA: Diagnosis not present

## 2023-04-10 DIAGNOSIS — M7062 Trochanteric bursitis, left hip: Secondary | ICD-10-CM | POA: Insufficient documentation

## 2023-04-10 MED ORDER — HYDROCODONE-ACETAMINOPHEN 7.5-325 MG PO TABS: 1.0000 | ORAL_TABLET | Freq: Three times a day (TID) | ORAL | 0 refills | Status: DC | PRN

## 2023-04-10 NOTE — Progress Notes (Signed)
Subjective:    Patient ID: Brittany Crawford, female    DOB: 1956/03/14, 68 y.o.   MRN: [id number]  HPI: Brittany Crawford is a 67 y.o. female who returns for follow up appointment for chronic pain and medication refill. She states her pain is located in her lower back and left hip pain. She rates her pain 5. Her current exercise regime is walking and performing chair exercises.  Brittany Crawford Morphine equivalent is 22.50 MME.   UDS ordered today.    Pain Inventory Average Pain 9 Pain Right Now 5 My pain is intermittent, sharp, and dull  In the last 24 hours, has pain interfered with the following? General activity 9 Relation with others 10 Enjoyment of life 10 What TIME of day is your pain at its worst? evening Sleep (in general) Fair  Pain is worse with: bending and standing Pain improves with: rest, heat/ice, therapy/exercise, pacing activities, and medication Relief from Meds: 9  Family History  Problem Relation Age of Onset   Stroke Mother    Colon cancer Maternal Uncle    Breast cancer Neg Hx    Social History   Socioeconomic History   Marital status: Married    Spouse name: Not on file   Number of children: 2   Years of education: 12   Highest education level: Not on file  Occupational History   Occupation: disability  Tobacco Use   Smoking status: Former    Packs/day: 0.00    Years: 0.00    Additional pack years: 0.00    Total pack years: 0.00    Types: Cigarettes    Quit date: 03/13/2017    Years since quitting: 6.0   Smokeless tobacco: Never   Tobacco comments:    4  to 5  cigarettes daily for years  Vaping Use   Vaping Use: Never used  Substance and Sexual Activity   Alcohol use: Not Currently    Comment: occassionally   Drug use: Yes    Types: Hydrocodone   Sexual activity: Not Currently  Other Topics Concern   Not on file  Social History Narrative   Patient is married with 2 children.   Patient is right handed.   Patient has hs education.    Patient drinks 4 cups daily.   Social Determinants of Health   Financial Resource Strain: Low Risk  (07/31/2022)   Overall Financial Resource Strain (CARDIA)    Difficulty of Paying Living Expenses: Not hard at all  Food Insecurity: No Food Insecurity (07/31/2022)   Hunger Vital Sign    Worried About Running Out of Food in the Last Year: Never true    Ran Out of Food in the Last Year: Never true  Transportation Needs: No Transportation Needs (07/31/2022)   PRAPARE - Administrator, Civil Service (Medical): No    Lack of Transportation (Non-Medical): No  Physical Activity: Insufficiently Active (07/31/2022)   Exercise Vital Sign    Days of Exercise per Week: 3 days    Minutes of Exercise per Session: 10 min  Stress: No Stress Concern Present (07/31/2022)   [employer] of Occupational Health - Occupational Stress Questionnaire    Feeling of Stress : Not at all  Social Connections: Not on file   Past Surgical History:  Procedure Laterality Date   BREAST BIOPSY Left 04/29/2016   BREAST BIOPSY Left 04/23/2016   COLONOSCOPY     DILATATION & CURRETTAGE/HYSTEROSCOPY WITH RESECTOCOPE N/A 12/10/2012   Procedure: DILATATION &  CURETTAGE/HYSTEROSCOPY WITH RESECTOCOPE;  Surgeon: Serita Kyle, MD;  Location: [facility];  Service: Gynecology;  Laterality: N/A;   FOOT SURGERY     left-pins placed   LYMPH NODE BIOPSY     POLYPECTOMY N/A 12/10/2012   Procedure: POLYPECTOMY;  Surgeon: Serita Kyle, MD;  Location: [facility];  Service: Gynecology;  Laterality: N/A;   STRABISMUS SURGERY Left 04/17/2017   Procedure: REPAIR STRABISMUS LEFT EYE;  Surgeon: Verne Carrow, MD;  Location: [facility];  Service: Ophthalmology;  Laterality: Left;   Past Surgical History:  Procedure Laterality Date   BREAST BIOPSY Left 04/29/2016   BREAST BIOPSY Left 04/23/2016   COLONOSCOPY     DILATATION & CURRETTAGE/HYSTEROSCOPY WITH RESECTOCOPE N/A 12/10/2012   Procedure:  DILATATION & CURETTAGE/HYSTEROSCOPY WITH RESECTOCOPE;  Surgeon: Serita Kyle, MD;  Location: [facility];  Service: Gynecology;  Laterality: N/A;   FOOT SURGERY     left-pins placed   LYMPH NODE BIOPSY     POLYPECTOMY N/A 12/10/2012   Procedure: POLYPECTOMY;  Surgeon: Serita Kyle, MD;  Location: [facility];  Service: Gynecology;  Laterality: N/A;   STRABISMUS SURGERY Left 04/17/2017   Procedure: REPAIR STRABISMUS LEFT EYE;  Surgeon: Verne Carrow, MD;  Location: [facility];  Service: Ophthalmology;  Laterality: Left;   Past Medical History:  Diagnosis Date   Chronic back pain    H/O sarcoidosis    Hypertension    Seasonal allergies    Stroke (HCC)    BP 130/72   Pulse 75   Ht 5\' 8"  (1.727 m)   Wt 200 lb (90.7 kg)   LMP 11/13/2012   SpO2 95%   BMI 30.41 kg/m   Opioid Risk Score:   Fall Risk Score:  `1  Depression screen [facility] 2/9     02/05/2023    9:41 AM 01/22/2023    9:04 AM 12/11/2022    9:31 AM 11/04/2022    9:43 AM 10/14/2022   10:41 AM 08/14/2022    9:40 AM 07/31/2022   11:40 AM  Depression screen PHQ 2/9  Decreased Interest 0 0 0 0 0 0 0  Down, Depressed, Hopeless 0 0 0 0 0 0 0  PHQ - 2 Score 0 0 0 0 0 0 0     Review of Systems  Musculoskeletal:  Positive for back pain.       Left buttocks pain  All other systems reviewed and are negative.      Objective:   Physical Exam Vitals and nursing note reviewed.  Constitutional:      Appearance: Normal appearance.  Cardiovascular:     Rate and Rhythm: Normal rate and regular rhythm.     Pulses: Normal pulses.     Heart sounds: Normal heart sounds.  Pulmonary:     Effort: Pulmonary effort is normal.     Breath sounds: Normal breath sounds.  Musculoskeletal:     Cervical back: Normal range of motion and neck supple.     Comments: Normal Muscle Bulk and Muscle Testing Reveals: Upper Extremities: Full ROM and Muscle Strength 5/5 Lumbar Paraspinal Tenderness: L-3-L-5 Left Greater  Trochanter Tenderness Lower Extremities: Full ROM and Muscle Strength 5/5 Arises from Table with ease Narrow Based  Gait     Skin:    General: Skin is warm and dry.  Neurological:     Mental Status: She is alert and oriented to person, place, and time.  Psychiatric:        Mood and  Affect: Mood normal.        Behavior: Behavior normal.         Assessment & Plan:  1. Lumbar spondylosis with DDD and facet arthropathy.Left Lumbar Radiculitis: Continue HEP as Tolerated. Continue to Monitor.  04/10/2023 Refilled: Hydrocodone 7.5 /325 mg one tablet every 8 hours as needed for pain.  #90 tablets. . Second script e-scribe for the following month. We will continue the opioid monitoring program, this consists of regular clinic visits, examinations, urine drug screen, pill counts as well as use of [place] Controlled Substance Reporting system. A 12 month History has been reviewed on the [place] Controlled Substance Reporting System on 04/10/2023. 2. Chronic Right l knee pain:  Continue with heat/ice, exercise and Diclofenac. 04/10/2023 3. Right thalamic/internal capsule lacunar infarct with persistent left hemisensory deficits: Neurology Following. Continue to Monitor. 04/10/2023. 4.Left Greater Trochanteric Bursitis: no complaints today. Continue HEP as Tolerated and Alternate Ice and Heat Therapy.  Continue current medication regime. Continue to Monitor.  04/10/2023. 5. Polyarthralgia: Continue HEP as Tolerated. Continue to monitor. 04/10/2023 6. Cervicalgia: No complaints  today.  Continue to alternate with heat and Ice therapy. Continue to Monitor.  04/10/2023   F/U in 2 months

## 2023-04-12 ENCOUNTER — Other Ambulatory Visit: Payer: Self-pay | Admitting: Nurse Practitioner

## 2023-04-15 LAB — TOXASSURE SELECT,+ANTIDEPR,UR

## 2023-04-30 ENCOUNTER — Ambulatory Visit (INDEPENDENT_AMBULATORY_CARE_PROVIDER_SITE_OTHER): Payer: 59 | Admitting: Nurse Practitioner

## 2023-04-30 ENCOUNTER — Encounter: Payer: Self-pay | Admitting: Nurse Practitioner

## 2023-04-30 VITALS — BP 120/76 | HR 85 | Temp 98.9°F | Ht 68.0 in | Wt 200.2 lb

## 2023-04-30 DIAGNOSIS — I129 Hypertensive chronic kidney disease with stage 1 through stage 4 chronic kidney disease, or unspecified chronic kidney disease: Secondary | ICD-10-CM | POA: Diagnosis not present

## 2023-04-30 DIAGNOSIS — E782 Mixed hyperlipidemia: Secondary | ICD-10-CM

## 2023-04-30 DIAGNOSIS — Z79899 Other long term (current) drug therapy: Secondary | ICD-10-CM | POA: Diagnosis not present

## 2023-04-30 DIAGNOSIS — I10 Essential (primary) hypertension: Secondary | ICD-10-CM | POA: Insufficient documentation

## 2023-04-30 DIAGNOSIS — N182 Chronic kidney disease, stage 2 (mild): Secondary | ICD-10-CM

## 2023-04-30 DIAGNOSIS — Z0000 Encounter for general adult medical examination without abnormal findings: Secondary | ICD-10-CM

## 2023-04-30 DIAGNOSIS — E1122 Type 2 diabetes mellitus with diabetic chronic kidney disease: Secondary | ICD-10-CM | POA: Insufficient documentation

## 2023-04-30 LAB — POCT URINALYSIS DIP (CLINITEK)
Bilirubin, UA: NEGATIVE
Glucose, UA: 500 mg/dL — AB
Ketones, POC UA: NEGATIVE mg/dL
Leukocytes, UA: NEGATIVE
Nitrite, UA: NEGATIVE
POC PROTEIN,UA: NEGATIVE
Spec Grav, UA: 1.025 — AB (ref 1.010–1.025)
Urobilinogen, UA: 0.2 E.U./dL
pH, UA: 5.5 (ref 5.0–8.0)

## 2023-04-30 MED ORDER — PRAVASTATIN SODIUM 20 MG PO TABS: 20.0000 mg | ORAL_TABLET | Freq: Every evening | ORAL | 1 refills | Status: DC

## 2023-04-30 NOTE — Progress Notes (Addendum)
Madelaine Bhat, CMA,acting as a Neurosurgeon for Arnette Felts, FNP.,have documented all relevant documentation on the behalf of Arnette Felts, FNP,as directed by  Arnette Felts, FNP while in the presence of Arnette Felts, FNP.  Subjective:    Patient ID: Brittany Crawford , female    DOB: [birth year] , 67 y.o.   MRN: [id number]  Chief Complaint  Patient presents with   Annual Exam    HPI  Patient presents today for HM. Patient reports compliance with medications. Patient denies any chest pain, SOB, or headaches. Patient has no other concerns today. She has vision loss to her right eye and does not drive.       Past Medical History:  Diagnosis Date   Chronic back pain    H/O sarcoidosis    Hypertension    Seasonal allergies    Stroke [facility])      Family History  Problem Relation Age of Onset   Stroke Mother    Colon cancer Maternal Uncle    Breast cancer Neg Hx      Current Outpatient Medications:    Ascorbic Acid (VITAMIN C) 1000 MG tablet, Take 1,000 mg by mouth daily. Takes when coming down with a cold., Disp: , Rfl:    aspirin EC 81 MG tablet, Take 1 tablet (81 mg total) by mouth daily. Swallow whole., Disp: 90 tablet, Rfl: 3   azelastine (OPTIVAR) 0.05 % ophthalmic solution, SMARTSIG:1 Drop(s) In Eye(s) Twice Daily PRN, Disp: , Rfl:    blood glucose meter kit and supplies KIT, Dispense based on patient and insurance preference. Use up to four times daily as directed. (Patient not taking: Reported on 05/25/2023), Disp: 1 each, Rfl: 0   dapagliflozin propanediol (FARXIGA) 10 MG TABS tablet, TAKE 1 TABLET(10 MG) BY MOUTH DAILY BEFORE BREAKFAST, Disp: 90 tablet, Rfl: 2   diclofenac sodium (VOLTAREN) 1 % GEL, Apply 2 g topically 4 (four) times daily., Disp: 300 g, Rfl: 2   latanoprost (XALATAN) 0.005 % ophthalmic solution, 1 drop at bedtime., Disp: , Rfl:    losartan-hydrochlorothiazide (HYZAAR) 100-12.5 MG tablet, TAKE 1 TABLET BY MOUTH DAILY, Disp: 90 tablet, Rfl: 1   Multiple Vitamin  (MULTIVITAMIN WITH MINERALS) TABS tablet, Take 1 tablet by mouth daily., Disp: , Rfl:    Olopatadine HCl 0.2 % SOLN, Apply 1 drop to eye daily as needed for allergies. (Patient not taking: Reported on 05/25/2023), Disp: , Rfl: 3   triamcinolone cream (KENALOG) 0.1 %, Apply 1 Application topically at bedtime as needed (for rash)., Disp: 30 g, Rfl: 1   HYDROcodone-acetaminophen (NORCO) 7.5-325 MG tablet, Take 1 tablet by mouth every 8 (eight) hours as needed., Disp: , Rfl:    pravastatin (PRAVACHOL) 20 MG tablet, Take 1 tablet (20 mg total) by mouth every evening., Disp: 90 tablet, Rfl: 1   prednisoLONE acetate (PRED FORTE) 1 % ophthalmic suspension, Place 1 drop into the left eye 4 (four) times daily., Disp: , Rfl:    Allergies  Allergen Reactions   Gadobenate Nausea And Vomiting    Pt was fine after a few minutes , vomiting after contrast injection smills    Ivp Dye [Iodinated Contrast Media]       The patient states she is post menopausal status.  Patient's last menstrual period was 11/13/2012..no abnormal vaginal bleeding.  Negative for: breast discharge, breast lump(s), breast pain and breast self exam. Associated symptoms include abnormal vaginal bleeding. Pertinent negatives include abnormal bleeding (hematology), anxiety, decreased libido, depression, difficulty falling sleep, dyspareunia,  history of infertility, nocturia, sexual dysfunction, sleep disturbances, urinary incontinence, urinary urgency, vaginal discharge and vaginal itching. Diet regular; states " I am getting better"eating more fruits and vegetables, she is watching the sugar and candy. The patient states her exercise level is minimal with 2 days a week. She does not drive.   The patient's tobacco use is:  Social History   Tobacco Use  Smoking Status Former   Current packs/day: 0.00   Types: Cigarettes   Quit date: 03/13/2017   Years since quitting: 6.2   Passive exposure: Never  Smokeless Tobacco Never  Tobacco Comments    4  to 5  cigarettes daily for years   She has been exposed to passive smoke. The patient's alcohol use is:  Social History   Substance and Sexual Activity  Alcohol Use Not Currently   Comment: occassionally  . Additional information: Last pap 03/04/22, next one scheduled for 03/04/2025.    Review of Systems  Constitutional: Negative.   HENT: Negative.    Eyes: Negative.   Respiratory: Negative.    Cardiovascular: Negative.   Gastrointestinal: Negative.   Endocrine: Negative.   Genitourinary: Negative.   Musculoskeletal: Negative.   Skin: Negative.   Allergic/Immunologic: Negative.   Neurological: Negative.   Hematological: Negative.   Psychiatric/Behavioral: Negative.       Today's Vitals   04/30/23 1415  BP: 120/76  Pulse: 85  Temp: 98.9 F (37.2 C)  Weight: 200 lb 3.2 oz (90.8 kg)  Height: 5\' 8"  (1.727 m)  PainSc: 0-No pain   Body mass index is 30.44 kg/m.  Wt Readings from Last 3 Encounters:  05/25/23 202 lb (91.6 kg)  04/30/23 200 lb 3.2 oz (90.8 kg)  04/10/23 200 lb (90.7 kg)     Objective:  Physical Exam Vitals reviewed. Exam conducted with a chaperone present.  Constitutional:      General: She is not in acute distress.    Appearance: Normal appearance. She is well-developed. She is obese.  HENT:     Head: Normocephalic and atraumatic.     Right Ear: Hearing, tympanic membrane, ear canal and external ear normal. There is no impacted cerumen.     Left Ear: Hearing, tympanic membrane, ear canal and external ear normal. There is no impacted cerumen.     Nose: Nose normal.     Mouth/Throat:     Mouth: Mucous membranes are moist.  Eyes:     General: Lids are normal.     Extraocular Movements: Extraocular movements intact.     Conjunctiva/sclera: Conjunctivae normal.     Pupils: Pupils are equal, round, and reactive to light.     Funduscopic exam:    Right eye: No papilledema.        Left eye: No papilledema.  Neck:     Thyroid: No thyroid mass.      Vascular: No carotid bruit.  Cardiovascular:     Rate and Rhythm: Normal rate and regular rhythm.     Pulses: Normal pulses.     Heart sounds: Normal heart sounds. No murmur heard. Pulmonary:     Effort: Pulmonary effort is normal. No respiratory distress.     Breath sounds: Normal breath sounds. No wheezing.  Abdominal:     General: Bowel sounds are normal. There is no distension.     Palpations: Abdomen is soft.     Tenderness: There is no abdominal tenderness.     Comments: round  Genitourinary:    Tanner stage (genital): 5.  Labia:        Right: No rash or tenderness.        Left: No rash or tenderness.      Urethra: No prolapse.     Vagina: Normal.     Cervix: Normal.     Uterus: Normal.      Adnexa: Right adnexa normal and left adnexa normal.  Musculoskeletal:        General: No swelling or tenderness. Normal range of motion.     Cervical back: Full passive range of motion without pain, normal range of motion and neck supple.  Skin:    General: Skin is warm and dry.     Capillary Refill: Capillary refill takes less than 2 seconds.     Coloration: Skin is not jaundiced.     Findings: No bruising.  Neurological:     Mental Status: She is alert and oriented to person, place, and time.     Cranial Nerves: No cranial nerve deficit.     Sensory: No sensory deficit.     Motor: No weakness.  Psychiatric:        Behavior: Behavior normal.        Thought Content: Thought content normal.        Judgment: Judgment normal.         Assessment And Plan:     Hypertensive nephropathy Assessment & Plan: B/P is well controlled.  CMP ordered to check renal function.  The importance of regular exercise and dietary modification was stressed to the patient.  Stressed importance of losing ten percent of her body weight to help with B/P control.  The weight loss would help with decreasing cardiac and cancer risk as well.  EKG done with NSR HR 76   Orders: -     POCT  URINALYSIS DIP (CLINITEK) -     Microalbumin / creatinine urine ratio -     EKG 12-Lead -     CMP14+EGFR  Encounter for annual health examination Assessment & Plan: Behavior modifications discussed and diet history reviewed.   Pt will continue to exercise regularly and modify diet with low GI, plant based foods and decrease intake of processed foods.  Recommend intake of daily multivitamin, Vitamin D, and calcium.  Recommend mammogram and colonoscopy for preventive screenings, as well as recommend immunizations that include influenza, TDAP, and Shingles (up to date)    Type 2 diabetes mellitus with stage 2 chronic kidney disease, without long-term current use of insulin (HCC) Assessment & Plan: Hemoglobin A1c is stable.  Continue current medications.Chronic, controlled Continue with current medications Encouraged to limit intake of sugary foods and drinks Encouraged to increase physical activity to 150 minutes per week Diabetic foot exam done, no abnormal findings Eye exam is not up to date Suggestion is made for her to avoid simple carbohydrates from her diet including Cakes, Sweet Desserts, Ice Cream, Soda (diet and regular), Sweet Tea, Candies, Chips, Cookies, Store Bought Juices, Alcohol in Excess of 1-2 drinks a day, Artificial Sweeteners, Coffee Creamer, and "Sugar-free" Products. This will help patient to have more stable blood glucose    Orders: -     Hemoglobin A1c -     Pravastatin Sodium; Take 1 tablet (20 mg total) by mouth every evening.  Dispense: 90 tablet; Refill: 1  Mixed hyperlipidemia Assessment & Plan: Cholesterol levels are stable.  Continue statin, tolerating well.  Orders: -     Lipid panel -     Pravastatin Sodium; Take 1 tablet (  20 mg total) by mouth every evening.  Dispense: 90 tablet; Refill: 1  Other long term (current) drug therapy -     CBC with Differential/Platelet     Return for 1 year physical. Patient was given opportunity to ask  questions. Patient verbalized understanding of the plan and was able to repeat key elements of the plan. All questions were answered to their satisfaction.   Arnette Felts, FNP  I, Arnette Felts, FNP, have reviewed all documentation for this visit. The documentation on 04/30/23 for the exam, diagnosis, procedures, and orders are all accurate and complete.

## 2023-04-30 NOTE — Patient Instructions (Signed)
Health Maintenance  Topic Date Due   Eye exam for diabetics  Never done   Yearly kidney health urinalysis for diabetes  03/05/2023   COVID-19 Vaccine (3 - Moderna risk series) 07/14/2023*   Zoster (Shingles) Vaccine (2 of 2) 07/31/2023*   Hemoglobin A1C  05/05/2023   Flu Shot  05/14/2023   Medicare Annual Wellness Visit  08/01/2023   Yearly kidney function blood test for diabetes  11/05/2023   Complete foot exam   04/29/2024   Mammogram  10/23/2024   Pap Smear  03/04/2025   DTaP/Tdap/Td vaccine (2 - Td or Tdap) 03/04/2032   Colon Cancer Screening  10/17/2032   Hepatitis C Screening  Completed   HIV Screening  Completed   HPV Vaccine  Aged Out  *Topic was postponed. The date shown is not the original due date.

## 2023-05-01 LAB — LIPID PANEL
Chol/HDL Ratio: 3.3 ratio (ref 0.0–4.4)
Cholesterol, Total: 172 mg/dL (ref 100–199)
HDL: 52 mg/dL (ref >39)
LDL Chol Calc (NIH): 87 mg/dL (ref 0–99)
Triglycerides: 197 mg/dL — ABNORMAL HIGH (ref 0–149)
VLDL Cholesterol Cal: 33 mg/dL (ref 5–40)

## 2023-05-01 LAB — CBC WITH DIFFERENTIAL/PLATELET
Basophils Absolute: 0 10*3/uL (ref 0.0–0.2)
Basos: 0 %
EOS (ABSOLUTE): 0.2 10*3/uL (ref 0.0–0.4)
Eos: 3 %
Hematocrit: 40.5 % (ref 34.0–46.6)
Hemoglobin: 13.4 g/dL (ref 11.1–15.9)
Immature Grans (Abs): 0 10*3/uL (ref 0.0–0.1)
Immature Granulocytes: 0 %
Lymphocytes Absolute: 2.9 10*3/uL (ref 0.7–3.1)
Lymphs: 48 %
MCH: 28.6 pg (ref 26.6–33.0)
MCHC: 33.1 g/dL (ref 31.5–35.7)
MCV: 86 fL (ref 79–97)
Monocytes Absolute: 0.6 10*3/uL (ref 0.1–0.9)
Monocytes: 9 %
Neutrophils Absolute: 2.4 10*3/uL (ref 1.4–7.0)
Neutrophils: 40 %
Platelets: 285 10*3/uL (ref 150–450)
RBC: 4.69 x10E6/uL (ref 3.77–5.28)
RDW: 14.1 % (ref 11.7–15.4)
WBC: 6.1 10*3/uL (ref 3.4–10.8)

## 2023-05-01 LAB — CMP14+EGFR
ALT: 13 IU/L (ref 0–32)
AST: 13 IU/L (ref 0–40)
Albumin: 4.6 g/dL (ref 3.9–4.9)
Alkaline Phosphatase: 64 IU/L (ref 44–121)
BUN/Creatinine Ratio: 15 (ref 12–28)
BUN: 16 mg/dL (ref 8–27)
Bilirubin Total: 0.6 mg/dL (ref 0.0–1.2)
CO2: 24 mmol/L (ref 20–29)
Calcium: 10.2 mg/dL (ref 8.7–10.3)
Chloride: 101 mmol/L (ref 96–106)
Creatinine, Ser: 1.09 mg/dL — ABNORMAL HIGH (ref 0.57–1.00)
Globulin, Total: 2.8 g/dL (ref 1.5–4.5)
Glucose: 92 mg/dL (ref 70–99)
Potassium: 3.6 mmol/L (ref 3.5–5.2)
Sodium: 141 mmol/L (ref 134–144)
Total Protein: 7.4 g/dL (ref 6.0–8.5)
eGFR: 57 mL/min/{1.73_m2} — ABNORMAL LOW (ref >59)

## 2023-05-01 LAB — HEMOGLOBIN A1C
Est. average glucose Bld gHb Est-mCnc: 128 mg/dL
Hgb A1c MFr Bld: 6.1 % — ABNORMAL HIGH (ref 4.8–5.6)

## 2023-05-01 LAB — MICROALBUMIN / CREATININE URINE RATIO
Creatinine, Urine: 135.8 mg/dL
Microalb/Creat Ratio: 6 mg/g creat (ref 0–29)
Microalbumin, Urine: 8.3 ug/mL

## 2023-05-12 NOTE — Assessment & Plan Note (Signed)
Cholesterol levels are stable.  Continue statin, tolerating well.

## 2023-05-12 NOTE — Assessment & Plan Note (Signed)
B/P is well controlled.  CMP ordered to check renal function.  The importance of regular exercise and dietary modification was stressed to the patient.  Stressed importance of losing ten percent of her body weight to help with B/P control.  The weight loss would help with decreasing cardiac and cancer risk as well.  EKG done with NSR HR 76

## 2023-05-12 NOTE — Assessment & Plan Note (Signed)

## 2023-05-12 NOTE — Assessment & Plan Note (Signed)
Hemoglobin A1c is stable.  Continue current medications.Chronic, controlled Continue with current medications Encouraged to limit intake of sugary foods and drinks Encouraged to increase physical activity to 150 minutes per week Diabetic foot exam done, no abnormal findings Eye exam is not up to date Suggestion is made for her to avoid simple carbohydrates from her diet including Cakes, Sweet Desserts, Ice Cream, Soda (diet and regular), Sweet Tea, Candies, Chips, Cookies, Store Bought Juices, Alcohol in Excess of 1-2 drinks a day, Artificial Sweeteners, Coffee Creamer, and "Sugar-free" Products. This will help patient to have more stable blood glucose

## 2023-05-12 NOTE — Assessment & Plan Note (Signed)
>>  ASSESSMENT AND PLAN FOR ESSENTIAL HYPERTENSION WRITTEN ON 05/12/2023 11:13 PM BY Arnette Felts, FNP  B/P is well controlled.  CMP ordered to check renal function.  The importance of regular exercise and dietary modification was stressed to the patient.  Stressed importance of losing ten percent of her body weight to help with B/P control.  The weight loss would help with decreasing cardiac and cancer risk as well.  EKG done with NSR HR 76

## 2023-05-25 ENCOUNTER — Encounter: Payer: Self-pay | Admitting: Internal Medicine

## 2023-05-25 ENCOUNTER — Ambulatory Visit: Payer: 59 | Attending: Internal Medicine | Admitting: Internal Medicine

## 2023-05-25 VITALS — BP 129/81 | HR 72 | Resp 14 | Ht 67.5 in | Wt 202.0 lb

## 2023-05-25 DIAGNOSIS — D869 Sarcoidosis, unspecified: Secondary | ICD-10-CM | POA: Diagnosis not present

## 2023-05-25 DIAGNOSIS — E559 Vitamin D deficiency, unspecified: Secondary | ICD-10-CM

## 2023-05-25 LAB — SEDIMENTATION RATE: Sed Rate: 25 mm/h (ref 0–30)
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
## 2023-05-28 DIAGNOSIS — H40023 Open angle with borderline findings, high risk, bilateral: Secondary | ICD-10-CM | POA: Diagnosis not present

## 2023-05-28 DIAGNOSIS — H209 Unspecified iridocyclitis: Secondary | ICD-10-CM | POA: Diagnosis not present

## 2023-05-29 NOTE — Progress Notes (Signed)
Lab results are all normal I don't see any evidence of active systemic sarcoidosis at this time. I think she is okay to just follow up with her ophthalmologist for treatment at this time.

## 2023-06-08 NOTE — Addendum Note (Signed)
Addended by: Arnette Felts F on: 06/08/2023 02:01 PM   Modules accepted: Level of Service

## 2023-06-10 ENCOUNTER — Encounter: Payer: 59 | Attending: Registered Nurse | Admitting: Registered Nurse

## 2023-06-10 ENCOUNTER — Other Ambulatory Visit: Payer: Self-pay | Admitting: Nurse Practitioner

## 2023-06-10 ENCOUNTER — Encounter: Payer: Self-pay | Admitting: Registered Nurse

## 2023-06-10 VITALS — BP 123/81 | HR 88 | Ht 67.5 in | Wt 198.8 lb

## 2023-06-10 DIAGNOSIS — K5909 Other constipation: Secondary | ICD-10-CM | POA: Insufficient documentation

## 2023-06-10 DIAGNOSIS — M47816 Spondylosis without myelopathy or radiculopathy, lumbar region: Secondary | ICD-10-CM | POA: Diagnosis not present

## 2023-06-10 DIAGNOSIS — G8929 Other chronic pain: Secondary | ICD-10-CM | POA: Insufficient documentation

## 2023-06-10 DIAGNOSIS — G894 Chronic pain syndrome: Secondary | ICD-10-CM | POA: Diagnosis not present

## 2023-06-10 DIAGNOSIS — Z79891 Long term (current) use of opiate analgesic: Secondary | ICD-10-CM | POA: Insufficient documentation

## 2023-06-10 DIAGNOSIS — M25552 Pain in left hip: Secondary | ICD-10-CM | POA: Diagnosis not present

## 2023-06-10 DIAGNOSIS — N1831 Chronic kidney disease, stage 3a: Secondary | ICD-10-CM

## 2023-06-10 DIAGNOSIS — M7062 Trochanteric bursitis, left hip: Secondary | ICD-10-CM | POA: Diagnosis not present

## 2023-06-10 DIAGNOSIS — Z5181 Encounter for therapeutic drug level monitoring: Secondary | ICD-10-CM | POA: Diagnosis not present

## 2023-06-10 MED ORDER — HYDROCODONE-ACETAMINOPHEN 7.5-325 MG PO TABS: 1.0000 | ORAL_TABLET | Freq: Three times a day (TID) | ORAL | 0 refills | Status: DC | PRN

## 2023-06-10 MED ORDER — BISACODYL 5 MG PO TBEC: 5.0000 mg | DELAYED_RELEASE_TABLET | Freq: Every day | ORAL | 1 refills | Status: AC | PRN

## 2023-06-10 NOTE — Progress Notes (Signed)
Subjective:    Patient ID: Brittany Crawford, female    DOB: 03/14/1956, 67 y.o.   MRN: [id number]  HPI: Brittany Crawford is a 67 y.o. female who returns for follow up appointment for chronic pain and medication refill. She states her pain is located in her lower back and left hip pain. She rates her pain 8. Her current exercise regime is walking and performing stretching exercises.  Ms. [place] Morphine equivalent is 22.50 MME.   Last UDS was Performed on 04/10/2023, it was consistent.    Pain Inventory Average Pain 8 Pain Right Now 8 My pain is aching  In the last 24 hours, has pain interfered with the following? General activity 8 Relation with others 10 Enjoyment of life 9 What TIME of day is your pain at its worst? varies Sleep (in general) Good  Pain is worse with: bending and standing Pain improves with: rest, heat/ice, and medication Relief from Meds: 9  Family History  Problem Relation Age of Onset   Stroke Mother    Colon cancer Maternal Uncle    Breast cancer Neg Hx    Social History   Socioeconomic History   Marital status: Married    Spouse name: Not on file   Number of children: 2   Years of education: 12   Highest education level: Not on file  Occupational History   Occupation: disability  Tobacco Use   Smoking status: Former    Current packs/day: 0.00    Types: Cigarettes    Quit date: 03/13/2017    Years since quitting: 6.2    Passive exposure: Never   Smokeless tobacco: Never   Tobacco comments:    4  to 5  cigarettes daily for years  Vaping Use   Vaping status: Never Used  Substance and Sexual Activity   Alcohol use: Not Currently    Comment: occassionally   Drug use: Yes    Types: Hydrocodone   Sexual activity: Not Currently  Other Topics Concern   Not on file  Social History Narrative   Patient is married with 2 children.   Patient is right handed.   Patient has hs education.   Patient drinks 4 cups daily.   Social Determinants  of Health   Financial Resource Strain: Low Risk  (07/31/2022)   Overall Financial Resource Strain (CARDIA)    Difficulty of Paying Living Expenses: Not hard at all  Food Insecurity: No Food Insecurity (07/31/2022)   Hunger Vital Sign    Worried About Running Out of Food in the Last Year: Never true    Ran Out of Food in the Last Year: Never true  Transportation Needs: No Transportation Needs (07/31/2022)   PRAPARE - Administrator, Civil Service (Medical): No    Lack of Transportation (Non-Medical): No  Physical Activity: Insufficiently Active (07/31/2022)   Exercise Vital Sign    Days of Exercise per Week: 3 days    Minutes of Exercise per Session: 10 min  Stress: No Stress Concern Present (07/31/2022)   [employer] of Occupational Health - Occupational Stress Questionnaire    Feeling of Stress : Not at all  Social Connections: Not on file   Past Surgical History:  Procedure Laterality Date   BREAST BIOPSY Left 04/29/2016   BREAST BIOPSY Left 04/23/2016   COLONOSCOPY     DILATATION & CURRETTAGE/HYSTEROSCOPY WITH RESECTOCOPE N/A 12/10/2012   Procedure: DILATATION & CURETTAGE/HYSTEROSCOPY WITH RESECTOCOPE;  Surgeon: Serita Kyle, MD;  Location:  [facility];  Service: Gynecology;  Laterality: N/A;   FOOT SURGERY     left-pins placed   LYMPH NODE BIOPSY     POLYPECTOMY N/A 12/10/2012   Procedure: POLYPECTOMY;  Surgeon: Serita Kyle, MD;  Location: [facility];  Service: Gynecology;  Laterality: N/A;   STRABISMUS SURGERY Left 04/17/2017   Procedure: REPAIR STRABISMUS LEFT EYE;  Surgeon: Verne Carrow, MD;  Location: [facility];  Service: Ophthalmology;  Laterality: Left;   Past Surgical History:  Procedure Laterality Date   BREAST BIOPSY Left 04/29/2016   BREAST BIOPSY Left 04/23/2016   COLONOSCOPY     DILATATION & CURRETTAGE/HYSTEROSCOPY WITH RESECTOCOPE N/A 12/10/2012   Procedure: DILATATION & CURETTAGE/HYSTEROSCOPY WITH RESECTOCOPE;   Surgeon: Serita Kyle, MD;  Location: [facility];  Service: Gynecology;  Laterality: N/A;   FOOT SURGERY     left-pins placed   LYMPH NODE BIOPSY     POLYPECTOMY N/A 12/10/2012   Procedure: POLYPECTOMY;  Surgeon: Serita Kyle, MD;  Location: [facility];  Service: Gynecology;  Laterality: N/A;   STRABISMUS SURGERY Left 04/17/2017   Procedure: REPAIR STRABISMUS LEFT EYE;  Surgeon: Verne Carrow, MD;  Location: [facility];  Service: Ophthalmology;  Laterality: Left;   Past Medical History:  Diagnosis Date   Chronic back pain    H/O sarcoidosis    Hypertension    Seasonal allergies    Stroke (HCC)    BP 123/81   Pulse 88   Ht 5' 7.5" (1.715 m)   Wt 198 lb 12.8 oz (90.2 kg)   LMP 11/13/2012   SpO2 96%   BMI 30.68 kg/m   Opioid Risk Score:   Fall Risk Score:  `1  Depression screen [facility] 2/9     02/05/2023    9:41 AM 01/22/2023    9:04 AM 12/11/2022    9:31 AM 11/04/2022    9:43 AM 10/14/2022   10:41 AM 08/14/2022    9:40 AM 07/31/2022   11:40 AM  Depression screen PHQ 2/9  Decreased Interest 0 0 0 0 0 0 0  Down, Depressed, Hopeless 0 0 0 0 0 0 0  PHQ - 2 Score 0 0 0 0 0 0 0      Review of Systems  Gastrointestinal:  Positive for constipation.  Musculoskeletal:  Positive for back pain.       Pelvic pain  All other systems reviewed and are negative.     Objective:   Physical Exam Vitals and nursing note reviewed.  Constitutional:      Appearance: Normal appearance.  Cardiovascular:     Rate and Rhythm: Normal rate and regular rhythm.     Pulses: Normal pulses.     Heart sounds: Normal heart sounds.  Pulmonary:     Effort: Pulmonary effort is normal.     Breath sounds: Normal breath sounds.  Musculoskeletal:     Cervical back: Normal range of motion and neck supple.     Comments: Normal Muscle Bulk and Muscle Testing Reveals:  Upper Extremities: Full ROM and Muscle Strength 5/5 Lumbar Paraspinal Tenderness: L-4-L-5 Left Greater Trochanter  Tenderness Lower Extremities : Full ROM and Muscle Strength 5/5 Arises From Table with Ease Narrow Based Gait     Skin:    General: Skin is warm and dry.  Neurological:     Mental Status: She is alert and oriented to person, place, and time.  Psychiatric:        Mood and Affect: Mood normal.  Behavior: Behavior normal.         Assessment & Plan:  1. Lumbar spondylosis with DDD and facet arthropathy.Left Lumbar Radiculitis: Continue HEP as Tolerated. Continue to Monitor.  06/10/2023 Refilled: Hydrocodone 7.5 /325 mg one tablet every 8 hours as needed for pain.  #90 tablets. . Second script e-scribe for the following month. We will continue the opioid monitoring program, this consists of regular clinic visits, examinations, urine drug screen, pill counts as well as use of [place] Controlled Substance Reporting system. A 12 month History has been reviewed on the [place] Controlled Substance Reporting System on 06/10/2023. 2. Chronic Right  knee pain:  Continue with heat/ice, exercise and Diclofenac. 06/10/2023 3. Right thalamic/internal capsule lacunar infarct with persistent left hemisensory deficits: Neurology Following. Continue to Monitor. 06/10/2023. 4.Left Greater Trochanteric Bursitis: Continue HEP as Tolerated and Alternate Ice and Heat Therapy.  Continue current medication regime. Continue to Monitor.  06/10/2023. 5. Polyarthralgia: Continue HEP as Tolerated. Continue to monitor. 06/10/2023 6. Cervicalgia: No complaints  today.  Continue to alternate with heat and Ice therapy. Continue to Monitor.  06/10/2023   F/U in 2 months

## 2023-06-11 ENCOUNTER — Other Ambulatory Visit: Payer: Self-pay

## 2023-06-11 DIAGNOSIS — N1831 Chronic kidney disease, stage 3a: Secondary | ICD-10-CM

## 2023-06-11 MED ORDER — DAPAGLIFLOZIN PROPANEDIOL 10 MG PO TABS: ORAL_TABLET | ORAL | 2 refills | Status: DC

## 2023-08-11 ENCOUNTER — Encounter: Payer: 59 | Attending: Registered Nurse | Admitting: Registered Nurse

## 2023-08-11 ENCOUNTER — Encounter: Payer: Self-pay | Admitting: Registered Nurse

## 2023-08-11 VITALS — BP 132/75 | HR 77 | Ht 67.5 in | Wt 201.0 lb

## 2023-08-11 DIAGNOSIS — G894 Chronic pain syndrome: Secondary | ICD-10-CM

## 2023-08-11 DIAGNOSIS — M2550 Pain in unspecified joint: Secondary | ICD-10-CM | POA: Diagnosis not present

## 2023-08-11 DIAGNOSIS — Z5181 Encounter for therapeutic drug level monitoring: Secondary | ICD-10-CM

## 2023-08-11 DIAGNOSIS — M47816 Spondylosis without myelopathy or radiculopathy, lumbar region: Secondary | ICD-10-CM | POA: Diagnosis not present

## 2023-08-11 DIAGNOSIS — Z79891 Long term (current) use of opiate analgesic: Secondary | ICD-10-CM

## 2023-08-11 MED ORDER — HYDROCODONE-ACETAMINOPHEN 7.5-325 MG PO TABS: 1.0000 | ORAL_TABLET | Freq: Three times a day (TID) | ORAL | 0 refills | Status: DC | PRN

## 2023-08-11 NOTE — Progress Notes (Unsigned)
Subjective:    Patient ID: Brittany Crawford, female    DOB: [birth year], 67 y.o.   MRN: [id number]  HPI: Brittany Crawford is a 67 y.o. female who returns for follow up appointment for chronic pain and medication refill. states *** pain is located in  ***. rates pain ***. current exercise regime is walking and performing stretching exercises.  [name] Morphine equivalent is *** MME.   UDS ordered today.    Pain Inventory Average Pain 8 Pain Right Now 8 My pain is intermittent, sharp, burning, dull, and aching  In the last 24 hours, has pain interfered with the following? General activity 7 Relation with others 10 Enjoyment of life 9 What TIME of day is your pain at its worst? morning  and evening Sleep (in general) Fair  Pain is worse with: bending, sitting, and standing Pain improves with: rest, heat/ice, therapy/exercise, and medication Relief from Meds: 9  Family History  Problem Relation Age of Onset   Stroke Mother    Colon cancer Maternal Uncle    Breast cancer Neg Hx    Social History   Socioeconomic History   Marital status: Married    Spouse name: Not on file   Number of children: 2   Years of education: 12   Highest education level: Not on file  Occupational History   Occupation: disability  Tobacco Use   Smoking status: Former    Current packs/day: 0.00    Types: Cigarettes    Quit date: 03/13/2017    Years since quitting: 6.4    Passive exposure: Never   Smokeless tobacco: Never   Tobacco comments:    4  to 5  cigarettes daily for years  Vaping Use   Vaping status: Never Used  Substance and Sexual Activity   Alcohol use: Not Currently    Comment: occassionally   Drug use: Yes    Types: Hydrocodone   Sexual activity: Not Currently  Other Topics Concern   Not on file  Social History Narrative   Patient is married with 2 children.   Patient is right handed.   Patient has hs education.   Patient drinks 4 cups daily.   Social Determinants  of Health   Financial Resource Strain: Low Risk  (07/31/2022)   Overall Financial Resource Strain (CARDIA)    Difficulty of Paying Living Expenses: Not hard at all  Food Insecurity: No Food Insecurity (07/31/2022)   Hunger Vital Sign    Worried About Running Out of Food in the Last Year: Never true    Ran Out of Food in the Last Year: Never true  Transportation Needs: No Transportation Needs (07/31/2022)   PRAPARE - Administrator, Civil Service (Medical): No    Lack of Transportation (Non-Medical): No  Physical Activity: Insufficiently Active (07/31/2022)   Exercise Vital Sign    Days of Exercise per Week: 3 days    Minutes of Exercise per Session: 10 min  Stress: No Stress Concern Present (07/31/2022)   [employer] of Occupational Health - Occupational Stress Questionnaire    Feeling of Stress : Not at all  Social Connections: Not on file   Past Surgical History:  Procedure Laterality Date   BREAST BIOPSY Left 04/29/2016   BREAST BIOPSY Left 04/23/2016   COLONOSCOPY     DILATATION & CURRETTAGE/HYSTEROSCOPY WITH RESECTOCOPE N/A 12/10/2012   Procedure: DILATATION & CURETTAGE/HYSTEROSCOPY WITH RESECTOCOPE;  Surgeon: Serita Kyle, MD;  Location: [facility];  Service: Gynecology;  Laterality: N/A;   FOOT SURGERY     left-pins placed   LYMPH NODE BIOPSY     POLYPECTOMY N/A 12/10/2012   Procedure: POLYPECTOMY;  Surgeon: Serita Kyle, MD;  Location: [facility];  Service: Gynecology;  Laterality: N/A;   STRABISMUS SURGERY Left 04/17/2017   Procedure: REPAIR STRABISMUS LEFT EYE;  Surgeon: Verne Carrow, MD;  Location: [facility];  Service: Ophthalmology;  Laterality: Left;   Past Surgical History:  Procedure Laterality Date   BREAST BIOPSY Left 04/29/2016   BREAST BIOPSY Left 04/23/2016   COLONOSCOPY     DILATATION & CURRETTAGE/HYSTEROSCOPY WITH RESECTOCOPE N/A 12/10/2012   Procedure: DILATATION & CURETTAGE/HYSTEROSCOPY WITH RESECTOCOPE;   Surgeon: Serita Kyle, MD;  Location: [facility];  Service: Gynecology;  Laterality: N/A;   FOOT SURGERY     left-pins placed   LYMPH NODE BIOPSY     POLYPECTOMY N/A 12/10/2012   Procedure: POLYPECTOMY;  Surgeon: Serita Kyle, MD;  Location: [facility];  Service: Gynecology;  Laterality: N/A;   STRABISMUS SURGERY Left 04/17/2017   Procedure: REPAIR STRABISMUS LEFT EYE;  Surgeon: Verne Carrow, MD;  Location: [facility];  Service: Ophthalmology;  Laterality: Left;   Past Medical History:  Diagnosis Date   Chronic back pain    H/O sarcoidosis    Hypertension    Seasonal allergies    Stroke (HCC)    BP 132/75   Pulse 77   Ht 5' 7.5" (1.715 m)   Wt 201 lb (91.2 kg)   LMP 11/13/2012   SpO2 98%   BMI 31.02 kg/m   Opioid Risk Score:   Fall Risk Score:  `1  Depression screen [facility] 2/9     08/11/2023   10:34 AM 02/05/2023    9:41 AM 01/22/2023    9:04 AM 12/11/2022    9:31 AM 11/04/2022    9:43 AM 10/14/2022   10:41 AM 08/14/2022    9:40 AM  Depression screen PHQ 2/9  Decreased Interest 0 0 0 0 0 0 0  Down, Depressed, Hopeless 0 0 0 0 0 0 0  PHQ - 2 Score 0 0 0 0 0 0 0      Review of Systems  Musculoskeletal:  Positive for back pain.  All other systems reviewed and are negative.     Objective:   Physical Exam        Assessment & Plan:  1. Lumbar spondylosis with DDD and facet arthropathy.Left Lumbar Radiculitis: Continue HEP as Tolerated. Continue to Monitor.  06/10/2023 Refilled: Hydrocodone 7.5 /325 mg one tablet every 8 hours as needed for pain.  #90 tablets. . Second script e-scribe for the following month. We will continue the opioid monitoring program, this consists of regular clinic visits, examinations, urine drug screen, pill counts as well as use of [place] Controlled Substance Reporting system. A 12 month History has been reviewed on the [place] Controlled Substance Reporting System on 06/10/2023. 2. Chronic Right  knee  pain:  Continue with heat/ice, exercise and Diclofenac. 06/10/2023 3. Right thalamic/internal capsule lacunar infarct with persistent left hemisensory deficits: Neurology Following. Continue to Monitor. 06/10/2023. 4.Left Greater Trochanteric Bursitis: Continue HEP as Tolerated and Alternate Ice and Heat Therapy.  Continue current medication regime. Continue to Monitor.  06/10/2023. 5. Polyarthralgia: Continue HEP as Tolerated. Continue to monitor. 06/10/2023 6. Cervicalgia: No complaints  today.  Continue to alternate with heat and Ice therapy. Continue to Monitor.  06/10/2023   F/U in 2 months

## 2023-08-13 ENCOUNTER — Encounter: Payer: Self-pay | Admitting: Registered Nurse

## 2023-08-15 LAB — TOXASSURE SELECT,+ANTIDEPR,UR

## 2023-08-18 NOTE — Progress Notes (Addendum)
Madelaine Bhat, CMA,acting as a Neurosurgeon for Arnette Felts, FNP.,have documented all relevant documentation on the behalf of Arnette Felts, FNP,as directed by  Arnette Felts, FNP while in the presence of Arnette Felts, FNP.  Subjective:  Patient ID: Brittany Crawford , female    DOB: 20-May-1956 , 67 y.o.   MRN: [id number]  Chief Complaint  Patient presents with   Hypertension    HPI  Patient presents today for a bp and dm follow up, Patient reports compliance with medication. Patient denies any chest pain, SOB, or headaches. Patient has no concerns today.    Continues to see pain management  Hypertension This is a chronic problem. The current episode started more than 1 year ago. The problem is unchanged. The problem is controlled. Pertinent negatives include no anxiety, chest pain, headaches or palpitations. There are no associated agents to hypertension. There are no known risk factors for coronary artery disease. Past treatments include calcium channel blockers and angiotensin blockers. The current treatment provides no improvement. There are no compliance problems.  Hypertensive end-organ damage includes kidney disease and CVA. There is no history of angina. Identifiable causes of hypertension include chronic renal disease.  Diabetes Pertinent negatives for hypoglycemia include no headaches. Pertinent negatives for diabetes include no chest pain. Diabetic complications include a CVA and nephropathy. Risk factors for coronary artery disease include obesity, post-menopausal, diabetes mellitus and hypertension. Current diabetic treatment includes oral agent (monotherapy). She is compliant with treatment all of the time. She is following a generally healthy diet. She has not had a previous visit with a dietitian. She participates in exercise intermittently. An ACE inhibitor/angiotensin II receptor blocker is being taken. She does not see a podiatrist.Eye exam is current.     Past Medical History:   Diagnosis Date   Chronic back pain    H/O sarcoidosis    Hypertension    Seasonal allergies    Stroke [facility])      Family History  Problem Relation Age of Onset   Stroke Mother    Colon cancer Maternal Uncle    Breast cancer Neg Hx      Current Outpatient Medications:    Ascorbic Acid (VITAMIN C) 1000 MG tablet, Take 1,000 mg by mouth daily. Takes when coming down with a cold., Disp: , Rfl:    azelastine (OPTIVAR) 0.05 % ophthalmic solution, SMARTSIG:1 Drop(s) In Eye(s) Twice Daily PRN, Disp: , Rfl:    bisacodyl 5 MG EC tablet, Take 1 tablet (5 mg total) by mouth daily as needed for moderate constipation., Disp: 30 tablet, Rfl: 1   Blood Glucose Monitoring Suppl (ACCU-CHEK GUIDE) w/Device KIT, TEST UPTO 4 TIMES DAILY AS DIRECTED, Disp: 1 kit, Rfl: 1   dapagliflozin propanediol (FARXIGA) 10 MG TABS tablet, TAKE 1 TABLET(10 MG) BY MOUTH DAILY BEFORE BREAKFAST, Disp: 90 tablet, Rfl: 2   diclofenac sodium (VOLTAREN) 1 % GEL, Apply 2 g topically 4 (four) times daily., Disp: 300 g, Rfl: 2   HYDROcodone-acetaminophen (NORCO) 7.5-325 MG tablet, Take 1 tablet by mouth every 8 (eight) hours as needed., Disp: 90 tablet, Rfl: 0   latanoprost (XALATAN) 0.005 % ophthalmic solution, 1 drop at bedtime., Disp: , Rfl:    losartan-hydrochlorothiazide (HYZAAR) 100-12.5 MG tablet, TAKE 1 TABLET BY MOUTH DAILY, Disp: 90 tablet, Rfl: 1   Multiple Vitamin (MULTIVITAMIN WITH MINERALS) TABS tablet, Take 1 tablet by mouth daily., Disp: , Rfl:    pravastatin (PRAVACHOL) 20 MG tablet, Take 1 tablet (20 mg total) by mouth every  evening., Disp: 90 tablet, Rfl: 1   prednisoLONE acetate (PRED FORTE) 1 % ophthalmic suspension, Place 1 drop into the left eye 4 (four) times daily., Disp: , Rfl:    triamcinolone cream (KENALOG) 0.1 %, Apply 1 Application topically at bedtime as needed (for rash)., Disp: 30 g, Rfl: 1   aspirin EC 81 MG tablet, Take 1 tablet (81 mg total) by mouth daily. Swallow whole. (Patient not taking:  Reported on 08/19/2023), Disp: 90 tablet, Rfl: 3   Olopatadine HCl 0.2 % SOLN, Apply 1 drop to eye daily as needed for allergies. (Patient not taking: Reported on 08/19/2023), Disp: , Rfl: 3   Allergies  Allergen Reactions   Gadobenate Nausea And Vomiting    Pt was fine after a few minutes , vomiting after contrast injection smills    Ivp Dye [Iodinated Contrast Media]      Review of Systems  Constitutional: Negative.   HENT: Negative.    Eyes: Negative.   Respiratory: Negative.    Cardiovascular: Negative.  Negative for chest pain and palpitations.  Gastrointestinal: Negative.   Genitourinary:  Positive for frequency. Negative for vaginal discharge.  Neurological:  Negative for headaches.     Today's Vitals   08/19/23 0914  BP: 110/60  Pulse: 97  Temp: 97.8 F (36.6 C)  TempSrc: Oral  Weight: 199 lb (90.3 kg)  Height: 5\' 8"  (1.727 m)  PainSc: 0-No pain   Body mass index is 30.26 kg/m.  Wt Readings from Last 3 Encounters:  08/19/23 199 lb (90.3 kg)  08/19/23 199 lb 9.6 oz (90.5 kg)  08/11/23 201 lb (91.2 kg)     Objective:  Physical Exam Vitals reviewed.  Constitutional:      General: She is not in acute distress.    Appearance: Normal appearance. She is well-developed. She is obese.  Cardiovascular:     Rate and Rhythm: Normal rate and regular rhythm.     Pulses: Normal pulses.     Heart sounds: Normal heart sounds. No murmur heard. Pulmonary:     Effort: Pulmonary effort is normal. No respiratory distress.     Breath sounds: Normal breath sounds. No wheezing.  Skin:    General: Skin is warm and dry.     Capillary Refill: Capillary refill takes less than 2 seconds.  Neurological:     General: No focal deficit present.     Mental Status: She is alert and oriented to person, place, and time.     Cranial Nerves: No cranial nerve deficit.     Motor: No weakness.  Psychiatric:        Mood and Affect: Mood normal.        Behavior: Behavior normal.         Thought Content: Thought content normal.        Judgment: Judgment normal.         Assessment And Plan:  Mixed hyperlipidemia Assessment & Plan: Cholesterol levels are stable.  Continue statin, tolerating well.  Orders: -     Lipid panel  Hypertensive nephropathy Assessment & Plan: Blood pressure is well controlled, continue current medications   Urinary frequency Assessment & Plan: Advised to use non glycerin based lubricants with intercourse as this can cause vaginal irritation. Will check urinalysis for possible UTI  Orders: -     POCT urinalysis dipstick  Type 2 diabetes mellitus with stage 3a chronic kidney disease, without long-term current use of insulin (HCC) Assessment & Plan: Hemoglobin A1c is slightly improved.  Continue current medications.Chronic, controlled Continue with current medications Encouraged to limit intake of sugary foods and drinks Encouraged to increase physical activity to 150 minutes per week Eye exam is up to date Suggestion is made for her to avoid simple carbohydrates from her diet including Cakes, Sweet Desserts, Ice Cream, Soda (diet and regular), Sweet Tea, Candies, Chips, Cookies, Store Bought Juices, Alcohol in Excess of 1-2 drinks a day, Artificial Sweeteners, Coffee Creamer, and "Sugar-free" Products. This will help patient to have more stable blood glucose    Orders: -     Basic metabolic panel -     Hemoglobin A1c    No follow-ups on file.  Patient was given opportunity to ask questions. Patient verbalized understanding of the plan and was able to repeat key elements of the plan. All questions were answered to their satisfaction.    Jeanell Sparrow, FNP, have reviewed all documentation for this visit. The documentation on 09/11/23 for the exam, diagnosis, procedures, and orders are all accurate and complete.   IF YOU HAVE BEEN REFERRED TO A SPECIALIST, IT MAY TAKE 1-2 WEEKS TO SCHEDULE/PROCESS THE REFERRAL. IF YOU HAVE NOT HEARD  FROM US/SPECIALIST IN TWO WEEKS, PLEASE GIVE [place] A CALL AT [phone].

## 2023-08-19 ENCOUNTER — Encounter: Payer: Self-pay | Admitting: Nurse Practitioner

## 2023-08-19 ENCOUNTER — Ambulatory Visit (INDEPENDENT_AMBULATORY_CARE_PROVIDER_SITE_OTHER): Payer: 59 | Admitting: Nurse Practitioner

## 2023-08-19 ENCOUNTER — Ambulatory Visit: Payer: 59

## 2023-08-19 VITALS — BP 110/60 | HR 97 | Temp 99.0°F | Ht 68.4 in | Wt 199.6 lb

## 2023-08-19 VITALS — BP 110/60 | HR 97 | Temp 97.8°F | Ht 68.0 in | Wt 199.0 lb

## 2023-08-19 DIAGNOSIS — I129 Hypertensive chronic kidney disease with stage 1 through stage 4 chronic kidney disease, or unspecified chronic kidney disease: Secondary | ICD-10-CM

## 2023-08-19 DIAGNOSIS — N1831 Chronic kidney disease, stage 3a: Secondary | ICD-10-CM | POA: Diagnosis not present

## 2023-08-19 DIAGNOSIS — E1122 Type 2 diabetes mellitus with diabetic chronic kidney disease: Secondary | ICD-10-CM

## 2023-08-19 DIAGNOSIS — E782 Mixed hyperlipidemia: Secondary | ICD-10-CM

## 2023-08-19 DIAGNOSIS — R350 Frequency of micturition: Secondary | ICD-10-CM | POA: Diagnosis not present

## 2023-08-19 DIAGNOSIS — Z0000 Encounter for general adult medical examination without abnormal findings: Secondary | ICD-10-CM

## 2023-08-19 DIAGNOSIS — N182 Chronic kidney disease, stage 2 (mild): Secondary | ICD-10-CM | POA: Diagnosis not present

## 2023-08-19 DIAGNOSIS — I10 Essential (primary) hypertension: Secondary | ICD-10-CM

## 2023-08-19 LAB — POCT URINALYSIS DIPSTICK
Bilirubin, UA: NEGATIVE
Blood, UA: NEGATIVE
Glucose, UA: POSITIVE — AB
Ketones, UA: NEGATIVE
Nitrite, UA: NEGATIVE
Protein, UA: NEGATIVE
Spec Grav, UA: 1.02 (ref 1.010–1.025)
Urobilinogen, UA: 1 U/dL
pH, UA: 7 (ref 5.0–8.0)

## 2023-08-19 NOTE — Assessment & Plan Note (Signed)
Hemoglobin A1c is slightly improved.  Continue current medications.Chronic, controlled Continue with current medications Encouraged to limit intake of sugary foods and drinks Encouraged to increase physical activity to 150 minutes per week Eye exam is up to date Suggestion is made for her to avoid simple carbohydrates from her diet including Cakes, Sweet Desserts, Ice Cream, Soda (diet and regular), Sweet Tea, Candies, Chips, Cookies, Store Bought Juices, Alcohol in Excess of 1-2 drinks a day, Artificial Sweeteners, Coffee Creamer, and "Sugar-free" Products. This will help patient to have more stable blood glucose

## 2023-08-19 NOTE — Progress Notes (Signed)
Subjective:   Brittany Crawford is a 67 y.o. female who presents for Medicare Annual (Subsequent) preventive examination.  Visit Complete: In person    Cardiac Risk Factors include: diabetes mellitus;dyslipidemia;hypertension;obesity (BMI >30kg/m2)     Objective:    Today's Vitals   08/19/23 0851  BP: 110/60  Pulse: 97  Temp: 99 F (37.2 C)  TempSrc: Oral  SpO2: 94%  Weight: 199 lb 9.6 oz (90.5 kg)  Height: 5' 8.4" (1.737 m)   Body mass index is 30 kg/m.     08/19/2023    9:02 AM 07/31/2022   11:38 AM 04/17/2022   10:26 AM 07/24/2021   11:59 AM 08/30/2020    9:23 AM 04/13/2019    8:57 AM 07/29/2017    1:14 PM  Advanced Directives  Does Patient Have a Medical Advance Directive? No No No No No No No  Would patient like information on creating a medical advance directive? No - Patient declined No - Patient declined  No - Patient declined No - Patient declined No - Patient declined     Current Medications (verified) Outpatient Encounter Medications as of 08/19/2023  Medication Sig   Ascorbic Acid (VITAMIN C) 1000 MG tablet Take 1,000 mg by mouth daily. Takes when coming down with a cold.   azelastine (OPTIVAR) 0.05 % ophthalmic solution SMARTSIG:1 Drop(s) In Eye(s) Twice Daily PRN   bisacodyl 5 MG EC tablet Take 1 tablet (5 mg total) by mouth daily as needed for moderate constipation.   Blood Glucose Monitoring Suppl (ACCU-CHEK GUIDE) w/Device KIT TEST UPTO 4 TIMES DAILY AS DIRECTED   dapagliflozin propanediol (FARXIGA) 10 MG TABS tablet TAKE 1 TABLET(10 MG) BY MOUTH DAILY BEFORE BREAKFAST   diclofenac sodium (VOLTAREN) 1 % GEL Apply 2 g topically 4 (four) times daily.   HYDROcodone-acetaminophen (NORCO) 7.5-325 MG tablet Take 1 tablet by mouth every 8 (eight) hours as needed.   latanoprost (XALATAN) 0.005 % ophthalmic solution 1 drop at bedtime.   losartan-hydrochlorothiazide (HYZAAR) 100-12.5 MG tablet TAKE 1 TABLET BY MOUTH DAILY   Multiple Vitamin (MULTIVITAMIN WITH  MINERALS) TABS tablet Take 1 tablet by mouth daily.   pravastatin (PRAVACHOL) 20 MG tablet Take 1 tablet (20 mg total) by mouth every evening.   prednisoLONE acetate (PRED FORTE) 1 % ophthalmic suspension Place 1 drop into the left eye 4 (four) times daily.   triamcinolone cream (KENALOG) 0.1 % Apply 1 Application topically at bedtime as needed (for rash).   aspirin EC 81 MG tablet Take 1 tablet (81 mg total) by mouth daily. Swallow whole. (Patient not taking: Reported on 08/19/2023)   Olopatadine HCl 0.2 % SOLN Apply 1 drop to eye daily as needed for allergies. (Patient not taking: Reported on 08/19/2023)   No facility-administered encounter medications on file as of 08/19/2023.    Allergies (verified) Gadobenate and Ivp dye [iodinated contrast media]   History: Past Medical History:  Diagnosis Date   Chronic back pain    H/O sarcoidosis    Hypertension    Seasonal allergies    Stroke [facility])    Past Surgical History:  Procedure Laterality Date   BREAST BIOPSY Left 04/29/2016   BREAST BIOPSY Left 04/23/2016   COLONOSCOPY     DILATATION & CURRETTAGE/HYSTEROSCOPY WITH RESECTOCOPE N/A 12/10/2012   Procedure: DILATATION & CURETTAGE/HYSTEROSCOPY WITH RESECTOCOPE;  Surgeon: Serita Kyle, MD;  Location: [facility];  Service: Gynecology;  Laterality: N/A;   FOOT SURGERY     left-pins placed   LYMPH NODE BIOPSY  POLYPECTOMY N/A 12/10/2012   Procedure: POLYPECTOMY;  Surgeon: Serita Kyle, MD;  Location: [facility];  Service: Gynecology;  Laterality: N/A;   STRABISMUS SURGERY Left 04/17/2017   Procedure: REPAIR STRABISMUS LEFT EYE;  Surgeon: Verne Carrow, MD;  Location: [facility];  Service: Ophthalmology;  Laterality: Left;   Family History  Problem Relation Age of Onset   Stroke Mother    Colon cancer Maternal Uncle    Breast cancer Neg Hx    Social History   Socioeconomic History   Marital status: Married    Spouse name: Not on file   Number of  children: 2   Years of education: 12   Highest education level: Not on file  Occupational History   Occupation: disability  Tobacco Use   Smoking status: Former    Current packs/day: 0.00    Types: Cigarettes    Quit date: 03/13/2017    Years since quitting: 6.4    Passive exposure: Never   Smokeless tobacco: Never   Tobacco comments:    4  to 5  cigarettes daily for years  Vaping Use   Vaping status: Never Used  Substance and Sexual Activity   Alcohol use: Not Currently    Comment: occassionally   Drug use: Yes    Types: Hydrocodone   Sexual activity: Not Currently  Other Topics Concern   Not on file  Social History Narrative   Patient is married with 2 children.   Patient is right handed.   Patient has hs education.   Patient drinks 4 cups daily.   Social Determinants of Health   Financial Resource Strain: Low Risk  (08/19/2023)   Overall Financial Resource Strain (CARDIA)    Difficulty of Paying Living Expenses: Not hard at all  Food Insecurity: No Food Insecurity (08/19/2023)   Hunger Vital Sign    Worried About Running Out of Food in the Last Year: Never true    Ran Out of Food in the Last Year: Never true  Transportation Needs: No Transportation Needs (08/19/2023)   PRAPARE - Administrator, Civil Service (Medical): No    Lack of Transportation (Non-Medical): No  Physical Activity: Insufficiently Active (08/19/2023)   Exercise Vital Sign    Days of Exercise per Week: 2 days    Minutes of Exercise per Session: 10 min  Stress: No Stress Concern Present (08/19/2023)   [employer] of Occupational Health - Occupational Stress Questionnaire    Feeling of Stress : Not at all  Social Connections: Moderately Integrated (08/19/2023)   Social Connection and Isolation Panel [NHANES]    Frequency of Communication with Friends and Family: Twice a week    Frequency of Social Gatherings with Friends and Family: Once a week    Attends Religious Services: More than  4 times per year    Active Member of [employer] or Organizations: Not on file    Attends Banker Meetings: Never    Marital Status: Married    Tobacco Counseling Counseling given: Not Answered Tobacco comments: 4  to 5  cigarettes daily for years   Clinical Intake:  Pre-visit preparation completed: Yes  Pain : No/denies pain     Nutritional Status: BMI > 30  Obese Nutritional Risks: None Diabetes: Yes CBG done?: No Did pt. bring in CBG monitor from home?: No  How often do you need to have someone help you when you read instructions, pamphlets, or other written materials from your doctor or pharmacy?:  1 - Never  Interpreter Needed?: No  Information entered by :: NAllen LPN   Activities of Daily Living    08/19/2023    8:54 AM  In your present state of health, do you have any difficulty performing the following activities:  Hearing? 0  Vision? 0  Difficulty concentrating or making decisions? 0  Walking or climbing stairs? 0  Dressing or bathing? 0  Doing errands, shopping? 0  Preparing Food and eating ? N  Using the Toilet? N  In the past six months, have you accidently leaked urine? N  Do you have problems with loss of bowel control? N  Managing your Medications? N  Managing your Finances? N  Housekeeping or managing your Housekeeping? N    Patient Care Team: Arnette Felts, FNP as PCP - General (General Practice)  Indicate any recent Medical Services you may have received from other than [facility] providers in the past year (date may be approximate).     Assessment:   This is a routine wellness examination for Brittany Crawford.  Hearing/Vision screen Hearing Screening - Comments:: Denies hearing issues Vision Screening - Comments:: Regular eye exams, [facility]   Goals Addressed             This Visit's Progress    Patient Stated       08/19/2023, wants to start exercising 3 days a week       Depression Screen    08/19/2023    9:04 AM  08/11/2023   10:34 AM 02/05/2023    9:41 AM 01/22/2023    9:04 AM 12/11/2022    9:31 AM 11/04/2022    9:43 AM 10/14/2022   10:41 AM  PHQ 2/9 Scores  PHQ - 2 Score 0 0 0 0 0 0 0  PHQ- 9 Score 0          Fall Risk    08/19/2023    9:04 AM 08/11/2023   10:34 AM 06/10/2023    9:44 AM 04/10/2023    9:41 AM 02/05/2023    9:41 AM  Fall Risk   Falls in the past year? 0 0 0 0 0  Number falls in past yr: 0    0  Injury with Fall? 0    0  Risk for fall due to : Medication side effect      Follow up Falls prevention discussed;Falls evaluation completed        MEDICARE RISK AT HOME: Medicare Risk at Home Any stairs in or around the home?: Yes If so, are there any without handrails?: No Home free of loose throw rugs in walkways, pet beds, electrical cords, etc?: Yes Adequate lighting in your home to reduce risk of falls?: Yes Life alert?: No Use of a cane, walker or w/c?: No Grab bars in the bathroom?: Yes Shower chair or bench in shower?: Yes Elevated toilet seat or a handicapped toilet?: Yes  TIMED UP AND GO:  Was the test performed?  Yes  Length of time to ambulate 10 feet: 5 sec Gait steady and fast without use of assistive device    Cognitive Function:        08/19/2023    9:05 AM 07/31/2022   11:42 AM 07/24/2021   12:05 PM 08/30/2020    9:27 AM 04/13/2019    9:02 AM  6CIT Screen  What Year? 0 points 0 points 0 points 0 points 0 points  What month? 0 points 0 points 0 points 0 points 0 points  What time? 0 points 0 points 0 points 0 points 0 points  Count back from 20 0 points 2 points 0 points 2 points 0 points  Months in reverse 0 points 2 points 4 points 2 points 2 points  Repeat phrase 4 points 0 points 10 points 4 points 2 points  Total Score 4 points 4 points 14 points 8 points 4 points    Immunizations Immunization History  Administered Date(s) Administered   Influenza,inj,Quad PF,6+ Mos 07/08/2014, 08/12/2018, 09/07/2019, 07/24/2021, 07/31/2022    Influenza-Unspecified 08/11/2018   Moderna Sars-Covid-2 Vaccination 12/30/2019, 01/30/2020   Pneumococcal Polysaccharide-23 07/08/2014   Tdap 03/04/2022   Zoster Recombinant(Shingrix) 03/06/2020    TDAP status: Up to date  Flu Vaccine status: Declined, Education has been provided regarding the importance of this vaccine but patient still declined. Advised may receive this vaccine at local pharmacy or Health Dept. Aware to provide a copy of the vaccination record if obtained from local pharmacy or Health Dept. Verbalized acceptance and understanding.  Pneumococcal vaccine status: Up to date  Covid-19 vaccine status: Information provided on how to obtain vaccines.   Qualifies for Shingles Vaccine? Yes   Zostavax completed No   Shingrix Completed?: declines second dose  Screening Tests Health Maintenance  Topic Date Due   OPHTHALMOLOGY EXAM  Never done   COVID-19 Vaccine (3 - Moderna risk series) 09/04/2023 (Originally 02/27/2020)   Zoster Vaccines- Shingrix (2 of 2) 11/19/2023 (Originally 05/01/2020)   INFLUENZA VACCINE  01/11/2024 (Originally 05/14/2023)   HEMOGLOBIN A1C  10/31/2023   Diabetic kidney evaluation - eGFR measurement  04/29/2024   Diabetic kidney evaluation - Urine ACR  04/29/2024   FOOT EXAM  04/29/2024   Medicare Annual Wellness (AWV)  08/18/2024   MAMMOGRAM  10/23/2024   Cervical Cancer Screening (HPV/Pap Cotest)  03/05/2027   DTaP/Tdap/Td (2 - Td or Tdap) 03/04/2032   Colonoscopy  10/17/2032   Hepatitis C Screening  Completed   HIV Screening  Completed   HPV VACCINES  Aged Out    Health Maintenance  Health Maintenance Due  Topic Date Due   OPHTHALMOLOGY EXAM  Never done    Colorectal cancer screening: Type of screening: Colonoscopy. Completed 10/17/2022. Repeat every 10 years  Mammogram status: Completed 10/23/2022. Repeat every year  Bone Density status: n/a  Lung Cancer Screening: (Low Dose CT Chest recommended if Age 66-80 years, 20 pack-year currently  smoking OR have quit w/in 15years.) does not qualify.   Lung Cancer Screening Referral: no  Additional Screening:  Hepatitis C Screening: does qualify; Completed 09/01/2018  Vision Screening: Recommended annual ophthalmology exams for early detection of glaucoma and other disorders of the eye. Is the patient up to date with their annual eye exam?  Yes  Who is the provider or what is the name of the office in which the patient attends annual eye exams? [facility] If pt is not established with a provider, would they like to be referred to a provider to establish care? No .   Dental Screening: Recommended annual dental exams for proper oral hygiene  Diabetic Foot Exam: Diabetic Foot Exam: Completed 04/30/2023  Community Resource Referral / Chronic Care Management: CRR required this visit?  No   CCM required this visit?  No     Plan:     I have personally reviewed and noted the following in the patient's chart:   Medical and social history Use of alcohol, tobacco or illicit drugs  Current medications and supplements including opioid prescriptions. Patient  is currently taking opioid prescriptions. Information provided to patient regarding non-opioid alternatives. Patient advised to discuss non-opioid treatment plan with their provider. Functional ability and status Nutritional status Physical activity Advanced directives List of other physicians Hospitalizations, surgeries, and ER visits in previous 12 months Vitals Screenings to include cognitive, depression, and falls Referrals and appointments  In addition, I have reviewed and discussed with patient certain preventive protocols, quality metrics, and best practice recommendations. A written personalized care plan for preventive services as well as general preventive health recommendations were provided to patient.     Barb Merino, LPN   [id number]   After Visit Summary: (In Person-Printed) AVS printed and given to  the patient  Nurse Notes: none

## 2023-08-19 NOTE — Assessment & Plan Note (Signed)
Cholesterol levels are stable.  Continue statin, tolerating well.

## 2023-08-19 NOTE — Assessment & Plan Note (Signed)
Advised to use non glycerin based lubricants with intercourse as this can cause vaginal irritation. Will check urinalysis for possible UTI

## 2023-08-19 NOTE — Patient Instructions (Signed)
Brittany Crawford , Thank you for taking time to come for your Medicare Wellness Visit. I appreciate your ongoing commitment to your health goals. Please review the following plan we discussed and let me know if I can assist you in the future.   Referrals/Orders/Follow-Ups/Clinician Recommendations: none  Managing Pain Without Opioids Opioids are strong medicines used to treat moderate to severe pain. For some people, especially those who have long-term (chronic) pain, opioids may not be the best choice for pain management due to: Side effects like nausea, constipation, and sleepiness. The risk of addiction (opioid use disorder). The longer you take opioids, the greater your risk of addiction. Pain that lasts for more than 3 months is called chronic pain. Managing chronic pain usually requires more than one approach and is often provided by a team of health care providers working together (multidisciplinary approach). Pain management may be done at a pain management center or pain clinic. How to manage pain without the use of opioids Use non-opioid medicines Non-opioid medicines for pain may include: Over-the-counter or prescription non-steroidal anti-inflammatory drugs (NSAIDs). These may be the first medicines used for pain. They work well for muscle and bone pain, and they reduce swelling. Acetaminophen. This over-the-counter medicine may work well for milder pain but not swelling. Antidepressants. These may be used to treat chronic pain. A certain type of antidepressant (tricyclics) is often used. These medicines are given in lower doses for pain than when used for depression. Anticonvulsants. These are usually used to treat seizures but may also reduce nerve (neuropathic) pain. Muscle relaxants. These relieve pain caused by sudden muscle tightening (spasms). You may also use a pain medicine that is applied to the skin as a patch, cream, or gel (topical analgesic), such as a numbing medicine. These  may cause fewer side effects than medicines taken by mouth. Do certain therapies as directed Some therapies can help with pain management. They include: Physical therapy. You will do exercises to gain strength and flexibility. A physical therapist may teach you exercises to move and stretch parts of your body that are weak, stiff, or painful. You can learn these exercises at physical therapy visits and practice them at home. Physical therapy may also involve: Massage. Heat wraps or applying heat or cold to affected areas. Electrical signals that interrupt pain signals (transcutaneous electrical nerve stimulation, TENS). Weak lasers that reduce pain and swelling (low-level laser therapy). Signals from your body that help you learn to regulate pain (biofeedback). Occupational therapy. This helps you to learn ways to function at home and work with less pain. Recreational therapy. This involves trying new activities or hobbies, such as a physical activity or drawing. Mental health therapy, including: Cognitive behavioral therapy (CBT). This helps you learn coping skills for dealing with pain. Acceptance and commitment therapy (ACT) to change the way you think and react to pain. Relaxation therapies, including muscle relaxation exercises and mindfulness-based stress reduction. Pain management counseling. This may be individual, family, or group counseling.  Receive medical treatments Medical treatments for pain management include: Nerve block injections. These may include a pain blocker and anti-inflammatory medicines. You may have injections: Near the spine to relieve chronic back or neck pain. Into joints to relieve back or joint pain. Into nerve areas that supply a painful area to relieve body pain. Into muscles (trigger point injections) to relieve some painful muscle conditions. A medical device placed near your spine to help block pain signals and relieve nerve pain or chronic back pain  (spinal  cord stimulation device). Acupuncture. Follow these instructions at home Medicines Take over-the-counter and prescription medicines only as told by your health care provider. If you are taking pain medicine, ask your health care providers about possible side effects to watch out for. Do not drive or use heavy machinery while taking prescription opioid pain medicine. Lifestyle  Do not use drugs or alcohol to reduce pain. If you drink alcohol, limit how much you have to: 0-1 drink a day for women who are not pregnant. 0-2 drinks a day for men. Know how much alcohol is in a drink. In the [place] one drink equals one 12 oz bottle of beer (355 mL), one 5 oz glass of wine (148 mL), or one 1 oz glass of hard liquor (44 mL). Do not use any products that contain nicotine or tobacco. These products include cigarettes, chewing tobacco, and vaping devices, such as e-cigarettes. If you need help quitting, ask your health care provider. Eat a healthy diet and maintain a healthy weight. Poor diet and excess weight may make pain worse. Eat foods that are high in fiber. These include fresh fruits and vegetables, whole grains, and beans. Limit foods that are high in fat and processed sugars, such as fried and sweet foods. Exercise regularly. Exercise lowers stress and may help relieve pain. Ask your health care provider what activities and exercises are safe for you. If your health care provider approves, join an exercise class that combines movement and stress reduction. Examples include yoga and tai chi. Get enough sleep. Lack of sleep may make pain worse. Lower stress as much as possible. Practice stress reduction techniques as told by your therapist. General instructions Work with all your pain management providers to find the treatments that work best for you. You are an important member of your pain management team. There are many things you can do to reduce pain on your own. Consider joining an  online or in-person support group for people who have chronic pain. Keep all follow-up visits. This is important. Where to find more information You can find more information about managing pain without opioids from: American Academy of Pain Medicine: painmed.org Institute for Chronic Pain: instituteforchronicpain.org American Chronic Pain Association: theacpa.org Contact a health care provider if: You have side effects from pain medicine. Your pain gets worse or does not get better with treatments or home therapy. You are struggling with anxiety or depression. Summary Many types of pain can be managed without opioids. Chronic pain may respond better to pain management without opioids. Pain is best managed when you and a team of health care providers work together. Pain management without opioids may include non-opioid medicines, medical treatments, physical therapy, mental health therapy, and lifestyle changes. Tell your health care providers if your pain gets worse or is not being managed well enough. This information is not intended to replace advice given to you by your health care provider. Make sure you discuss any questions you have with your health care provider. Document Revised: 01/09/2021 Document Reviewed: 01/09/2021 Elsevier Patient Education  2024 Elsevier Inc.   This is a list of the screening recommended for you and due dates:  Health Maintenance  Topic Date Due   Eye exam for diabetics  Never done   COVID-19 Vaccine (3 - Moderna risk series) 09/04/2023*   Zoster (Shingles) Vaccine (2 of 2) 11/19/2023*   Flu Shot  01/11/2024*   Hemoglobin A1C  10/31/2023   Yearly kidney function blood test for diabetes  04/29/2024  Yearly kidney health urinalysis for diabetes  04/29/2024   Complete foot exam   04/29/2024   Medicare Annual Wellness Visit  08/18/2024   Mammogram  10/23/2024   Pap with HPV screening  03/05/2027   DTaP/Tdap/Td vaccine (2 - Td or Tdap) 03/04/2032    Colon Cancer Screening  10/17/2032   Hepatitis C Screening  Completed   HIV Screening  Completed   HPV Vaccine  Aged Out  *Topic was postponed. The date shown is not the original due date.    Advanced directives: (Declined) Advance directive discussed with you today. Even though you declined this today, please call our office should you change your mind, and we can give you the proper paperwork for you to fill out.  Next Medicare Annual Wellness Visit scheduled for next year: Yes  insert Preventive Care Attachment Reference

## 2023-08-19 NOTE — Assessment & Plan Note (Signed)
Blood pressure is well controlled, continue current medications.

## 2023-08-20 LAB — BASIC METABOLIC PANEL
BUN/Creatinine Ratio: 10 — ABNORMAL LOW (ref 12–28)
BUN: 11 mg/dL (ref 8–27)
CO2: 25 mmol/L (ref 20–29)
Calcium: 10.2 mg/dL (ref 8.7–10.3)
Chloride: 102 mmol/L (ref 96–106)
Creatinine, Ser: 1.08 mg/dL — ABNORMAL HIGH (ref 0.57–1.00)
Glucose: 104 mg/dL — ABNORMAL HIGH (ref 70–99)
Potassium: 3.9 mmol/L (ref 3.5–5.2)
Sodium: 143 mmol/L (ref 134–144)
eGFR: 57 mL/min/{1.73_m2} — ABNORMAL LOW (ref >59)

## 2023-08-20 LAB — HEMOGLOBIN A1C
Est. average glucose Bld gHb Est-mCnc: 134 mg/dL
Hgb A1c MFr Bld: 6.3 % — ABNORMAL HIGH (ref 4.8–5.6)

## 2023-08-20 LAB — LIPID PANEL
Chol/HDL Ratio: 3.6 ratio (ref 0.0–4.4)
Cholesterol, Total: 190 mg/dL (ref 100–199)
HDL: 53 mg/dL (ref >39)
LDL Chol Calc (NIH): 100 mg/dL — ABNORMAL HIGH (ref 0–99)
Triglycerides: 217 mg/dL — ABNORMAL HIGH (ref 0–149)
VLDL Cholesterol Cal: 37 mg/dL (ref 5–40)

## 2023-09-03 DIAGNOSIS — H209 Unspecified iridocyclitis: Secondary | ICD-10-CM | POA: Diagnosis not present

## 2023-09-03 DIAGNOSIS — H40023 Open angle with borderline findings, high risk, bilateral: Secondary | ICD-10-CM | POA: Diagnosis not present

## 2023-10-05 ENCOUNTER — Encounter: Payer: Self-pay | Admitting: Nurse Practitioner

## 2023-10-13 ENCOUNTER — Encounter: Payer: 59 | Attending: Registered Nurse | Admitting: Registered Nurse

## 2023-10-13 ENCOUNTER — Encounter: Payer: Self-pay | Admitting: Registered Nurse

## 2023-10-13 VITALS — BP 128/82 | HR 78 | Ht 68.0 in | Wt 201.0 lb

## 2023-10-13 DIAGNOSIS — M2550 Pain in unspecified joint: Secondary | ICD-10-CM | POA: Diagnosis not present

## 2023-10-13 DIAGNOSIS — Z79891 Long term (current) use of opiate analgesic: Secondary | ICD-10-CM | POA: Insufficient documentation

## 2023-10-13 DIAGNOSIS — G894 Chronic pain syndrome: Secondary | ICD-10-CM | POA: Insufficient documentation

## 2023-10-13 DIAGNOSIS — Z5181 Encounter for therapeutic drug level monitoring: Secondary | ICD-10-CM | POA: Insufficient documentation

## 2023-10-13 DIAGNOSIS — M47816 Spondylosis without myelopathy or radiculopathy, lumbar region: Secondary | ICD-10-CM | POA: Diagnosis not present

## 2023-10-13 MED ORDER — HYDROCODONE-ACETAMINOPHEN 7.5-325 MG PO TABS: 1.0000 | ORAL_TABLET | Freq: Three times a day (TID) | ORAL | 0 refills | Status: DC | PRN

## 2023-10-13 NOTE — Progress Notes (Signed)
 Subjective:    Patient ID: Brittany Crawford, female    DOB: [birth year], 67 y.o.   MRN: [id number]  HPI: Brittany Crawford is a 67 y.o. female who returns for follow up appointment for chronic pain and medication refill. She states her pain is located in her lower back and generalized joint pain. She rates her pain 8. Her current exercise regime is walking and performing stretching exercises.  Ms/ Drahos Morphine equivalent is 22.50 MME.   Last UDS was Performed on 08/11/2023, it was consistent.     Pain Inventory Average Pain 8 Pain Right Now 8 My pain is intermittent, burning, stabbing, and aching  In the last 24 hours, has pain interfered with the following? General activity 10 Relation with others 10 Enjoyment of life 10 What TIME of day is your pain at its worst? morning  and night Sleep (in general) Fair  Pain is worse with: bending, standing, and some activites Pain improves with: rest, heat/ice, therapy/exercise, and medication Relief from Meds: 8  Family History  Problem Relation Age of Onset   Stroke Mother    Colon cancer Maternal Uncle    Breast cancer Neg Hx    Social History   Socioeconomic History   Marital status: Married    Spouse name: Not on file   Number of children: 2   Years of education: 12   Highest education level: Not on file  Occupational History   Occupation: disability  Tobacco Use   Smoking status: Former    Current packs/day: 0.00    Types: Cigarettes    Quit date: 03/13/2017    Years since quitting: 6.5    Passive exposure: Never   Smokeless tobacco: Never   Tobacco comments:    4  to 5  cigarettes daily for years  Vaping Use   Vaping status: Never Used  Substance and Sexual Activity   Alcohol  use: Not Currently    Comment: occassionally   Drug use: Yes    Types: Hydrocodone    Sexual activity: Not Currently  Other Topics Concern   Not on file  Social History Narrative   Patient is married with 2 children.   Patient is  right handed.   Patient has hs education.   Patient drinks 4 cups daily.   Social Drivers of Corporate Investment Banker Strain: Low Risk  (08/19/2023)   Overall Financial Resource Strain (CARDIA)    Difficulty of Paying Living Expenses: Not hard at all  Food Insecurity: No Food Insecurity (08/19/2023)   Hunger Vital Sign    Worried About Running Out of Food in the Last Year: Never true    Ran Out of Food in the Last Year: Never true  Transportation Needs: No Transportation Needs (08/19/2023)   PRAPARE - Administrator, Civil Service (Medical): No    Lack of Transportation (Non-Medical): No  Physical Activity: Insufficiently Active (08/19/2023)   Exercise Vital Sign    Days of Exercise per Week: 2 days    Minutes of Exercise per Session: 10 min  Stress: No Stress Concern Present (08/19/2023)   [employer] of Occupational Health - Occupational Stress Questionnaire    Feeling of Stress : Not at all  Social Connections: Moderately Integrated (08/19/2023)   Social Connection and Isolation Panel [NHANES]    Frequency of Communication with Friends and Family: Twice a week    Frequency of Social Gatherings with Friends and Family: Once a week    Attends Religious  Services: More than 4 times per year    Active Member of Clubs or Organizations: Not on file    Attends Club or Organization Meetings: Never    Marital Status: Married   Past Surgical History:  Procedure Laterality Date   BREAST BIOPSY Left 04/29/2016   BREAST BIOPSY Left 04/23/2016   COLONOSCOPY     DILATATION & CURRETTAGE/HYSTEROSCOPY WITH RESECTOCOPE N/A 12/10/2012   Procedure: DILATATION & CURETTAGE/HYSTEROSCOPY WITH RESECTOCOPE;  Surgeon: Dickie DELENA Carder, MD;  Location: [facility];  Service: Gynecology;  Laterality: N/A;   FOOT SURGERY     left-pins placed   LYMPH NODE BIOPSY     POLYPECTOMY N/A 12/10/2012   Procedure: POLYPECTOMY;  Surgeon: Dickie DELENA Carder, MD;  Location: [facility];  Service:  Gynecology;  Laterality: N/A;   STRABISMUS SURGERY Left 04/17/2017   Procedure: REPAIR STRABISMUS LEFT EYE;  Surgeon: Neysa Fallow, MD;  Location: [facility];  Service: Ophthalmology;  Laterality: Left;   Past Surgical History:  Procedure Laterality Date   BREAST BIOPSY Left 04/29/2016   BREAST BIOPSY Left 04/23/2016   COLONOSCOPY     DILATATION & CURRETTAGE/HYSTEROSCOPY WITH RESECTOCOPE N/A 12/10/2012   Procedure: DILATATION & CURETTAGE/HYSTEROSCOPY WITH RESECTOCOPE;  Surgeon: Dickie DELENA Carder, MD;  Location: [facility];  Service: Gynecology;  Laterality: N/A;   FOOT SURGERY     left-pins placed   LYMPH NODE BIOPSY     POLYPECTOMY N/A 12/10/2012   Procedure: POLYPECTOMY;  Surgeon: Dickie DELENA Carder, MD;  Location: [facility];  Service: Gynecology;  Laterality: N/A;   STRABISMUS SURGERY Left 04/17/2017   Procedure: REPAIR STRABISMUS LEFT EYE;  Surgeon: Neysa Fallow, MD;  Location:  S[facility]ERY [facility];  Service: Ophthalmology;  Laterality: Left;   Past Medical History:  Diagnosis Date   Chronic back pain    H/O sarcoidosis    Hypertension    Seasonal allergies    Stroke (HCC)    BP 128/82   Pulse 78   Ht 5' 8 (1.727 m)   Wt 201 lb (91.2 kg)   LMP 11/13/2012   SpO2 95%   BMI 30.56 kg/m   Opioid Risk Score:   Fall Risk Score:  `1  Depression screen [facility] 2/9     08/19/2023    9:04 AM 08/11/2023   10:34 AM 02/05/2023    9:41 AM 01/22/2023    9:04 AM 12/11/2022    9:31 AM 11/04/2022    9:43 AM 10/14/2022   10:41 AM  Depression screen PHQ 2/9  Decreased Interest 0 0 0 0 0 0 0  Down, Depressed, Hopeless 0 0 0 0 0 0 0  PHQ - 2 Score 0 0 0 0 0 0 0  Altered sleeping 0        Tired, decreased energy 0        Change in appetite 0        Feeling bad or failure about yourself  0        Trouble concentrating 0        Moving slowly or fidgety/restless 0        Suicidal thoughts 0        PHQ-9 Score 0        Difficult doing work/chores Not difficult at all            Review of Systems  Musculoskeletal:  Positive for back pain.       Bilateral inside elbow Bilateral wrist pain  All other systems reviewed and  are negative.     Objective:   Physical Exam Vitals and nursing note reviewed.  Constitutional:      Appearance: Normal appearance.  Cardiovascular:     Rate and Rhythm: Normal rate and regular rhythm.     Pulses: Normal pulses.     Heart sounds: Normal heart sounds.  Pulmonary:     Effort: Pulmonary effort is normal.     Breath sounds: Normal breath sounds.  Musculoskeletal:     Comments: Normal Muscle Bulk and Muscle Testing Reveals:  Upper Extremities: Full ROM and Muscle Strength 5/5 Bilateral AC Joint Tenderness Lumbar Paraspinal Tenderness: L-4-L-5 Lower Extremities: Full ROM and Muscle Strength 5/5 Arises from Table slowly Narrow Based  Gait     Skin:    General: Skin is warm and dry.  Neurological:     Mental Status: She is alert and oriented to person, place, and time.  Psychiatric:        Mood and Affect: Mood normal.        Behavior: Behavior normal.         Assessment & Plan:  1. Lumbar spondylosis with DDD and facet arthropathy.Left Lumbar Radiculitis: Continue HEP as Tolerated. Continue to Monitor.  10/13/2023 Refilled: Hydrocodone  7.5 /325 mg one tablet every 8 hours as needed for pain.  #90 tablets. . Second script e-scribe for the following month. We will continue the opioid monitoring program, this consists of regular clinic visits, examinations, urine drug screen, pill counts as well as use of [place]  Controlled Substance Reporting system. A 12 month History has been reviewed on the [place]  Controlled Substance Reporting System on 10/13/2023. 2. Chronic Right  knee pain:  Continue with heat/ice, exercise and Diclofenac . 10/13/2023 3. Right thalamic/internal capsule lacunar infarct with persistent left hemisensory deficits: Neurology Following. Continue to Monitor. 10/13/2023. 4.Left  Greater Trochanteric Bursitis: Continue HEP as Tolerated and Alternate Ice and Heat Therapy.  Continue current medication regime. Continue to Monitor.  10/13/2023. 5. Polyarthralgia: Continue HEP as Tolerated. Continue to monitor. 10/13/2023 6. Cervicalgia: No complaints  today.  Continue to alternate with heat and Ice therapy. Continue to Monitor.  10/13/2023   F/U in 2 months

## 2023-10-15 ENCOUNTER — Ambulatory Visit: Payer: 59 | Admitting: Nurse Practitioner

## 2023-10-15 ENCOUNTER — Encounter: Payer: Self-pay | Admitting: Nurse Practitioner

## 2023-10-15 VITALS — BP 130/74 | HR 74 | Temp 98.5°F | Ht 68.0 in | Wt 202.2 lb

## 2023-10-15 DIAGNOSIS — N1831 Chronic kidney disease, stage 3a: Secondary | ICD-10-CM | POA: Diagnosis not present

## 2023-10-15 DIAGNOSIS — N6322 Unspecified lump in the left breast, upper inner quadrant: Secondary | ICD-10-CM

## 2023-10-15 DIAGNOSIS — E1122 Type 2 diabetes mellitus with diabetic chronic kidney disease: Secondary | ICD-10-CM

## 2023-10-15 DIAGNOSIS — N644 Mastodynia: Secondary | ICD-10-CM | POA: Diagnosis not present

## 2023-10-15 MED ORDER — FLUCONAZOLE 100 MG PO TABS: 100.0000 mg | ORAL_TABLET | Freq: Every day | ORAL | 0 refills | Status: AC

## 2023-10-15 MED ORDER — DOXYCYCLINE HYCLATE 100 MG PO TABS: 100.0000 mg | ORAL_TABLET | Freq: Two times a day (BID) | ORAL | 0 refills | Status: DC

## 2023-10-15 NOTE — Progress Notes (Signed)
 LILLETTE Kristeen JINNY Gladis, CMA,acting as a neurosurgeon for Brittany Ada, FNP.,have documented all relevant documentation on the behalf of Brittany Ada, FNP,as directed by  Brittany Ada, FNP while in the presence of Brittany Ada, FNP.  Subjective:  Patient ID: Brittany Crawford , female    DOB: Sep 04, 1956 , 68 y.o.   MRN: [id number]  Chief Complaint  Patient presents with   Breast Pain    HPI  Patient presents today for tenderness in her left breast, she reports she also feels a knot in her in her left  breast. She said it is no longer tender but she still feels the knot she reports it first started about 10 days ago.      Past Medical History:  Diagnosis Date   Chronic back pain    H/O sarcoidosis    Hypertension    Seasonal allergies    Stroke [facility])      Family History  Problem Relation Age of Onset   Stroke Mother    Colon cancer Maternal Uncle    Breast cancer Neg Hx      Current Outpatient Medications:    doxycycline  (VIBRA -TABS) 100 MG tablet, Take 1 tablet (100 mg total) by mouth 2 (two) times daily., Disp: 20 tablet, Rfl: 0   fluconazole  (DIFLUCAN ) 100 MG tablet, Take 1 tablet (100 mg total) by mouth daily. Take 1 tablet by mouth now repeat in 5 days, Disp: 2 tablet, Rfl: 0   Ascorbic Acid (VITAMIN C) 1000 MG tablet, Take 1,000 mg by mouth daily. Takes when coming down with a cold., Disp: , Rfl:    aspirin  EC 81 MG tablet, Take 1 tablet (81 mg total) by mouth daily. Swallow whole. (Patient not taking: Reported on 10/13/2023), Disp: 90 tablet, Rfl: 3   azelastine  (OPTIVAR ) 0.05 % ophthalmic solution, SMARTSIG:1 Drop(s) In Eye(s) Twice Daily PRN, Disp: , Rfl:    bisacodyl  5 MG EC tablet, Take 1 tablet (5 mg total) by mouth daily as needed for moderate constipation., Disp: 30 tablet, Rfl: 1   Blood Glucose Monitoring Suppl (ACCU-CHEK GUIDE) w/Device KIT, TEST UPTO 4 TIMES DAILY AS DIRECTED, Disp: 1 kit, Rfl: 1   dapagliflozin  propanediol (FARXIGA ) 10 MG TABS tablet, TAKE 1 TABLET(10 MG)  BY MOUTH DAILY BEFORE BREAKFAST, Disp: 90 tablet, Rfl: 2   diclofenac  sodium (VOLTAREN ) 1 % GEL, Apply 2 g topically 4 (four) times daily., Disp: 300 g, Rfl: 2   HYDROcodone -acetaminophen  (NORCO) 7.5-325 MG tablet, Take 1 tablet by mouth every 8 (eight) hours as needed., Disp: 90 tablet, Rfl: 0   latanoprost (XALATAN) 0.005 % ophthalmic solution, 1 drop at bedtime., Disp: , Rfl:    losartan -hydrochlorothiazide  (HYZAAR) 100-12.5 MG tablet, TAKE 1 TABLET BY MOUTH DAILY, Disp: 90 tablet, Rfl: 1   Multiple Vitamin (MULTIVITAMIN WITH MINERALS) TABS tablet, Take 1 tablet by mouth daily., Disp: , Rfl:    Olopatadine HCl 0.2 % SOLN, Apply 1 drop to eye daily as needed for allergies., Disp: , Rfl: 3   pravastatin  (PRAVACHOL ) 20 MG tablet, Take 1 tablet (20 mg total) by mouth every evening., Disp: 90 tablet, Rfl: 1   prednisoLONE acetate (PRED FORTE) 1 % ophthalmic suspension, Place 1 drop into the left eye 4 (four) times daily., Disp: , Rfl:    triamcinolone  cream (KENALOG ) 0.1 %, Apply 1 Application topically at bedtime as needed (for rash)., Disp: 30 g, Rfl: 1   Allergies  Allergen Reactions   Gadobenate Nausea And Vomiting    Pt was fine  after a few minutes , vomiting after contrast injection smills    Ivp Dye [Iodinated Contrast Media]      Review of Systems  Constitutional: Negative.   Respiratory: Negative.    Cardiovascular: Negative.   Genitourinary:        Left breast tenderness  Neurological: Negative.   Psychiatric/Behavioral: Negative.       Today's Vitals   10/15/23 1530  BP: 130/74  Pulse: 74  Temp: 98.5 F (36.9 C)  TempSrc: Oral  Weight: 202 lb 3.2 oz (91.7 kg)  Height: 5' 8 (1.727 m)  PainSc: 0-No pain   Body mass index is 30.74 kg/m.  Wt Readings from Last 3 Encounters:  10/15/23 202 lb 3.2 oz (91.7 kg)  10/13/23 201 lb (91.2 kg)  08/19/23 199 lb (90.3 kg)     Objective:  Physical Exam Vitals reviewed.  Constitutional:      Appearance: Normal appearance.   Pulmonary:     Effort: Pulmonary effort is normal. No respiratory distress.     Breath sounds: Normal breath sounds. No wheezing.  Neurological:     General: No focal deficit present.     Mental Status: She is alert and oriented to person, place, and time.     Cranial Nerves: No cranial nerve deficit.     Motor: No weakness.  Psychiatric:        Mood and Affect: Mood normal.        Behavior: Behavior normal.        Thought Content: Thought content normal.        Judgment: Judgment normal.         Assessment And Plan:  Breast tenderness in female Assessment & Plan: Will treat for possible infection to her left breast since had previously. Will also check another breast ultrasound  Orders: -     US  LIMITED ULTRASOUND INCLUDING AXILLA LEFT BREAST ; Future  Type 2 diabetes mellitus with stage 3a chronic kidney disease, without long-term current use of insulin (HCC) Assessment & Plan: Hemoglobin A1c was increased at last visit. Chronic, controlled Continue current medications.  Encouraged to limit intake of sugary foods and drinks Encouraged to increase physical activity to 150 minutes per week    Orders: -     Microalbumin / creatinine urine ratio  Mass of upper inner quadrant of left breast Assessment & Plan: Will check breast ultrasound since has a firm area to her left breast at 4 o'clock outer and near nipple.   Orders: -     US  LIMITED ULTRASOUND INCLUDING AXILLA LEFT BREAST ; Future  Other orders -     Doxycycline  Hyclate; Take 1 tablet (100 mg total) by mouth 2 (two) times daily.  Dispense: 20 tablet; Refill: 0 -     Fluconazole ; Take 1 tablet (100 mg total) by mouth daily. Take 1 tablet by mouth now repeat in 5 days  Dispense: 2 tablet; Refill: 0    Return for keep same next. .  Patient was given opportunity to ask questions. Patient verbalized understanding of the plan and was able to repeat key elements of the plan. All questions were answered to their  satisfaction.     LILLETTE Brittany Ada, FNP, have reviewed all documentation for this visit. The documentation on 10/15/23 for the exam, diagnosis, procedures, and orders are all accurate and complete.  IF YOU HAVE BEEN REFERRED TO A SPECIALIST, IT MAY TAKE 1-2 WEEKS TO SCHEDULE/PROCESS THE REFERRAL. IF YOU HAVE NOT HEARD FROM US /SPECIALIST IN  TWO WEEKS, PLEASE GIVE US  A CALL AT [phone].

## 2023-10-16 ENCOUNTER — Other Ambulatory Visit: Payer: Self-pay | Admitting: Nurse Practitioner

## 2023-10-16 DIAGNOSIS — N644 Mastodynia: Secondary | ICD-10-CM

## 2023-10-16 DIAGNOSIS — N6322 Unspecified lump in the left breast, upper inner quadrant: Secondary | ICD-10-CM

## 2023-10-16 LAB — MICROALBUMIN / CREATININE URINE RATIO
Creatinine, Urine: 82.2 mg/dL
Microalb/Creat Ratio: 4 mg/g{creat} (ref 0–29)
Microalbumin, Urine: 3.1 ug/mL

## 2023-10-20 DIAGNOSIS — N6322 Unspecified lump in the left breast, upper inner quadrant: Secondary | ICD-10-CM | POA: Insufficient documentation

## 2023-10-20 DIAGNOSIS — N644 Mastodynia: Secondary | ICD-10-CM | POA: Insufficient documentation

## 2023-10-20 NOTE — Assessment & Plan Note (Signed)
 Will treat for possible infection to her left breast since had previously. Will also check another breast ultrasound

## 2023-10-20 NOTE — Assessment & Plan Note (Addendum)
 Will check breast ultrasound since has a firm area to her left breast at 4 o'clock outer and near nipple.

## 2023-10-20 NOTE — Assessment & Plan Note (Signed)
 Hemoglobin A1c was increased at last visit. Chronic, controlled Continue current medications.  Encouraged to limit intake of sugary foods and drinks Encouraged to increase physical activity to 150 minutes per week

## 2023-10-22 ENCOUNTER — Other Ambulatory Visit: Payer: 59

## 2023-10-27 ENCOUNTER — Other Ambulatory Visit: Payer: 59

## 2023-11-04 ENCOUNTER — Other Ambulatory Visit: Payer: Self-pay

## 2023-11-04 MED ORDER — LOSARTAN POTASSIUM-HCTZ 100-12.5 MG PO TABS: 1.0000 | ORAL_TABLET | Freq: Every day | ORAL | 1 refills | Status: DC

## 2023-11-19 ENCOUNTER — Ambulatory Visit
Admission: RE | Admit: 2023-11-19 | Discharge: 2023-11-19 | Disposition: A | Discharge status: Home / Self Care | Payer: 59 | Source: Ambulatory Visit | Attending: Nurse Practitioner | Admitting: Nurse Practitioner

## 2023-11-19 DIAGNOSIS — N6321 Unspecified lump in the left breast, upper outer quadrant: Secondary | ICD-10-CM | POA: Diagnosis not present

## 2023-11-19 DIAGNOSIS — N644 Mastodynia: Secondary | ICD-10-CM

## 2023-11-19 DIAGNOSIS — N6323 Unspecified lump in the left breast, lower outer quadrant: Secondary | ICD-10-CM | POA: Diagnosis not present

## 2023-11-19 DIAGNOSIS — N6322 Unspecified lump in the left breast, upper inner quadrant: Secondary | ICD-10-CM

## 2023-11-19 DIAGNOSIS — N6320 Unspecified lump in the left breast, unspecified quadrant: Secondary | ICD-10-CM | POA: Diagnosis not present

## 2023-11-19 DIAGNOSIS — R92333 Mammographic heterogeneous density, bilateral breasts: Secondary | ICD-10-CM | POA: Diagnosis not present

## 2023-11-20 DIAGNOSIS — H0102A Squamous blepharitis right eye, upper and lower eyelids: Secondary | ICD-10-CM | POA: Diagnosis not present

## 2023-11-20 DIAGNOSIS — H0102B Squamous blepharitis left eye, upper and lower eyelids: Secondary | ICD-10-CM | POA: Diagnosis not present

## 2023-11-26 ENCOUNTER — Other Ambulatory Visit: Payer: Self-pay | Admitting: Nurse Practitioner

## 2023-11-26 DIAGNOSIS — E782 Mixed hyperlipidemia: Secondary | ICD-10-CM

## 2023-11-26 DIAGNOSIS — E1122 Type 2 diabetes mellitus with diabetic chronic kidney disease: Secondary | ICD-10-CM

## 2023-12-09 NOTE — Progress Notes (Unsigned)
 Subjective:    Patient ID: Brittany Crawford, female    DOB: [birth year], 68 y.o.   MRN: [id number]  HPI: Brittany Crawford is a 68 y.o. female who returns for follow up appointment for chronic pain and medication refill. She states her pain is located in her neck, right shoulder and lower back pain. She reports increase intensity and frequency of neck and right shoulder pain, for the last few months  she denies falling. We will order X-rays, she verbalizes understanding. She rates her pain 8. Her current exercise regime is walking, riding her stationary bicycle for 20 minutes a day. She also is  performing stretching exercises.  Brittany Crawford Morphine equivalent is 22.50 MME.   Last UDS was Performed on 08/11/2023, it was consistent.      Pain Inventory Average Pain 6 Pain Right Now 8 My pain is intermittent, sharp, dull, and aching  In the last 24 hours, has pain interfered with the following? General activity 9 Relation with others 9 Enjoyment of life 10 What TIME of day is your pain at its worst? morning  and evening Sleep (in general) Fair  Pain is worse with: bending, standing, and some activites Pain improves with: rest, heat/ice, therapy/exercise, and medication Relief from Meds: 7  Family History  Problem Relation Age of Onset   Stroke Mother    Colon cancer Maternal Uncle    Breast cancer Neg Hx    Social History   Socioeconomic History   Marital status: Married    Spouse name: Not on file   Number of children: 2   Years of education: 12   Highest education level: Not on file  Occupational History   Occupation: disability  Tobacco Use   Smoking status: Former    Current packs/day: 0.00    Types: Cigarettes    Quit date: 03/13/2017    Years since quitting: 6.7    Passive exposure: Never   Smokeless tobacco: Never   Tobacco comments:    4  to 5  cigarettes daily for years  Vaping Use   Vaping status: Never Used  Substance and Sexual Activity   Alcohol use:  Not Currently    Comment: occassionally   Drug use: Yes    Types: Hydrocodone   Sexual activity: Not Currently  Other Topics Concern   Not on file  Social History Narrative   Patient is married with 2 children.   Patient is right handed.   Patient has hs education.   Patient drinks 4 cups daily.   Social Drivers of Corporate investment banker Strain: Low Risk  (08/19/2023)   Overall Financial Resource Strain (CARDIA)    Difficulty of Paying Living Expenses: Not hard at all  Food Insecurity: No Food Insecurity (08/19/2023)   Hunger Vital Sign    Worried About Running Out of Food in the Last Year: Never true    Ran Out of Food in the Last Year: Never true  Transportation Needs: No Transportation Needs (08/19/2023)   PRAPARE - Administrator, Civil Service (Medical): No    Lack of Transportation (Non-Medical): No  Physical Activity: Insufficiently Active (08/19/2023)   Exercise Vital Sign    Days of Exercise per Week: 2 days    Minutes of Exercise per Session: 10 min  Stress: No Stress Concern Present (08/19/2023)   [employer] of Occupational Health - Occupational Stress Questionnaire    Feeling of Stress : Not at all  Social Connections: Moderately  Integrated (08/19/2023)   Social Connection and Isolation Panel [NHANES]    Frequency of Communication with Friends and Family: Twice a week    Frequency of Social Gatherings with Friends and Family: Once a week    Attends Religious Services: More than 4 times per year    Active Member of [employer] or Organizations: Not on file    Attends Banker Meetings: Never    Marital Status: Married   Past Surgical History:  Procedure Laterality Date   BREAST BIOPSY Left 04/29/2016   BREAST BIOPSY Left 04/23/2016   COLONOSCOPY     DILATATION & CURRETTAGE/HYSTEROSCOPY WITH RESECTOCOPE N/A 12/10/2012   Procedure: DILATATION & CURETTAGE/HYSTEROSCOPY WITH RESECTOCOPE;  Surgeon: Serita Kyle, MD;  Location: [facility];  Service: Gynecology;  Laterality: N/A;   FOOT SURGERY     left-pins placed   LYMPH NODE BIOPSY     POLYPECTOMY N/A 12/10/2012   Procedure: POLYPECTOMY;  Surgeon: Serita Kyle, MD;  Location: [facility];  Service: Gynecology;  Laterality: N/A;   STRABISMUS SURGERY Left 04/17/2017   Procedure: REPAIR STRABISMUS LEFT EYE;  Surgeon: Verne Carrow, MD;  Location: [facility];  Service: Ophthalmology;  Laterality: Left;   Past Surgical History:  Procedure Laterality Date   BREAST BIOPSY Left 04/29/2016   BREAST BIOPSY Left 04/23/2016   COLONOSCOPY     DILATATION & CURRETTAGE/HYSTEROSCOPY WITH RESECTOCOPE N/A 12/10/2012   Procedure: DILATATION & CURETTAGE/HYSTEROSCOPY WITH RESECTOCOPE;  Surgeon: Serita Kyle, MD;  Location: [facility];  Service: Gynecology;  Laterality: N/A;   FOOT SURGERY     left-pins placed   LYMPH NODE BIOPSY     POLYPECTOMY N/A 12/10/2012   Procedure: POLYPECTOMY;  Surgeon: Serita Kyle, MD;  Location: [facility];  Service: Gynecology;  Laterality: N/A;   STRABISMUS SURGERY Left 04/17/2017   Procedure: REPAIR STRABISMUS LEFT EYE;  Surgeon: Verne Carrow, MD;  Location: [facility];  Service: Ophthalmology;  Laterality: Left;   Past Medical History:  Diagnosis Date   Chronic back pain    H/O sarcoidosis    Hypertension    Seasonal allergies    Stroke (HCC)    BP 125/84   Pulse 87   Ht 5\' 8"  (1.727 m)   Wt 198 lb (89.8 kg)   LMP 11/13/2012   SpO2 95%   BMI 30.11 kg/m   Opioid Risk Score:   Fall Risk Score:  `1  Depression screen [facility] 2/9     08/19/2023    9:04 AM 08/11/2023   10:34 AM 02/05/2023    9:41 AM 01/22/2023    9:04 AM 12/11/2022    9:31 AM 11/04/2022    9:43 AM 10/14/2022   10:41 AM  Depression screen PHQ 2/9  Decreased Interest 0 0 0 0 0 0 0  Down, Depressed, Hopeless 0 0 0 0 0 0 0  PHQ - 2 Score 0 0 0 0 0 0 0  Altered sleeping 0        Tired, decreased energy 0        Change in appetite 0         Feeling bad or failure about yourself  0        Trouble concentrating 0        Moving slowly or fidgety/restless 0        Suicidal thoughts 0        PHQ-9 Score 0        Difficult doing work/chores  Not difficult at all          Review of Systems  Musculoskeletal:  Positive for neck pain.       Bilateral shoulder pain Left arm tingling  All other systems reviewed and are negative.     Objective:   Physical Exam Vitals and nursing note reviewed.  Constitutional:      Appearance: Normal appearance.  Neck:     Comments: Cervical Paraspinal Tenderness: C-5-C-6 Cardiovascular:     Rate and Rhythm: Normal rate and regular rhythm.     Pulses: Normal pulses.     Heart sounds: Normal heart sounds.  Pulmonary:     Effort: Pulmonary effort is normal.     Breath sounds: Normal breath sounds.  Musculoskeletal:     Comments: Normal Muscle Bulk and Muscle Testing Reveals:  Upper Extremities: Full ROM and Muscle Strength 5/5 Right AC Joint Tenderness Thoracic Paraspinal Tenderness: T-1-T-3 Mainly Right Side  Lumbar Paraspinal Tenderness: L-4-L-5 Lower Extremities: Full ROM and Muscle Strength 5/5  Arises from Table with ease Narrow Based Gait     Skin:    General: Skin is warm and dry.  Neurological:     Mental Status: She is alert and oriented to person, place, and time.  Psychiatric:        Mood and Affect: Mood normal.        Behavior: Behavior normal.         Assessment & Plan:  1. Lumbar spondylosis with DDD and facet arthropathy.Left Lumbar Radiculitis: Continue HEP as Tolerated. Continue to Monitor.  12/10/2023 Refilled: Hydrocodone 7.5 /325 mg one tablet every 8 hours as needed for pain.  #90 tablets. . Second script e-scribe for the following month. We will continue the opioid monitoring program, this consists of regular clinic visits, examinations, urine drug screen, pill counts as well as use of [place] Controlled Substance Reporting system. A 12 month  History has been reviewed on the [place] Controlled Substance Reporting System on 12/10/2023. 2. Chronic Right  knee pain:  Continue with heat/ice, exercise and Diclofenac. 12/10/2023 3. Right thalamic/internal capsule lacunar infarct with persistent left hemisensory deficits: Neurology Following. Continue to Monitor. 12/10/2023. 4.Left Greater Trochanteric Bursitis: Continue HEP as Tolerated and Alternate Ice and Heat Therapy.  Continue current medication regime. Continue to Monitor.  12/10/2023. 5. Polyarthralgia: Continue HEP as Tolerated. Continue to monitor. 12/10/2023 6. Cervicalgia: RX: DG: Cervical: X-ray.  Continue to alternate with heat and Ice therapy. Continue to Monitor. 12/10/2023 7. Right Shoulder Pain: RX: DG: X-ray.   F/U in 2 months

## 2023-12-10 ENCOUNTER — Encounter: Payer: Self-pay | Admitting: Registered Nurse

## 2023-12-10 ENCOUNTER — Encounter: Payer: 59 | Attending: Registered Nurse | Admitting: Registered Nurse

## 2023-12-10 VITALS — BP 125/84 | HR 87 | Ht 68.0 in | Wt 198.0 lb

## 2023-12-10 DIAGNOSIS — M2550 Pain in unspecified joint: Secondary | ICD-10-CM | POA: Diagnosis not present

## 2023-12-10 DIAGNOSIS — M25511 Pain in right shoulder: Secondary | ICD-10-CM | POA: Diagnosis not present

## 2023-12-10 DIAGNOSIS — M542 Cervicalgia: Secondary | ICD-10-CM

## 2023-12-10 DIAGNOSIS — M47816 Spondylosis without myelopathy or radiculopathy, lumbar region: Secondary | ICD-10-CM

## 2023-12-10 DIAGNOSIS — Z79891 Long term (current) use of opiate analgesic: Secondary | ICD-10-CM | POA: Diagnosis not present

## 2023-12-10 DIAGNOSIS — G894 Chronic pain syndrome: Secondary | ICD-10-CM

## 2023-12-10 DIAGNOSIS — Z5181 Encounter for therapeutic drug level monitoring: Secondary | ICD-10-CM

## 2023-12-10 MED ORDER — HYDROCODONE-ACETAMINOPHEN 7.5-325 MG PO TABS: 1.0000 | ORAL_TABLET | Freq: Three times a day (TID) | ORAL | 0 refills | Status: DC | PRN

## 2023-12-11 ENCOUNTER — Ambulatory Visit
Admission: RE | Admit: 2023-12-11 | Discharge: 2023-12-11 | Disposition: A | Discharge status: Home / Self Care | Payer: 59 | Source: Ambulatory Visit | Attending: Registered Nurse | Admitting: Registered Nurse

## 2023-12-11 DIAGNOSIS — M5412 Radiculopathy, cervical region: Secondary | ICD-10-CM | POA: Diagnosis not present

## 2023-12-11 DIAGNOSIS — M25511 Pain in right shoulder: Secondary | ICD-10-CM | POA: Diagnosis not present

## 2024-02-09 ENCOUNTER — Encounter: Payer: Self-pay | Admitting: Registered Nurse

## 2024-02-09 ENCOUNTER — Encounter: Payer: 59 | Attending: Registered Nurse | Admitting: Registered Nurse

## 2024-02-09 VITALS — BP 127/63 | HR 81 | Ht 68.0 in | Wt 202.4 lb

## 2024-02-09 DIAGNOSIS — M542 Cervicalgia: Secondary | ICD-10-CM | POA: Diagnosis not present

## 2024-02-09 DIAGNOSIS — Z5181 Encounter for therapeutic drug level monitoring: Secondary | ICD-10-CM | POA: Insufficient documentation

## 2024-02-09 DIAGNOSIS — M47816 Spondylosis without myelopathy or radiculopathy, lumbar region: Secondary | ICD-10-CM | POA: Diagnosis not present

## 2024-02-09 DIAGNOSIS — M7062 Trochanteric bursitis, left hip: Secondary | ICD-10-CM | POA: Diagnosis not present

## 2024-02-09 DIAGNOSIS — G894 Chronic pain syndrome: Secondary | ICD-10-CM | POA: Insufficient documentation

## 2024-02-09 DIAGNOSIS — Z79891 Long term (current) use of opiate analgesic: Secondary | ICD-10-CM | POA: Diagnosis not present

## 2024-02-09 DIAGNOSIS — M7061 Trochanteric bursitis, right hip: Secondary | ICD-10-CM | POA: Insufficient documentation

## 2024-02-09 MED ORDER — HYDROCODONE-ACETAMINOPHEN 7.5-325 MG PO TABS
1.0000 | ORAL_TABLET | Freq: Three times a day (TID) | ORAL | 0 refills | Status: DC | PRN
Start: 2024-02-09 — End: 2024-04-05

## 2024-02-09 MED ORDER — HYDROCODONE-ACETAMINOPHEN 7.5-325 MG PO TABS: 1.0000 | ORAL_TABLET | Freq: Three times a day (TID) | ORAL | 0 refills | Status: DC | PRN

## 2024-02-09 NOTE — Progress Notes (Signed)
 Subjective:    Patient ID: Brittany Crawford, female    DOB: 1956/06/04, 68 y.o.   MRN: [id number]  HPI: Brittany Crawford is a 68 y.o. female who returns for follow up appointment for chronic pain and medication refill. She states her pain is located in her neck lower back and bilateral hips. She rates her pain 5. Her current exercise regime is walking and performing stretching exercises.  Ms. Thole Morphine equivalent is 22.50 MME.   Oral Swab was Performed today.     Pain Inventory Average Pain 8 Pain Right Now 5 My pain is intermittent, sharp, dull, and aching  In the last 24 hours, has pain interfered with the following? General activity 8 Relation with others 10 Enjoyment of life 10 What TIME of day is your pain at its worst? morning  and evening Sleep (in general) NA  Pain is worse with: some activites Pain improves with: medication Relief from Meds: 8  Family History  Problem Relation Age of Onset   Stroke Mother    Colon cancer Maternal Uncle    Breast cancer Neg Hx    Social History   Socioeconomic History   Marital status: Married    Spouse name: Not on file   Number of children: 2   Years of education: 12   Highest education level: Not on file  Occupational History   Occupation: disability  Tobacco Use   Smoking status: Former    Current packs/day: 0.00    Types: Cigarettes    Quit date: 03/13/2017    Years since quitting: 6.9    Passive exposure: Never   Smokeless tobacco: Never   Tobacco comments:    4  to 5  cigarettes daily for years  Vaping Use   Vaping status: Never Used  Substance and Sexual Activity   Alcohol  use: Not Currently    Comment: occassionally   Drug use: Yes    Types: Hydrocodone    Sexual activity: Not Currently  Other Topics Concern   Not on file  Social History Narrative   Patient is married with 2 children.   Patient is right handed.   Patient has hs education.   Patient drinks 4 cups daily.   Social Drivers of  Corporate investment banker Strain: Low Risk  (08/19/2023)   Overall Financial Resource Strain (CARDIA)    Difficulty of Paying Living Expenses: Not hard at all  Food Insecurity: No Food Insecurity (08/19/2023)   Hunger Vital Sign    Worried About Running Out of Food in the Last Year: Never true    Ran Out of Food in the Last Year: Never true  Transportation Needs: No Transportation Needs (08/19/2023)   PRAPARE - Administrator, Civil Service (Medical): No    Lack of Transportation (Non-Medical): No  Physical Activity: Insufficiently Active (08/19/2023)   Exercise Vital Sign    Days of Exercise per Week: 2 days    Minutes of Exercise per Session: 10 min  Stress: No Stress Concern Present (08/19/2023)   [employer] of Occupational Health - Occupational Stress Questionnaire    Feeling of Stress : Not at all  Social Connections: Moderately Integrated (08/19/2023)   Social Connection and Isolation Panel [NHANES]    Frequency of Communication with Friends and Family: Twice a week    Frequency of Social Gatherings with Friends and Family: Once a week    Attends Religious Services: More than 4 times per year    Active  Member of Clubs or Organizations: Not on file    Attends Club or Organization Meetings: Never    Marital Status: Married   Past Surgical History:  Procedure Laterality Date   BREAST BIOPSY Left 04/29/2016   BREAST BIOPSY Left 04/23/2016   COLONOSCOPY     DILATATION & CURRETTAGE/HYSTEROSCOPY WITH RESECTOCOPE N/A 12/10/2012   Procedure: DILATATION & CURETTAGE/HYSTEROSCOPY WITH RESECTOCOPE;  Surgeon: Kandra Orn, MD;  Location: [facility];  Service: Gynecology;  Laterality: N/A;   FOOT SURGERY     left-pins placed   LYMPH NODE BIOPSY     POLYPECTOMY N/A 12/10/2012   Procedure: POLYPECTOMY;  Surgeon: Kandra Orn, MD;  Location: [facility];  Service: Gynecology;  Laterality: N/A;   STRABISMUS SURGERY Left 04/17/2017   Procedure: REPAIR STRABISMUS LEFT  EYE;  Surgeon: Dorothey Gate, MD;  Location: [facility];  Service: Ophthalmology;  Laterality: Left;   Past Surgical History:  Procedure Laterality Date   BREAST BIOPSY Left 04/29/2016   BREAST BIOPSY Left 04/23/2016   COLONOSCOPY     DILATATION & CURRETTAGE/HYSTEROSCOPY WITH RESECTOCOPE N/A 12/10/2012   Procedure: DILATATION & CURETTAGE/HYSTEROSCOPY WITH RESECTOCOPE;  Surgeon: Kandra Orn, MD;  Location: [facility];  Service: Gynecology;  Laterality: N/A;   FOOT SURGERY     left-pins placed   LYMPH NODE BIOPSY     POLYPECTOMY N/A 12/10/2012   Procedure: POLYPECTOMY;  Surgeon: Kandra Orn, MD;  Location: [facility];  Service: Gynecology;  Laterality: N/A;   STRABISMUS SURGERY Left 04/17/2017   Procedure: REPAIR STRABISMUS LEFT EYE;  Surgeon: Dorothey Gate, MD;  Location: [facility];  Service: Ophthalmology;  Laterality: Left;   Past Medical History:  Diagnosis Date   Chronic back pain    H/O sarcoidosis    Hypertension    Seasonal allergies    Stroke (HCC)    BP 127/63   Pulse 81   Ht 5\' 8"  (1.727 m)   Wt 202 lb 6.4 oz (91.8 kg)   LMP 11/13/2012   SpO2 93%   BMI 30.77 kg/m   Opioid Risk Score:   Fall Risk Score:  `1  Depression screen [facility] 2/9     08/19/2023    9:04 AM 08/11/2023   10:34 AM 02/05/2023    9:41 AM 01/22/2023    9:04 AM 12/11/2022    9:31 AM 11/04/2022    9:43 AM 10/14/2022   10:41 AM  Depression screen PHQ 2/9  Decreased Interest 0 0 0 0 0 0 0  Down, Depressed, Hopeless 0 0 0 0 0 0 0  PHQ - 2 Score 0 0 0 0 0 0 0  Altered sleeping 0        Tired, decreased energy 0        Change in appetite 0        Feeling bad or failure about yourself  0        Trouble concentrating 0        Moving slowly or fidgety/restless 0        Suicidal thoughts 0        PHQ-9 Score 0        Difficult doing work/chores Not difficult at all          Review of Systems  Musculoskeletal:  Positive for back pain.  All other systems  reviewed and are negative.      Objective:   Physical Exam Vitals and nursing note reviewed.  Constitutional:  Appearance: Normal appearance.  Neck:     Comments: Cervical Paraspinal Tenderness: C-5-C-6 Cardiovascular:     Rate and Rhythm: Normal rate and regular rhythm.     Pulses: Normal pulses.     Heart sounds: Normal heart sounds.  Pulmonary:     Effort: Pulmonary effort is normal.     Breath sounds: Normal breath sounds.  Musculoskeletal:     Comments: Normal Muscle Bulk and Muscle Testing Reveals:  Upper Extremities: Full ROM and Muscle Strength 5/5  Lumbar Paraspinal Tenderness: L-4-L-5 Lower Extremities: Full ROM and Muscle Strength 5/5 Arises from Table with ease Narrow Based  Gait     Skin:    General: Skin is warm and dry.  Neurological:     Mental Status: She is alert and oriented to person, place, and time.  Psychiatric:        Mood and Affect: Mood normal.        Behavior: Behavior normal.         Assessment & Plan:  1. Lumbar spondylosis with DDD and facet arthropathy.Left Lumbar Radiculitis: Continue HEP as Tolerated. Continue to Monitor.  02/09/2024 Refilled: Hydrocodone  7.5 /325 mg one tablet every 8 hours as needed for pain.  #90 tablets. . Second script e-scribe for the following month. We will continue the opioid monitoring program, this consists of regular clinic visits, examinations, urine drug screen, pill counts as well as use of [place]  Controlled Substance Reporting system. A 12 month History has been reviewed on the [place]  Controlled Substance Reporting System on 02/09/2024. 2. Chronic Right  knee pain:  Continue with heat/ice, exercise and Diclofenac . 02/09/2024 3. Right thalamic/internal capsule lacunar infarct with persistent left hemisensory deficits: Neurology Following. Continue to Monitor. 02/09/2024. 4.Left Greater Trochanteric Bursitis: Continue HEP as Tolerated and Alternate Ice and Heat Therapy.  Continue current  medication regime. Continue to Monitor.  02/09/2024. 5. Polyarthralgia: Continue HEP as Tolerated. Continue to monitor. 02/09/2024 6. Cervicalgia:  Cervical: X-ray: Reviewed. .  Continue to alternate with heat and Ice therapy. Continue to Monitor. 02/09/2024 7. Right Shoulder Pain:  X-ray was Reviewed.    F/U in 2 months

## 2024-02-13 LAB — DRUG TOX MONITOR 1 W/CONF, ORAL FLD
Amphetamines: NEGATIVE ng/mL (ref <10)
Barbiturates: NEGATIVE ng/mL (ref <10)
Benzodiazepines: NEGATIVE ng/mL (ref <0.50)
Buprenorphine: NEGATIVE ng/mL (ref <0.10)
Cocaine: NEGATIVE ng/mL (ref <5.0)
Codeine: NEGATIVE ng/mL (ref <2.5)
Dihydrocodeine: 7.2 ng/mL — ABNORMAL HIGH (ref <2.5)
Fentanyl: NEGATIVE ng/mL (ref <0.10)
Heroin Metabolite: NEGATIVE ng/mL (ref <1.0)
Hydrocodone: 84 ng/mL — ABNORMAL HIGH (ref <2.5)
Hydromorphone: NEGATIVE ng/mL (ref <2.5)
MARIJUANA: NEGATIVE ng/mL (ref <2.5)
MDMA: NEGATIVE ng/mL (ref <10)
Meprobamate: NEGATIVE ng/mL (ref <2.5)
Methadone: NEGATIVE ng/mL (ref <5.0)
Morphine: NEGATIVE ng/mL (ref <2.5)
Nicotine Metabolite: NEGATIVE ng/mL (ref <5.0)
Norhydrocodone: NEGATIVE ng/mL (ref <2.5)
Noroxycodone: NEGATIVE ng/mL (ref <2.5)
Opiates: POSITIVE ng/mL — AB (ref <2.5)
Oxycodone: NEGATIVE ng/mL (ref <2.5)
Oxymorphone: NEGATIVE ng/mL (ref <2.5)
Phencyclidine: NEGATIVE ng/mL (ref <10)
Tapentadol: NEGATIVE ng/mL (ref <5.0)
Tramadol: NEGATIVE ng/mL (ref <5.0)
Zolpidem: NEGATIVE ng/mL (ref <5.0)

## 2024-02-13 LAB — DRUG TOX ALC METAB W/CON, ORAL FLD: Alcohol Metabolite: NEGATIVE ng/mL (ref <25)

## 2024-03-21 DIAGNOSIS — H0102B Squamous blepharitis left eye, upper and lower eyelids: Secondary | ICD-10-CM | POA: Diagnosis not present

## 2024-03-21 DIAGNOSIS — H40023 Open angle with borderline findings, high risk, bilateral: Secondary | ICD-10-CM | POA: Diagnosis not present

## 2024-03-21 DIAGNOSIS — H0102A Squamous blepharitis right eye, upper and lower eyelids: Secondary | ICD-10-CM | POA: Diagnosis not present

## 2024-04-05 ENCOUNTER — Encounter: Attending: Registered Nurse | Admitting: Registered Nurse

## 2024-04-05 ENCOUNTER — Encounter: Payer: Self-pay | Admitting: Registered Nurse

## 2024-04-05 VITALS — BP 125/67 | HR 88 | Ht 68.0 in | Wt 199.0 lb

## 2024-04-05 DIAGNOSIS — Z79891 Long term (current) use of opiate analgesic: Secondary | ICD-10-CM | POA: Diagnosis not present

## 2024-04-05 DIAGNOSIS — G894 Chronic pain syndrome: Secondary | ICD-10-CM | POA: Diagnosis not present

## 2024-04-05 DIAGNOSIS — Z5181 Encounter for therapeutic drug level monitoring: Secondary | ICD-10-CM | POA: Diagnosis not present

## 2024-04-05 DIAGNOSIS — M47816 Spondylosis without myelopathy or radiculopathy, lumbar region: Secondary | ICD-10-CM | POA: Insufficient documentation

## 2024-04-05 MED ORDER — HYDROCODONE-ACETAMINOPHEN 7.5-325 MG PO TABS: 1.0000 | ORAL_TABLET | Freq: Three times a day (TID) | ORAL | 0 refills | Status: DC | PRN

## 2024-04-05 NOTE — Progress Notes (Signed)
 Subjective:    Patient ID: Brittany Crawford, female    DOB: [birth year], 68 y.o.   MRN: [id number]  HPI: Brittany Crawford is a 68 y.o. female who returns for follow up appointment for chronic pain and medication refill. She states her pain is located in her lower back. She rates her pain 8. Her current exercise regime is walking and performing stretching exercises.  [name] Morphine equivalent is 22.50 MME.   Last Oral Swab was Performed on 02/09/2024, it was consistent.    Pain Inventory Average Pain 8 Pain Right Now 8 My pain is intermittent, sharp, burning, dull, stabbing, tingling, and aching  In the last 24 hours, has pain interfered with the following? General activity 5 Relation with others 5 Enjoyment of life 0 What TIME of day is your pain at its worst? evening Sleep (in general) Fair  Pain is worse with: bending, standing, and some activites Pain improves with: rest, heat/ice, and medication Relief from Meds: 7  Family History  Problem Relation Age of Onset   Stroke Mother    Colon cancer Maternal Uncle    Breast cancer Neg Hx    Social History   Socioeconomic History   Marital status: Married    Spouse name: Not on file   Number of children: 2   Years of education: 12   Highest education level: Not on file  Occupational History   Occupation: disability  Tobacco Use   Smoking status: Former    Current packs/day: 0.00    Types: Cigarettes    Quit date: 03/13/2017    Years since quitting: 7.0    Passive exposure: Never   Smokeless tobacco: Never   Tobacco comments:    4  to 5  cigarettes daily for years  Vaping Use   Vaping status: Never Used  Substance and Sexual Activity   Alcohol  use: Not Currently    Comment: occassionally   Drug use: Yes    Types: Hydrocodone    Sexual activity: Not Currently  Other Topics Concern   Not on file  Social History Narrative   Patient is married with 2 children.   Patient is right handed.   Patient has hs  education.   Patient drinks 4 cups daily.   Social Drivers of Corporate investment banker Strain: Low Risk  (08/19/2023)   Overall Financial Resource Strain (CARDIA)    Difficulty of Paying Living Expenses: Not hard at all  Food Insecurity: No Food Insecurity (08/19/2023)   Hunger Vital Sign    Worried About Running Out of Food in the Last Year: Never true    Ran Out of Food in the Last Year: Never true  Transportation Needs: No Transportation Needs (08/19/2023)   PRAPARE - Administrator, Civil Service (Medical): No    Lack of Transportation (Non-Medical): No  Physical Activity: Insufficiently Active (08/19/2023)   Exercise Vital Sign    Days of Exercise per Week: 2 days    Minutes of Exercise per Session: 10 min  Stress: No Stress Concern Present (08/19/2023)   [employer] of Occupational Health - Occupational Stress Questionnaire    Feeling of Stress : Not at all  Social Connections: Moderately Integrated (08/19/2023)   Social Connection and Isolation Panel    Frequency of Communication with Friends and Family: Twice a week    Frequency of Social Gatherings with Friends and Family: Once a week    Attends Religious Services: More than 4 times per  year    Active Member of Clubs or Organizations: Not on file    Attends Club or Organization Meetings: Never    Marital Status: Married   Past Surgical History:  Procedure Laterality Date   BREAST BIOPSY Left 04/29/2016   BREAST BIOPSY Left 04/23/2016   COLONOSCOPY     DILATATION & CURRETTAGE/HYSTEROSCOPY WITH RESECTOCOPE N/A 12/10/2012   Procedure: DILATATION & CURETTAGE/HYSTEROSCOPY WITH RESECTOCOPE;  Surgeon: Dickie DELENA Carder, MD;  Location: [facility];  Service: Gynecology;  Laterality: N/A;   FOOT SURGERY     left-pins placed   LYMPH NODE BIOPSY     POLYPECTOMY N/A 12/10/2012   Procedure: POLYPECTOMY;  Surgeon: Dickie DELENA Carder, MD;  Location: [facility];  Service: Gynecology;  Laterality: N/A;   STRABISMUS  SURGERY Left 04/17/2017   Procedure: REPAIR STRABISMUS LEFT EYE;  Surgeon: Neysa Fallow, MD;  Location: [facility];  Service: Ophthalmology;  Laterality: Left;   Past Surgical History:  Procedure Laterality Date   BREAST BIOPSY Left 04/29/2016   BREAST BIOPSY Left 04/23/2016   COLONOSCOPY     DILATATION & CURRETTAGE/HYSTEROSCOPY WITH RESECTOCOPE N/A 12/10/2012   Procedure: DILATATION & CURETTAGE/HYSTEROSCOPY WITH RESECTOCOPE;  Surgeon: Dickie DELENA Carder, MD;  Location: [facility];  Service: Gynecology;  Laterality: N/A;   FOOT SURGERY     left-pins placed   LYMPH NODE BIOPSY     POLYPECTOMY N/A 12/10/2012   Procedure: POLYPECTOMY;  Surgeon: Dickie DELENA Carder, MD;  Location: [facility];  Service: Gynecology;  Laterality: N/A;   STRABISMUS SURGERY Left 04/17/2017   Procedure: REPAIR STRABISMUS LEFT EYE;  Surgeon: Neysa Fallow, MD;  Location: [facility];  Service: Ophthalmology;  Laterality: Left;   Past Medical History:  Diagnosis Date   Chronic back pain    H/O sarcoidosis    Hypertension    Seasonal allergies    Stroke (HCC)    BP 125/67 (BP Location: Left Arm, Patient Position: Sitting, Cuff Size: Large)   Pulse 88   Ht 5' 8 (1.727 m)   Wt 199 lb (90.3 kg)   LMP 11/13/2012   SpO2 95%   BMI 30.26 kg/m   Opioid Risk Score:   Fall Risk Score:  `1  Depression screen [facility] 2/9     08/19/2023    9:04 AM 08/11/2023   10:34 AM 02/05/2023    9:41 AM 01/22/2023    9:04 AM 12/11/2022    9:31 AM 11/04/2022    9:43 AM 10/14/2022   10:41 AM  Depression screen PHQ 2/9  Decreased Interest 0 0 0 0 0 0 0  Down, Depressed, Hopeless 0 0 0 0 0 0 0  PHQ - 2 Score 0 0 0 0 0 0 0  Altered sleeping 0        Tired, decreased energy 0        Change in appetite 0        Feeling bad or failure about yourself  0        Trouble concentrating 0        Moving slowly or fidgety/restless 0        Suicidal thoughts 0        PHQ-9 Score 0        Difficult doing  work/chores Not difficult at all            Review of Systems  Musculoskeletal:  Positive for back pain.       Lower back pain  Objective:   Physical Exam Vitals and nursing note reviewed.  Constitutional:      Appearance: Normal appearance.   Cardiovascular:     Rate and Rhythm: Normal rate and regular rhythm.     Pulses: Normal pulses.     Heart sounds: Normal heart sounds.  Pulmonary:     Effort: Pulmonary effort is normal.     Breath sounds: Normal breath sounds.   Musculoskeletal:     Comments: Normal Muscle Bulk and Muscle Testing Reveals:  Upper Extremities: Full ROM and Muscle Strength 5/5  Lumbar Paraspinal Tenderness: L-4-L-5 Lower Extremities: Full ROM and Muscle Strength 5/5 Arises from Table with ease Narrow Based Gait      Skin:    General: Skin is warm and dry.   Neurological:     Mental Status: She is alert and oriented to person, place, and time.   Psychiatric:        Mood and Affect: Mood normal.        Behavior: Behavior normal.         Assessment & Plan:  1. Lumbar spondylosis with DDD and facet arthropathy.Left Lumbar Radiculitis: Continue HEP as Tolerated. Continue to Monitor.  04/05/2024 Refilled: Hydrocodone  7.5 /325 mg one tablet every 8 hours as needed for pain.  #90 tablets. . Second script e-scribe for the following month. We will continue the opioid monitoring program, this consists of regular clinic visits, examinations, urine drug screen, pill counts as well as use of [place]  Controlled Substance Reporting system. A 12 month History has been reviewed on the   Controlled Substance Reporting System on 04/05/2024. 2. Chronic Right  knee pain:  Continue with heat/ice, exercise and Diclofenac . 04/05/2024 3. Right thalamic/internal capsule lacunar infarct with persistent left hemisensory deficits: Neurology Following. Continue to Monitor. 04/05/2024. 4.Left Greater Trochanteric Bursitis: Continue HEP as Tolerated  and Alternate Ice and Heat Therapy.  Continue current medication regime. Continue to Monitor.  04/05/2024. 5. Polyarthralgia: Continue HEP as Tolerated. Continue to monitor. 04/05/2024 6. Cervicalgia: No complaints Today. Continue to alternate with heat and Ice therapy. Continue to Monitor. 04/05/2024 7. Right Shoulder Pain:  No complaints today.    F/U in 2 months

## 2024-04-18 ENCOUNTER — Other Ambulatory Visit: Payer: Self-pay | Admitting: Nurse Practitioner

## 2024-04-18 DIAGNOSIS — N1831 Chronic kidney disease, stage 3a: Secondary | ICD-10-CM

## 2024-05-03 ENCOUNTER — Encounter: Payer: 59 | Admitting: Nurse Practitioner

## 2024-05-03 DIAGNOSIS — E782 Mixed hyperlipidemia: Secondary | ICD-10-CM

## 2024-05-03 DIAGNOSIS — Z0000 Encounter for general adult medical examination without abnormal findings: Secondary | ICD-10-CM

## 2024-05-03 DIAGNOSIS — E1122 Type 2 diabetes mellitus with diabetic chronic kidney disease: Secondary | ICD-10-CM

## 2024-05-03 DIAGNOSIS — I129 Hypertensive chronic kidney disease with stage 1 through stage 4 chronic kidney disease, or unspecified chronic kidney disease: Secondary | ICD-10-CM

## 2024-06-07 ENCOUNTER — Encounter: Payer: Self-pay | Admitting: Registered Nurse

## 2024-06-07 ENCOUNTER — Encounter: Attending: Registered Nurse | Admitting: Registered Nurse

## 2024-06-07 VITALS — BP 136/82 | HR 85 | Ht 68.0 in | Wt 198.0 lb

## 2024-06-07 DIAGNOSIS — G894 Chronic pain syndrome: Secondary | ICD-10-CM | POA: Insufficient documentation

## 2024-06-07 DIAGNOSIS — Z5181 Encounter for therapeutic drug level monitoring: Secondary | ICD-10-CM | POA: Diagnosis not present

## 2024-06-07 DIAGNOSIS — M47816 Spondylosis without myelopathy or radiculopathy, lumbar region: Secondary | ICD-10-CM | POA: Diagnosis not present

## 2024-06-07 DIAGNOSIS — Z79891 Long term (current) use of opiate analgesic: Secondary | ICD-10-CM | POA: Diagnosis not present

## 2024-06-07 DIAGNOSIS — M2550 Pain in unspecified joint: Secondary | ICD-10-CM | POA: Diagnosis not present

## 2024-06-07 MED ORDER — HYDROCODONE-ACETAMINOPHEN 7.5-325 MG PO TABS: 1.0000 | ORAL_TABLET | Freq: Three times a day (TID) | ORAL | 0 refills | Status: DC | PRN

## 2024-06-07 NOTE — Progress Notes (Signed)
 Subjective:    Patient ID: Brittany Crawford, female    DOB: Oct 10, 1956, 68 y.o.   MRN: [id number]  HPI: Brittany Crawford is a 68 y.o. female who returns for follow up appointment for chronic pain and medication refill. She states her pain is located in her lower back and generalized joint pain. She rates her pain 5. Her current exercise regime is walking and performing stretching exercises.  Ms. Filla Morphine equivalent is 22.50 MME.   Oral Swab was Performed today.     Pain Inventory Average Pain 7 Pain Right Now 5 My pain is aching  In the last 24 hours, has pain interfered with the following? General activity 5 Relation with others 5 Enjoyment of life 9 What TIME of day is your pain at its worst? evening Sleep (in general) Fair  Pain is worse with: bending and standing Pain improves with: rest, heat/ice, and medication Relief from Meds: 8  Family History  Problem Relation Age of Onset   Stroke Mother    Colon cancer Maternal Uncle    Breast cancer Neg Hx    Social History   Socioeconomic History   Marital status: Married    Spouse name: Not on file   Number of children: 2   Years of education: 12   Highest education level: Not on file  Occupational History   Occupation: disability  Tobacco Use   Smoking status: Former    Current packs/day: 0.00    Types: Cigarettes    Quit date: 03/13/2017    Years since quitting: 7.2    Passive exposure: Never   Smokeless tobacco: Never   Tobacco comments:    4  to 5  cigarettes daily for years  Vaping Use   Vaping status: Never Used  Substance and Sexual Activity   Alcohol  use: Not Currently    Comment: occassionally   Drug use: Yes    Types: Hydrocodone    Sexual activity: Not Currently  Other Topics Concern   Not on file  Social History Narrative   Patient is married with 2 children.   Patient is right handed.   Patient has hs education.   Patient drinks 4 cups daily.   Social Drivers of Manufacturing engineer Strain: Low Risk  (08/19/2023)   Overall Financial Resource Strain (CARDIA)    Difficulty of Paying Living Expenses: Not hard at all  Food Insecurity: No Food Insecurity (08/19/2023)   Hunger Vital Sign    Worried About Running Out of Food in the Last Year: Never true    Ran Out of Food in the Last Year: Never true  Transportation Needs: No Transportation Needs (08/19/2023)   PRAPARE - Administrator, Civil Service (Medical): No    Lack of Transportation (Non-Medical): No  Physical Activity: Insufficiently Active (08/19/2023)   Exercise Vital Sign    Days of Exercise per Week: 2 days    Minutes of Exercise per Session: 10 min  Stress: No Stress Concern Present (08/19/2023)   [employer] of Occupational Health - Occupational Stress Questionnaire    Feeling of Stress : Not at all  Social Connections: Moderately Integrated (08/19/2023)   Social Connection and Isolation Panel    Frequency of Communication with Friends and Family: Twice a week    Frequency of Social Gatherings with Friends and Family: Once a week    Attends Religious Services: More than 4 times per year    Active Member of Clubs or  Organizations: Not on file    Attends Club or Organization Meetings: Never    Marital Status: Married   Past Surgical History:  Procedure Laterality Date   BREAST BIOPSY Left 04/29/2016   BREAST BIOPSY Left 04/23/2016   COLONOSCOPY     DILATATION & CURRETTAGE/HYSTEROSCOPY WITH RESECTOCOPE N/A 12/10/2012   Procedure: DILATATION & CURETTAGE/HYSTEROSCOPY WITH RESECTOCOPE;  Surgeon: Dickie DELENA Carder, MD;  Location: [facility];  Service: Gynecology;  Laterality: N/A;   FOOT SURGERY     left-pins placed   LYMPH NODE BIOPSY     POLYPECTOMY N/A 12/10/2012   Procedure: POLYPECTOMY;  Surgeon: Dickie DELENA Carder, MD;  Location: [facility];  Service: Gynecology;  Laterality: N/A;   STRABISMUS SURGERY Left 04/17/2017   Procedure: REPAIR STRABISMUS LEFT EYE;  Surgeon:  Neysa Fallow, MD;  Location: [facility];  Service: Ophthalmology;  Laterality: Left;   Past Surgical History:  Procedure Laterality Date   BREAST BIOPSY Left 04/29/2016   BREAST BIOPSY Left 04/23/2016   COLONOSCOPY     DILATATION & CURRETTAGE/HYSTEROSCOPY WITH RESECTOCOPE N/A 12/10/2012   Procedure: DILATATION & CURETTAGE/HYSTEROSCOPY WITH RESECTOCOPE;  Surgeon: Dickie DELENA Carder, MD;  Location[facility];  Service: Gynecology;  Laterality: N/A;   FOOT SURGERY     left-pins placed   LYMPH NODE BIOPSY     POLYPECTOMY N/A 12/10/2012   Procedure: POLYPECTOMY;  Surgeon: Dickie DELENA Carder, MD;  Location: [facility];  Service: Gynecology;  Laterality: N/A;   STRABISMUS SURGERY Left 04/17/2017   Procedure: REPAIR STRABISMUS LEFT EYE;  Surgeon: Neysa Fallow, MD;  [facility];  Service: Ophthalmology;  Laterality: Left;   Past Medical History:  Diagnosis Date   Chronic back pain    H/O sarcoidosis    Hypertension    Seasonal allergies    Stroke (HCC)    BP 136/82   Pulse 85   Ht 5' 8 (1.727 m)   Wt 198 lb (89.8 kg)   LMP 11/13/2012   SpO2 94%   BMI 30.11 kg/m   Opioid Risk Score:   Fall Risk Score:  `1  Depression screen [facility] 2/9     08/19/2023    9:04 AM 08/11/2023   10:34 AM 02/05/2023    9:41 AM 01/22/2023    9:04 AM 12/11/2022    9:31 AM 11/04/2022    9:43 AM 10/14/2022   10:41 AM  Depression screen PHQ 2/9  Decreased Interest 0 0 0 0 0 0 0  Down, Depressed, Hopeless 0 0 0 0 0 0 0  PHQ - 2 Score 0 0 0 0 0 0 0  Altered sleeping 0        Tired, decreased energy 0        Change in appetite 0        Feeling bad or failure about yourself  0        Trouble concentrating 0        Moving slowly or fidgety/restless 0        Suicidal thoughts 0        PHQ-9 Score 0        Difficult doing work/chores Not difficult at all           Review of Systems  Musculoskeletal:  Positive for back pain.  All other systems reviewed and are  negative.      Objective:   Physical Exam Vitals and nursing note reviewed.  Constitutional:      Appearance: Normal appearance.  Cardiovascular:     Rate and Rhythm: Normal rate and regular rhythm.     Pulses: Normal pulses.     Heart sounds: Normal heart sounds.  Pulmonary:     Effort: Pulmonary effort is normal.     Breath sounds: Normal breath sounds.  Musculoskeletal:     Comments: Normal Muscle Bulk and Muscle Testing Reveals:  Upper Extremities: Full ROM and Muscle Strength 5/5  Lumbar Paraspinal Tenderness: L-4-L-5 Lower Extremities: Full ROM and Muscle Strength 5/5 Arises from Table slowly Narrow Based Gait     Skin:    General: Skin is warm and dry.  Neurological:     Mental Status: She is alert and oriented to person, place, and time.     Cranial Nerves: No cranial nerve deficit.  Psychiatric:        Mood and Affect: Mood normal.        Behavior: Behavior normal.          Assessment & Plan:  1. Lumbar spondylosis with DDD and facet arthropathy.Left Lumbar Radiculitis: Continue HEP as Tolerated. Continue to Monitor.  06/07/2024 Refilled: Hydrocodone  7.5 /325 mg one tablet every 8 hours as needed for pain.  #90 tablets. . Second script e-scribe for the following month. We will continue the opioid monitoring program, this consists of regular clinic visits, examinations, urine drug screen, pill counts as well as use of [place]  Controlled Substance Reporting system. A 12 month History has been reviewed on the [place]  Controlled Substance Reporting System on 06/07/2024. 2. Chronic Right  knee pain:  No complaints today. Continue with heat/ice, exercise and Diclofenac . 06/07/2024 3. Right thalamic/internal capsule lacunar infarct with persistent left hemisensory deficits: Neurology Following. Continue to Monitor. 06/07/2024. 4.Left Greater Trochanteric Bursitis: Continue HEP as Tolerated and Alternate Ice and Heat Therapy. No complaints today. Continue  current medication regime. Continue to Monitor.  06/07/2024. 5. Polyarthralgia: Continue HEP as Tolerated. Continue to monitor. 06/07/2024 6. Cervicalgia: No complaints Today. Continue to alternate with heat and Ice therapy. Continue to Monitor. 06/07/2024 7. Right Shoulder Pain:  No complaints today.    F/U in 2 months

## 2024-06-12 LAB — DRUG TOX MONITOR 1 W/CONF, ORAL FLD
Amphetamines: NEGATIVE ng/mL (ref <10)
Barbiturates: NEGATIVE ng/mL (ref <10)
Benzodiazepines: NEGATIVE ng/mL (ref <0.50)
Buprenorphine: NEGATIVE ng/mL (ref <0.10)
Cocaine: NEGATIVE ng/mL (ref <5.0)
Codeine: NEGATIVE ng/mL (ref <2.5)
Dihydrocodeine: 5.6 ng/mL — ABNORMAL HIGH (ref <2.5)
Fentanyl: NEGATIVE ng/mL (ref <0.10)
Heroin Metabolite: NEGATIVE ng/mL (ref <1.0)
Hydrocodone: 85.4 ng/mL — ABNORMAL HIGH (ref <2.5)
Hydromorphone: NEGATIVE ng/mL (ref <2.5)
MARIJUANA: NEGATIVE ng/mL (ref <2.5)
MDMA: NEGATIVE ng/mL (ref <10)
Meprobamate: NEGATIVE ng/mL (ref <2.5)
Methadone: NEGATIVE ng/mL (ref <5.0)
Morphine: NEGATIVE ng/mL (ref <2.5)
Nicotine Metabolite: NEGATIVE ng/mL (ref <5.0)
Norhydrocodone: NEGATIVE ng/mL (ref <2.5)
Noroxycodone: NEGATIVE ng/mL (ref <2.5)
Opiates: POSITIVE ng/mL — AB (ref <2.5)
Oxycodone: NEGATIVE ng/mL (ref <2.5)
Oxymorphone: NEGATIVE ng/mL (ref <2.5)
Phencyclidine: NEGATIVE ng/mL (ref <10)
Tapentadol: NEGATIVE ng/mL (ref <5.0)
Tramadol: NEGATIVE ng/mL (ref <5.0)
Zolpidem: NEGATIVE ng/mL (ref <5.0)

## 2024-06-12 LAB — DRUG TOX ALC METAB W/CON, ORAL FLD: Alcohol Metabolite: NEGATIVE ng/mL (ref <25)

## 2024-06-21 ENCOUNTER — Other Ambulatory Visit: Payer: Self-pay | Admitting: Nurse Practitioner

## 2024-06-21 DIAGNOSIS — E1122 Type 2 diabetes mellitus with diabetic chronic kidney disease: Secondary | ICD-10-CM

## 2024-06-21 DIAGNOSIS — E782 Mixed hyperlipidemia: Secondary | ICD-10-CM

## 2024-06-21 MED ORDER — PRAVASTATIN SODIUM 20 MG PO TABS: ORAL_TABLET | ORAL | 1 refills | Status: DC

## 2024-06-21 NOTE — Telephone Encounter (Signed)
 Copied from CRM [id number]. Topic: Clinical - Medication Refill >> Jun 21, 2024 10:49 AM Leonette P wrote: Medication: pravastatin  20 mg  Has the patient contacted their pharmacy? Yes  This is the patient's preferred pharmacy:  [facility] [phone] - [place], [place] - [address] AT [facility] [address] [place] [address] Phone: [phone] Fax: [phone]  Is this the correct pharmacy for this prescription? Yes If no, delete pharmacy and type the correct one.   Has the prescription been filled recently? Yes  Is the patient out of the medication? Yes  she said she is out and had called the pharmacy last week  Has the patient been seen for an appointment in the last year OR does the patient have an upcoming appointment? Yes  Can we respond through MyChart? Yes  Agent: Please be advised that Rx refills may take up to 3 business days. We ask that you follow-up with your pharmacy.

## 2024-06-22 ENCOUNTER — Other Ambulatory Visit: Payer: Self-pay

## 2024-06-22 DIAGNOSIS — E1122 Type 2 diabetes mellitus with diabetic chronic kidney disease: Secondary | ICD-10-CM

## 2024-06-22 DIAGNOSIS — E782 Mixed hyperlipidemia: Secondary | ICD-10-CM

## 2024-06-22 MED ORDER — PRAVASTATIN SODIUM 20 MG PO TABS: ORAL_TABLET | ORAL | 1 refills | Status: DC

## 2024-07-21 ENCOUNTER — Other Ambulatory Visit: Payer: Self-pay | Admitting: Nurse Practitioner

## 2024-08-09 ENCOUNTER — Encounter: Payer: Self-pay | Admitting: Registered Nurse

## 2024-08-09 ENCOUNTER — Encounter: Attending: Registered Nurse | Admitting: Registered Nurse

## 2024-08-09 VITALS — BP 124/70 | HR 79 | Ht 68.0 in | Wt 200.4 lb

## 2024-08-09 DIAGNOSIS — Z79891 Long term (current) use of opiate analgesic: Secondary | ICD-10-CM | POA: Insufficient documentation

## 2024-08-09 DIAGNOSIS — G894 Chronic pain syndrome: Secondary | ICD-10-CM | POA: Insufficient documentation

## 2024-08-09 DIAGNOSIS — M2550 Pain in unspecified joint: Secondary | ICD-10-CM | POA: Insufficient documentation

## 2024-08-09 DIAGNOSIS — M7062 Trochanteric bursitis, left hip: Secondary | ICD-10-CM | POA: Insufficient documentation

## 2024-08-09 DIAGNOSIS — M47816 Spondylosis without myelopathy or radiculopathy, lumbar region: Secondary | ICD-10-CM | POA: Insufficient documentation

## 2024-08-09 DIAGNOSIS — Z5181 Encounter for therapeutic drug level monitoring: Secondary | ICD-10-CM | POA: Insufficient documentation

## 2024-08-09 MED ORDER — HYDROCODONE-ACETAMINOPHEN 7.5-325 MG PO TABS: 1.0000 | ORAL_TABLET | Freq: Three times a day (TID) | ORAL | 0 refills | Status: DC | PRN

## 2024-08-09 NOTE — Progress Notes (Signed)
 Subjective:    Patient ID: Brittany Crawford, female    DOB: 1956-08-21, 68 y.o.   MRN: [id number]  HPI: Brittany Crawford is a 68 y.o. female who returns for follow up appointment for chronic pain and medication refill. She states her pain is located in her lower back and generalized joint pain. She rates her pain 8. Her current exercise regime is walking and performing stretching exercises.  Ms. [place]ka Morphine equivalent is 22.50 MME.   Last Oral Swab was Pergformed on 06/07/2024, it was consistent.      Pain Inventory Average Pain 8 Pain Right Now 8 My pain is intermittent, sharp, and aching  In the last 24 hours, has pain interfered with the following? General activity 7 Relation with others 8 Enjoyment of life 10 What TIME of day is your pain at its worst? morning  and evening Sleep (in general) NA  Pain is worse with: bending, sitting, and standing Pain improves with: rest, heat/ice, therapy/exercise, and medication Relief from Meds: 9  Family History  Problem Relation Age of Onset   Stroke Mother    Colon cancer Maternal Uncle    Breast cancer Neg Hx    Social History   Socioeconomic History   Marital status: Married    Spouse name: Not on file   Number of children: 2   Years of education: 12   Highest education level: Not on file  Occupational History   Occupation: disability  Tobacco Use   Smoking status: Former    Current packs/day: 0.00    Types: Cigarettes    Quit date: 03/13/2017    Years since quitting: 7.4    Passive exposure: Never   Smokeless tobacco: Never   Tobacco comments:    4  to 5  cigarettes daily for years  Vaping Use   Vaping status: Never Used  Substance and Sexual Activity   Alcohol  use: Not Currently    Comment: occassionally   Drug use: Yes    Types: Hydrocodone    Sexual activity: Not Currently  Other Topics Concern   Not on file  Social History Narrative   Patient is married with 2 children.   Patient is right handed.    Patient has hs education.   Patient drinks 4 cups daily.   Social Drivers of Corporate Investment Banker Strain: Low Risk  (08/19/2023)   Overall Financial Resource Strain (CARDIA)    Difficulty of Paying Living Expenses: Not hard at all  Food Insecurity: No Food Insecurity (08/19/2023)   Hunger Vital Sign    Worried About Running Out of Food in the Last Year: Never true    Ran Out of Food in the Last Year: Never true  Transportation Needs: No Transportation Needs (08/19/2023)   PRAPARE - Administrator, Civil Service (Medical): No    Lack of Transportation (Non-Medical): No  Physical Activity: Insufficiently Active (08/19/2023)   Exercise Vital Sign    Days of Exercise per Week: 2 days    Minutes of Exercise per Session: 10 min  Stress: No Stress Concern Present (08/19/2023)   [employer] of Occupational Health - Occupational Stress Questionnaire    Feeling of Stress : Not at all  Social Connections: Moderately Integrated (08/19/2023)   Social Connection and Isolation Panel    Frequency of Communication with Friends and Family: Twice a week    Frequency of Social Gatherings with Friends and Family: Once a week    Attends Religious Services:  More than 4 times per year    Active Member of Clubs or Organizations: Not on file    Attends Club or Organization Meetings: Never    Marital Status: Married   Past Surgical History:  Procedure Laterality Date   BREAST BIOPSY Left 04/29/2016   BREAST BIOPSY Left 04/23/2016   COLONOSCOPY     DILATATION & CURRETTAGE/HYSTEROSCOPY WITH RESECTOCOPE N/A 12/10/2012   Procedure: DILATATION & CURETTAGE/HYSTEROSCOPY WITH RESECTOCOPE;  Surgeon: Dickie DELENA Carder, MD;  Location: [facility];  Service: Gynecology;  Laterality: N/A;   FOOT SURGERY     left-pins placed   LYMPH NODE BIOPSY     POLYPECTOMY N/A 12/10/2012   Procedure: POLYPECTOMY;  Surgeon: Dickie DELENA Carder, MD;  Location: [facility];  Service: Gynecology;  Laterality: N/A;    STRABISMUS SURGERY Left 04/17/2017   Procedure: REPAIR STRABISMUS LEFT EYE;  Surgeon: Neysa Fallow, MD;  Location: [facility];  Service: Ophthalmology;  Laterality: Left;   Past Surgical History:  Procedure Laterality Date   BREAST BIOPSY Left 04/29/2016   BREAST BIOPSY Left 04/23/2016   COLONOSCOPY     DILATATION & CURRETTAGE/HYSTEROSCOPY WITH RESECTOCOPE N/A 12/10/2012   Procedure: DILATATION & CURETTAGE/HYSTEROSCOPY WITH RESECTOCOPE;  Surgeon: Dickie DELENA Carder, MD;  Location: [facility];  Service: Gynecology;  Laterality: N/A;   FOOT SURGERY     left-pins placed   LYMPH NODE BIOPSY     POLYPECTOMY N/A 12/10/2012   Procedure: POLYPECTOMY;  Surgeon: Dickie DELENA Carder, MD;  Location: [facility];  Service: Gynecology;  Laterality: N/A;   STRABISMUS SURGERY Left 04/17/2017   Procedure: REPAIR STRABISMUS LEFT EYE;  Surgeon: Neysa Fallow, MD;  Location: [facility];  Service: Ophthalmology;  Laterality: Left;   Past Medical History:  Diagnosis Date   Chronic back pain    H/O sarcoidosis    Hypertension    Seasonal allergies    Stroke (HCC)    BP 124/70   Pulse 79   Ht 5' 8 (1.727 m)   Wt 200 lb 6.4 oz (90.9 kg)   LMP 11/13/2012   SpO2 97%   BMI 30.47 kg/m   Opioid Risk Score:   Fall Risk Score:  `1  Depression screen [facility] 2/9     08/09/2024    9:58 AM 08/19/2023    9:04 AM 08/11/2023   10:34 AM 02/05/2023    9:41 AM 01/22/2023    9:04 AM 12/11/2022    9:31 AM 11/04/2022    9:43 AM  Depression screen PHQ 2/9  Decreased Interest 0 0 0 0 0 0 0  Down, Depressed, Hopeless 0 0 0 0 0 0 0  PHQ - 2 Score 0 0 0 0 0 0 0  Altered sleeping  0       Tired, decreased energy  0       Change in appetite  0       Feeling bad or failure about yourself   0       Trouble concentrating  0       Moving slowly or fidgety/restless  0       Suicidal thoughts  0       PHQ-9 Score  0       Difficult doing work/chores  Not difficult at all         Review of  Systems  Musculoskeletal:  Positive for back pain.  All other systems reviewed and are negative.      Objective:   Physical  Exam Vitals and nursing note reviewed.  Constitutional:      Appearance: Normal appearance.  Cardiovascular:     Rate and Rhythm: Normal rate.     Pulses: Normal pulses.     Heart sounds: Normal heart sounds.  Pulmonary:     Effort: Pulmonary effort is normal.     Breath sounds: Normal breath sounds.  Musculoskeletal:     Comments: Normal Muscle Bulk and Muscle Testing Reveals:  Upper Extremities: Full ROM and Muscle Strength 5/5 Lumbar Paraspinal Tenderness: L-4-L-5 Lower Extremities: Full ROM and Muscle Strength 5/5 Bilateral Lower Extremities Flexion Produces Pain into her Bilateral Patella's Arises from Table slowly  Narrow Based  Gait     Skin:    General: Skin is warm and dry.  Neurological:     Mental Status: She is alert and oriented to person, place, and time.  Psychiatric:        Mood and Affect: Mood normal.        Behavior: Behavior normal.          Assessment & Plan:  1. Lumbar spondylosis with DDD and facet arthropathy.Left Lumbar Radiculitis: Continue HEP as Tolerated. Continue to Monitor.  08/09/2024 Refilled: Hydrocodone  7.5 /325 mg one tablet every 8 hours as needed for pain.  #90 tablets. . Second script e-scribe for the following month. We will continue the opioid monitoring program, this consists of regular clinic visits, examinations, urine drug screen, pill counts as well as use of [place]  Controlled Substance Reporting system. A 12 month History has been reviewed on the   Controlled Substance Reporting System on 08/09/2024. 2. Chronic Right  knee pain:  No complaints today. Continue with heat/ice, exercise and Diclofenac . 08/09/2024 3. Right thalamic/internal capsule lacunar infarct with persistent left hemisensory deficits: Neurology Following. Continue to Monitor. 08/09/2024. 4.Left Greater Trochanteric  Bursitis: Continue HEP as Tolerated and Alternate Ice and Heat Therapy. No complaints today. Continue current medication regime. Continue to Monitor.  08/09/2024. 5. Polyarthralgia: Continue HEP as Tolerated. Continue to monitor. 08/09/2024 6. Cervicalgia: No complaints Today. Continue to alternate with heat and Ice therapy. Continue to Monitor. 08/09/2024 7. Right Shoulder Pain:  No complaints today. 08/09/2024   F/U in 2 months

## 2024-08-10 NOTE — Progress Notes (Signed)
 Brittany Crawford                                          MRN: [id number]   08/10/2024   The [facility] Quality Team Specialist reviewed this patient medical record for the purposes of chart review for care gap closure. The following were reviewed: chart review for care gap closure-kidney health evaluation for diabetes:eGFR  and uACR.    VBCI Quality Team

## 2024-08-13 ENCOUNTER — Emergency Department (HOSPITAL_COMMUNITY)
Admission: EM | Admit: 2024-08-13 | Discharge: 2024-08-13 | Disposition: A | Discharge status: Home / Self Care | Attending: Emergency Medicine | Admitting: Emergency Medicine

## 2024-08-13 ENCOUNTER — Other Ambulatory Visit: Payer: Self-pay

## 2024-08-13 ENCOUNTER — Encounter (HOSPITAL_COMMUNITY): Payer: Self-pay

## 2024-08-13 DIAGNOSIS — J029 Acute pharyngitis, unspecified: Secondary | ICD-10-CM | POA: Diagnosis present

## 2024-08-13 MED ORDER — PREDNISONE 20 MG PO TABS
20.0000 mg | ORAL_TABLET | Freq: Once | ORAL | Status: AC
  Administered 2024-08-13: 20 mg via ORAL
  Filled 2024-08-13: qty 1

## 2024-08-13 MED ORDER — PREDNISONE 10 MG PO TABS: 20.0000 mg | ORAL_TABLET | Freq: Every day | ORAL | 0 refills | Status: AC

## 2024-08-13 NOTE — ED Provider Notes (Signed)
 [facility] AT [facility] Provider Note   CSN: [id number] Arrival date & time: 08/13/24  1607     Patient presents with: Popcorn Scratched Throat   Brittany Crawford is a 68 y.o. female.   Patient here with throat irritation after eating popcorn a few days ago.  She feels like the popcorn kernel may be scratched her left side of the throat.  Has not been having any difficulty eating or drinking.  She denies any drooling.  She is is having pain at times.  Denies any weakness numbness tingling.  No facial swelling.  No change in her voice.  She is a diabetic but controlled on oral meds.  Denies any fever or chills.  Not the makes it worse or better.  Seems that it started after eating popcorn with popcorn kernel.  The history is provided by the patient.       Prior to Admission medications   Medication Sig Start Date End Date Taking? Authorizing Provider  predniSONE  (DELTASONE ) 10 MG tablet Take 2 tablets (20 mg total) by mouth daily for 4 days. 08/13/24 08/17/24 Yes Ambar Raphael, DO  Ascorbic Acid (VITAMIN C) 1000 MG tablet Take 1,000 mg by mouth daily. Takes when coming down with a cold.    [provider]  azelastine  (OPTIVAR ) 0.05 % ophthalmic solution SMARTSIG:1 Drop(s) In Eye(s) Twice Daily PRN 09/28/20   [provider]  bisacodyl  5 MG EC tablet Take 1 tablet (5 mg total) by mouth daily as needed for moderate constipation. 06/10/23   Debby Fidela CROME, NP  Blood Glucose Monitoring Suppl (ACCU-CHEK GUIDE) w/Device KIT TEST UPTO 4 TIMES DAILY AS DIRECTED 06/10/23   Georgina Speaks, FNP  diclofenac  sodium (VOLTAREN ) 1 % GEL Apply 2 g topically 4 (four) times daily. 08/16/18   Babs Arthea DASEN, MD  doxycycline  (VIBRA -TABS) 100 MG tablet Take 1 tablet (100 mg total) by mouth 2 (two) times daily. 10/15/23   Georgina Speaks, FNP  FARXIGA  10 MG TABS tablet TAKE 1 TABLET(10 MG) BY MOUTH DAILY BEFORE BREAKFAST 04/18/24   Moore, Janece, FNP  fluconazole   (DIFLUCAN ) 100 MG tablet Take 1 tablet (100 mg total) by mouth daily. Take 1 tablet by mouth now repeat in 5 days 10/15/23   Moore, Janece, FNP  HYDROcodone -acetaminophen  (NORCO) 7.5-325 MG tablet Take 1 tablet by mouth every 8 (eight) hours as needed. 08/09/24   Debby Fidela CROME, NP  latanoprost (XALATAN) 0.005 % ophthalmic solution 1 drop at bedtime. 12/24/21   [provider]  losartan -hydrochlorothiazide  (HYZAAR) 100-12.5 MG tablet TAKE 1 TABLET BY MOUTH DAILY 07/21/24   Moore, Janece, FNP  Multiple Vitamin (MULTIVITAMIN WITH MINERALS) TABS tablet Take 1 tablet by mouth daily.    [provider]  Olopatadine HCl 0.2 % SOLN Apply 1 drop to eye daily as needed for allergies. 06/18/17   [provider]  pravastatin  (PRAVACHOL ) 20 MG tablet TAKE 1 TABLET BY MOUTH EVERY EVENING. ( 20MG ). 06/22/24   Georgina Speaks, FNP  prednisoLONE acetate (PRED FORTE) 1 % ophthalmic suspension Place 1 drop into the left eye 4 (four) times daily. 12/16/22   [provider]  triamcinolone  cream (KENALOG ) 0.1 % Apply 1 Application topically at bedtime as needed (for rash). 08/11/22   Georgina Speaks, FNP    Allergies: Gadobenate and Ivp dye [iodinated contrast media]    Review of Systems  Updated Vital Signs BP 137/87 (BP Location: Left Arm)   Pulse 71   Temp 98.3 F (36.8  C)   Resp 18   Ht 5' 8 (1.727 m)   Wt 89.8 kg   LMP 11/13/2012   SpO2 96%   BMI 30.11 kg/m   Physical Exam Vitals and nursing note reviewed.  Constitutional:      General: She is not in acute distress.    Appearance: She is well-developed. She is not ill-appearing.  HENT:     Head: Normocephalic and atraumatic.     Nose: Nose normal. No congestion.     Mouth/Throat:     Mouth: Mucous membranes are moist.     Pharynx: No oropharyngeal exudate or posterior oropharyngeal erythema.     Comments: No trismus no drooling normal voice no submandibular swelling, no signs of infection of the throat no exudates  uvula is midline Eyes:     Extraocular Movements: Extraocular movements intact.     Conjunctiva/sclera: Conjunctivae normal.     Pupils: Pupils are equal, round, and reactive to light.  Cardiovascular:     Rate and Rhythm: Normal rate and regular rhythm.     Pulses: Normal pulses.     Heart sounds: Normal heart sounds. No murmur heard. Pulmonary:     Effort: Pulmonary effort is normal. No respiratory distress.     Breath sounds: Normal breath sounds.  Abdominal:     Palpations: Abdomen is soft.     Tenderness: There is no abdominal tenderness.  Musculoskeletal:        General: No swelling.     Cervical back: Normal range of motion and neck supple.  Skin:    General: Skin is warm and dry.     Capillary Refill: Capillary refill takes less than 2 seconds.  Neurological:     Mental Status: She is alert.  Psychiatric:        Mood and Affect: Mood normal.     (all labs ordered are listed, but only abnormal results are displayed) Labs Reviewed - No data to display  EKG: None  Radiology: No results found.   Procedures   Medications Ordered in the ED  predniSONE  (DELTASONE ) tablet 20 mg (20 mg Oral Given 08/13/24 1652)                                    Medical Decision Making Risk Prescription drug management.   Brittany Crawford is here with sore throat after eating popcorn kernel that she thinks might of scratched her throat.  She has no trismus no drooling no signs of infection on exam.  There is no lip swelling tongue swelling submandibular swelling.  There is no exudates.  I do not think that this is an infectious process or viral process.  I think she likely scratched her posterior oropharynx with popcorn kernel.  She is not having any drooling or trismus.  She is able to open her mouth fine.  There is no change in her voice.  Overall we will give her supportive care with prednisone  and have her follow-up with ENT if things are not getting better.  She understands  return precautions.  Discharge.  This chart was dictated using voice recognition software.  Despite best efforts to proofread,  errors can occur which can change the documentation meaning.      Final diagnoses:  Sore throat    ED Discharge Orders          Ordered    predniSONE  (DELTASONE ) 10 MG tablet  Daily        08/13/24 1703               Ruthe Cornet, DO 08/13/24 1705

## 2024-08-13 NOTE — ED Triage Notes (Signed)
 Pt came in via POV d/t a few days ago eating popcorn & when pouring the last of the bag in her mouth states it feels like something scratched her throat & it feels very irritating almost like something is stuck back there. A/Ox4, rates her pain 8/10 during triage. No distress noted controlling saliva, no difficulty breathing, denies chest discomfort.

## 2024-08-13 NOTE — Discharge Instructions (Signed)
 Take next dose of prednisone  tomorrow.  Please return if symptoms worsen as we discussed.  If you have any drooling difficulty opening mouth difficulty swallowing that is severe please return for evaluation.  Otherwise follow-up with your nose and throat doctor.

## 2024-08-24 ENCOUNTER — Ambulatory Visit (INDEPENDENT_AMBULATORY_CARE_PROVIDER_SITE_OTHER)

## 2024-08-24 ENCOUNTER — Encounter (INDEPENDENT_AMBULATORY_CARE_PROVIDER_SITE_OTHER): Payer: Self-pay

## 2024-08-24 VITALS — BP 127/78 | HR 80 | Temp 99.0°F | Ht 68.0 in | Wt 198.0 lb

## 2024-08-24 DIAGNOSIS — J029 Acute pharyngitis, unspecified: Secondary | ICD-10-CM

## 2024-08-24 DIAGNOSIS — H608X3 Other otitis externa, bilateral: Secondary | ICD-10-CM

## 2024-08-24 MED ORDER — FLUOCINOLONE ACETONIDE 0.01 % EX CREA: TOPICAL_CREAM | Freq: Two times a day (BID) | CUTANEOUS | 0 refills | Status: AC

## 2024-08-24 NOTE — Progress Notes (Signed)
 Dear Dr. Georgina, Here is my assessment for our mutual patient, Palmina Clodfelter. Thank you for allowing me the opportunity to care for your patient. Please do not hesitate to contact me should you have any other questions. Sincerely, Dr. Penne Croak  Otolaryngology Clinic Note Referring provider: Dr. Georgina HPI:  Discussed the use of AI scribe software for clinical note transcription with the patient, who gave verbal consent to proceed.  History of Present Illness Brittany Crawford is a 68 year old female with sarcoidosis who presents with throat discomfort after eating popcorn.  Oropharyngeal discomfort - Throat discomfort began after consuming popcorn - Sensation described as roughness and persistent feeling of something 'wrong' in the left side of the throat - No throat pain, but tenderness present - Gargling with warm water provides temporary relief - Drinking cold liquids is soothing  Submandibular swelling and halitosis - Sensation of swelling in the left submandibular gland area - Area is tender but not painful - Associated with bad breath  Response to treatment - Completed a five-day course of prednisone , which initially alleviated symptoms - Symptoms recurred two days after finishing prednisone   Otolaryngologic symptoms - Ears are dry and itchy  Relevant medical history - Sarcoidosis diagnosed in the 1980s, occasionally causes skin blotches - No tobacco or alcohol  use    Independent Review of Additional Tests or Records:  Reviewed external note from referring PCP, Moore,describing relevant history incorporated into today's evaluation.   PMH/Meds/All/SocHx/FamHx/ROS:   Past Medical History:  Diagnosis Date   Chronic back pain    H/O sarcoidosis    Hypertension    Seasonal allergies    Stroke [facility])      Past Surgical History:  Procedure Laterality Date   BREAST BIOPSY Left 04/29/2016   BREAST BIOPSY Left 04/23/2016   COLONOSCOPY     DILATATION &  CURRETTAGE/HYSTEROSCOPY WITH RESECTOCOPE N/A 12/10/2012   Procedure: DILATATION & CURETTAGE/HYSTEROSCOPY WITH RESECTOCOPE;  Surgeon: Dickie DELENA Carder, MD;  Location: [facility];  Service: Gynecology;  Laterality: N/A;   FOOT SURGERY     left-pins placed   LYMPH NODE BIOPSY     POLYPECTOMY N/A 12/10/2012   Procedure: POLYPECTOMY;  Surgeon: Dickie DELENA Carder, MD;  Location: [facility];  Service: Gynecology;  Laterality: N/A;   STRABISMUS SURGERY Left 04/17/2017   Procedure: REPAIR STRABISMUS LEFT EYE;  Surgeon: Neysa Fallow, MD;  Location: [facility];  Service: Ophthalmology;  Laterality: Left;    Family History  Problem Relation Age of Onset   Stroke Mother    Colon cancer Maternal Uncle    Breast cancer Neg Hx      Social Connections: Moderately Integrated (08/19/2023)   Social Connection and Isolation Panel    Frequency of Communication with Friends and Family: Twice a week    Frequency of Social Gatherings with Friends and Family: Once a week    Attends Religious Services: More than 4 times per year    Active Member of [employer] or Organizations: Not on file    Attends Banker Meetings: Never    Marital Status: Married      Current Outpatient Medications:    Ascorbic Acid (VITAMIN C) 1000 MG tablet, Take 1,000 mg by mouth daily. Takes when coming down with a cold., Disp: , Rfl:    azelastine  (OPTIVAR ) 0.05 % ophthalmic solution, SMARTSIG:1 Drop(s) In Eye(s) Twice Daily PRN, Disp: , Rfl:    bisacodyl  5 MG EC tablet, Take 1 tablet (5 mg total) by mouth daily  as needed for moderate constipation., Disp: 30 tablet, Rfl: 1   Blood Glucose Monitoring Suppl (ACCU-CHEK GUIDE) w/Device KIT, TEST UPTO 4 TIMES DAILY AS DIRECTED, Disp: 1 kit, Rfl: 1   diclofenac  sodium (VOLTAREN ) 1 % GEL, Apply 2 g topically 4 (four) times daily., Disp: 300 g, Rfl: 2   FARXIGA  10 MG TABS tablet, TAKE 1 TABLET(10 MG) BY MOUTH DAILY BEFORE BREAKFAST, Disp: 90 tablet, Rfl: 2    fluconazole  (DIFLUCAN ) 100 MG tablet, Take 1 tablet (100 mg total) by mouth daily. Take 1 tablet by mouth now repeat in 5 days, Disp: 2 tablet, Rfl: 0   fluocinolone 0.01 % cream, Apply topically 2 (two) times daily. Small amount inside ears for one week, then PRN ear itch, Disp: 30 g, Rfl: 0   HYDROcodone -acetaminophen  (NORCO) 7.5-325 MG tablet, Take 1 tablet by mouth every 8 (eight) hours as needed., Disp: 90 tablet, Rfl: 0   latanoprost (XALATAN) 0.005 % ophthalmic solution, 1 drop at bedtime., Disp: , Rfl:    losartan -hydrochlorothiazide  (HYZAAR) 100-12.5 MG tablet, TAKE 1 TABLET BY MOUTH DAILY, Disp: 90 tablet, Rfl: 1   Multiple Vitamin (MULTIVITAMIN WITH MINERALS) TABS tablet, Take 1 tablet by mouth daily., Disp: , Rfl:    Olopatadine HCl 0.2 % SOLN, Apply 1 drop to eye daily as needed for allergies., Disp: , Rfl: 3   pravastatin  (PRAVACHOL ) 20 MG tablet, TAKE 1 TABLET BY MOUTH EVERY EVENING. ( 20MG )., Disp: 30 tablet, Rfl: 1   prednisoLONE acetate (PRED FORTE) 1 % ophthalmic suspension, Place 1 drop into the left eye 4 (four) times daily., Disp: , Rfl:    triamcinolone  cream (KENALOG ) 0.1 %, Apply 1 Application topically at bedtime as needed (for rash)., Disp: 30 g, Rfl: 1   doxycycline  (VIBRA -TABS) 100 MG tablet, Take 1 tablet (100 mg total) by mouth 2 (two) times daily. (Patient not taking: Reported on 08/24/2024), Disp: 20 tablet, Rfl: 0   Physical Exam:   BP 127/78 (BP Location: Left Arm, Patient Position: Sitting, Cuff Size: Normal)   Pulse 80   Temp 99 F (37.2 C) (Oral)   Ht 5' 8 (1.727 m)   Wt 198 lb (89.8 kg)   LMP 11/13/2012   SpO2 94%   BMI 30.11 kg/m   The patient was awake, alert, and appropriate. The external ears were inspected, and otoscopy was performed to evaluate the external auditory canals and tympanic membranes. The nasal cavity and septum were examined for mucosal changes, obstruction, or discharge. The oral cavity and oropharynx were inspected for mucosal  lesions, infection, or tonsillar hypertrophy. The neck was palpated for lymphadenopathy, thyroid  abnormalities, or other masses. Cranial nerve function was grossly intact.  Pertinent Findings: Physical Exam HEENT: Normal oropharynx, moist mucous membranes. Ears dry with mild eczema of the ear canal. Nasal passages normal. Turbinates 3+, DNS left    Seprately Identifiable Procedures:  I personally ordered, reviewed and interpreted the following with the patient today  Procedure Note Pre-procedure diagnosis:  sore throat, trauma Post-procedure diagnosis: Same Procedure: Transnasal Fiberoptic Laryngoscopy, CPT 31575 - Mod 25 Indication: sore throat after eating sharp popcorn kernel Complications: None apparent EBL: 0 mL  The procedure was undertaken to further evaluate the patient's complaint of sore throat, with mirror exam inadequate for appropriate examination due to gag reflex and poor patient tolerance  Procedure:  Patient was identified as correct patient. Verbal consent was obtained. The nose was sprayed with oxymetazoline and 4% lidocaine . The The flexible laryngoscope was passed through the nose to  view the nasal cavity, pharynx (oropharynx, hypopharynx) and larynx.  The larynx was examined at rest and during multiple phonatory tasks. Documentation was obtained and reviewed with patient. The scope was removed. The patient tolerated the procedure well.  Findings: The nasal cavity and nasopharynx did not reveal any masses or lesions, mucosa appeared to be without obvious lesions. The tongue base, pharyngeal walls, piriform sinuses, vallecula, epiglottis and postcricoid region are normal in appearance EXCEPT: none. The visualized portion of the subglottis and proximal trachea is widely patent. The vocal folds are mobile bilaterally. There are no lesions on the free edge of the vocal folds nor elsewhere in the larynx worrisome for malignancy.    Electronically signed by: Penne Croak,  DO 08/24/2024 9:00 AM   Impression & Plans:  Courteny Egler is a 68 y.o. female  1. Chronic eczematous otitis externa of both ears   2. Acute sore throat     - Findings and diagnoses discussed in detail with the patient. - Risks, benefits, and alternatives were reviewed. Through shared decision making, the patient elects to proceed with below. Assessment & Plan Left pharyngeal irritation after popcorn ingestion Left pharyngeal irritation likely due to scratch or irritation from popcorn kernel. No foreign body detected. Healing expected within a week. - Advised gargling with warm water. - Instructed to change toothbrush to prevent reinfection. - If symptoms persist beyond one week, return for re-evaluation.  Eczema of ear canal Chronic ear canal eczema with intermittent symptoms. - Prescribed topical cream for ear canal eczema. - Advised application of cream during flare-ups. - Recommended avoiding Q-tip use and applying lotion to the outer ear.  - Orders placed: No orders of the defined types were placed in this encounter.  - Medications prescribed/continued/adjusted:  Meds ordered this encounter  Medications   fluocinolone 0.01 % cream    Sig: Apply topically 2 (two) times daily. Small amount inside ears for one week, then PRN ear itch    Dispense:  30 g    Refill:  0   - Education materials provided to the patient. - Follow up: one week if symptoms persist. Patient instructed to return sooner or go to the ED if new/worsening symptoms develop.   Thank you for allowing me the opportunity to care for your patient. Please do not hesitate to contact me should you have any other questions.  Sincerely, Penne Croak, DO Otolaryngologist (ENT) [facility] Phone: [phone] Fax: [phone]  08/24/2024, 9:00 AM

## 2024-09-02 ENCOUNTER — Ambulatory Visit (INDEPENDENT_AMBULATORY_CARE_PROVIDER_SITE_OTHER): Payer: Self-pay

## 2024-09-02 VITALS — BP 112/70 | HR 72 | Temp 98.3°F | Ht 67.0 in | Wt 204.0 lb

## 2024-09-02 DIAGNOSIS — N182 Chronic kidney disease, stage 2 (mild): Secondary | ICD-10-CM | POA: Diagnosis not present

## 2024-09-02 DIAGNOSIS — I129 Hypertensive chronic kidney disease with stage 1 through stage 4 chronic kidney disease, or unspecified chronic kidney disease: Secondary | ICD-10-CM

## 2024-09-02 DIAGNOSIS — Z0000 Encounter for general adult medical examination without abnormal findings: Secondary | ICD-10-CM

## 2024-09-02 DIAGNOSIS — E1122 Type 2 diabetes mellitus with diabetic chronic kidney disease: Secondary | ICD-10-CM | POA: Diagnosis not present

## 2024-09-02 DIAGNOSIS — Z23 Encounter for immunization: Secondary | ICD-10-CM

## 2024-09-02 NOTE — Progress Notes (Signed)
 Chief Complaint  Patient presents with   Annual Exam     Subjective:   Brittany Crawford is a 68 y.o. female who presents for a Medicare Annual Wellness Visit.  Allergies (verified) Gadobenate and Ivp dye [iodinated contrast media]   History: Past Medical History:  Diagnosis Date   Chronic back pain    H/O sarcoidosis    Hypertension    Seasonal allergies    Stroke [facility])    Past Surgical History:  Procedure Laterality Date   BREAST BIOPSY Left 04/29/2016   BREAST BIOPSY Left 04/23/2016   COLONOSCOPY     DILATATION & CURRETTAGE/HYSTEROSCOPY WITH RESECTOCOPE N/A 12/10/2012   Procedure: DILATATION & CURETTAGE/HYSTEROSCOPY WITH RESECTOCOPE;  Surgeon: Dickie DELENA Carder, MD;  Location: [facility];  Service: Gynecology;  Laterality: N/A;   FOOT SURGERY     left-pins placed   LYMPH NODE BIOPSY     POLYPECTOMY N/A 12/10/2012   Procedure: POLYPECTOMY;  Surgeon: Dickie DELENA Carder, MD;  Location: [facility];  Service: Gynecology;  Laterality: N/A;   STRABISMUS SURGERY Left 04/17/2017   Procedure: REPAIR STRABISMUS LEFT EYE;  Surgeon: Neysa Fallow, MD;  Location:  [facility];  Service: Ophthalmology;  Laterality: Left;   Family History  Problem Relation Age of Onset   Stroke Mother    Colon cancer Maternal Uncle    Breast cancer Neg Hx    Social History   Occupational History   Occupation: disability  Tobacco Use   Smoking status: Former    Current packs/day: 0.00    Types: Cigarettes    Quit date: 03/13/2017    Years since quitting: 7.4    Passive exposure: Never   Smokeless tobacco: Never   Tobacco comments:    4  to 5  cigarettes daily for years  Vaping Use   Vaping status: Never Used  Substance and Sexual Activity   Alcohol  use: Not Currently    Comment: occassionally   Drug use: Yes    Types: Hydrocodone    Sexual activity: Not Currently   Tobacco Counseling Counseling given: Yes Tobacco comments: 4  to 5  cigarettes daily for years  SDOH  Screenings   Food Insecurity: No Food Insecurity (09/02/2024)  Housing: Unknown (09/02/2024)  Transportation Needs: No Transportation Needs (09/02/2024)  Utilities: Not At Risk (09/02/2024)  Alcohol  Screen: Low Risk  (08/19/2023)  Depression (PHQ2-9): Low Risk  (09/02/2024)  Financial Resource Strain: Low Risk  (08/19/2023)  Physical Activity: Insufficiently Active (09/02/2024)  Social Connections: Moderately Integrated (09/02/2024)  Stress: No Stress Concern Present (09/02/2024)  Tobacco Use: Medium Risk (09/02/2024)  Health Literacy: Adequate Health Literacy (09/02/2024)   See flowsheets for full screening details  Depression Screen PHQ 2 & 9 Depression Scale- Over the past 2 weeks, how often have you been bothered by any of the following problems? Little interest or pleasure in doing things: 0 Feeling down, depressed, or hopeless (PHQ Adolescent also includes...irritable): 0 PHQ-2 Total Score: 0     Goals Addressed               This Visit's Progress     Take off a few pounds (pt-stated)        Try to get more active than what she is. Exercising.       Visit info / Clinical Intake: Medicare Wellness Visit Type:: Subsequent Annual Wellness Visit Persons participating in visit:: patient Medicare Wellness Visit Mode:: In-person (required for WTM) Information given by:: patient Interpreter Needed?: No Pre-visit prep was completed:  yes AWV questionnaire completed by patient prior to visit?: no Living arrangements:: lives with spouse/significant other Patient's Overall Health Status Rating: good Typical amount of pain: none Does pain affect daily life?: no Are you currently prescribed opioids?: (!) yes  Dietary Habits and Nutritional Risks How many meals a day?: 3 Eats fruit and vegetables daily?: yes Most meals are obtained by: preparing own meals In the last 2 weeks, have you had any of the following?: none Diabetic:: (!) yes Any non-healing wounds?: no How  often do you check your BS?: 0 Would you like to be referred to a Nutritionist or for Diabetic Management? : no  Functional Status Activities of Daily Living (to include ambulation/medication): Independent Ambulation: Independent Medication Administration: Independent Home Management: Independent Manage your own finances?: yes Primary transportation is: family/friends Concerns about vision?: no *vision screening is required for WTM* Concerns about hearing?: no  Fall Screening Falls in the past year?: 0 Number of falls in past year: 0 Was there an injury with Fall?: 0 Fall Risk Category Calculator: 0 Patient Fall Risk Level: Low Fall Risk  Fall Risk Patient at Risk for Falls Due to: No Fall Risks Fall risk Follow up: Falls evaluation completed  Home and Transportation Safety: All rugs have non-skid backing?: (!) no All stairs or steps have railings?: yes Grab bars in the bathtub or shower?: yes Have non-skid surface in bathtub or shower?: yes Good home lighting?: yes Regular seat belt use?: yes Hospital stays in the last year:: no  Cognitive Assessment Difficulty concentrating, remembering, or making decisions? : no Will 6CIT or Mini Cog be Completed: yes What year is it?: 0 points Give patient an address phrase to remember (5 components): [address] [place] About what time is it?: 0 points Count backwards from 20 to 1: 0 points Say the months of the year in reverse: 0 points Repeat the address phrase from earlier: 0 points  Advance Directives (For Healthcare) Does Patient Have a Medical Advance Directive?: Yes Does patient want to make changes to medical advance directive?: No - Patient declined  Reviewed/Updated  Reviewed/Updated: Reviewed All (Medical, Surgical, Family, Medications, Allergies, Care Teams, Patient Goals)        Objective:    Today's Vitals   09/02/24 0857  BP: 112/70  Pulse: 72  Temp: 98.3 F (36.8 C)  SpO2: 98%  Weight: 204 lb  (92.5 kg)  Height: 5' 7 (1.702 m)   Body mass index is 31.95 kg/m.  Current Medications (verified) Outpatient Encounter Medications as of 09/02/2024  Medication Sig   Ascorbic Acid (VITAMIN C) 1000 MG tablet Take 1,000 mg by mouth daily. Takes when coming down with a cold.   azelastine  (OPTIVAR ) 0.05 % ophthalmic solution SMARTSIG:1 Drop(s) In Eye(s) Twice Daily PRN   bisacodyl  5 MG EC tablet Take 1 tablet (5 mg total) by mouth daily as needed for moderate constipation.   Blood Glucose Monitoring Suppl (ACCU-CHEK GUIDE) w/Device KIT TEST UPTO 4 TIMES DAILY AS DIRECTED   diclofenac  sodium (VOLTAREN ) 1 % GEL Apply 2 g topically 4 (four) times daily.   FARXIGA  10 MG TABS tablet TAKE 1 TABLET(10 MG) BY MOUTH DAILY BEFORE BREAKFAST   fluconazole  (DIFLUCAN ) 100 MG tablet Take 1 tablet (100 mg total) by mouth daily. Take 1 tablet by mouth now repeat in 5 days   fluocinolone  0.01 % cream Apply topically 2 (two) times daily. Small amount inside ears for one week, then PRN ear itch   HYDROcodone -acetaminophen  (NORCO) 7.5-325 MG  tablet Take 1 tablet by mouth every 8 (eight) hours as needed.   latanoprost (XALATAN) 0.005 % ophthalmic solution 1 drop at bedtime.   losartan -hydrochlorothiazide  (HYZAAR) 100-12.5 MG tablet TAKE 1 TABLET BY MOUTH DAILY   Multiple Vitamin (MULTIVITAMIN WITH MINERALS) TABS tablet Take 1 tablet by mouth daily.   Olopatadine HCl 0.2 % SOLN Apply 1 drop to eye daily as needed for allergies.   pravastatin  (PRAVACHOL ) 20 MG tablet TAKE 1 TABLET BY MOUTH EVERY EVENING. ( 20MG ).   prednisoLONE acetate (PRED FORTE) 1 % ophthalmic suspension Place 1 drop into the left eye 4 (four) times daily.   triamcinolone  cream (KENALOG ) 0.1 % Apply 1 Application topically at bedtime as needed (for rash).   doxycycline  (VIBRA -TABS) 100 MG tablet Take 1 tablet (100 mg total) by mouth 2 (two) times daily. (Patient not taking: Reported on 09/02/2024)   No facility-administered encounter medications  on file as of 09/02/2024.   Hearing/Vision screen No results found. Immunizations and Health Maintenance Health Maintenance  Topic Date Due   COVID-19 Vaccine (3 - Moderna risk series) 02/27/2020   Bone Density Scan  Never done   HEMOGLOBIN A1C  02/16/2024   FOOT EXAM  04/29/2024   Diabetic kidney evaluation - eGFR measurement  08/18/2024   Zoster Vaccines- Shingrix (2 of 2) 12/03/2024 (Originally 05/01/2020)   Influenza Vaccine  01/10/2025 (Originally 05/13/2024)   Pneumococcal Vaccine: 50+ Years (2 of 2 - PCV) 09/02/2025 (Originally 07/09/2015)   Diabetic kidney evaluation - Urine ACR  10/14/2024   OPHTHALMOLOGY EXAM  03/21/2025   Medicare Annual Wellness (AWV)  09/02/2025   Mammogram  11/18/2025   Cervical Cancer Screening (HPV/Pap Cotest)  03/05/2027   DTaP/Tdap/Td (2 - Td or Tdap) 03/04/2032   Colonoscopy  10/17/2032   Hepatitis C Screening  Completed   HIV Screening  Completed   Hepatitis B Vaccines 19-59 Average Risk  Aged Out   Meningococcal B Vaccine  Aged Out        Assessment/Plan:  This is a routine wellness examination for Brittany Crawford.  Patient Care Team: Georgina Speaks, FNP as PCP - General (General Practice)  I have personally reviewed and noted the following in the patient's chart:   Medical and social history Use of alcohol , tobacco or illicit drugs  Current medications and supplements including opioid prescriptions. Functional ability and status Nutritional status Physical activity Advanced directives List of other physicians Hospitalizations, surgeries, and ER visits in previous 12 months Vitals Screenings to include cognitive, depression, and falls Referrals and appointments  No orders of the defined types were placed in this encounter.  In addition, I have reviewed and discussed with patient certain preventive protocols, quality metrics, and best practice recommendations. A written personalized care plan for preventive services as well as general  preventive health recommendations were provided to patient.   Richerd ONEIDA Lemmings, CMA   09/02/2024   Return in 1 year (on 09/02/2025).  After Visit Summary: (MyChart) Due to this being a telephonic visit, the after visit summary with patients personalized plan was offered to patient via MyChart   Nurse Notes: Richerd Lemmings, CMA

## 2024-09-02 NOTE — Patient Instructions (Addendum)
 I connected with  Brittany Crawford on 09/02/24 by a in person enabled telemedicine application and verified that I am speaking with the correct person using two identifiers.  Patient Location: Other:  office  Provider Location: Office/Clinic  Persons Participating in Visit: Patient.  I discussed the limitations of evaluation and management by telemedicine. The patient expressed understanding and agreed to proceed.   Vital Signs: Because this visit was a virtual/telehealth visit, some criteria may be missing or patient reported. Any vitals not documented were not able to be obtained and vitals that have been documented are patient reported.

## 2024-09-07 ENCOUNTER — Ambulatory Visit: Payer: 59

## 2024-09-07 ENCOUNTER — Ambulatory Visit: Payer: Self-pay | Admitting: Nurse Practitioner

## 2024-09-12 ENCOUNTER — Encounter: Payer: Self-pay | Admitting: Nurse Practitioner

## 2024-09-12 ENCOUNTER — Other Ambulatory Visit: Payer: Self-pay | Admitting: Nurse Practitioner

## 2024-09-12 ENCOUNTER — Ambulatory Visit: Payer: Self-pay | Admitting: Nurse Practitioner

## 2024-09-12 VITALS — BP 120/70 | HR 78 | Temp 99.0°F | Ht 67.0 in | Wt 202.2 lb

## 2024-09-12 DIAGNOSIS — Z79899 Other long term (current) drug therapy: Secondary | ICD-10-CM

## 2024-09-12 DIAGNOSIS — E2839 Other primary ovarian failure: Secondary | ICD-10-CM

## 2024-09-12 DIAGNOSIS — Z0000 Encounter for general adult medical examination without abnormal findings: Secondary | ICD-10-CM | POA: Diagnosis not present

## 2024-09-12 DIAGNOSIS — N1831 Chronic kidney disease, stage 3a: Secondary | ICD-10-CM

## 2024-09-12 DIAGNOSIS — I129 Hypertensive chronic kidney disease with stage 1 through stage 4 chronic kidney disease, or unspecified chronic kidney disease: Secondary | ICD-10-CM | POA: Diagnosis not present

## 2024-09-12 DIAGNOSIS — E1122 Type 2 diabetes mellitus with diabetic chronic kidney disease: Secondary | ICD-10-CM

## 2024-09-12 DIAGNOSIS — E782 Mixed hyperlipidemia: Secondary | ICD-10-CM | POA: Diagnosis not present

## 2024-09-12 DIAGNOSIS — E559 Vitamin D deficiency, unspecified: Secondary | ICD-10-CM

## 2024-09-12 DIAGNOSIS — L309 Dermatitis, unspecified: Secondary | ICD-10-CM

## 2024-09-12 LAB — POCT URINALYSIS DIP (CLINITEK)
Bilirubin, UA: NEGATIVE
Glucose, UA: >=1000 mg/dL — AB
Ketones, POC UA: NEGATIVE mg/dL
Nitrite, UA: NEGATIVE
POC PROTEIN,UA: NEGATIVE
Spec Grav, UA: 1.02 (ref 1.010–1.025)
Urobilinogen, UA: 0.2 U/dL
pH, UA: 7.5 (ref 5.0–8.0)

## 2024-09-12 MED ORDER — PRAVASTATIN SODIUM 20 MG PO TABS: ORAL_TABLET | ORAL | 1 refills | Status: AC

## 2024-09-12 NOTE — Progress Notes (Unsigned)
 LILLETTE Kristeen JINNY Gladis, CMA,acting as a neurosurgeon for Brittany Ada, FNP.,have documented all relevant documentation on the behalf of Brittany Ada, FNP,as directed by  Brittany Ada, FNP while in the presence of Brittany Ada, FNP.  Subjective:    Patient ID: Brittany Crawford , female    DOB: [birth year] , 68 y.o.   MRN: [id number]  Chief Complaint  Patient presents with   Annual Exam    Patient presents today for HM, Patient reports compliance with medication. Patient denies any chest pain, SOB, or headaches. Patient has no concerns today.     Hypertension This is a chronic problem. The current episode started more than 1 year ago. The problem is unchanged. The problem is controlled. Pertinent negatives include no anxiety, chest pain, headaches or palpitations. There are no associated agents to hypertension. There are no known risk factors for coronary artery disease. Past treatments include calcium  channel blockers and angiotensin blockers. The current treatment provides no improvement. There are no compliance problems.  Hypertensive end-organ damage includes kidney disease and CVA. There is no history of angina. Identifiable causes of hypertension include chronic renal disease.  Diabetes She presents for her follow-up diabetic visit. She has type 2 diabetes mellitus. Her disease course has been stable. Pertinent negatives for hypoglycemia include no headaches. Pertinent negatives for diabetes include no chest pain. Diabetic complications include a CVA and nephropathy. Risk factors for coronary artery disease include obesity, post-menopausal, diabetes mellitus and hypertension. Current diabetic treatment includes oral agent (monotherapy). She is compliant with treatment all of the time. She is following a generally healthy diet. She has not had a previous visit with a dietitian. She participates in exercise intermittently. (Does not check regularly) An ACE inhibitor/angiotensin II receptor blocker is being  taken. She does not see a podiatrist.Eye exam is current.    Discussed the use of AI scribe software for clinical note transcription with the patient, who gave verbal consent to proceed.  History of Present Illness Brittany Crawford is a 68 year old female with type 2 diabetes who presents for a routine follow-up visit.  Three weeks ago, she visited the emergency room due to throat irritation after swallowing popcorn. She was prescribed a five-day course of prednisone  and was referred to an ear, nose, and throat specialist.  She exercises on average three days a week and is attempting to increase her activity level, though she finds it challenging to maintain consistency. Her diet includes fruits and vegetables, but she is trying to reduce her intake of sweets and candy. She notes that food tastes unusual to her now.  She experiences occasional constipation but no diarrhea, and she uses a laxative sparingly. No swelling in her feet unless wearing tight socks. She declined the flu vaccine this year due to feeling unwell for three days after a previous vaccination.  She has eczema in her ears and uses a cream sparingly to manage itching. She takes her medications regularly, including pravastatin , and organizes them in pill containers to ensure compliance. She is on a 90-day supply for her medications. She checks her feet regularly for sores or open areas.  Past Medical History:  Diagnosis Date   Chronic back pain    H/O sarcoidosis    Hypertension    Seasonal allergies    Stroke [facility])      Family History  Problem Relation Age of Onset   Stroke Mother    Colon cancer Maternal Uncle    Breast cancer Neg Hx  Current Outpatient Medications:    Ascorbic Acid (VITAMIN C) 1000 MG tablet, Take 1,000 mg by mouth daily. Takes when coming down with a cold., Disp: , Rfl:    azelastine  (OPTIVAR ) 0.05 % ophthalmic solution, SMARTSIG:1 Drop(s) In Eye(s) Twice Daily PRN, Disp: , Rfl:     bisacodyl  5 MG EC tablet, Take 1 tablet (5 mg total) by mouth daily as needed for moderate constipation., Disp: 30 tablet, Rfl: 1   Blood Glucose Monitoring Suppl (ACCU-CHEK GUIDE) w/Device KIT, TEST UPTO 4 TIMES DAILY AS DIRECTED, Disp: 1 kit, Rfl: 1   diclofenac  sodium (VOLTAREN ) 1 % GEL, Apply 2 g topically 4 (four) times daily., Disp: 300 g, Rfl: 2   FARXIGA  10 MG TABS tablet, TAKE 1 TABLET(10 MG) BY MOUTH DAILY BEFORE BREAKFAST, Disp: 90 tablet, Rfl: 2   fluconazole  (DIFLUCAN ) 100 MG tablet, Take 1 tablet (100 mg total) by mouth daily. Take 1 tablet by mouth now repeat in 5 days, Disp: 2 tablet, Rfl: 0   fluocinolone  0.01 % cream, Apply topically 2 (two) times daily. Small amount inside ears for one week, then PRN ear itch, Disp: 30 g, Rfl: 0   HYDROcodone -acetaminophen  (NORCO) 7.5-325 MG tablet, Take 1 tablet by mouth every 8 (eight) hours as needed., Disp: 90 tablet, Rfl: 0   latanoprost (XALATAN) 0.005 % ophthalmic solution, 1 drop at bedtime., Disp: , Rfl:    losartan -hydrochlorothiazide  (HYZAAR) 100-12.5 MG tablet, TAKE 1 TABLET BY MOUTH DAILY, Disp: 90 tablet, Rfl: 1   Multiple Vitamin (MULTIVITAMIN WITH MINERALS) TABS tablet, Take 1 tablet by mouth daily., Disp: , Rfl:    Olopatadine HCl 0.2 % SOLN, Apply 1 drop to eye daily as needed for allergies., Disp: , Rfl: 3   triamcinolone  cream (KENALOG ) 0.1 %, Apply 1 Application topically at bedtime as needed (for rash)., Disp: 30 g, Rfl: 1   pravastatin  (PRAVACHOL ) 20 MG tablet, TAKE 1 TABLET BY MOUTH EVERY EVENING. ( 20MG )., Disp: 90 tablet, Rfl: 1   Vitamin D , Ergocalciferol , (DRISDOL ) 1.25 MG (50000 UNIT) CAPS capsule, Take 1 capsule (50,000 Units total) by mouth 2 (two) times a week., Disp: 24 capsule, Rfl: 1   Allergies  Allergen Reactions   Gadobenate Nausea And Vomiting    Pt was fine after a few minutes , vomiting after contrast injection smills    Ivp Dye [Iodinated Contrast Media]       The patient states she uses post  menopausal status for birth control. Patient's last menstrual period was 11/13/2012.. Negative for Dysmenorrhea and Negative for Menorrhagia. Negative for: breast discharge, breast lump(s), breast pain and breast self exam. Associated symptoms include abnormal vaginal bleeding. Pertinent negatives include abnormal bleeding (hematology), anxiety, decreased libido, depression, difficulty falling sleep, dyspareunia, history of infertility, nocturia, sexual dysfunction, sleep disturbances, urinary incontinence, urinary urgency, vaginal discharge and vaginal itching. Diet regular; admits it can be better; does eat fruits and vegetables; has challenges with sweets.The patient states her exercise level is moderate with 3 days a week. Difficult to do daily.   The patient's tobacco use is:  Social History   Tobacco Use  Smoking Status Former   Current packs/day: 0.00   Types: Cigarettes   Quit date: 03/13/2017   Years since quitting: 7.5   Passive exposure: Never  Smokeless Tobacco Never  Tobacco Comments   4  to 5  cigarettes daily for years  She has been exposed to passive smoke. The patient's alcohol  use is:  Social History   Substance and Sexual Activity  Alcohol  Use Not Currently   Comment: occassionally    Review of Systems  Constitutional: Negative.   HENT: Negative.    Eyes: Negative.   Respiratory: Negative.    Cardiovascular: Negative.  Negative for chest pain and palpitations.  Gastrointestinal: Negative.   Endocrine: Negative.   Genitourinary: Negative.   Musculoskeletal: Negative.   Skin: Negative.   Allergic/Immunologic: Negative.   Neurological: Negative.  Negative for headaches.  Hematological: Negative.   Psychiatric/Behavioral: Negative.       Today's Vitals   09/12/24 1445  BP: 120/70  Pulse: 78  Temp: 99 F (37.2 C)  TempSrc: Oral  Weight: 202 lb 3.2 oz (91.7 kg)  Height: 5' 7 (1.702 m)  PainSc: 0-No pain   Body mass index is 31.67 kg/m.  Wt Readings  from Last 3 Encounters:  09/12/24 202 lb 3.2 oz (91.7 kg)  09/02/24 204 lb (92.5 kg)  08/24/24 198 lb (89.8 kg)     Objective:  Physical Exam Vitals and nursing note reviewed. Exam conducted with a chaperone present.  Constitutional:      General: She is not in acute distress.    Appearance: Normal appearance. She is well-developed. She is obese.  HENT:     Head: Normocephalic and atraumatic.     Right Ear: Hearing, tympanic membrane, ear canal and external ear normal. There is no impacted cerumen.     Left Ear: Hearing, tympanic membrane, ear canal and external ear normal. There is no impacted cerumen.     Nose: Nose normal.     Mouth/Throat:     Mouth: Mucous membranes are moist.  Eyes:     General: Lids are normal.     Extraocular Movements: Extraocular movements intact.     Conjunctiva/sclera: Conjunctivae normal.     Pupils: Pupils are equal, round, and reactive to light.     Funduscopic exam:    Right eye: No papilledema.        Left eye: No papilledema.  Neck:     Thyroid : No thyroid  mass.     Vascular: No carotid bruit.  Cardiovascular:     Rate and Rhythm: Normal rate and regular rhythm.     Pulses: Normal pulses.     Heart sounds: Normal heart sounds. No murmur heard. Pulmonary:     Effort: Pulmonary effort is normal. No respiratory distress.     Breath sounds: Normal breath sounds. No wheezing.  Abdominal:     General: Bowel sounds are normal. There is no distension.     Palpations: Abdomen is soft.     Tenderness: There is no abdominal tenderness.     Comments: round  Genitourinary:    General: Normal vulva.     Tanner stage (genital): 5.     Labia:        Right: No rash or tenderness.        Left: No rash or tenderness.      Urethra: No prolapse.     Vagina: Normal.     Cervix: Normal.     Uterus: Normal.      Adnexa: Right adnexa normal and left adnexa normal.     Rectum: Normal. Guaiac result negative.  Musculoskeletal:        General: No swelling  or tenderness. Normal range of motion.     Cervical back: Full passive range of motion without pain, normal range of motion and neck supple.  Skin:    General: Skin is warm and dry.  Capillary Refill: Capillary refill takes less than 2 seconds.     Coloration: Skin is not jaundiced.     Findings: No bruising.  Neurological:     Mental Status: She is alert and oriented to person, place, and time.     Cranial Nerves: No cranial nerve deficit.     Sensory: No sensory deficit.     Motor: No weakness.  Psychiatric:        Behavior: Behavior normal.        Thought Content: Thought content normal.        Judgment: Judgment normal.     Assessment And Plan:     Encounter for annual health examination Assessment & Plan: Routine wellness visit with emphasis on exercise and diet. Discussed bone density screening for osteoporosis. - Ordered bone density scan at [facility] imaging center. - Encouraged 150 minutes of exercise per week. - Advised monitoring of sweets, carbs, breads, pastas, rice, and dough.   Type 2 diabetes mellitus with stage 3a chronic kidney disease, without long-term current use of insulin (HCC) Assessment & Plan: Blood pressure controlled at 120/70 mmHg. Weight slightly elevated. Emphasized blood glucose monitoring and dietary modifications. A1c was 6.3 at last visit. - Checked hemoglobin A1c and cholesterol levels. - Encouraged regular blood glucose monitoring. - Advised dietary modifications to manage weight and blood glucose levels.  Orders: -     EKG 12-Lead -     POCT URINALYSIS DIP (CLINITEK) -     Microalbumin / creatinine urine ratio -     CMP14+EGFR -     Hemoglobin A1c  Hypertensive nephropathy Assessment & Plan: Blood pressure is well controlled. Continue current medications.   Mixed hyperlipidemia -     Lipid panel -     Pravastatin  Sodium; TAKE 1 TABLET BY MOUTH EVERY EVENING. ( 20MG ).  Dispense: 90 tablet; Refill: 1  Vitamin D   deficiency Assessment & Plan: Will check vitamin D  level and supplement as needed.    Also encouraged to spend 15 minutes in the sun daily.    Orders: -     VITAMIN D  25 Hydroxy (Vit-D Deficiency, Fractures)  Other long term (current) drug therapy -     CBC with Differential/Platelet  Decreased estrogen level -     DG Bone Density; Future  Eczema, unspecified type Assessment & Plan: Reports dry, itchy ears with eczema. Uses cream as needed. - Continue using cream as needed for eczema.      Return for 1 year physical, controlled DM check 4 months. Patient was given opportunity to ask questions. Patient verbalized understanding of the plan and was able to repeat key elements of the plan. All questions were answered to their satisfaction.   Brittany Ada, FNP  I, Brittany Ada, FNP, have reviewed all documentation for this visit. The documentation on 09/12/24 for the exam, diagnosis, procedures, and orders are all accurate and complete.

## 2024-09-13 ENCOUNTER — Ambulatory Visit: Payer: Self-pay | Admitting: Nurse Practitioner

## 2024-09-13 DIAGNOSIS — E559 Vitamin D deficiency, unspecified: Secondary | ICD-10-CM

## 2024-09-13 LAB — CBC WITH DIFFERENTIAL/PLATELET
Basophils Absolute: 0 x10E3/uL (ref 0.0–0.2)
Basos: 1 %
EOS (ABSOLUTE): 0.2 x10E3/uL (ref 0.0–0.4)
Eos: 3 %
Hematocrit: 42.4 % (ref 34.0–46.6)
Hemoglobin: 14.1 g/dL (ref 11.1–15.9)
Immature Grans (Abs): 0 x10E3/uL (ref 0.0–0.1)
Immature Granulocytes: 0 %
Lymphocytes Absolute: 3 x10E3/uL (ref 0.7–3.1)
Lymphs: 49 %
MCH: 29.5 pg (ref 26.6–33.0)
MCHC: 33.3 g/dL (ref 31.5–35.7)
MCV: 89 fL (ref 79–97)
Monocytes Absolute: 0.7 x10E3/uL (ref 0.1–0.9)
Monocytes: 11 %
Neutrophils Absolute: 2.2 x10E3/uL (ref 1.4–7.0)
Neutrophils: 36 %
Platelets: 285 x10E3/uL (ref 150–450)
RBC: 4.78 x10E6/uL (ref 3.77–5.28)
RDW: 14.1 % (ref 11.7–15.4)
WBC: 6 x10E3/uL (ref 3.4–10.8)

## 2024-09-13 LAB — CMP14+EGFR
ALT: 10 IU/L (ref 0–32)
AST: 13 IU/L (ref 0–40)
Albumin: 4.6 g/dL (ref 3.9–4.9)
Alkaline Phosphatase: 60 IU/L (ref 49–135)
BUN/Creatinine Ratio: 11 — ABNORMAL LOW (ref 12–28)
BUN: 12 mg/dL (ref 8–27)
Bilirubin Total: 0.8 mg/dL (ref 0.0–1.2)
CO2: 23 mmol/L (ref 20–29)
Calcium: 10 mg/dL (ref 8.7–10.3)
Chloride: 104 mmol/L (ref 96–106)
Creatinine, Ser: 1.13 mg/dL — ABNORMAL HIGH (ref 0.57–1.00)
Globulin, Total: 2.7 g/dL (ref 1.5–4.5)
Glucose: 106 mg/dL — ABNORMAL HIGH (ref 70–99)
Potassium: 3.6 mmol/L (ref 3.5–5.2)
Sodium: 143 mmol/L (ref 134–144)
Total Protein: 7.3 g/dL (ref 6.0–8.5)
eGFR: 54 mL/min/1.73 — ABNORMAL LOW (ref >59)

## 2024-09-13 LAB — SPECIMEN STATUS REPORT

## 2024-09-13 LAB — LIPID PANEL
Chol/HDL Ratio: 3.2 ratio (ref 0.0–4.4)
Cholesterol, Total: 175 mg/dL (ref 100–199)
HDL: 54 mg/dL (ref >39)
LDL Chol Calc (NIH): 84 mg/dL (ref 0–99)
Triglycerides: 224 mg/dL — ABNORMAL HIGH (ref 0–149)
VLDL Cholesterol Cal: 37 mg/dL (ref 5–40)

## 2024-09-13 LAB — VITAMIN D 25 HYDROXY (VIT D DEFICIENCY, FRACTURES): Vit D, 25-Hydroxy: 13.9 ng/mL — ABNORMAL LOW (ref 30.0–100.0)

## 2024-09-13 LAB — MICROALBUMIN / CREATININE URINE RATIO
Creatinine, Urine: 61 mg/dL
Microalb/Creat Ratio: 11 mg/g{creat} (ref 0–29)
Microalbumin, Urine: 6.8 ug/mL

## 2024-09-13 LAB — HEMOGLOBIN A1C
Est. average glucose Bld gHb Est-mCnc: 131 mg/dL
Hgb A1c MFr Bld: 6.2 % — ABNORMAL HIGH (ref 4.8–5.6)

## 2024-09-13 MED ORDER — VITAMIN D (ERGOCALCIFEROL) 1.25 MG (50000 UNIT) PO CAPS: 50000.0000 [IU] | ORAL_CAPSULE | ORAL | 1 refills | Status: AC

## 2024-09-18 DIAGNOSIS — L309 Dermatitis, unspecified: Secondary | ICD-10-CM | POA: Insufficient documentation

## 2024-09-18 NOTE — Assessment & Plan Note (Signed)
 Blood pressure is well controlled. Continue current medications.

## 2024-09-18 NOTE — Assessment & Plan Note (Signed)
 Will check vitamin D  level and supplement as needed.    Also encouraged to spend 15 minutes in the sun daily.

## 2024-09-18 NOTE — Assessment & Plan Note (Signed)
 Blood pressure controlled at 120/70 mmHg. Weight slightly elevated. Emphasized blood glucose monitoring and dietary modifications. A1c was 6.3 at last visit. - Checked hemoglobin A1c and cholesterol levels. - Encouraged regular blood glucose monitoring. - Advised dietary modifications to manage weight and blood glucose levels.

## 2024-09-18 NOTE — Assessment & Plan Note (Signed)
 Reports dry, itchy ears with eczema. Uses cream as needed. - Continue using cream as needed for eczema.

## 2024-09-18 NOTE — Assessment & Plan Note (Signed)
 Routine wellness visit with emphasis on exercise and diet. Discussed bone density screening for osteoporosis. - Ordered bone density scan at [facility] imaging center. - Encouraged 150 minutes of exercise per week. - Advised monitoring of sweets, carbs, breads, pastas, rice, and dough.

## 2024-10-11 ENCOUNTER — Encounter: Attending: Registered Nurse | Admitting: Registered Nurse

## 2024-10-11 ENCOUNTER — Encounter: Payer: Self-pay | Admitting: Registered Nurse

## 2024-10-11 VITALS — BP 113/77 | HR 87 | Ht 67.0 in | Wt 201.0 lb

## 2024-10-11 DIAGNOSIS — Z79891 Long term (current) use of opiate analgesic: Secondary | ICD-10-CM | POA: Insufficient documentation

## 2024-10-11 DIAGNOSIS — G894 Chronic pain syndrome: Secondary | ICD-10-CM | POA: Insufficient documentation

## 2024-10-11 DIAGNOSIS — G8929 Other chronic pain: Secondary | ICD-10-CM | POA: Diagnosis present

## 2024-10-11 DIAGNOSIS — M2550 Pain in unspecified joint: Secondary | ICD-10-CM | POA: Insufficient documentation

## 2024-10-11 DIAGNOSIS — M25561 Pain in right knee: Secondary | ICD-10-CM | POA: Insufficient documentation

## 2024-10-11 DIAGNOSIS — M47816 Spondylosis without myelopathy or radiculopathy, lumbar region: Secondary | ICD-10-CM | POA: Insufficient documentation

## 2024-10-11 DIAGNOSIS — Z5181 Encounter for therapeutic drug level monitoring: Secondary | ICD-10-CM | POA: Insufficient documentation

## 2024-10-11 MED ORDER — HYDROCODONE-ACETAMINOPHEN 7.5-325 MG PO TABS: 1.0000 | ORAL_TABLET | Freq: Three times a day (TID) | ORAL | 0 refills | Status: DC | PRN

## 2024-10-11 MED ORDER — HYDROCODONE-ACETAMINOPHEN 7.5-325 MG PO TABS: 1.0000 | ORAL_TABLET | Freq: Three times a day (TID) | ORAL | 0 refills | Status: AC | PRN

## 2024-10-11 NOTE — Progress Notes (Signed)
 "  Subjective:    Patient ID: Brittany Crawford, female    DOB: 1955-12-15, 68 y.o.   MRN: [id number]  HPI: Brittany Crawford is a 68 y.o. female who returns for follow up appointment for chronic pain and medication refill. She states her pain is located in her lower back and right knee pain. She rates her pain 8. Her current exercise regime is walking and performing stretching exercises.  Brittany Crawford equivalent is 22.50 MME.   Oral Swab was Performed today.      Pain Inventory Average Pain 7 Pain Right Now 8 My pain is intermittent, sharp, and aching  In the last 24 hours, has pain interfered with the following? General activity 8 Relation with others 9 Enjoyment of life 10 What TIME of day is your pain at its worst? morning  and evening Sleep (in general) Fair  Pain is worse with: bending and standing Pain improves with: medication Relief from Meds: good  Family History  Problem Relation Age of Onset   Stroke Mother    Colon cancer Maternal Uncle    Breast cancer Neg Hx    Social History   Socioeconomic History   Marital status: Married    Spouse name: Not on file   Number of children: 2   Years of education: 12   Highest education level: Not on file  Occupational History   Occupation: disability  Tobacco Use   Smoking status: Former    Current packs/day: 0.00    Types: Cigarettes    Quit date: 03/13/2017    Years since quitting: 7.5    Passive exposure: Never   Smokeless tobacco: Never   Tobacco comments:    4  to 5  cigarettes daily for years  Vaping Use   Vaping status: Never Used  Substance and Sexual Activity   Alcohol  use: Not Currently    Comment: occassionally   Drug use: Yes    Types: Hydrocodone    Sexual activity: Not Currently  Other Topics Concern   Not on file  Social History Narrative   Patient is married with 2 children.   Patient is right handed.   Patient has hs education.   Patient drinks 4 cups daily.   Social Drivers of  Health   Tobacco Use: Medium Risk (09/12/2024)   Patient History    Smoking Tobacco Use: Former    Smokeless Tobacco Use: Never    Passive Exposure: Never  Physicist, Medical Strain: Low Risk (08/19/2023)   Overall Financial Resource Strain (CARDIA)    Difficulty of Paying Living Expenses: Not hard at all  Food Insecurity: No Food Insecurity (09/02/2024)   Epic    Worried About Programme Researcher, Broadcasting/film/video in the Last Year: Never true    Ran Out of Food in the Last Year: Never true  Transportation Needs: No Transportation Needs (09/02/2024)   Epic    Lack of Transportation (Medical): No    Lack of Transportation (Non-Medical): No  Physical Activity: Insufficiently Active (09/02/2024)   Exercise Vital Sign    Days of Exercise per Week: 3 days    Minutes of Exercise per Session: 20 min  Stress: No Stress Concern Present (09/02/2024)   [employer] of Occupational Health - Occupational Stress Questionnaire    Feeling of Stress: Not at all  Social Connections: Moderately Integrated (09/02/2024)   Social Connection and Isolation Panel    Frequency of Communication with Friends and Family: Twice a week    Frequency of  Social Gatherings with Friends and Family: Once a week    Attends Religious Services: More than 4 times per year    Active Member of Clubs or Organizations: No    Attends Banker Meetings: Never    Marital Status: Married  Depression (PHQ2-9): Low Risk (09/02/2024)   Depression (PHQ2-9)    PHQ-2 Score: 0  Alcohol  Screen: Low Risk (08/19/2023)   Alcohol  Screen    Last Alcohol  Screening Score (AUDIT): 0  Housing: Unknown (09/02/2024)   Epic    Unable to Pay for Housing in the Last Year: No    Number of Times Moved in the Last Year: Not on file    Homeless in the Last Year: No  Utilities: Not At Risk (09/02/2024)   Epic    Threatened with loss of utilities: No  Health Literacy: Adequate Health Literacy (09/02/2024)   B1300 Health Literacy    Frequency of  need for help with medical instructions: Never   Past Surgical History:  Procedure Laterality Date   BREAST BIOPSY Left 04/29/2016   BREAST BIOPSY Left 04/23/2016   COLONOSCOPY     DILATATION & CURRETTAGE/HYSTEROSCOPY WITH RESECTOCOPE N/A 12/10/2012   Procedure: DILATATION & CURETTAGE/HYSTEROSCOPY WITH RESECTOCOPE;  Surgeon: Dickie DELENA Carder, MD;  Location: [facility];  Service: Gynecology;  Laterality: N/A;   FOOT SURGERY     left-pins placed   LYMPH NODE BIOPSY     POLYPECTOMY N/A 12/10/2012   Procedure: POLYPECTOMY;  Surgeon: Dickie DELENA Carder, MD;  Location: [facility];  Service: Gynecology;  Laterality: N/A;   STRABISMUS SURGERY Left 04/17/2017   Procedure: REPAIR STRABISMUS LEFT EYE;  Surgeon: Neysa Fallow, MD;  Location: [facility];  Service: Ophthalmology;  Laterality: Left;   Past Surgical History:  Procedure Laterality Date   BREAST BIOPSY Left 04/29/2016   BREAST BIOPSY Left 04/23/2016   COLONOSCOPY     DILATATION & CURRETTAGE/HYSTEROSCOPY WITH RESECTOCOPE N/A 12/10/2012   Procedure: DILATATION & CURETTAGE/HYSTEROSCOPY WITH RESECTOCOPE;  Surgeon: Dickie DELENA Carder, MD;  Location: [facility];  Service: Gynecology;  Laterality: N/A;   FOOT SURGERY     left-pins placed   LYMPH NODE BIOPSY     POLYPECTOMY N/A 12/10/2012   Procedure: POLYPECTOMY;  Surgeon: Dickie DELENA Carder, MD;  Location: [facility];  Service: Gynecology;  Laterality: N/A;   STRABISMUS SURGERY Left 04/17/2017   Procedure: REPAIR STRABISMUS LEFT EYE;  Surgeon: Neysa Fallow, MD;  Location: [facility];  Service: Ophthalmology;  Laterality: Left;   Past Medical History:  Diagnosis Date   Chronic back pain    H/O sarcoidosis    Hypertension    Seasonal allergies    Stroke (HCC)    LMP 11/13/2012   Opioid Risk Score:   Fall Risk Score:  `1  Depression screen [facility] 2/9     09/02/2024    9:11 AM 08/09/2024    9:58 AM 08/19/2023    9:04 AM 08/11/2023   10:34 AM 02/05/2023     9:41 AM 01/22/2023    9:04 AM 12/11/2022    9:31 AM  Depression screen PHQ 2/9  Decreased Interest 0 0 0 0 0 0 0  Down, Depressed, Hopeless 0 0 0 0 0 0 0  PHQ - 2 Score 0 0 0 0 0 0 0  Altered sleeping   0      Tired, decreased energy   0      Change in appetite   0  Feeling bad or failure about yourself    0      Trouble concentrating   0      Moving slowly or fidgety/restless   0      Suicidal thoughts   0      PHQ-9 Score   0       Difficult doing work/chores   Not difficult at all         Data saved with a previous flowsheet row definition    Review of Systems  Musculoskeletal:  Positive for back pain.       Pain in the right knee  All other systems reviewed and are negative.      Objective:   Physical Exam Vitals and nursing note reviewed.  Constitutional:      Appearance: Normal appearance.  Cardiovascular:     Rate and Rhythm: Normal rate and regular rhythm.     Pulses: Normal pulses.     Heart sounds: Normal heart sounds.  Pulmonary:     Effort: Pulmonary effort is normal.     Breath sounds: Normal breath sounds.  Musculoskeletal:     Comments: Normal Muscle Bulk and Muscle Testing Reveals:  Upper Extremities: Full ROM and Muscle Strength 5/5  Lumbar Paraspinal Tenderness: L-4-L-5 Lower Extremities: Full ROM and Muscle Strength 5/5 Right Lower Extremity Flexion Produces Pain into her Right Patella Arises from Table slowly  Narrow Based  Gait     Skin:    General: Skin is warm and dry.  Neurological:     Mental Status: She is alert and oriented to person, place, and time.  Psychiatric:        Mood and Affect: Mood normal.        Behavior: Behavior normal.          Assessment & Plan:  . Lumbar spondylosis with DDD and facet arthropathy.Left Lumbar Radiculitis: Continue HEP as Tolerated. Continue to Monitor.  10/11/2024 Refilled: Hydrocodone  7.5 /325 mg one tablet every 8 hours as needed for pain.  #90 tablets. . Second script e-scribe for the  following month. We will continue the opioid monitoring program, this consists of regular clinic visits, examinations, urine drug screen, pill counts as well as use of [place]  Controlled Substance Reporting system. A 12 month History has been reviewed on the [place]  Controlled Substance Reporting System on 10/11/2024. 2. Chronic Right  knee pain:. Continue with heat/ice, exercise and Diclofenac . 10/11/2024 3. Right thalamic/internal capsule lacunar infarct with persistent left hemisensory deficits: Neurology Following. Continue to Monitor. 10/11/2024. 4.Left Greater Trochanteric Bursitis: No complaints today. Continue HEP as Tolerated and Alternate Ice and Heat Therapy. No complaints today. Continue current medication regime. Continue to Monitor.  10/11/2024. 5. Polyarthralgia: Continue HEP as Tolerated. Continue to monitor. 10/11/2024 6. Cervicalgia: No complaints Today. Continue to alternate with heat and Ice therapy. Continue to Monitor. 10/11/2024 7. Right Shoulder Pain:  No complaints today. 10/11/2024   F/U in 2 months      "

## 2024-10-14 LAB — DRUG TOX MONITOR 1 W/CONF, ORAL FLD
Amphetamines: NEGATIVE ng/mL
Barbiturates: NEGATIVE ng/mL
Benzodiazepines: NEGATIVE ng/mL
Buprenorphine: NEGATIVE ng/mL
Cocaine: NEGATIVE ng/mL
Codeine: NEGATIVE ng/mL
Dihydrocodeine: NEGATIVE ng/mL
Fentanyl: NEGATIVE ng/mL
Heroin Metabolite: NEGATIVE ng/mL
Hydrocodone: 31.7 ng/mL — ABNORMAL HIGH
Hydromorphone: NEGATIVE ng/mL
MARIJUANA: NEGATIVE ng/mL
MDMA: NEGATIVE ng/mL
Meprobamate: NEGATIVE ng/mL
Methadone: NEGATIVE ng/mL
Morphine: NEGATIVE ng/mL
Nicotine Metabolite: NEGATIVE ng/mL
Norhydrocodone: NEGATIVE ng/mL
Noroxycodone: NEGATIVE ng/mL
Opiates: POSITIVE ng/mL — AB
Oxycodone: NEGATIVE ng/mL
Oxymorphone: NEGATIVE ng/mL
Phencyclidine: NEGATIVE ng/mL
Tapentadol: NEGATIVE ng/mL
Tramadol: NEGATIVE ng/mL
Zolpidem: NEGATIVE ng/mL

## 2024-10-14 LAB — DRUG TOX ALC METAB W/CON, ORAL FLD: Alcohol Metabolite: NEGATIVE ng/mL

## 2024-11-11 LAB — HM DEXA SCAN: HM Dexa Scan: NORMAL

## 2024-11-15 ENCOUNTER — Encounter: Payer: Self-pay | Admitting: Nurse Practitioner

## 2024-12-09 ENCOUNTER — Encounter: Admitting: Registered Nurse

## 2025-01-11 ENCOUNTER — Ambulatory Visit: Admitting: Nurse Practitioner

## 2025-09-13 ENCOUNTER — Encounter: Payer: Self-pay | Admitting: Nurse Practitioner

## 2025-09-27 ENCOUNTER — Ambulatory Visit
